# Patient Record
Sex: Male | Born: 1965 | Race: Black or African American | Hispanic: No | Marital: Married | Smoking: Current every day smoker
Health system: Southern US, Community
[De-identification: ages and names within clinical notes are randomized; demographics above are authoritative.]

## PROBLEM LIST (undated history)

## (undated) DIAGNOSIS — J189 Pneumonia, unspecified organism: Secondary | ICD-10-CM

## (undated) DIAGNOSIS — F1010 Alcohol abuse, uncomplicated: Secondary | ICD-10-CM

## (undated) DIAGNOSIS — J449 Chronic obstructive pulmonary disease, unspecified: Secondary | ICD-10-CM

## (undated) DIAGNOSIS — E079 Disorder of thyroid, unspecified: Secondary | ICD-10-CM

## (undated) DIAGNOSIS — J942 Hemothorax: Secondary | ICD-10-CM

## (undated) DIAGNOSIS — I10 Essential (primary) hypertension: Secondary | ICD-10-CM

## (undated) DIAGNOSIS — I639 Cerebral infarction, unspecified: Secondary | ICD-10-CM

## (undated) DIAGNOSIS — I619 Nontraumatic intracerebral hemorrhage, unspecified: Secondary | ICD-10-CM

---

## 2001-02-03 ENCOUNTER — Inpatient Hospital Stay (HOSPITAL_COMMUNITY): Admission: EM | Admit: 2001-02-03 | Discharge: 2001-03-31 | Payer: Self-pay | Admitting: Emergency Medicine

## 2001-02-04 ENCOUNTER — Encounter: Payer: Self-pay | Admitting: General Surgery

## 2001-02-05 ENCOUNTER — Encounter: Payer: Self-pay | Admitting: Internal Medicine

## 2001-02-07 ENCOUNTER — Encounter: Payer: Self-pay | Admitting: General Surgery

## 2001-02-09 ENCOUNTER — Encounter: Payer: Self-pay | Admitting: General Surgery

## 2001-02-10 ENCOUNTER — Encounter: Payer: Self-pay | Admitting: Internal Medicine

## 2001-02-10 ENCOUNTER — Encounter: Payer: Self-pay | Admitting: Family Medicine

## 2001-02-12 ENCOUNTER — Encounter: Payer: Self-pay | Admitting: General Surgery

## 2001-02-16 ENCOUNTER — Encounter: Payer: Self-pay | Admitting: Internal Medicine

## 2001-02-17 ENCOUNTER — Encounter: Payer: Self-pay | Admitting: Internal Medicine

## 2001-02-18 ENCOUNTER — Encounter: Payer: Self-pay | Admitting: General Surgery

## 2001-02-19 ENCOUNTER — Encounter: Payer: Self-pay | Admitting: Family Medicine

## 2001-02-22 ENCOUNTER — Encounter: Payer: Self-pay | Admitting: Internal Medicine

## 2001-02-27 ENCOUNTER — Encounter: Payer: Self-pay | Admitting: Internal Medicine

## 2001-02-27 ENCOUNTER — Encounter: Payer: Self-pay | Admitting: Family Medicine

## 2001-02-28 ENCOUNTER — Encounter: Payer: Self-pay | Admitting: General Surgery

## 2001-03-01 ENCOUNTER — Encounter: Payer: Self-pay | Admitting: Nephrology

## 2001-03-02 ENCOUNTER — Encounter: Payer: Self-pay | Admitting: Internal Medicine

## 2001-03-03 ENCOUNTER — Encounter: Payer: Self-pay | Admitting: Family Medicine

## 2001-03-05 ENCOUNTER — Encounter: Payer: Self-pay | Admitting: General Surgery

## 2001-03-06 ENCOUNTER — Encounter: Payer: Self-pay | Admitting: General Surgery

## 2001-03-09 ENCOUNTER — Encounter: Payer: Self-pay | Admitting: Internal Medicine

## 2001-03-12 ENCOUNTER — Encounter: Payer: Self-pay | Admitting: Family Medicine

## 2001-03-16 ENCOUNTER — Encounter: Payer: Self-pay | Admitting: Internal Medicine

## 2001-03-19 ENCOUNTER — Encounter: Payer: Self-pay | Admitting: General Surgery

## 2001-03-21 ENCOUNTER — Encounter: Payer: Self-pay | Admitting: Family Medicine

## 2001-03-27 ENCOUNTER — Encounter: Payer: Self-pay | Admitting: Family Medicine

## 2001-03-31 ENCOUNTER — Encounter: Payer: Self-pay | Admitting: Pulmonary Disease

## 2001-03-31 ENCOUNTER — Encounter: Payer: Self-pay | Admitting: General Surgery

## 2001-03-31 ENCOUNTER — Inpatient Hospital Stay (HOSPITAL_COMMUNITY): Admission: EM | Admit: 2001-03-31 | Discharge: 2001-04-11 | Payer: Self-pay | Admitting: Pulmonary Disease

## 2001-04-01 ENCOUNTER — Encounter: Payer: Self-pay | Admitting: Pulmonary Disease

## 2001-04-02 ENCOUNTER — Encounter: Payer: Self-pay | Admitting: Pulmonary Disease

## 2001-04-03 ENCOUNTER — Encounter: Payer: Self-pay | Admitting: Pulmonary Disease

## 2001-04-11 ENCOUNTER — Inpatient Hospital Stay (HOSPITAL_COMMUNITY)
Admission: RE | Admit: 2001-04-11 | Discharge: 2001-04-15 | Payer: Self-pay | Admitting: Physical Medicine and Rehabilitation

## 2001-04-11 ENCOUNTER — Encounter: Payer: Self-pay | Admitting: Physical Medicine and Rehabilitation

## 2001-04-13 ENCOUNTER — Encounter: Payer: Self-pay | Admitting: Physical Medicine and Rehabilitation

## 2001-07-12 ENCOUNTER — Encounter: Payer: Self-pay | Admitting: General Surgery

## 2001-07-12 ENCOUNTER — Ambulatory Visit (HOSPITAL_COMMUNITY): Admission: RE | Admit: 2001-07-12 | Discharge: 2001-07-12 | Payer: Self-pay | Admitting: General Surgery

## 2003-08-22 ENCOUNTER — Emergency Department (HOSPITAL_COMMUNITY): Admission: EM | Admit: 2003-08-22 | Discharge: 2003-08-23 | Payer: Self-pay | Admitting: Emergency Medicine

## 2004-05-31 ENCOUNTER — Emergency Department (HOSPITAL_COMMUNITY): Admission: EM | Admit: 2004-05-31 | Discharge: 2004-05-31 | Payer: Self-pay | Admitting: *Deleted

## 2005-09-20 DIAGNOSIS — I639 Cerebral infarction, unspecified: Secondary | ICD-10-CM

## 2005-09-20 HISTORY — DX: Cerebral infarction, unspecified: I63.9

## 2006-03-30 ENCOUNTER — Emergency Department (HOSPITAL_COMMUNITY): Admission: EM | Admit: 2006-03-30 | Discharge: 2006-03-30 | Payer: Self-pay | Admitting: Emergency Medicine

## 2006-07-03 ENCOUNTER — Emergency Department (HOSPITAL_COMMUNITY): Admission: EM | Admit: 2006-07-03 | Discharge: 2006-07-03 | Payer: Self-pay | Admitting: *Deleted

## 2007-06-27 ENCOUNTER — Emergency Department (HOSPITAL_COMMUNITY): Admission: EM | Admit: 2007-06-27 | Discharge: 2007-06-28 | Payer: Self-pay | Admitting: Emergency Medicine

## 2007-08-12 ENCOUNTER — Emergency Department (HOSPITAL_COMMUNITY): Admission: EM | Admit: 2007-08-12 | Discharge: 2007-08-12 | Payer: Self-pay | Admitting: Emergency Medicine

## 2009-11-08 ENCOUNTER — Emergency Department (HOSPITAL_COMMUNITY): Admission: EM | Admit: 2009-11-08 | Discharge: 2009-11-08 | Payer: Self-pay | Admitting: Emergency Medicine

## 2010-02-06 ENCOUNTER — Emergency Department (HOSPITAL_COMMUNITY): Admission: EM | Admit: 2010-02-06 | Discharge: 2010-02-06 | Payer: Self-pay | Admitting: Emergency Medicine

## 2010-12-07 LAB — POCT CARDIAC MARKERS
CKMB, poc: 1 ng/mL (ref 1.0–8.0)
Myoglobin, poc: 33.9 ng/mL (ref 12–200)
Troponin i, poc: <0.05 ng/mL (ref 0.00–0.09)

## 2010-12-07 LAB — BASIC METABOLIC PANEL
BUN: 12 mg/dL (ref 6–23)
CO2: 20 mEq/L (ref 19–32)
Calcium: 8.7 mg/dL (ref 8.4–10.5)
Chloride: 103 mEq/L (ref 96–112)
Creatinine, Ser: 0.97 mg/dL (ref 0.4–1.5)
GFR calc Af Amer: >60 mL/min (ref >60)
GFR calc non Af Amer: >60 mL/min (ref >60)
Glucose, Bld: 99 mg/dL (ref 70–99)
Potassium: 3.9 mEq/L (ref 3.5–5.1)
Sodium: 133 mEq/L — ABNORMAL LOW (ref 135–145)

## 2010-12-07 LAB — ETHANOL: Alcohol, Ethyl (B): 284 mg/dL — ABNORMAL HIGH (ref 0–10)

## 2010-12-07 LAB — DIFFERENTIAL
Basophils Absolute: 0.2 10*3/uL — ABNORMAL HIGH (ref 0.0–0.1)
Basophils Relative: 2 % — ABNORMAL HIGH (ref 0–1)
Eosinophils Absolute: 0.2 10*3/uL (ref 0.0–0.7)
Eosinophils Relative: 2 % (ref 0–5)
Lymphocytes Relative: 58 % — ABNORMAL HIGH (ref 12–46)
Lymphs Abs: 5.1 10*3/uL — ABNORMAL HIGH (ref 0.7–4.0)
Monocytes Absolute: 0.6 10*3/uL (ref 0.1–1.0)
Monocytes Relative: 7 % (ref 3–12)
Neutro Abs: 2.7 10*3/uL (ref 1.7–7.7)
Neutrophils Relative %: 31 % — ABNORMAL LOW (ref 43–77)

## 2010-12-07 LAB — CBC
HCT: 44.9 % (ref 39.0–52.0)
Hemoglobin: 16.3 g/dL (ref 13.0–17.0)
MCHC: 36.4 g/dL — ABNORMAL HIGH (ref 30.0–36.0)
MCV: 101 fL — ABNORMAL HIGH (ref 78.0–100.0)
Platelets: 165 10*3/uL (ref 150–400)
RBC: 4.44 MIL/uL (ref 4.22–5.81)
RDW: 15.1 % (ref 11.5–15.5)
WBC: 8.8 10*3/uL (ref 4.0–10.5)

## 2011-01-11 ENCOUNTER — Emergency Department (HOSPITAL_COMMUNITY)
Admission: EM | Admit: 2011-01-11 | Discharge: 2011-01-11 | Disposition: A | Discharge status: Home / Self Care | Payer: Self-pay | Attending: Emergency Medicine | Admitting: Emergency Medicine

## 2011-01-11 ENCOUNTER — Emergency Department (HOSPITAL_COMMUNITY): Admission: EM | Admit: 2011-01-11 | Payer: Self-pay | Source: Home / Self Care

## 2011-01-11 DIAGNOSIS — R040 Epistaxis: Secondary | ICD-10-CM | POA: Insufficient documentation

## 2011-01-11 DIAGNOSIS — I10 Essential (primary) hypertension: Secondary | ICD-10-CM | POA: Insufficient documentation

## 2011-02-05 NOTE — Procedures (Signed)
NAME:  Rick Williams, Rick Williams                      ACCOUNT NO.:  [id number]   MEDICAL RECORD NO.:  [id number]                   PATIENT TYPE:  EMS   LOCATION:  ED                                   FACILITY:  [facility]   PHYSICIAN:  Edward L. Juanetta Gosling, M.D.             DATE OF BIRTH:  [birth year]   DATE OF PROCEDURE:  08/22/2003  DATE OF DISCHARGE:  08/23/2003                                EKG INTERPRETATION   FINDINGS:  1. The rhythm is sinus rhythm with probably a PVC versus artifact.  2. There is left atrial enlargement.  3. There is left ventricular hypertrophy.   IMPRESSION:  Abnormal electrocardiogram.      ___________________________________________                                            Oneal Deputy. Juanetta Gosling, M.D.   ELH/MEDQ  D:  08/23/2003  T:  08/23/2003  Job:  [id number]

## 2011-02-05 NOTE — Discharge Summary (Signed)
[place]  Patient:    Rick Williams, Rick Williams                   MRN: [id number] Adm. Date:  [id number] Disc. Date: [id number] Attending:  Silvio Pate Dictator:   Junie Bame, P.A. CC:         Rick Williams. Rick Williams, M.D. [facility]   Discharge Summary  DISCHARGE DIAGNOSES: 1. Adult respiratory distress syndrome/systemic inflammatory response    syndrome. 2. Hepatic alcoholic cirrhosis. 3. Hypertension. 4. Alcoholism. 5. Anemia. 6. Status post tracheotomy. 7. Status post methicillin resistant Staphylococcus aureus pneumonia. 8. Status post mild hyponatremia.  HISTORY OF PRESENT ILLNESS:  The patient is a 45 year old African-American male originally admitted at [facility] on Feb 03, 2001, for MRSA pneumonia.  Medical complications at [facility] include ARDS/SIRS and alcoholic hepatitis.  The patient was transferred to [facility] on March 31, 2001, for ventilation management. The patient was seen by Rick Williams, M.D., weaned to trach collar on April 06, 2001.  Hospital course at [facility] significant for anemia, insomnia, agitation, and malnutrition.  Presently, the trach has been removed and he is hemodynamically stable.  The PT report indicated that the patient was ambulating minimum assist approximately 50 feet x 2 or hand-held assist and transfer sit to stand for supervision.  Echocardiogram revealed the patient has a 35 to 45% ejection fraction.  The patient has completed a course of vancomycin for 28 days for MRSA pneumonia.  PAST MEDICAL HISTORY:  This is significant for: 1. Hepatic alcoholic cirrhosis. 2. Alcoholic cardiomegaly. 3. Hypertension.  PAST SURGICAL HISTORY:  None.  MEDICATIONS PRIOR TO ADMISSION:  None.  FAMILY HISTORY:  Noncontributory.  SOCIAL HISTORY:  The patient lives in [place], [place], in a [place] home with father.  He is unemployed.  There  is no steps to entry. There is a positive alcohol history and positive tobacco history.  PRIMARY CARE Rick Williams:  Dr. Elpidio Williams.  HOSPITAL COURSE:  The patient was admitted to rehab on April 11, 2001, at [facility] where he received more than three hours of PT and OT daily.  Rick Williams progressed very well during his physical therapy and occupational therapy.  His main concern during his hospital stay in rehab was his respiratory condition.  His hospital course is significant for mild hyponatremia, anemia, and desaturations on oxygen.  The patient was consulted by neuropsychologist, Rick Williams, M.D., for counseling on alcoholism and tobacco abuse.  During his entire stay in rehab, he remained on Atrovent and Ventolin and prednisone.  The patient remained on oxygen during his entire stay in rehab.  He began to desaturate on April 13, 2001, to below 90 on one to two liters on room air.  Repeat chest x-rays were performed on the patient on April 11, 2001, which demonstrated small bilateral effusions and a 3 cm masslike opacity located on the right hemidiaphragm. Therefore, a chest CT was performed to evaluate the meds.  Chest CT was negative for pulmonary carcinoma or no evidence of pulmonary mass.  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  The patient was consulted by the pulmonologist, Rick Williams, M.D., who indicated to leave the patient on two liters of oxygen and increase to four liters if necessary if sats dropped below 90.  Dr. Javier Williams the  patient to be discharged to home.  His trach site was completely healed and trach site care was performed by R.N. daily.  He remained on ambien for insomnia and Klonopin for anxiety.  His blood pressure remained fairly stable on Maxzide and clonidine during his entire stay.  His hospital course was also significant for mild  hyponatremia.  No medical interventions were necessary at that time.  Latest labs indicated that his sodium was 130, potassium 4.4, glucose 100, BUN was 27, creatinine 1.6.  Hemoglobin 10.7, hematocrit 30.9, white blood cell count 15.4, platelet count was 609.  ABGs on April 13, 2001, indicated his pO2 was 51.3, oxygen saturation was 87.4, pH 7.439.  His ALT was 88, AST 46, and alkaline phosphatase was 170.  At the time of discharge, the patients blood pressure was 140/60, temperature 99.7, pulse 98, and respiratory rate 20.  On two liters of oxygen he was at 97%.  Physical therapy report at that time indicated the patient was ambulating independently with oxygen greater than 100 feet and could perform most ADLs with modified independence.  The patient was discharged home with girlfriend.  DISCHARGE MEDICATIONS: 1. Prednisone 10 mg one tablet daily. 2. Clonidine 0.2 mg one tablet every eight hours. 3. Maxzide 25 mg one tablet daily. 4. Klonopin 1 mg one tablet at night. 5. Beconase 42 mcg one puff twice daily. 6. Loniten 10 mg one tablet twice a day.  ACTIVITY:  The patient is to use oxygen two to four liters.  No smoking.  No drinking.  WOUND CARE:  Rick Williams site will be cleaned with half hydrogen peroxide and half water. Clean site twice daily and apply Band-Aid.  FOLLOW-UP:  He will have home health nursing x 2 visits next week to check trach site and pulmonary function.  He is to follow up with Dr. Sherene Williams, the pulmonologist, in two weeks, follow up with Dr. Elpidio Williams to monitor his hyponatremia within four weeks, follow up with Rick Williams, M.D., as needed.  DD:  04/23/01 TD:  04/25/01 Job: [id number]

## 2011-02-05 NOTE — Discharge Summary (Signed)
[facility]  Patient:    Rick Williams, Rick Williams                   MRN: [id number] Adm. Date:  [id number] Disc. Date: [id number] Attending:  Evern Core Dictator:   Earley Favor, R.N., MSN, ACNP CC:         Elpidio Anis, M.D. - [facility]   Discharge Summary  DATE OF BIRTH:  1966/08/23  DISCHARGE DIAGNOSES: 1. Vent-dependent respiratory failure, secondary to acute respiratory    distress syndrome. 2. Hypertension. 3. Debilitation.  HISTORY OF PRESENT ILLNESS:  Rick Williams is a 45 year old African-American male transferred from [facility] for vent management.  The patient was admitted on Feb 03, 2001, to [facility] for pneumonia.  The course has been complicated by acute respiratory distress syndrome, systemic inflammatory response syndrome, along with multiorgan dysfunction syndrome, along with ETOH hepatitis. In addition, the patient has had delirium tremens while at [facility]. He had a vent methacillin-resistant Staphylococcus aureus, ventilation-associated pneumonia, and required 28 days on vancomycin plus five days of rifampin.  He was intubated from Feb 05, 2001, following a code blue.  He was noted to have a left subclavian central line since February 28, 2001.  He was transferred to [facility] on March 31, 2001, for attempts at ventilatory liberation. Of note, he had developed a fibroproliferative respiratory distress syndrome, and required high PEEP and flow pressures while at [facility].  LABORATORY DATA:  Chest x-ray on April 10, 2001, showed interval improvement in aeration with less air space disease than noted.  Swallowing examination on April 07, 2001, demonstrated no aspiration or penetration.  Other procedures were a central line placement on April 01, 2001, and downsizing  from a #8 to a #6 metal trach, and decanalization on April 09, 2001.  HOSPITAL  COURSE: #1 - VENT-DEPENDENT RESPIRATORY FAILURE, SECONDARY TO ACUTE RESPIRATORY DISTRESS SYNDROME:  The patient was transferred to [facility] via [facility] on March 31, 2001.  His ventilator settings were adjusted immediately on arrival.  His FIO2 was decreased.  His peak inspiratory pressures maintained in a normal level.  He no longer required high  levels of positive end-expiratory pressure, and was successfully liberated from the ventilator, and discharged to the rehab setting by April 11, 2001.  Of note, he required O2 with O2 saturations noted to be 84% on room air.  His hospital course in the progressive pulmonary unit at [facility] was uneventful, and he quickly was weaned from the mechanical ventilator support.  #2 - HYPERTENSION:  The hypertension was treated with a multitude of medications, with the last medications consisting of Maxzide 25 mg one p.o. q.d. and minoxidil 10 mg p.o. b.i.d.  With these medications his blood pressure remained in acceptable levels.  #3 - DEBILITATION, SECONDARY TO PROLONGED ILLNESS:  While in the progressive unit, physical therapy and occupational therapy were instituted, and he was transferred to the rehabilitation center at [facility] on April 11, 2001, or further strengthening.  DISCHARGE MEDICATIONS: 1. Minoxidil 10 mg p.o. b.i.d. 2. Prednisone 10 mg q.a.m. 3. Maxzide 25 mg p.o. q.d. 4. Lorazepam 1 mg p.o. q.p.m. 5. He required O2 at 2 L nasal cannula, to maintain O2 saturations at    90% or greater.  DIET:  A regular diet.  DISPOSITION/CONDITION ON DISCHARGE:  His acute respiratory distress requiring mechanical  and ventilatory support was resolved.  He remains O2-dependent.  His  hypertension has been addressed with the appropriate medications.  His debilitation is being addressed with rehabilitation services.  Of note, he has a  long history of tobacco and alcohol abuse.  Reportedly he is going to refrain from these in the  future.  He was discharged to rehabilitation in an improved condition.DD: 04/17/01 TD:  04/17/01 Job: [id number]

## 2011-06-29 LAB — DIFFERENTIAL
Basophils Absolute: 0
Basophils Relative: 0
Eosinophils Absolute: 0 — ABNORMAL LOW
Eosinophils Relative: 0
Lymphocytes Relative: 9 — ABNORMAL LOW
Lymphs Abs: 1.8
Monocytes Absolute: 0.7
Monocytes Relative: 3
Neutro Abs: 18.8 — ABNORMAL HIGH
Neutrophils Relative %: 88 — ABNORMAL HIGH

## 2011-06-29 LAB — CBC
HCT: 40.8
Hemoglobin: 13.9
MCHC: 34.1
MCV: 106.5 — ABNORMAL HIGH
Platelets: 128 — ABNORMAL LOW
RBC: 3.83 — ABNORMAL LOW
RDW: 14.4
WBC: 21.3 — ABNORMAL HIGH

## 2011-06-29 LAB — ETHANOL: Alcohol, Ethyl (B): <5

## 2011-07-01 LAB — BASIC METABOLIC PANEL
BUN: 7
CO2: 27
Calcium: 8.4
Chloride: 102
Creatinine, Ser: 1.11
GFR calc Af Amer: >60
GFR calc non Af Amer: >60
Glucose, Bld: 100 — ABNORMAL HIGH
Potassium: 3.6
Sodium: 137

## 2011-07-01 LAB — CBC
HCT: 34.7 — ABNORMAL LOW
Hemoglobin: 12.2 — ABNORMAL LOW
MCHC: 35
MCV: 101.7 — ABNORMAL HIGH
Platelets: 150
RBC: 3.41 — ABNORMAL LOW
RDW: 13.9
WBC: 7.7

## 2011-07-01 LAB — DIFFERENTIAL
Basophils Absolute: 0.1
Basophils Relative: 1
Eosinophils Absolute: 0.1
Eosinophils Relative: 2
Lymphocytes Relative: 61 — ABNORMAL HIGH
Lymphs Abs: 4.7 — ABNORMAL HIGH
Monocytes Absolute: 0.5
Monocytes Relative: 6
Neutro Abs: 2.3
Neutrophils Relative %: 30 — ABNORMAL LOW

## 2012-05-15 ENCOUNTER — Encounter (HOSPITAL_COMMUNITY): Payer: Self-pay | Admitting: *Deleted

## 2012-05-15 ENCOUNTER — Emergency Department (HOSPITAL_COMMUNITY): Payer: Self-pay

## 2012-05-15 ENCOUNTER — Emergency Department (HOSPITAL_COMMUNITY)
Admission: EM | Admit: 2012-05-15 | Discharge: 2012-05-15 | Disposition: A | Discharge status: Home / Self Care | Payer: Self-pay | Attending: Emergency Medicine | Admitting: Emergency Medicine

## 2012-05-15 DIAGNOSIS — I10 Essential (primary) hypertension: Secondary | ICD-10-CM | POA: Insufficient documentation

## 2012-05-15 DIAGNOSIS — M79609 Pain in unspecified limb: Secondary | ICD-10-CM | POA: Insufficient documentation

## 2012-05-15 DIAGNOSIS — I160 Hypertensive urgency: Secondary | ICD-10-CM

## 2012-05-15 DIAGNOSIS — F17200 Nicotine dependence, unspecified, uncomplicated: Secondary | ICD-10-CM | POA: Insufficient documentation

## 2012-05-15 HISTORY — DX: Essential (primary) hypertension: I10

## 2012-05-15 HISTORY — DX: Hemothorax: J94.2

## 2012-05-15 HISTORY — DX: Pneumonia, unspecified organism: J18.9

## 2012-05-15 LAB — COMPREHENSIVE METABOLIC PANEL
ALT: 81 U/L — ABNORMAL HIGH (ref 0–53)
AST: 133 U/L — ABNORMAL HIGH (ref 0–37)
Albumin: 3.6 g/dL (ref 3.5–5.2)
Alkaline Phosphatase: 73 U/L (ref 39–117)
BUN: 8 mg/dL (ref 6–23)
CO2: 27 mEq/L (ref 19–32)
Calcium: 9.2 mg/dL (ref 8.4–10.5)
Chloride: 99 mEq/L (ref 96–112)
Creatinine, Ser: 0.98 mg/dL (ref 0.50–1.35)
GFR calc Af Amer: >90 mL/min (ref >90)
GFR calc non Af Amer: >90 mL/min (ref >90)
Glucose, Bld: 81 mg/dL (ref 70–99)
Potassium: 4.7 mEq/L (ref 3.5–5.1)
Sodium: 137 mEq/L (ref 135–145)
Total Bilirubin: 0.6 mg/dL (ref 0.3–1.2)
Total Protein: 8.7 g/dL — ABNORMAL HIGH (ref 6.0–8.3)

## 2012-05-15 LAB — CBC WITH DIFFERENTIAL/PLATELET
Basophils Absolute: 0 10*3/uL (ref 0.0–0.1)
Basophils Relative: 0 % (ref 0–1)
Eosinophils Absolute: 0.1 10*3/uL (ref 0.0–0.7)
Eosinophils Relative: 1 % (ref 0–5)
HCT: 43.4 % (ref 39.0–52.0)
Hemoglobin: 15.5 g/dL (ref 13.0–17.0)
Lymphocytes Relative: 41 % (ref 12–46)
Lymphs Abs: 2.8 10*3/uL (ref 0.7–4.0)
MCH: 35.6 pg — ABNORMAL HIGH (ref 26.0–34.0)
MCHC: 35.7 g/dL (ref 30.0–36.0)
MCV: 99.5 fL (ref 78.0–100.0)
Monocytes Absolute: 0.5 10*3/uL (ref 0.1–1.0)
Monocytes Relative: 7 % (ref 3–12)
Neutro Abs: 3.4 10*3/uL (ref 1.7–7.7)
Neutrophils Relative %: 51 % (ref 43–77)
Platelets: 100 10*3/uL — ABNORMAL LOW (ref 150–400)
RBC: 4.36 MIL/uL (ref 4.22–5.81)
RDW: 13.1 % (ref 11.5–15.5)
WBC: 6.7 10*3/uL (ref 4.0–10.5)

## 2012-05-15 LAB — TROPONIN I: Troponin I: <0.3 ng/mL (ref <0.30)

## 2012-05-15 MED ORDER — METOPROLOL TARTRATE 100 MG PO TABS: 50.0000 mg | ORAL_TABLET | Freq: Two times a day (BID) | ORAL | Status: DC

## 2012-05-15 MED ORDER — METOPROLOL TARTRATE 50 MG PO TABS
50.0000 mg | ORAL_TABLET | Freq: Once | ORAL | Status: AC
  Administered 2012-05-15: 50 mg via ORAL
  Filled 2012-05-15: qty 1

## 2012-05-15 NOTE — ED Provider Notes (Signed)
History  This chart was scribed for Rick Lyons, MD by Shari Heritage. The patient was seen in room APA15/APA15. Patient's care was started at 1204.     CSN: [id number]  Arrival date & time 05/15/12  1204   First MD Initiated Contact with Patient 05/15/12 1314      Chief Complaint  Patient presents with  . Hypertension  . Arm Pain    The history is provided by the patient. No language interpreter was used.   Rick Williams is a 46 y.o. male who presents to the Emergency Department complaining of left arm pain and hypertension onset 3 days ago. Patient says a friend measured his BP for him yesterday and it was 193/112. He was taking Clonidine to for HTN, but he lost his job and hasn't been able to afford his medication. Patient denies any SOB, nausea or vomiting. Patient lives on [address] in [place]. Patient says that he has an 4-month hospitalization history after complications from pneumonia including pneumothorax. He is a current everyday smoker.  Past Medical History  Diagnosis Date  . Hypertension   . Pneumonia   . Hemothorax on left     History reviewed. No pertinent past surgical history.  History reviewed. No pertinent family history.  History  Substance Use Topics  . Smoking status: Current Everyday Smoker -- 1.0 packs/day    Types: Cigarettes  . Smokeless tobacco: Not on file  . Alcohol Use: Yes      Review of Systems  Respiratory: Negative for shortness of breath.   Gastrointestinal: Negative for nausea and vomiting.  Musculoskeletal: Positive for myalgias.  All other systems reviewed and are negative.    Allergies  Benadryl  Home Medications  No current outpatient prescriptions on file.  BP 164/114  Pulse 85  Temp 98.6 F (37 C) (Oral)  Resp 20  Ht 5\' 11"  (1.803 m)  Wt 172 lb (78.019 kg)  BMI 23.99 kg/m2  SpO2 100%  Physical Exam  Constitutional: He is oriented to person, place, and time. He appears well-developed and well-nourished.    HENT:  Head: Normocephalic and atraumatic.       Has a trach.  Cardiovascular: Normal rate and regular rhythm.   No murmur heard. Pulmonary/Chest: Effort normal and breath sounds normal. No respiratory distress. He has no wheezes. He has no rales. He exhibits no tenderness.  Abdominal: Soft. There is no tenderness. There is no rebound, no guarding and no CVA tenderness.  Musculoskeletal: Normal range of motion. He exhibits no edema (No lower extremity edema).       Cervical back: He exhibits no tenderness and no bony tenderness.       Thoracic back: He exhibits no tenderness and no bony tenderness.       Lumbar back: He exhibits no tenderness and no bony tenderness.  Neurological: He is alert and oriented to person, place, and time.  Psychiatric: He has a normal mood and affect. His behavior is normal.    ED Course  Procedures (including critical care time) DIAGNOSTIC STUDIES: Oxygen Saturation is 100% on room air, normal by my interpretation.    COORDINATION OF CARE: 1:19pm- Patient informed of current plan for treatment and evaluation and agrees with plan at this time.   2:43pm- Updated patient on lab results, no acute conditions suspected. Will discharge patient with prescription for beta blockers.  Results for orders placed during the hospital encounter of 05/15/12  CBC WITH DIFFERENTIAL      Component Value  Range   WBC 6.7  4.0 - 10.5 K/uL   RBC 4.36  4.22 - 5.81 MIL/uL   Hemoglobin 15.5  13.0 - 17.0 g/dL   HCT 16.1  09.6 - 04.5 %   MCV 99.5  78.0 - 100.0 fL   MCH 35.6 (*) 26.0 - 34.0 pg   MCHC 35.7  30.0 - 36.0 g/dL   RDW 40.9  81.1 - 91.4 %   Platelets 100 (*) 150 - 400 K/uL   Neutrophils Relative 51  43 - 77 %   Neutro Abs 3.4  1.7 - 7.7 K/uL   Lymphocytes Relative 41  12 - 46 %   Lymphs Abs 2.8  0.7 - 4.0 K/uL   Monocytes Relative 7  3 - 12 %   Monocytes Absolute 0.5  0.1 - 1.0 K/uL   Eosinophils Relative 1  0 - 5 %   Eosinophils Absolute 0.1  0.0 - 0.7 K/uL    Basophils Relative 0  0 - 1 %   Basophils Absolute 0.0  0.0 - 0.1 K/uL  COMPREHENSIVE METABOLIC PANEL      Component Value Range   Sodium 137  135 - 145 mEq/L   Potassium 4.7  3.5 - 5.1 mEq/L   Chloride 99  96 - 112 mEq/L   CO2 27  19 - 32 mEq/L   Glucose, Bld 81  70 - 99 mg/dL   BUN 8  6 - 23 mg/dL   Creatinine, Ser 7.82  0.50 - 1.35 mg/dL   Calcium 9.2  8.4 - 95.6 mg/dL   Total Protein 8.7 (*) 6.0 - 8.3 g/dL   Albumin 3.6  3.5 - 5.2 g/dL   AST 213 (*) 0 - 37 U/L   ALT 81 (*) 0 - 53 U/L   Alkaline Phosphatase 73  39 - 117 U/L   Total Bilirubin 0.6  0.3 - 1.2 mg/dL   GFR calc non Af Amer >90  >90 mL/min   GFR calc Af Amer >90  >90 mL/min  TROPONIN I      Component Value Range   Troponin I <0.30  <0.30 ng/mL   Dg Chest Port 1 View  05/15/2012  *RADIOLOGY REPORT*  Clinical Data: High blood pressure, left arm pain  PORTABLE CHEST - 1 VIEW  Comparison: Feb 06, 2010  Findings: There is no focal infiltrate, pulmonary edema, or pleural effusion.  The mediastinal contour and cardiac silhouette are normal.  The soft tissue osseous structures are normal.  IMPRESSION: No acute cardiopulmonary disease identified.   Original Report Authenticated By: Sherian Rein, M.D.     No diagnosis found.   Date: 05/15/2012  Rate: 74  Rhythm: normal sinus rhythm  QRS Axis: normal  Intervals: normal  ST/T Wave abnormalities: nonspecific T wave changes  Conduction Disutrbances:none  Narrative Interpretation:   Old EKG Reviewed: unchanged    MDM  The patient presents with elevated bp, left arm discomfort, but no chest pain.  The ekg shows st changes that are unchanged from prior studies and troponin is negative.  He was given a dose of metoprolol while here and his bp is much improved.  He is feeling better and there is no evidence of end organ damage.  He will be started on metoprolol as he cannot afford the clonidine that he usually takes.  He was advised to attempt to make a follow up appointment  with a pcp.        I personally performed the services described in  this documentation, which was scribed in my presence. The recorded information has been reviewed and considered.      Rick Lyons, MD 05/15/12 [phone]

## 2012-05-15 NOTE — ED Notes (Signed)
Pt states he CP off and on, denies CP at this time

## 2012-05-15 NOTE — ED Notes (Signed)
Pt with HTN and left arm pain  That started 3 days ago, denies SOB or N/V

## 2012-06-14 ENCOUNTER — Encounter (HOSPITAL_COMMUNITY): Payer: Self-pay | Admitting: Emergency Medicine

## 2012-06-14 ENCOUNTER — Emergency Department (HOSPITAL_COMMUNITY)
Admission: EM | Admit: 2012-06-14 | Discharge: 2012-06-14 | Disposition: A | Discharge status: Home / Self Care | Payer: Self-pay | Attending: Emergency Medicine | Admitting: Emergency Medicine

## 2012-06-14 DIAGNOSIS — K029 Dental caries, unspecified: Secondary | ICD-10-CM | POA: Insufficient documentation

## 2012-06-14 DIAGNOSIS — K0889 Other specified disorders of teeth and supporting structures: Secondary | ICD-10-CM

## 2012-06-14 DIAGNOSIS — F17200 Nicotine dependence, unspecified, uncomplicated: Secondary | ICD-10-CM | POA: Insufficient documentation

## 2012-06-14 DIAGNOSIS — I10 Essential (primary) hypertension: Secondary | ICD-10-CM | POA: Insufficient documentation

## 2012-06-14 MED ORDER — HYDROCODONE-ACETAMINOPHEN 5-325 MG PO TABS: 1.0000 | ORAL_TABLET | ORAL | Status: DC | PRN

## 2012-06-14 MED ORDER — PENICILLIN V POTASSIUM 500 MG PO TABS: 500.0000 mg | ORAL_TABLET | Freq: Four times a day (QID) | ORAL | Status: AC

## 2012-06-14 MED ORDER — HYDROCODONE-ACETAMINOPHEN 5-325 MG PO TABS
1.0000 | ORAL_TABLET | Freq: Once | ORAL | Status: AC
  Administered 2012-06-14: 1 via ORAL
  Filled 2012-06-14: qty 1

## 2012-06-14 MED ORDER — PENICILLIN V POTASSIUM 250 MG PO TABS
500.0000 mg | ORAL_TABLET | Freq: Once | ORAL | Status: AC
  Administered 2012-06-14: 500 mg via ORAL
  Filled 2012-06-14: qty 2

## 2012-06-14 NOTE — ED Provider Notes (Signed)
History   This chart was scribed for Carleene Cooper III, MD by Gerlean Ren. This patient was seen in room APA03/APA03 and the patient's care was started at 8:02AM.   CSN: [id number]  Arrival date & time 06/14/12  4098   None     Chief Complaint  Patient presents with  . Dental Pain    (Consider location/radiation/quality/duration/timing/severity/associated sxs/prior treatment) Patient is a 47 y.o. male presenting with tooth pain. The history is provided by the patient. No language interpreter was used.  Dental Pain  Rick Williams is a 46 y.o. male who presents to the Emergency Department complaining of constant, sharp, gradually worsening, non-radiating dental pain in lower right side with sudden onset 3 days ago.  Pt reports associated right cheek swelling.  Pt denies any radiating tingling or numbness.  Pt denies fever.  Pt has a h/o HTN.  Pt is a current everyday smoker (0.5pack/day) and reports alcohol use.    Past Medical History  Diagnosis Date  . Hypertension   . Pneumonia   . Hemothorax on left     History reviewed. No pertinent past surgical history.  No family history on file.  History  Substance Use Topics  . Smoking status: Current Every Day Smoker -- 1.0 packs/day    Types: Cigarettes  . Smokeless tobacco: Not on file  . Alcohol Use: Yes      Review of Systems  All other systems reviewed and are negative.    Allergies  Benadryl  Home Medications   Current Outpatient Rx  Name Route Sig Dispense Refill  . METOPROLOL TARTRATE 100 MG PO TABS Oral Take 0.5 tablets (50 mg total) by mouth 2 (two) times daily. 60 tablet 3    BP 184/122  Pulse 107  Temp 98.4 F (36.9 C) (Oral)  Resp 20  Ht 5' 10.5" (1.791 m)  Wt 165 lb (74.844 kg)  BMI 23.34 kg/m2  SpO2 100%  Physical Exam  Nursing note and vitals reviewed. Constitutional: He is oriented to person, place, and time. He appears well-developed.  HENT:  Head: Normocephalic and atraumatic.    Mouth/Throat: Oropharynx is clear and moist.       Right lower first molar decayed down to gumline.  Gingiva surrounding tooth swollen.  Right cheek swollen.    Eyes: Conjunctivae normal and EOM are normal. Pupils are equal, round, and reactive to light.  Neck: Normal range of motion. Neck supple. No tracheal deviation present.       Right cervical adenopathy.  Cardiovascular: Normal rate, regular rhythm and normal heart sounds.   No murmur heard. Pulmonary/Chest: Effort normal and breath sounds normal. He has no wheezes.  Abdominal: Soft. Bowel sounds are normal. There is no tenderness.  Musculoskeletal: Normal range of motion. He exhibits no edema.  Neurological: He is alert and oriented to person, place, and time.  Skin: Skin is warm. No pallor.  Psychiatric: He has a normal mood and affect.    ED Course  Procedures (including critical care time) DIAGNOSTIC STUDIES: Oxygen Saturation is 100% on room air, normal by my interpretation.    COORDINATION OF CARE: 8:05AM- Ordered penicillin (4x/day) and Vicodin (every 4 hours as needed).     DISP:  Rx [id number] mg qid x 7 days; hydrocodone-acetaminophen every 4 hours if needed for pain.    1. Toothache     I personally performed the services described in this documentation, which was scribed in my presence. The recorded information has been reviewed and considered.  Osvaldo Human, MD      Carleene Cooper III, MD 06/14/12 [phone]

## 2012-06-14 NOTE — ED Notes (Signed)
Patient c/o right lower dental pain since Sunday.  States he called his dentist, but can't be seen for another week.

## 2012-09-20 DIAGNOSIS — I619 Nontraumatic intracerebral hemorrhage, unspecified: Secondary | ICD-10-CM

## 2012-09-20 HISTORY — DX: Nontraumatic intracerebral hemorrhage, unspecified: I61.9

## 2013-01-30 ENCOUNTER — Encounter (HOSPITAL_COMMUNITY): Payer: Self-pay | Admitting: *Deleted

## 2013-01-30 ENCOUNTER — Inpatient Hospital Stay (HOSPITAL_COMMUNITY): Payer: Self-pay

## 2013-01-30 ENCOUNTER — Emergency Department (HOSPITAL_COMMUNITY): Payer: Self-pay

## 2013-01-30 ENCOUNTER — Inpatient Hospital Stay (HOSPITAL_COMMUNITY)
Admission: EM | Admit: 2013-01-30 | Discharge: 2013-02-06 | DRG: 064 | Disposition: A | Discharge status: Rehabilitation Facility | Payer: MEDICAID | Attending: Neurology | Admitting: Neurology

## 2013-01-30 DIAGNOSIS — J9600 Acute respiratory failure, unspecified whether with hypoxia or hypercapnia: Secondary | ICD-10-CM | POA: Diagnosis present

## 2013-01-30 DIAGNOSIS — G932 Benign intracranial hypertension: Secondary | ICD-10-CM | POA: Diagnosis present

## 2013-01-30 DIAGNOSIS — I619 Nontraumatic intracerebral hemorrhage, unspecified: Secondary | ICD-10-CM | POA: Diagnosis present

## 2013-01-30 DIAGNOSIS — I10 Essential (primary) hypertension: Secondary | ICD-10-CM | POA: Diagnosis present

## 2013-01-30 DIAGNOSIS — G936 Cerebral edema: Secondary | ICD-10-CM

## 2013-01-30 DIAGNOSIS — I161 Hypertensive emergency: Secondary | ICD-10-CM

## 2013-01-30 DIAGNOSIS — Z79899 Other long term (current) drug therapy: Secondary | ICD-10-CM

## 2013-01-30 DIAGNOSIS — R1310 Dysphagia, unspecified: Secondary | ICD-10-CM | POA: Diagnosis present

## 2013-01-30 DIAGNOSIS — I615 Nontraumatic intracerebral hemorrhage, intraventricular: Secondary | ICD-10-CM | POA: Diagnosis present

## 2013-01-30 DIAGNOSIS — F17200 Nicotine dependence, unspecified, uncomplicated: Secondary | ICD-10-CM | POA: Diagnosis present

## 2013-01-30 DIAGNOSIS — I629 Nontraumatic intracranial hemorrhage, unspecified: Principal | ICD-10-CM | POA: Diagnosis present

## 2013-01-30 DIAGNOSIS — F1010 Alcohol abuse, uncomplicated: Secondary | ICD-10-CM | POA: Diagnosis present

## 2013-01-30 DIAGNOSIS — Z930 Tracheostomy status: Secondary | ICD-10-CM

## 2013-01-30 DIAGNOSIS — R5381 Other malaise: Secondary | ICD-10-CM | POA: Diagnosis present

## 2013-01-30 HISTORY — DX: Cerebral infarction, unspecified: I63.9

## 2013-01-30 LAB — URINE MICROSCOPIC-ADD ON

## 2013-01-30 LAB — URINALYSIS, ROUTINE W REFLEX MICROSCOPIC
Bilirubin Urine: NEGATIVE
Glucose, UA: NEGATIVE mg/dL
Ketones, ur: NEGATIVE mg/dL
Leukocytes, UA: NEGATIVE
Nitrite: NEGATIVE
Protein, ur: >300 mg/dL — AB
Specific Gravity, Urine: 1.016 (ref 1.005–1.030)
Urobilinogen, UA: 0.2 mg/dL (ref 0.0–1.0)
pH: 7 (ref 5.0–8.0)

## 2013-01-30 LAB — COMPREHENSIVE METABOLIC PANEL
ALT: 26 U/L (ref 0–53)
AST: 53 U/L — ABNORMAL HIGH (ref 0–37)
Albumin: 3.5 g/dL (ref 3.5–5.2)
Alkaline Phosphatase: 62 U/L (ref 39–117)
BUN: 13 mg/dL (ref 6–23)
CO2: 26 mEq/L (ref 19–32)
Calcium: 9.7 mg/dL (ref 8.4–10.5)
Chloride: 100 mEq/L (ref 96–112)
Creatinine, Ser: 1.1 mg/dL (ref 0.50–1.35)
GFR calc Af Amer: >90 mL/min (ref >90)
GFR calc non Af Amer: 79 mL/min — ABNORMAL LOW (ref >90)
Glucose, Bld: 111 mg/dL — ABNORMAL HIGH (ref 70–99)
Potassium: 4.1 mEq/L (ref 3.5–5.1)
Sodium: 139 mEq/L (ref 135–145)
Total Bilirubin: 0.4 mg/dL (ref 0.3–1.2)
Total Protein: 8.7 g/dL — ABNORMAL HIGH (ref 6.0–8.3)

## 2013-01-30 LAB — DIFFERENTIAL
Basophils Absolute: 0 10*3/uL (ref 0.0–0.1)
Basophils Relative: 1 % (ref 0–1)
Eosinophils Absolute: 0.1 10*3/uL (ref 0.0–0.7)
Eosinophils Relative: 1 % (ref 0–5)
Lymphocytes Relative: 21 % (ref 12–46)
Lymphs Abs: 1.8 10*3/uL (ref 0.7–4.0)
Monocytes Absolute: 0.6 10*3/uL (ref 0.1–1.0)
Monocytes Relative: 7 % (ref 3–12)
Neutro Abs: 6.1 10*3/uL (ref 1.7–7.7)
Neutrophils Relative %: 70 % (ref 43–77)

## 2013-01-30 LAB — RAPID URINE DRUG SCREEN, HOSP PERFORMED
Amphetamines: NOT DETECTED
Barbiturates: NOT DETECTED
Benzodiazepines: NOT DETECTED
Cocaine: NOT DETECTED
Opiates: NOT DETECTED
Tetrahydrocannabinol: NOT DETECTED

## 2013-01-30 LAB — POCT I-STAT, CHEM 8
BUN: 13 mg/dL (ref 6–23)
Calcium, Ion: 1.15 mmol/L (ref 1.12–1.23)
Chloride: 103 mEq/L (ref 96–112)
Creatinine, Ser: 1.1 mg/dL (ref 0.50–1.35)
Glucose, Bld: 109 mg/dL — ABNORMAL HIGH (ref 70–99)
HCT: 45 % (ref 39.0–52.0)
Hemoglobin: 15.3 g/dL (ref 13.0–17.0)
Potassium: 4.1 mEq/L (ref 3.5–5.1)
Sodium: 140 mEq/L (ref 135–145)
TCO2: 27 mmol/L (ref 0–100)

## 2013-01-30 LAB — CBC
HCT: 40.1 % (ref 39.0–52.0)
Hemoglobin: 14.5 g/dL (ref 13.0–17.0)
MCH: 35.7 pg — ABNORMAL HIGH (ref 26.0–34.0)
MCHC: 36.2 g/dL — ABNORMAL HIGH (ref 30.0–36.0)
MCV: 98.8 fL (ref 78.0–100.0)
Platelets: 199 10*3/uL (ref 150–400)
RBC: 4.06 MIL/uL — ABNORMAL LOW (ref 4.22–5.81)
RDW: 12.8 % (ref 11.5–15.5)
WBC: 8.6 10*3/uL (ref 4.0–10.5)

## 2013-01-30 LAB — PROTIME-INR
INR: 1.04 (ref 0.00–1.49)
Prothrombin Time: 13.5 seconds (ref 11.6–15.2)

## 2013-01-30 LAB — GLUCOSE, CAPILLARY: Glucose-Capillary: 136 mg/dL — ABNORMAL HIGH (ref 70–99)

## 2013-01-30 LAB — POTASSIUM: Potassium: 4 mEq/L (ref 3.5–5.1)

## 2013-01-30 LAB — TRIGLYCERIDES: Triglycerides: 86 mg/dL (ref <150)

## 2013-01-30 LAB — APTT: aPTT: 29 seconds (ref 24–37)

## 2013-01-30 LAB — SODIUM: Sodium: 140 mEq/L (ref 135–145)

## 2013-01-30 LAB — POCT I-STAT TROPONIN I: Troponin i, poc: 0.01 ng/mL (ref 0.00–0.08)

## 2013-01-30 LAB — MRSA PCR SCREENING: MRSA by PCR: NEGATIVE

## 2013-01-30 LAB — TROPONIN I: Troponin I: <0.3 ng/mL (ref <0.30)

## 2013-01-30 MED ORDER — NICARDIPINE HCL IN NACL 20-0.86 MG/200ML-% IV SOLN
5.0000 mg/h | INTRAVENOUS | Status: DC
  Administered 2013-01-30: 3 mg/h via INTRAVENOUS
  Administered 2013-01-30 (×3): 5 mg/h via INTRAVENOUS
  Filled 2013-01-30: qty 200

## 2013-01-30 MED ORDER — MIDAZOLAM HCL 2 MG/2ML IJ SOLN
4.0000 mg | Freq: Once | INTRAMUSCULAR | Status: AC
  Administered 2013-01-30: 4 mg via INTRAVENOUS
  Filled 2013-01-30: qty 4

## 2013-01-30 MED ORDER — ROCURONIUM BROMIDE 50 MG/5ML IV SOLN
INTRAVENOUS | Status: AC
  Administered 2013-01-30: 10 mg
  Filled 2013-01-30: qty 2

## 2013-01-30 MED ORDER — ONDANSETRON HCL 4 MG/2ML IJ SOLN: 4.0000 mg | Freq: Four times a day (QID) | INTRAMUSCULAR | Status: DC | PRN

## 2013-01-30 MED ORDER — PROPOFOL 10 MG/ML IV EMUL
5.0000 ug/kg/min | Freq: Once | INTRAVENOUS | Status: DC
  Administered 2013-01-30 (×3): 10 ug/kg/min via INTRAVENOUS
  Administered 2013-01-30: 20 ug/kg/min via INTRAVENOUS

## 2013-01-30 MED ORDER — SUCCINYLCHOLINE CHLORIDE 20 MG/ML IJ SOLN
INTRAMUSCULAR | Status: AC
  Filled 2013-01-30: qty 1

## 2013-01-30 MED ORDER — CHLORHEXIDINE GLUCONATE 0.12 % MT SOLN
15.0000 mL | Freq: Two times a day (BID) | OROMUCOSAL | Status: DC
  Administered 2013-01-30 – 2013-02-05 (×11): 15 mL via OROMUCOSAL
  Filled 2013-01-30 (×16): qty 15

## 2013-01-30 MED ORDER — BIOTENE DRY MOUTH MT LIQD
15.0000 mL | Freq: Four times a day (QID) | OROMUCOSAL | Status: DC
  Administered 2013-01-30 – 2013-02-05 (×21): 15 mL via OROMUCOSAL

## 2013-01-30 MED ORDER — MIDAZOLAM HCL 2 MG/2ML IJ SOLN
2.0000 mg | Freq: Once | INTRAMUSCULAR | Status: AC
  Administered 2013-01-30: 2 mg via INTRAVENOUS

## 2013-01-30 MED ORDER — PANTOPRAZOLE SODIUM 40 MG IV SOLR
40.0000 mg | Freq: Every day | INTRAVENOUS | Status: DC
  Administered 2013-01-30 – 2013-02-02 (×4): 40 mg via INTRAVENOUS
  Filled 2013-01-30 (×6): qty 40

## 2013-01-30 MED ORDER — PROPOFOL 10 MG/ML IV EMUL
5.0000 ug/kg/min | INTRAVENOUS | Status: DC
  Administered 2013-01-30: 60 ug/kg/min via INTRAVENOUS
  Administered 2013-01-30 – 2013-02-01 (×6): 45 ug/kg/min via INTRAVENOUS
  Filled 2013-01-30 (×10): qty 100

## 2013-01-30 MED ORDER — LABETALOL HCL 5 MG/ML IV SOLN
10.0000 mg | INTRAVENOUS | Status: DC | PRN
  Administered 2013-02-01 (×6): 20 mg via INTRAVENOUS
  Administered 2013-02-01 – 2013-02-02 (×3): 40 mg via INTRAVENOUS
  Filled 2013-01-30 (×2): qty 8
  Filled 2013-01-30 (×4): qty 4
  Filled 2013-01-30 (×2): qty 8

## 2013-01-30 MED ORDER — NICARDIPINE HCL IN NACL 20-0.86 MG/200ML-% IV SOLN
5.0000 mg/h | Freq: Once | INTRAVENOUS | Status: DC
  Filled 2013-01-30 (×4): qty 200

## 2013-01-30 MED ORDER — FENTANYL CITRATE 0.05 MG/ML IJ SOLN
25.0000 ug | INTRAMUSCULAR | Status: DC | PRN
  Administered 2013-01-30 – 2013-02-01 (×10): 100 ug via INTRAVENOUS
  Administered 2013-02-01: 50 ug via INTRAVENOUS
  Filled 2013-01-30 (×11): qty 2

## 2013-01-30 MED ORDER — ETOMIDATE 2 MG/ML IV SOLN
INTRAVENOUS | Status: AC
  Administered 2013-01-30: 20 mg
  Filled 2013-01-30: qty 20

## 2013-01-30 MED ORDER — LIDOCAINE HCL (CARDIAC) 20 MG/ML IV SOLN
INTRAVENOUS | Status: AC
  Filled 2013-01-30: qty 5

## 2013-01-30 MED ORDER — ACETAMINOPHEN 325 MG PO TABS: 650.0000 mg | ORAL_TABLET | ORAL | Status: DC | PRN

## 2013-01-30 MED ORDER — ACETAMINOPHEN 650 MG RE SUPP: 650.0000 mg | RECTAL | Status: DC | PRN

## 2013-01-30 MED ORDER — MIDAZOLAM HCL 2 MG/2ML IJ SOLN
INTRAMUSCULAR | Status: AC
  Filled 2013-01-30: qty 2

## 2013-01-30 MED ORDER — SENNOSIDES-DOCUSATE SODIUM 8.6-50 MG PO TABS
1.0000 | ORAL_TABLET | Freq: Two times a day (BID) | ORAL | Status: DC
  Administered 2013-01-31 – 2013-02-06 (×7): 1 via ORAL
  Filled 2013-01-30 (×13): qty 1

## 2013-01-30 MED ORDER — SODIUM CHLORIDE 3 % IV SOLN
INTRAVENOUS | Status: DC
  Administered 2013-01-30 – 2013-01-31 (×5): via INTRAVENOUS
  Filled 2013-01-30 (×11): qty 500

## 2013-01-30 MED ORDER — PROPOFOL 10 MG/ML IV EMUL
INTRAVENOUS | Status: AC
  Administered 2013-01-30: 30 ug/kg/min via INTRAVENOUS
  Filled 2013-01-30: qty 100

## 2013-01-30 NOTE — ED Notes (Signed)
Pt transported back toroom B 18 and during transport went nonverbal, snoring respirations. EDP brought to bedside, neurology at bedside. Agreement for pt to be intubated.

## 2013-01-30 NOTE — Progress Notes (Signed)
ET tube advanced 2 cm to 24 cm at the teeth per order Hyacinth Meeker, MD.

## 2013-01-30 NOTE — Progress Notes (Signed)
SLP Cancellation Note  Patient Details Name: Rick Williams MRN: [id number] DOB: Jan 15, 1966   Cancelled treatment:       Reason Eval/Treat Not Completed: Medical issues which prohibited therapy (On vent. Will f/u 5/14. )  [name] MA, CCC-SLP [phone]  Tessa Seaberry Meryl 01/30/2013, 12:27 PM

## 2013-01-30 NOTE — Procedures (Signed)
PROCEDURE NOTE: [place] CVL PLACEMENT  INDICATION:    Monitoring of central venous pressures and/or administration of medications optimally administered in central vein  CONSENT:   Risks of procedure as well as the alternatives were explained to the patient or surrogate. Consent for procedure obtained. A time out was performed to review patient identification, procedure to be performed, correct patient position, medications/allergies/relevent history, required imaging and test results.  PROCEDURE  Maximum sterile technique was used including antiseptics, cap, gloves, gown, hand hygiene, mask and sheet.  Skin prep: Chlorhexidine; local anesthetic administered  A antimicrobial bonded/coated triple lumen catheter was placed in the [place] vein using the Seldinger technique.     EVALUATION:  Blood flow good  Complications: No apparent complications  Patient tolerated the procedure well.  Chest X-ray: well positioned without ptx   Billy Fischer, MD PCCM service Mobile [phone]

## 2013-01-30 NOTE — Consult Note (Signed)
Referring Physician: ED    Chief Complaint: code stroke: headache, left hemiplegia, and decreased responsiveness.  HPI:                                                                                                                                         Rick Williams is an 47 y.o. male with a past medical history significant for hypertension, poorly adherent to treatment, ETOH use, stroke without apparent residual deficits, brought to [facility] ED by medics as a code stroke after being found on the floor with decreased responsiveness and left hemiplegia. Rick Williams said that he hasn't been taking his BP medications for the last 3 months and at 2230 developed severe headache for which he took Benadryl to no avail. Then, his girlfriend reports finding him on the floor, less communicative, and not able to move the left side. According to EMS his BP at the scene was 190/110 and he was completely hemiplegic in the left side. On route to the hospital he was responding only to sternal rub. NIHSS 10. Emergent CT brain revealed a large right ICH centered around the BG, with intraventricular extension and 8 mm right to let midline shift. After returning from CT he started to demonstrate declining mental status and the decision was made to proceed with early intubation. Still with SBP above 190 and nicardipine drip started.    Date last known well: 01/30/13 Time last known well: 2230 tPA Given: no, ICH precludes thrombolysis.  Past Medical History  Diagnosis Date  . Hypertension   . Pneumonia   . Hemothorax on left     No past surgical history on file.  No family history on file. Social History:  reports that he has been smoking Cigarettes.  He has been smoking about 1.00 pack per day. He does not have any smokeless tobacco history on file. He reports that  drinks alcohol. He reports that he does not use illicit drugs.  Allergies:  Allergies  Allergen Reactions  . Benadryl (Diphenhydramine Hcl)  Itching    Medications:                                                                                                                           I have reviewed the patient's current medications.  ROS: unable to obtain due to declining mental status.  History obtained from the patient and chart review.    Physical exam: pleasant male in no apparent distress. BP 190/100,  P 82,  R 16,  afebrile. Head: normocephalic. Neck: supple, no bruits, no JVD. Cardiac: no murmurs. Lungs: clear. Abdomen: soft, no tender, no mass. Extremities: no edema.   Neurologic Examination:                                                                                                      Mental Status: Mildly lethargic. thought content appropriate.  Speech fluent without evidence of aphasia.  Able to follow 3 step commands without difficulty. Cranial Nerves: II: Discs flat bilaterally; Visual fields grossly normal, pupils equal, round, reactive to light and accommodation III,IV, VI: ptosis not present, extra-ocular motions intact bilaterally V: facial light touch sensation normal bilaterally VII: left facial weakness. VIII: hearing normal bilaterally IX,X: gag reflex present XI: bilateral shoulder shrug XII: midline tongue extension Motor: Left hemiplegia. Of note, the right leg also seems to be weak. Sensory: Pinprick and light touch intact throughout, bilaterally Deep Tendon Reflexes: 2+ and symmetric throughout Plantars: Right: downgoing   Left: upgoing Cerebellar: No tested. Gait:  No tested. CV: pulses palpable throughout      Results for orders placed during the hospital encounter of 01/30/13 (from the past 48 hour(s))  POCT I-STAT TROPONIN I     Status: None   Collection Time    01/30/13  5:56 AM      Result Value Range   Troponin i, poc 0.01  0.00 -  0.08 ng/mL   Comment 3            Comment: Due to the release kinetics of cTnI,     a negative result within the first hours     of the onset of symptoms does not rule out     myocardial infarction with certainty.     If myocardial infarction is still suspected,     repeat the test at appropriate intervals.  POCT I-STAT, CHEM 8     Status: Abnormal   Collection Time    01/30/13  5:58 AM      Result Value Range   Sodium 140  135 - 145 mEq/L   Potassium 4.1  3.5 - 5.1 mEq/L   Chloride 103  96 - 112 mEq/L   BUN 13  6 - 23 mg/dL   Creatinine, Ser 1.61  0.50 - 1.35 mg/dL   Glucose, Bld 096 (*) 70 - 99 mg/dL   Calcium, Ion 0.45  4.09 - 1.23 mmol/L   TCO2 27  0 - 100 mmol/L   Hemoglobin 15.3  13.0 - 17.0 g/dL   HCT 81.1  91.4 - 78.2 %   Ct Head (brain) Wo Contrast  01/30/2013  *RADIOLOGY REPORT*  Clinical Data: Code stroke, left-sided weakness.  CT HEAD WITHOUT CONTRAST  Technique:  Contiguous axial images were obtained from the base of the skull through the vertex without contrast.  Comparison: None.  Findings: Intraparenchymal hemorrhage centered within the right basal ganglia measures up to 5.3 cm  in diameter.  There is mass effect on the frontal horn of the right lateral ventricle and the third ventricle as well as intraventricular extension of blood into the right lateral ventricle and third ventricle. 8 mm of right-to- left midline shift. Right cerebral sulcal effacement.  No overt hydrocephalus at this time. The visualized paranasal sinuses and mastoid air cells are predominately clear.  IMPRESSION: Intraparenchymal hemorrhage centered within the right basal ganglia, with intraventricular extension and 8 mm right to left midline shift.  Critical Value/emergent results were called by telephone at the time of interpretation on 01/30/2013 at 06:00 a.m. to Dr. Eber Hong, who verbally acknowledged these results.   Original Report Authenticated By: Jearld Lesch, M.D.        Assessment:  47 y.o. male with HTN, poorly adherent to treatment, and large right BG hypertensive ICH with IV extension and midline shift but no frank hydrocephalus. Required early intubation due to declining mental status. Needs admission to NICU for symptomatic ICH management. Start nicardipine drip, goal SBP <160. Will start hypertonic saline. Follow up CT brain in 24 hours unless further neurological deterioration. Stroke team to resume care and make further recommendations accordingly.    Stroke Risk Factors - HTN    Wyatt Portela, MD [facility] [phone]  01/30/2013, 6:09 AM

## 2013-01-30 NOTE — Code Documentation (Signed)
Patient started to have headache on 5/12 around 2230, he took benadryl to alleviate the headache with no relief. Patient stated that he started to feeling weak on left side around 0200. Code stroke called at 0527, patient arrived via EMS 0543, LKW 0200, EDP exam at 0545, stroke team arrived at 0535, neurologist arrived at 47, patient arrived in CT at 56, phlebotomist arrived at 50, CT read by Dr. Cyril Mourning at [phone]. Initial NIH 10, upon arrival to room patient's mental status started to decline and was intubated by EDP. Code stroke cancelled based upon CT findings at [facility]

## 2013-01-30 NOTE — ED Notes (Signed)
Pt in CT, pt alert and oriented, able to tell name, date of birth, and age. Pt able to follow commands, pt able to follow with eyes. Pt unable to hold left arm in air, pt unable to lift either leg and hold in air. Pt retracting from blood draw stick with right arm.

## 2013-01-30 NOTE — Progress Notes (Signed)
Utilization review completed.  P.J. Anapaola Kinsel,RN,BSN Case Manager [phone]  

## 2013-01-30 NOTE — Progress Notes (Signed)
Sedation Note: Pt w/ RASS Goal 0, on propofol gtt at 45 mcg/kg/min, soft wrist restraints on.  Generally RASS = -2, but with any stimulation (oral care, repositioning, etc. pt is immediately agitated to RASS +2, asynchronous with vent, sitting up, reaching for ETT).  Sudden agitation relieved by propofol bolus or fentanyl IVP.

## 2013-01-30 NOTE — ED Notes (Addendum)
Pt continuing to pull at tubes including ETT and foley.  Paged Dr. Pearlean Brownie and given verbal order for wrist restraints.  Pt currently on 55 mcg Propofol drip and was given 2 mg Versed with little reduction in "fighting" vent and pulling tubes.

## 2013-01-30 NOTE — Consult Note (Signed)
PULMONARY/CCM NOTE  Requesting MD/Service: Stroke/Sethi Date of admission: 5/13 Date of consult: 5/13 Reason for consultation: Vent mgmt, CVL for hypertonic saline  Pt Profile:  71 M with PMH only of hypertension admitted via ED with hemorrhagic hypertensive CVA and progressive obtundation requiring intubation. Hypertonic saline protocol ordered by Stroke team. PCCM asked to assit with vent/CCM mgmt and place CVL for 3% saline.  Studies/Events: 5/13 CT head: Intraparenchymal hemorrhage centered within the right basal ganglia, with intraventricular extension and 8 mm right to left midline shift 5/13 CT head:   Lines, Tubes, etc: ETT 5/13 >>  R [place] CVL 5/13 >>   Microbiology:   Antibiotics:     Consults:  Stroke (primary)    Best Practice: DVT: SCDs SUP: PPI Nutrition:  Glycemic control: N/I Sedation/analgesia: Propofol    HPI:  As above. Notably, pt called EMS himself but rapidly deteriorated en route to [facility]. Wife notes that he has been noncompliant with anti-hypertensive meds. Was in [facility] day prior to admission  Past Medical History  Diagnosis Date  . Hypertension   . Pneumonia   . Hemothorax on left   . Stroke 2007    no deficits    MEDICATIONS: reviewed  History   Social History  . Marital Status: Married    Spouse Name: N/A    Number of Children: N/A  . Years of Education: N/A   Occupational History  . Not on file.   Social History Main Topics  . Smoking status: Current Every Day Smoker -- 1.00 packs/day    Types: Cigarettes  . Smokeless tobacco: Not on file  . Alcohol Use: Yes  . Drug Use: No     Comment: last use 2 months ago  . Sexually Active: Not on file   Other Topics Concern  . Not on file   Social History Narrative  . No narrative on file    History reviewed. No pertinent family history.  ROS - Per HPI, otherwise unavailable  Filed Vitals:   01/30/13 1200 01/30/13 1240 01/30/13 1300 01/30/13 1400  BP: 115/68 119/68 127/84  127/79  Pulse: 83 84 83 85  Temp: 100.4 F (38 C) 100.6 F (38.1 C) 100.6 F (38.1 C) 101.1 F (38.4 C)  Resp: 16 16 17 17   Height:      Weight:      SpO2: 100% 100% 100% 100%    EXAM:  Gen: WDWN, intermittently agitated HEENT: pupils pinpoint, EOMI, /AT Neck: No JVD, no LAN Lungs: Clear to auscultation Cardiovascular: RRR s M Abdomen: Soft, NT, NABS Rxt: warm, no edema Neuro: MAEs and withdraws from pain   DATA:   BMET    Component Value Date/Time   NA 140 01/30/2013 0558   K 4.0 01/30/2013 1353   CL 103 01/30/2013 0558   CO2 26 01/30/2013 0550   GLUCOSE 109* 01/30/2013 0558   BUN 13 01/30/2013 0558   CREATININE 1.10 01/30/2013 0558   CALCIUM 9.7 01/30/2013 0550   GFRNONAA 79* 01/30/2013 0550   GFRAA >90 01/30/2013 0550    CBC    Component Value Date/Time   WBC 8.6 01/30/2013 0550   RBC 4.06* 01/30/2013 0550   HGB 15.3 01/30/2013 0558   HCT 45.0 01/30/2013 0558   PLT 199 01/30/2013 0550   MCV 98.8 01/30/2013 0550   MCH 35.7* 01/30/2013 0550   MCHC 36.2* 01/30/2013 0550   RDW 12.8 01/30/2013 0550   LYMPHSABS 1.8 01/30/2013 0550   MONOABS 0.6 01/30/2013 0550   EOSABS  0.1 01/30/2013 0550   BASOSABS 0.0 01/30/2013 0550    CXR: NACPD   IMPRESSION:   Hemorrhagic stroke Intraventricular hemorrhage Intracranial hypertension Acute respiratory failure due to AMS Hypertension  Goal SBP 120-160 mmHg  PLAN:  Vent settings established Vent bundle ordered BP mgmt per Stroke team Hypertonic saline protocol   30 mins CCM time    Billy Fischer, MD ; [facility] service Mobile [phone].  After 5:30 PM or weekends, call [phone]

## 2013-01-30 NOTE — ED Provider Notes (Signed)
History     CSN: [id number]  Arrival date & time 01/30/13  0543   First MD Initiated Contact with Patient 01/30/13 0546      No chief complaint on file.   (Consider location/radiation/quality/duration/timing/severity/associated sxs/prior treatment) HPI Comments: Level V caveat apply secondary to altered mental status  The patient is a 50 her old male with a history of pneumonia status post tracheostomy in the past who also has a history of hypertension. He presents with a complaint of altered mental status as well as left-sided weakness. Family members found the patient, last seen normal at 2:00 AM, called 911 because he was unable to use his left arm and left facial droop. He was also extremely tired and difficult to arouse. The patient is unable to give any other information secondary to his somnolent too obtunded state  The history is provided by the patient and the EMS personnel.    Past Medical History  Diagnosis Date  . Hypertension   . Pneumonia   . Hemothorax on left     No past surgical history on file.  No family history on file.  History  Substance Use Topics  . Smoking status: Current Every Day Smoker -- 1.00 packs/day    Types: Cigarettes  . Smokeless tobacco: Not on file  . Alcohol Use: Yes      Review of Systems  Unable to perform ROS: Mental status change    Allergies  Benadryl  Home Medications   Current Outpatient Rx  Name  Route  Sig  Dispense  Refill  . HYDROcodone-acetaminophen (NORCO/VICODIN) 5-325 MG per tablet   Oral   Take 1 tablet by mouth every 4 (four) hours as needed for pain.   20 tablet   0   . metoprolol (LOPRESSOR) 100 MG tablet   Oral   Take 0.5 tablets (50 mg total) by mouth 2 (two) times daily.   60 tablet   3     BP 211/134  Pulse 96  Resp 17  SpO2 100%  Physical Exam  Nursing note and vitals reviewed. Constitutional: He appears well-developed and well-nourished. He appears distressed.  HENT:  Head:  Normocephalic and atraumatic.  Mouth/Throat: Oropharynx is clear and moist. No oropharyngeal exudate.  Eyes: Conjunctivae and EOM are normal. Pupils are equal, round, and reactive to light. Right eye exhibits no discharge. Left eye exhibits no discharge. No scleral icterus.  Neck: Normal range of motion. Neck supple. No JVD present. No thyromegaly present.  Cardiovascular: Normal rate, regular rhythm, normal heart sounds and intact distal pulses.  Exam reveals no gallop and no friction rub.   No murmur heard. Pulmonary/Chest: Effort normal and breath sounds normal. No respiratory distress. He has no wheezes. He has no rales.  Abdominal: Soft. Bowel sounds are normal. He exhibits no distension and no mass. There is no tenderness.  Musculoskeletal: Normal range of motion. He exhibits no edema and no tenderness.  Lymphadenopathy:    He has no cervical adenopathy.  Neurological:  Somnolent, difficult to arouse, pupils are equal round small. Left upper extremity unable to lift off the bed, right upper extremity normal, bilateral lower extremities able to move at the hips knees and ankles.  Skin: Skin is warm and dry. No rash noted. No erythema.  Psychiatric: He has a normal mood and affect. His behavior is normal.    ED Course  Procedures (including critical care time)  Labs Reviewed  CBC - Abnormal; Notable for the following:    RBC  4.06 (*)    MCH 35.7 (*)    MCHC 36.2 (*)    All other components within normal limits  COMPREHENSIVE METABOLIC PANEL - Abnormal; Notable for the following:    Glucose, Bld 111 (*)    Total Protein 8.7 (*)    AST 53 (*)    GFR calc non Af Amer 79 (*)    All other components within normal limits  POCT I-STAT, CHEM 8 - Abnormal; Notable for the following:    Glucose, Bld 109 (*)    All other components within normal limits  PROTIME-INR  APTT  DIFFERENTIAL  TROPONIN I  POCT I-STAT TROPONIN I   Ct Head (brain) Wo Contrast  01/30/2013  *RADIOLOGY REPORT*   Clinical Data: Code stroke, left-sided weakness.  CT HEAD WITHOUT CONTRAST  Technique:  Contiguous axial images were obtained from the base of the skull through the vertex without contrast.  Comparison: None.  Findings: Intraparenchymal hemorrhage centered within the right basal ganglia measures up to 5.3 cm in diameter.  There is mass effect on the frontal horn of the right lateral ventricle and the third ventricle as well as intraventricular extension of blood into the right lateral ventricle and third ventricle. 8 mm of right-to- left midline shift. Right cerebral sulcal effacement.  No overt hydrocephalus at this time. The visualized paranasal sinuses and mastoid air cells are predominately clear.  IMPRESSION: Intraparenchymal hemorrhage centered within the right basal ganglia, with intraventricular extension and 8 mm right to left midline shift.  Critical Value/emergent results were called by telephone at the time of interpretation on 01/30/2013 at 06:00 a.m. to Dr. Eber Hong, who verbally acknowledged these results.   Original Report Authenticated By: Jearld Lesch, M.D.      1. ICH (intracerebral hemorrhage)   2. Hypertensive emergency       MDM  The patient has presented with strokelike symptoms, he was sent immediately to the CT scan and found to have a large intracranial hemorrhage with shift and compression. Neurology at the bedside to arrange for intensive care unit placement, consultation with neurosurgery at Dr. Cheral Marker discretion after our conversation, laboratory workup.  The patient had a progressive obtunded state best to protect his airway and prevent aspiration he was intubated. His vital signs show severe hypertensive emergency given his intracranial hemorrhage, intravenous Cardene drip was started as well as propofol drip for sedation after intubation.  INTUBATION Performed by: Vida Roller  Required items: required blood products, implants, devices, and special  equipment available Patient identity confirmed: provided demographic data and hospital-assigned identification number Time out: Immediately prior to procedure a "time out" was called to verify the correct patient, procedure, equipment, support staff and site/side marked as required.  Indications: Airway protection   Intubation method: Direct Laryngoscopy   Preoxygenation: BVM  Sedatives: 20 mg Etomidate Paralytic: 100 mg Rocuronium  Tube Size: 8.0 cuffed  Post-procedure assessment: chest rise and ETCO2 monitor Breath sounds: equal and absent over the epigastrium Tube secured with: ETT holder Chest x-ray interpreted by radiologist and me.  Chest x-ray findings: endotracheal tube in appropriate position - though high - I have asked RT to advance 2 cm.  Patient tolerated the procedure well with no immediate complications.  ED ECG REPORT  I personally interpreted this EKG   Date: 01/30/2013   Rate: 55  Rhythm: sinus bradycardia  QRS Axis: normal  Intervals: normal  ST/T Wave abnormalities: normal  Conduction Disutrbances:none  Narrative Interpretation: Left ventricular hypertrophy present  Old  EKG Reviewed: Compared with 05/15/2012, rate slower    CRITICAL CARE Performed by: Eber Hong D Total critical care time: 35 Critical care time was exclusive of separately billable procedures and treating other patients. Critical care was necessary to treat or prevent imminent or life-threatening deterioration. Critical care was time spent personally by me on the following activities: development of treatment plan with patient and/or surrogate as well as nursing, discussions with consultants, evaluation of patient's response to treatment, examination of patient, obtaining history from patient or surrogate, ordering and performing treatments and interventions, ordering and review of laboratory studies, ordering and review of radiographic studies, pulse oximetry and re-evaluation of  patient's condition.         Vida Roller, MD 01/30/13 [phone]

## 2013-01-30 NOTE — Progress Notes (Addendum)
Stroke Team Progress Note  HISTORY Rick Williams is an 47 y.o. male with a past medical history significant for hypertension, poorly adherent to treatment, ETOH use, stroke without apparent residual deficits, brought to [facility] ED by medics as a code stroke after being found on the floor with decreased responsiveness and left hemiplegia.  Rick Williams said that he hasn't been taking his BP medications for the last 3 months and at 2230 developed severe headache for which he took Benadryl to no avail. Then, his girlfriend reports finding him on the floor, less communicative, and not able to move the left side.  According to EMS his BP at the scene was 190/110 and he was completely hemiplegic in the left side. On route to the hospital he was responding only to sternal rub.  NIHSS 10. Emergent CT brain revealed a large right ICH centered around the BG, with intraventricular extension and 8 mm right to let midline shift.  After returning from CT he started to demonstrate declining mental status and the decision was made to proceed with early intubation. Still with SBP above 190 and nicardipine drip started.  Date last known well: 01/30/13  Time last known well: 2230   Patient was not a TPA candidate secondary to ICH. He was admitted to the neuro ICU for further evaluation and treatment.  SUBJECTIVE No family member is at the bedside.  Overall he is in critical condition, sedated and intubated for airway protection.  OBJECTIVE Most recent Vital Signs: Filed Vitals:   01/30/13 0745 01/30/13 0800 01/30/13 0815 01/30/13 0830  BP: 141/99 143/91 120/80 125/77  Pulse: 127 111 100 96  Resp: 27 27 18 18   Height:      Weight:      SpO2: 96% 95% 100% 100%   CBG (last 3)   Recent Labs  01/30/13 0748  GLUCAP 136*    IV Fluid Intake:   . niCARDipine 5 mg/hr (01/30/13 0646)  . sodium chloride (hypertonic)      MEDICATIONS  . lidocaine (cardiac) 100 mg/83ml      . midazolam      . niCARDipine  5  mg/hr Intravenous Once  . pantoprazole (PROTONIX) IV  40 mg Intravenous QHS  . senna-docusate  1 tablet Oral BID  . succinylcholine       PRN:  acetaminophen, acetaminophen, labetalol, ondansetron (ZOFRAN) IV  Diet:  NPO  Activity:  Bedrest DVT Prophylaxis:  SCDs  CLINICALLY SIGNIFICANT STUDIES Basic Metabolic Panel:  Recent Labs Lab 01/30/13 0550 01/30/13 0558  NA 139 140  K 4.1 4.1  CL 100 103  CO2 26  --   GLUCOSE 111* 109*  BUN 13 13  CREATININE 1.10 1.10  CALCIUM 9.7  --    Liver Function Tests:  Recent Labs Lab 01/30/13 0550  AST 53*  ALT 26  ALKPHOS 62  BILITOT 0.4  PROT 8.7*  ALBUMIN 3.5   CBC:  Recent Labs Lab 01/30/13 0550 01/30/13 0558  WBC 8.6  --   NEUTROABS 6.1  --   HGB 14.5 15.3  HCT 40.1 45.0  MCV 98.8  --   PLT 199  --    Coagulation:  Recent Labs Lab 01/30/13 0550  LABPROT 13.5  INR 1.04   Cardiac Enzymes:  Recent Labs Lab 01/30/13 0550  TROPONINI <0.30   Urinalysis:  Recent Labs Lab 01/30/13 0721  COLORURINE YELLOW  LABSPEC 1.016  PHURINE 7.0  GLUCOSEU NEGATIVE  HGBUR MODERATE*  BILIRUBINUR NEGATIVE  KETONESUR NEGATIVE  PROTEINUR >300*  UROBILINOGEN 0.2  NITRITE NEGATIVE  LEUKOCYTESUR NEGATIVE    Urine Drug Screen:     Component Value Date/Time   LABOPIA NONE DETECTED 01/30/2013 0738   COCAINSCRNUR NONE DETECTED 01/30/2013 0738   LABBENZ NONE DETECTED 01/30/2013 0738   AMPHETMU NONE DETECTED 01/30/2013 0738   THCU NONE DETECTED 01/30/2013 0738   LABBARB NONE DETECTED 01/30/2013 0738     CT of the brain  01/30/13 @ 6:06AM Intraparenchymal hemorrhage centered within the right basal  ganglia, with intraventricular extension and 8 mm right to left  midline shift.   CXR - 5/13 ET tube tip is at the thoracic inlet. Recommend advancement  1.5-2.5 cm.  Lungs are clear   EKG : nonspecific T wave changes.    Physical Exam Intubated on vent.  t. Afebrile. Head is nontraumatic. Neck is supple without bruit.  .  Cardiac exam no murmur or gallop. Lungs are clear to auscultation. Distal pulses are well felt.  Neurologic Exam:   Patient is sedated and intubated. Pupils are 3 mm equal reactive. Right gaze preference but can move the eyes to midline on reflex eye movements. Fundi were not visualized. Left lower facial weakness. Tongue is midline. Patient will not open eyes or follow commands. He has spontaneous right sided movements when stimulated and purposeful antigravity movements. Minimum withdrawal in the left lower extremity to painful stimuli. No withdrawal in the left upper extremity to painful stimuli. Plantar is upgoing. Right is downgoing.   ASSESSMENT [name] is a 47 y.o. 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     Hospital day # 0  TREATMENT/PLAN  Start 3% saline at 75cc/hr for cerebral edema  Check Na q6hrs, goal Na 150-155  Consult PCCM for central line placement  Repeat CT scan this afternoon to monitor hemorrhage  Continue Cardene gtt with goal SBP of 160's x 24 hours, then 180's if hemorrhage stable  GI ppx with Protonix IV  Keep normothermia and normoglycemia.  Check HbA1c, lipid panel  Carrolyn Meiers, PGY-3 Internal Medicine [facility] 01/30/2013 9:09 AM This patient is critically ill and at significant risk of neurological worsening, death and care requires constant monitoring of vital signs, hemodynamics,respiratory and cardiac monitoring,review of multiple databases, neurological assessment, discussion with family, other specialists and medical decision making of high complexity. I spent 50 minutes of neurocritical care time  in the care of  this patient. I have personally obtained a history, examined the patient, evaluated imaging results, and formulated the assessment and plan of care. I agree with the  above.  Discussed with patient's wife, daughter and Dr. Janeann Forehand, MD

## 2013-01-30 NOTE — ED Notes (Signed)
I stat chem 8 and I stat troponin results given to Dr. Hyacinth Meeker by B. Bing Plume, EMT

## 2013-01-30 NOTE — ED Notes (Signed)
Per EMS: pt lives at hotel. Pt had a HA at 22:30 last night and took a benadryl. Pt states that at 02:00 he noticed left sided weakness. At 05:00 pt walked to the lobby of the hotel called 911 and then walked back to his room. When EMS arrived pt was having left sided weakness, en route pt became nonverbal with snoring respirations and was strenal rubbed for responses. Pt arrive to E.D. Alert and oriented x4.

## 2013-01-31 ENCOUNTER — Inpatient Hospital Stay (HOSPITAL_COMMUNITY): Payer: Self-pay

## 2013-01-31 ENCOUNTER — Encounter (HOSPITAL_COMMUNITY): Payer: Self-pay | Admitting: Radiology

## 2013-01-31 DIAGNOSIS — J9600 Acute respiratory failure, unspecified whether with hypoxia or hypercapnia: Secondary | ICD-10-CM

## 2013-01-31 LAB — SODIUM
Sodium: 144 mEq/L (ref 135–145)
Sodium: 153 mEq/L — ABNORMAL HIGH (ref 135–145)
Sodium: 156 mEq/L — ABNORMAL HIGH (ref 135–145)
Sodium: 153 mEq/L — ABNORMAL HIGH (ref 135–145)

## 2013-01-31 LAB — CBC
HCT: 37.6 % — ABNORMAL LOW (ref 39.0–52.0)
Hemoglobin: 12.9 g/dL — ABNORMAL LOW (ref 13.0–17.0)
MCH: 34.9 pg — ABNORMAL HIGH (ref 26.0–34.0)
MCHC: 34.3 g/dL (ref 30.0–36.0)
MCV: 101.6 fL — ABNORMAL HIGH (ref 78.0–100.0)
Platelets: 168 10*3/uL (ref 150–400)
RBC: 3.7 MIL/uL — ABNORMAL LOW (ref 4.22–5.81)
RDW: 13.3 % (ref 11.5–15.5)
WBC: 11.2 10*3/uL — ABNORMAL HIGH (ref 4.0–10.5)

## 2013-01-31 LAB — BASIC METABOLIC PANEL
BUN: 12 mg/dL (ref 6–23)
CO2: 24 mEq/L (ref 19–32)
Calcium: 7.8 mg/dL — ABNORMAL LOW (ref 8.4–10.5)
Chloride: 114 mEq/L — ABNORMAL HIGH (ref 96–112)
Creatinine, Ser: 0.98 mg/dL (ref 0.50–1.35)
GFR calc Af Amer: >90 mL/min (ref >90)
GFR calc non Af Amer: >90 mL/min (ref >90)
Glucose, Bld: 94 mg/dL (ref 70–99)
Potassium: 3.5 mEq/L (ref 3.5–5.1)
Sodium: 146 mEq/L — ABNORMAL HIGH (ref 135–145)

## 2013-01-31 MED ORDER — ACETAMINOPHEN 325 MG PO TABS
650.0000 mg | ORAL_TABLET | ORAL | Status: DC | PRN
  Administered 2013-02-01 – 2013-02-05 (×6): 650 mg
  Filled 2013-01-31 (×6): qty 2

## 2013-01-31 MED ORDER — ACETAMINOPHEN 650 MG RE SUPP: 650.0000 mg | RECTAL | Status: DC | PRN

## 2013-01-31 MED ORDER — JEVITY 1.2 CAL PO LIQD
1000.0000 mL | ORAL | Status: DC
  Filled 2013-01-31 (×2): qty 1000

## 2013-01-31 MED ORDER — SODIUM CHLORIDE 3 % IV SOLN
INTRAVENOUS | Status: DC
  Filled 2013-01-31 (×2): qty 500

## 2013-01-31 MED ORDER — POTASSIUM CHLORIDE 20 MEQ/15ML (10%) PO LIQD
40.0000 meq | Freq: Once | ORAL | Status: AC
  Administered 2013-01-31: 40 meq
  Filled 2013-01-31: qty 30

## 2013-01-31 MED ORDER — POTASSIUM CHLORIDE 10 MEQ/50ML IV SOLN
10.0000 meq | INTRAVENOUS | Status: AC
  Administered 2013-01-31 (×2): 10 meq via INTRAVENOUS
  Filled 2013-01-31 (×2): qty 50

## 2013-01-31 MED ORDER — PRO-STAT SUGAR FREE PO LIQD
60.0000 mL | Freq: Three times a day (TID) | ORAL | Status: DC
  Administered 2013-01-31 – 2013-02-01 (×4): 60 mL
  Filled 2013-01-31 (×6): qty 60

## 2013-01-31 MED ORDER — ADULT MULTIVITAMIN LIQUID CH
5.0000 mL | Freq: Every day | ORAL | Status: DC
  Administered 2013-01-31 – 2013-02-01 (×2): 5 mL
  Filled 2013-01-31 (×2): qty 5

## 2013-01-31 MED ORDER — JEVITY 1.2 CAL PO LIQD
1000.0000 mL | ORAL | Status: DC
  Administered 2013-01-31: 1000 mL
  Filled 2013-01-31 (×3): qty 1000

## 2013-01-31 MED ORDER — SODIUM CHLORIDE 3 % IV SOLN
INTRAVENOUS | Status: DC
  Filled 2013-01-31 (×7): qty 500

## 2013-01-31 NOTE — Progress Notes (Signed)
Pt transported to CT and back to 3100 without any complications

## 2013-01-31 NOTE — Progress Notes (Signed)
PULMONARY/CCM NOTE  Requesting MD/Service: Stroke/Sethi Date of admission: 5/13 Date of consult: 5/13 Reason for consultation: Vent mgmt, CVL for hypertonic saline  Pt Profile:  30 M with PMH only of hypertension admitted via ED with hemorrhagic hypertensive CVA and progressive obtundation requiring intubation. Hypertonic saline protocol ordered by Stroke team. PCCM asked to assit with vent/CCM mgmt and place CVL for 3% saline.  Studies/Events: 5/13 CT head: Intraparenchymal hemorrhage centered within the right basal ganglia, with intraventricular extension and 8 mm right to left midline shift 5/13 CT head: Slight increased sized right basal ganglia intraparenchymal hemorrhage. Unchanged 7 mm right-to-left shift 5/14 Ct head: No increase in the parenchymal hemorrhage. Minimal increase in the blood in the posterior aspect of the right lateral ventricle. No change in midline shift   Lines, Tubes, etc: ETT 5/13 >>  R [place] CVL 5/13 >>   Microbiology:   Antibiotics:     Consults:  Stroke (primary)    Best Practice: DVT: SCDs SUP: PPI Nutrition:  Glycemic control: N/I Sedation/analgesia: Propofol    SUBJ:  RASS -1. + F/C on WUA. W/D all extremities  Filed Vitals:   01/31/13 1000 01/31/13 1100 01/31/13 1200 01/31/13 1259  BP: 139/84 135/83 136/81 136/81  Pulse: 60  52 54  Temp: 98.8 F (37.1 C)     TempSrc:      Resp: 16 16 16 16   Height:      Weight:      SpO2: 98%  98% 98%    EXAM:  Gen: WDWN, intermittently agitated HEENT: pupils pinpoint, EOMI, Leonardo/AT Neck: No JVD, no LAN Lungs: Clear to auscultation Cardiovascular: RRR s M Abdomen: Soft, NT, NABS Rxt: warm, no edema Neuro: MAEs and withdraws from pain   DATA:   BMET    Component Value Date/Time   NA 153* 01/31/2013 0941   K 3.5 01/31/2013 0245   CL 114* 01/31/2013 0245   CO2 24 01/31/2013 0245   GLUCOSE 94 01/31/2013 0245   BUN 12 01/31/2013 0245   CREATININE 0.98 01/31/2013 0245   CALCIUM 7.8*  01/31/2013 0245   GFRNONAA >90 01/31/2013 0245   GFRAA >90 01/31/2013 0245    CBC    Component Value Date/Time   WBC 11.2* 01/31/2013 0245   RBC 3.70* 01/31/2013 0245   HGB 12.9* 01/31/2013 0245   HCT 37.6* 01/31/2013 0245   PLT 168 01/31/2013 0245   MCV 101.6* 01/31/2013 0245   MCH 34.9* 01/31/2013 0245   MCHC 34.3 01/31/2013 0245   RDW 13.3 01/31/2013 0245   LYMPHSABS 1.8 01/30/2013 0550   MONOABS 0.6 01/30/2013 0550   EOSABS 0.1 01/30/2013 0550   BASOSABS 0.0 01/30/2013 0550    CXR: NACPD   IMPRESSION:   Hemorrhagic stroke Intraventricular hemorrhage Intracranial hypertension Acute respiratory failure due to AMS Hypertension  Goal SBP 120-160 mmHg  PLAN:  Cont full vent support.  Daily WUA SBT as indicated Cont DVTp, SUP as ordered Begin TFs Neuro mgmt per Stroke team  Family updated @ bedside   30 mins CCM time    Billy Fischer, MD ; [facility] service Mobile [phone].  After 5:30 PM or weekends, call [phone]

## 2013-01-31 NOTE — Progress Notes (Signed)
Stroke Team Progress Note  HISTORY Rick Williams is an 47 y.o. male with a past medical history significant for hypertension, poorly adherent to treatment, ETOH use, stroke without apparent residual deficits, brought to [facility] ED by medics as a code stroke after being found on the floor with decreased responsiveness and left hemiplegia.  Rick Williams said that he hasn't been taking his BP medications for the last 3 months and at 2230 developed severe headache for which he took Benadryl to no avail. Then, his girlfriend reports finding him on the floor, less communicative, and not able to move the left side.  According to EMS his BP at the scene was 190/110 and he was completely hemiplegic in the left side. On route to the hospital he was responding only to sternal rub.  NIHSS 10. Emergent CT brain revealed a large right ICH centered around the BG, with intraventricular extension and 8 mm right to let midline shift.  After returning from CT he started to demonstrate declining mental status and the decision was made to proceed with early intubation. Still with SBP above 190 and nicardipine drip started.  Date last known well: 01/30/13  Time last known well: 2230   Patient was not a TPA candidate secondary to ICH. He was admitted to the neuro ICU for further evaluation and treatment.  SUBJECTIVE  Easily awakened. Can follow simple commands. Appears not to be in pain. Family at bedside. Cardene off. Follow up CT scan from yesterday evening as well as this morning both showed stable appearance of the basal ganglia hemorrhage without increasing midline shift or hydrocephalus.   OBJECTIVE Most recent Vital Signs: Filed Vitals:   01/31/13 0900 01/31/13 1000 01/31/13 1100 01/31/13 1259  BP: 145/84 139/84 135/83 136/81  Pulse: 69 60  54  Temp: 98.6 F (37 C) 98.8 F (37.1 C)    TempSrc: Core (Comment)     Resp: 19 16 16 16   Height: 5\' 10"  (1.778 m)     Weight: 69.4 kg (153 lb)     SpO2: 97% 98%   98%   CBG (last 3)   Recent Labs  01/30/13 0748  GLUCAP 136*    IV Fluid Intake:   . niCARDipine Stopped (01/30/13 2300)  . propofol 45 mcg/kg/min (01/31/13 1230)  . sodium chloride (hypertonic) 75 mL/hr at 01/31/13 1200    MEDICATIONS  . antiseptic oral rinse  15 mL Mouth Rinse QID  . chlorhexidine  15 mL Mouth Rinse BID  . feeding supplement (JEVITY 1.2 CAL)  1,000 mL Per Tube Q24H  . feeding supplement  60 mL Per Tube TID  . multivitamin  5 mL Per Tube Daily  . pantoprazole (PROTONIX) IV  40 mg Intravenous QHS  . senna-docusate  1 tablet Oral BID   PRN:  acetaminophen, acetaminophen, fentaNYL, labetalol, ondansetron (ZOFRAN) IV  Diet:    NPO- will add Jevity Activity:  Bedrest DVT Prophylaxis:  SCDs  CLINICALLY SIGNIFICANT STUDIES Basic Metabolic Panel:   Recent Labs Lab 01/30/13 0550 01/30/13 0558 01/30/13 1353  01/31/13 0245 01/31/13 0941  NA 139 140 140  < > 146* 153*  K 4.1 4.1 4.0  --  3.5  --   CL 100 103  --   --  114*  --   CO2 26  --   --   --  24  --   GLUCOSE 111* 109*  --   --  94  --   BUN 13 13  --   --  12  --   CREATININE 1.10 1.10  --   --  0.98  --   CALCIUM 9.7  --   --   --  7.8*  --   < > = values in this interval not displayed. Liver Function Tests:   Recent Labs Lab 01/30/13 0550  AST 53*  ALT 26  ALKPHOS 62  BILITOT 0.4  PROT 8.7*  ALBUMIN 3.5   CBC:   Recent Labs Lab 01/30/13 0550 01/30/13 0558 01/31/13 0245  WBC 8.6  --  11.2*  NEUTROABS 6.1  --   --   HGB 14.5 15.3 12.9*  HCT 40.1 45.0 37.6*  MCV 98.8  --  101.6*  PLT 199  --  168   Coagulation:   Recent Labs Lab 01/30/13 0550  LABPROT 13.5  INR 1.04   Cardiac Enzymes:   Recent Labs Lab 01/30/13 0550  TROPONINI <0.30   Urinalysis:   Recent Labs Lab 01/30/13 0721  COLORURINE YELLOW  LABSPEC 1.016  PHURINE 7.0  GLUCOSEU NEGATIVE  HGBUR MODERATE*  BILIRUBINUR NEGATIVE  KETONESUR NEGATIVE  PROTEINUR >300*  UROBILINOGEN 0.2  NITRITE  NEGATIVE  LEUKOCYTESUR NEGATIVE    Urine Drug Screen:     Component Value Date/Time   LABOPIA NONE DETECTED 01/30/2013 0738   COCAINSCRNUR NONE DETECTED 01/30/2013 0738   LABBENZ NONE DETECTED 01/30/2013 0738   AMPHETMU NONE DETECTED 01/30/2013 0738   THCU NONE DETECTED 01/30/2013 0738   LABBARB NONE DETECTED 01/30/2013 0738      CT of the brain   01/30/13 Intraparenchymal hemorrhage centered within the right basal ganglia, with intraventricular extension and 8 mm right to left  midline shift. 05/14/2014No increase in the parenchymal hemorrhage. Minimal increase in the blood in the posterior aspect of the  right lateral ventricle. No change in midline shift.  CXR - 5/13 ET tube tip is at the thoracic inlet. Recommend advancement  1.5-2.5 cm.  Lungs are clear  EKG : nonspecific T wave changes.    Physical Exam Intubated on vent.  Afebrile. Head is nontraumatic. Neck is supple without bruit.  . Cardiac exam no murmur or gallop. Lungs are clear to auscultation. Distal pulses are well felt.  Neurologic Exam:  Patient is sedated and intubated. Pupils are 3 mm equal reactive. Right gaze preference but can move the eyes to midline on reflex eye movements. Fundi were not visualized. Left lower facial weakness. Tongue is midline. Patient will  open eyes and follow commands. He has spontaneous right sided movements when stimulated and purposeful antigravity movements . Minimum withdrawal in the left lower extremity to painful stimuli. No withdrawal in the left upper extremity to painful stimuli. Plantar is upgoing. Right is downgoing.   ASSESSMENT Rick Williams is a 47 y.o. 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  Now has central line.   Hospital day # 1  TREATMENT/PLAN  Continue 3% saline at 75cc/hr for cerebral edema  Check Na q6hrs,  goal Na 150-155  Wean cardene off  GI ppx with Protonix IV  Keep normothermia and normoglycemia.  Check HbA1c, lipid panel in am.   Gwendolyn Lima. Manson Passey, [facility], MBA, MHA [facility] Pager: [phone] 01/31/2013 1:35 PM  This patient is critically ill and at significant risk of neurological worsening, death and care requires constant monitoring of vital signs, hemodynamics,respiratory and cardiac monitoring,review  of multiple databases, neurological assessment, discussion with family, other specialists and medical decision making of high complexity. I spent 30 minutes of neurocritical care time  in the care of  this patient.  I have personally obtained a history, examined the patient, evaluated imaging results, and formulated the assessment and plan of care. I agree with the above.  Delia Heady, MD

## 2013-01-31 NOTE — Progress Notes (Signed)
[facility] ADULT ICU REPLACEMENT PROTOCOL FOR AM LAB REPLACEMENT ONLY  The patient does apply for the [facility] Adult ICU Electrolyte Replacment Protocol based on the criteria listed below:   1. Is GFR >/= 40 ml/min? yes  Patient's GFR today is >90 2. Is urine output >/= 0.5 ml/kg/hr for the last 6 hours? yes Patient's UOP is .8 ml/kg/hr 3. Is BUN < 60 mg/dL? yes  Patient's BUN today is 12 4. Abnormal electrolyte(s):Potassium 5. Ordered repletion with:Potassium per Protocol 6. If a panic level lab has been reported, has the CCM MD in charge been notified? yes.   Physician:  Albon Patient is NPO Potassium ordered to be given VIA CVC  Kire Ferg P 01/31/2013 4:20 AM

## 2013-01-31 NOTE — Progress Notes (Signed)
INITIAL NUTRITION ASSESSMENT  DOCUMENTATION CODES Per approved criteria  -Not Applicable   INTERVENTION: Initiate Jevity 1.2 @ 15 ml/hr via OG tube and increase by 10 ml every 12 hours to goal rate of 25 ml/hr. 30 ml Prostat TID.  At goal rate, tube feeding regimen will provide 1320 kcal, 123 grams of protein, and 486 ml of H2O and 101 grams CHO.   Monitor magnesium, potassium, and phosphorus daily for at least 3 days, MD to replete as needed, as pt is at risk for refeeding syndrome given hx of weight loss and ETOH abuse.  MVI daily   NUTRITION DIAGNOSIS: Inadequate oral intake related to inability to eat as evidenced by NPO status.  Goal: Pt to meet >/= 90% of their estimated nutrition needs.   Monitor:  TF tolerance, weight trend, labs  Reason for Assessment: Consult received to initiate and manage enteral nutrition support.  47 y.o. 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 Per H&P pt has been off of his BP meds for 3 months. Pt also with hx of ETOH. Per wife pt has lost weight in part due to "marital trouble" that they had had a few months ago, during that time wife reports pt was not eating and could tell that he had lost weight. Pt also drinks ETOH daily. Per wife pt drinks a 40 oz beer daily. Per wife pt usually eats really well but does admit that he sometimes skips dinner after drinking. On Sunday pt drinks much more with his brother. 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 Pt with 11% weight loss x 9 months which does not quite meet level for malnutrition criteria.  Patient is currently intubated on ventilator support.  MV: 9.5 Temp:Temp (24hrs), Avg:99.4 F (37.4 C), Min:97.7 F (36.5 C), Max:101.1 F (38.4 C)  Propofol: 16.3 ml/hr providing 430 kcal from lipid per day  Nutrition Focused Physical  Exam:  Subcutaneous Fat:  Orbital Region: WNL Upper Arm Region: WNL Thoracic and Lumbar Region: WNL  Muscle:  Temple Region: mild-moderate wasting Clavicle Bone Region: mild-moderate wasting Clavicle and Acromion Bone Region: WNL Scapular Bone Region: WNL Dorsal Hand: WNL Patellar Region: WNL Anterior Thigh Region: WNL Posterior Calf Region: WNL  Edema: not present   Height: Ht Readings from Last 1 Encounters:  01/31/13 5\' 10"  (1.778 m)    Weight: Wt Readings from Last 1 Encounters:  01/31/13 153 lb (69.4 kg)    Ideal Body Weight: 75.4 kg  % Ideal Body Weight: 92%  Wt Readings from Last 10 Encounters:  01/31/13 153 lb (69.4 kg)  06/14/12 165 lb (74.844 kg)  05/15/12 172 lb (78.019 kg)    Usual Body Weight: 172 lb 8/13  % Usual Body Weight: 89%  BMI:  Body mass index is 21.95 kg/(m^2).  Estimated Nutritional Needs: Kcal: 1848 Protein: 95-110 grams Fluid: > 2L/day  Skin: no issues noted  Diet Order:   NPO   EDUCATION NEEDS: -No education needs identified at this time   Intake/Output Summary (Last 24 hours) at 01/31/13 1025 Last data filed at 01/31/13 0900  Gross per 24 hour  Intake 2856.32 ml  Output   1380 ml  Net 1476.32 ml    Last BM: PTA   Labs:   Recent Labs Lab 01/30/13 0550 01/30/13 0558 01/30/13 1353 01/30/13 2103 01/31/13 0245 01/31/13 0941  NA 139  140 140 144 146* 153*  K 4.1 4.1 4.0  --  3.5  --   CL 100 103  --   --  114*  --   CO2 26  --   --   --  24  --   BUN 13 13  --   --  12  --   CREATININE 1.10 1.10  --   --  0.98  --   CALCIUM 9.7  --   --   --  7.8*  --   GLUCOSE 111* 109*  --   --  94  --     CBG (last 3)   Recent Labs  01/30/13 0748  GLUCAP 136*   No results found for this basename: HGBA1C   Scheduled Meds: . antiseptic oral rinse  15 mL Mouth Rinse QID  . chlorhexidine  15 mL Mouth Rinse BID  . niCARDipine  5 mg/hr Intravenous Once  . pantoprazole (PROTONIX) IV  40 mg Intravenous QHS  .  senna-docusate  1 tablet Oral BID    Continuous Infusions: . feeding supplement (JEVITY 1.2 CAL)    . niCARDipine Stopped (01/30/13 2300)  . propofol 45 mcg/kg/min (01/31/13 0920)  . sodium chloride (hypertonic) 75 mL/hr at 01/31/13 0900    Past Medical History  Diagnosis Date  . Hypertension   . Pneumonia   . Hemothorax on left   . Stroke 2007    no deficits    History reviewed. No pertinent past surgical history.  Kendell Bane RD, LDN, CNSC [phone] Pager [phone] After Hours Pager

## 2013-01-31 NOTE — Progress Notes (Signed)
PT Cancellation Note  Patient Details Name: Rick Williams MRN: [id number] DOB: [birth year]   Cancelled Treatment:    Reason Eval/Treat Not Completed: Medical issues which prohibited therapy.  Still on vent.  Will check on 5/15. 01/31/2013  Edmond Bing, PT [phone]  (pager)   Himmat Enberg, Eliseo Gum 01/31/2013, 3:05 PM

## 2013-01-31 NOTE — Progress Notes (Signed)
Pt's serum Na level is 156. Stopped 3% NaCl and notified Dr. Pearlean Brownie. Dr. Pearlean Brownie ordered to restart 3% NaCl at 60 ml /hr if Serum Na is less than 150. Will continue to monitor.

## 2013-01-31 NOTE — Progress Notes (Signed)
SLP Cancellation Note  Patient Details Name: Rick Williams MRN: [id number] DOB: 07/14/1966   Cancelled treatment:       Reason Eval/Treat Not Completed: Medical issues which prohibited therapy (Remain on vent. Will f/u 5/15. )  [name] MA, CCC-SLP [phone]  Kelee Cunningham Meryl 01/31/2013, 7:37 AM

## 2013-02-01 LAB — BASIC METABOLIC PANEL
BUN: 19 mg/dL (ref 6–23)
CO2: 24 mEq/L (ref 19–32)
Calcium: 8.2 mg/dL — ABNORMAL LOW (ref 8.4–10.5)
Chloride: 123 mEq/L — ABNORMAL HIGH (ref 96–112)
Creatinine, Ser: 1.05 mg/dL (ref 0.50–1.35)
GFR calc Af Amer: >90 mL/min (ref >90)
GFR calc non Af Amer: 83 mL/min — ABNORMAL LOW (ref >90)
Glucose, Bld: 111 mg/dL — ABNORMAL HIGH (ref 70–99)
Potassium: 3.5 mEq/L (ref 3.5–5.1)
Sodium: 154 mEq/L — ABNORMAL HIGH (ref 135–145)

## 2013-02-01 LAB — HEMOGLOBIN A1C
Hgb A1c MFr Bld: 5.4 % (ref <5.7)
Mean Plasma Glucose: 108 mg/dL (ref <117)

## 2013-02-01 LAB — LIPID PANEL
Cholesterol: 169 mg/dL (ref 0–200)
HDL: 69 mg/dL (ref >39)
LDL Cholesterol: 73 mg/dL (ref 0–99)
Total CHOL/HDL Ratio: 2.4 RATIO
Triglycerides: 137 mg/dL (ref <150)
VLDL: 27 mg/dL (ref 0–40)

## 2013-02-01 LAB — CBC
HCT: 38 % — ABNORMAL LOW (ref 39.0–52.0)
Hemoglobin: 12.4 g/dL — ABNORMAL LOW (ref 13.0–17.0)
MCH: 34.4 pg — ABNORMAL HIGH (ref 26.0–34.0)
MCHC: 32.6 g/dL (ref 30.0–36.0)
MCV: 105.6 fL — ABNORMAL HIGH (ref 78.0–100.0)
Platelets: 170 10*3/uL (ref 150–400)
RBC: 3.6 MIL/uL — ABNORMAL LOW (ref 4.22–5.81)
RDW: 13.9 % (ref 11.5–15.5)
WBC: 10 10*3/uL (ref 4.0–10.5)

## 2013-02-01 LAB — PHOSPHORUS: Phosphorus: 2.4 mg/dL (ref 2.3–4.6)

## 2013-02-01 LAB — MAGNESIUM: Magnesium: 2.1 mg/dL (ref 1.5–2.5)

## 2013-02-01 LAB — SODIUM
Sodium: 153 mEq/L — ABNORMAL HIGH (ref 135–145)
Sodium: 153 mEq/L — ABNORMAL HIGH (ref 135–145)
Sodium: 150 mEq/L — ABNORMAL HIGH (ref 135–145)

## 2013-02-01 MED ORDER — FENTANYL CITRATE 0.05 MG/ML IJ SOLN
12.5000 ug | INTRAMUSCULAR | Status: DC | PRN
  Administered 2013-02-01 – 2013-02-02 (×5): 25 ug via INTRAVENOUS
  Filled 2013-02-01 (×5): qty 2

## 2013-02-01 MED ORDER — NICOTINE 14 MG/24HR TD PT24
14.0000 mg | MEDICATED_PATCH | Freq: Every day | TRANSDERMAL | Status: DC
  Administered 2013-02-01 – 2013-02-06 (×6): 14 mg via TRANSDERMAL
  Filled 2013-02-01 (×6): qty 1

## 2013-02-01 MED ORDER — POTASSIUM CHLORIDE 20 MEQ/15ML (10%) PO LIQD
20.0000 meq | ORAL | Status: AC
  Administered 2013-02-01 (×2): 20 meq
  Filled 2013-02-01 (×2): qty 15

## 2013-02-01 MED ORDER — HYDRALAZINE HCL 20 MG/ML IJ SOLN
10.0000 mg | INTRAMUSCULAR | Status: DC | PRN
  Administered 2013-02-01 – 2013-02-02 (×2): 20 mg via INTRAVENOUS
  Administered 2013-02-03 (×2): 10 mg via INTRAVENOUS
  Administered 2013-02-04 – 2013-02-05 (×3): 20 mg via INTRAVENOUS
  Filled 2013-02-01 (×6): qty 1

## 2013-02-01 MED ORDER — NICARDIPINE HCL IN NACL 20-0.86 MG/200ML-% IV SOLN
5.0000 mg/h | INTRAVENOUS | Status: DC
  Administered 2013-02-02: 5 mg/h via INTRAVENOUS
  Filled 2013-02-01 (×2): qty 200

## 2013-02-01 MED ORDER — ENOXAPARIN SODIUM 40 MG/0.4ML ~~LOC~~ SOLN
40.0000 mg | SUBCUTANEOUS | Status: DC
  Administered 2013-02-01 – 2013-02-06 (×6): 40 mg via SUBCUTANEOUS
  Filled 2013-02-01 (×6): qty 0.4

## 2013-02-01 NOTE — Procedures (Signed)
Extubation Procedure Note  Patient Details:   Name: Rick Williams DOB: 1966/02/04 MRN: [id number]   Airway Documentation:  Airway 8 mm (Active)  Secured at (cm) 24 cm 02/01/2013  8:16 AM  Measured From Lips 02/01/2013  8:16 AM  Secured Location Right 02/01/2013  8:16 AM  Secured By [employer] 02/01/2013  8:16 AM  Tube Holder Repositioned Yes 02/01/2013  8:16 AM  Cuff Pressure (cm H2O) 24 cm H2O 01/31/2013 11:25 PM  Site Condition Dry 02/01/2013  8:16 AM    Evaluation  O2 sats: stable throughout Complications: No apparent complications Patient did tolerate procedure well. Bilateral Breath Sounds: Rhonchi Suctioning: Oral;Airway Yes  Order to extubate per MD. Pt positive for cuff leak, and extubated to 3lpm [place]. No stridor or dyspnea noted after extubation. Pt has strong, productive cough. Vitals are currently within normal limits. RT will continue to monitor.   Beatris Si 02/01/2013, 9:56 AM

## 2013-02-01 NOTE — Progress Notes (Signed)
Hypertension; Received multiple doses of Labetalol during the day;   Nicardipine to be reinitiated for BP control.

## 2013-02-01 NOTE — Progress Notes (Signed)
[facility] ADULT ICU REPLACEMENT PROTOCOL FOR AM LAB REPLACEMENT ONLY  The patient does apply for the [facility] Adult ICU Electrolyte Replacment Protocol based on the criteria listed below:   1. Is GFR >/= 40 ml/min? yes  Patient's GFR today is 83 2. Is urine output >/= 0.5 ml/kg/hr for the last 6 hours? yes Patient's UOP is 0.8 ml/kg/hr 3. Is BUN < 60 mg/dL? yes  Patient's BUN today is 19 4. Abnormal electrolyte(s):Potassium 5. Ordered repletion with:Potassium per Protocol 6. If a panic level lab has been reported, has the CCM MD in charge been notified? yes.   Physician:  Truddie Coco P 02/01/2013 4:58 AM

## 2013-02-01 NOTE — Progress Notes (Signed)
Stroke Team Progress Note  HISTORY Rick Williams is an 47 y.o. male with a past medical history significant for hypertension, poorly adherent to treatment, ETOH use, stroke without apparent residual deficits, brought to [facility] ED by medics as a code stroke after being found on the floor with decreased responsiveness and left hemiplegia.  Rick Williams said that he hasn't been taking his BP medications for the last 3 months and at 2230 developed severe headache for which he took Benadryl to no avail. Then, his girlfriend reports finding him on the floor, less communicative, and not able to move the left side.  According to EMS his BP at the scene was 190/110 and he was completely hemiplegic in the left side. On route to the hospital he was responding only to sternal rub.  NIHSS 10. Emergent CT brain revealed a large right ICH centered around the BG, with intraventricular extension and 8 mm right to let midline shift.  After returning from CT he started to demonstrate declining mental status and the decision was made to proceed with early intubation. Still with SBP above 190 and nicardipine drip started.  Date last known well: 01/30/13  Time last known well: 2230   Patient was not a TPA candidate secondary to ICH. He was admitted to the neuro ICU for further evaluation and treatment.  SUBJECTIVE  Continue to improve with strength of the left side and ready for tube to come out.    OBJECTIVE Most recent Vital Signs: Filed Vitals:   02/01/13 0500 02/01/13 0600 02/01/13 0700 02/01/13 0800  BP: 131/78 136/86 145/91   Pulse: 58 53 56   Temp: 99 F (37.2 C)   98.3 F (36.8 C)  TempSrc:    Axillary  Resp: 16 16 16    Height:      Weight: 154 lb 1.6 oz (69.9 kg)     SpO2: 100% 100% 100%    CBG (last 3)   Recent Labs  01/30/13 0748  GLUCAP 136*    IV Fluid Intake:   . niCARDipine Stopped (01/30/13 2300)  . propofol 45 mcg/kg/min (02/01/13 0800)  . sodium chloride (hypertonic)       MEDICATIONS  . antiseptic oral rinse  15 mL Mouth Rinse QID  . chlorhexidine  15 mL Mouth Rinse BID  . feeding supplement (JEVITY 1.2 CAL)  1,000 mL Per Tube Q24H  . feeding supplement  60 mL Per Tube TID  . multivitamin  5 mL Per Tube Daily  . pantoprazole (PROTONIX) IV  40 mg Intravenous QHS  . potassium chloride  20 mEq Per Tube Q4H  . senna-docusate  1 tablet Oral BID   PRN:  acetaminophen, acetaminophen, fentaNYL, labetalol, ondansetron (ZOFRAN) IV  Diet:    NPO- on Jevity Activity:  Bedrest DVT Prophylaxis:  SCDs, still start Lovenox   CLINICALLY SIGNIFICANT STUDIES Basic Metabolic Panel:   Recent Labs Lab 01/31/13 0245  01/31/13 2145 02/01/13 0303  NA 146*  < > 153* 154*  K 3.5  --   --  3.5  CL 114*  --   --  123*  CO2 24  --   --  24  GLUCOSE 94  --   --  111*  BUN 12  --   --  19  CREATININE 0.98  --   --  1.05  CALCIUM 7.8*  --   --  8.2*  MG  --   --   --  2.1  PHOS  --   --   --  2.4  < > = values in this interval not displayed. Liver Function Tests:   Recent Labs Lab 01/30/13 0550  AST 53*  ALT 26  ALKPHOS 62  BILITOT 0.4  PROT 8.7*  ALBUMIN 3.5   CBC:   Recent Labs Lab 01/30/13 0550  01/31/13 0245 02/01/13 0500  WBC 8.6  --  11.2* 10.0  NEUTROABS 6.1  --   --   --   HGB 14.5  < > 12.9* 12.4*  HCT 40.1  < > 37.6* 38.0*  MCV 98.8  --  101.6* 105.6*  PLT 199  --  168 170  < > = values in this interval not displayed. Coagulation:   Recent Labs Lab 01/30/13 0550  LABPROT 13.5  INR 1.04   Cardiac Enzymes:   Recent Labs Lab 01/30/13 0550  TROPONINI <0.30   Urinalysis:   Recent Labs Lab 01/30/13 0721  COLORURINE YELLOW  LABSPEC 1.016  PHURINE 7.0  GLUCOSEU NEGATIVE  HGBUR MODERATE*  BILIRUBINUR NEGATIVE  KETONESUR NEGATIVE  PROTEINUR >300*  UROBILINOGEN 0.2  NITRITE NEGATIVE  LEUKOCYTESUR NEGATIVE    Urine Drug Screen:     Component Value Date/Time   LABOPIA NONE DETECTED 01/30/2013 0738   COCAINSCRNUR NONE  DETECTED 01/30/2013 0738   LABBENZ NONE DETECTED 01/30/2013 0738   AMPHETMU NONE DETECTED 01/30/2013 0738   THCU NONE DETECTED 01/30/2013 0738   LABBARB NONE DETECTED 01/30/2013 0738      CT of the brain   01/30/13 Intraparenchymal hemorrhage centered within the right basal ganglia, with intraventricular extension and 8 mm right to left  midline shift. 05/14/2014No increase in the parenchymal hemorrhage. Minimal increase in the blood in the posterior aspect of the  right lateral ventricle. No change in midline shift.  CXR - 5/13 ET tube tip is at the thoracic inlet. Recommend advancement  1.5-2.5 cm.  Lungs are clear  EKG : nonspecific T wave changes.    Physical Exam Intubated on vent.  Afebrile. Head is nontraumatic. Neck is supple without bruit.  . Cardiac exam no murmur or gallop. Lungs are clear to auscultation. Distal pulses are well felt.  Neurologic Exam:  Patient is alert and intubated. Pupils are 3 mm equal reactive. Right gaze preference but can move the eyes to midline on reflex eye movements. Fundi were not visualized. Left lower facial weakness. Tongue is midline. Patient will  open eyes and follow commands. He has spontaneous right sided movements when stimulated and purposeful antigravity movements . Improving left sided motor strength. Plantar is upgoing. Right is downgoing.   ASSESSMENT Rick Williams is a 47 y.o. 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  Now has central line. Clinically improving. LDL 73 HbA1c pending  Hospital day # 2  TREATMENT/PLAN  Off 3% saline . Na 154  Check Na q6hrs, goal Na 150-155  GI ppx with Protonix IV  Keep normothermia and normoglycemia.  Plan for extubation today. D/w Dr Bard Herbert  Start Lovenox for VTE ppx since it has been more than 48 hrs   Carrolyn Meiers, PGY-3 02/01/2013  8:08 AM  I have personally examined this patient, reviewed data, developed plan of care and agree with above. This patient is critically ill and at significant risk of neurological worsening, death and care requires constant monitoring of vital signs, hemodynamics,respiratory and cardiac monitoring,review of multiple databases, neurological assessment, discussion  with family, other specialists and medical decision making of high complexity. I spent 30 minutes of neurocritical care time  in the care of  this patient. Delia Heady, MD

## 2013-02-01 NOTE — Evaluation (Signed)
Clinical/Bedside Swallow Evaluation Patient Details  Name: Rick Williams MRN: [id number] Date of Birth: [birth year]  Today's Date: 02/01/2013 Time:  -     Past Medical History:  Past Medical History  Diagnosis Date  . Hypertension   . Pneumonia   . Hemothorax on left   . Stroke 2007    no deficits   Past Surgical History: History reviewed. No pertinent past surgical history. HPI:  61 M with PMH only of hypertension admitted via ED with hemorrhagic hypertensive CVA and progressive obtundation requiring intubation. Extubated 5/15.  5/13 CT head: Intraparenchymal hemorrhage centered within the right basal ganglia, with intraventricular extension and 8 mm right to left midline shift.  Pt asking repetitively for [facility].     Assessment / Plan / Recommendation Clinical Impression  Pt s/p right hemorrhagic CVA and intubation, presents today with dysphagia with delayed swallow response, signs of pharyngeal/laryngeal deficits leading to overt signs of aspiration.  Pt with inconsistent recall of results/recs, impaired insight, high impulsivity.  Rec continuing NPO status for now; allowing meds crushed with puree and limited amounts applesauce/pudding.  Will follow-up next date for improvements.      Aspiration Risk  Moderate    Diet Recommendation NPO except meds (may have limited amounts applesauce, pudding)   Medication Administration: Crushed with puree Supervision: Full supervision/cueing for compensatory strategies (highly impulsive) Postural Changes and/or Swallow Maneuvers: Seated upright 90 degrees    Other  Recommendations     Follow Up Recommendations  Inpatient Rehab    Frequency and Duration min 2x/week  2 weeks   Pertinent Vitals/Pain No pain   SLP Swallow Goals Patient will utilize recommended strategies during swallow to increase swallowing safety with: Modified independent assistance   Swallow Study Prior Functional Status  Type of Home: Apartment Lives  With: Spouse Available Help at Discharge: Family;Available 24 hours/day Vocation: Full time employment    General HPI: 54 M with PMH only of hypertension admitted via ED with hemorrhagic hypertensive CVA and progressive obtundation requiring intubation. Extubated 5/15.  5/13 CT head: Intraparenchymal hemorrhage centered within the right basal ganglia, with intraventricular extension and 8 mm right to left midline shift.  Pt asking repetitively for [facility].   Type of Study: Bedside swallow evaluation Previous Swallow Assessment: none Diet Prior to this Study: NPO Temperature Spikes Noted: No Respiratory Status: Room air History of Recent Intubation: Yes Length of Intubations (days): 2 days Date extubated: 02/01/13 Behavior/Cognition: Alert;Cooperative;Impulsive Oral Cavity - Dentition: Adequate natural dentition Self-Feeding Abilities: Needs assist Patient Positioning: Upright in chair Baseline Vocal Quality: Breathy Volitional Cough: Strong Volitional Swallow: Able to elicit    Oral/Motor/Sensory Function Overall Oral Motor/Sensory Function: Other (comment) (mild asymmetry left CN VII at rest)   Ice Chips Ice chips: Impaired Presentation: Spoon Pharyngeal Phase Impairments: Suspected delayed Swallow;Throat Clearing - Immediate   Thin Liquid Thin Liquid: Impaired Presentation: Cup;Spoon Pharyngeal  Phase Impairments: Suspected delayed Swallow;Multiple swallows;Wet Vocal Quality;Cough - Immediate Other Comments: explosive cough    Nectar Thick Nectar Thick Liquid: Not tested   Honey Thick Honey Thick Liquid: Not tested   Puree Puree: Impaired Presentation: Spoon Pharyngeal Phase Impairments: Suspected delayed Swallow;Multiple swallows   Solid   GO    Solid: Not tested      Rick Williams L. Rick Williams, [place] CCC/SLP Pager [phone]  Rick Williams 02/01/2013,3:32 PM

## 2013-02-01 NOTE — Progress Notes (Signed)
PULMONARY/CCM NOTE  Requesting MD/Service: Stroke/Sethi Date of admission: 5/13 Date of consult: 5/13 Reason for consultation: Vent mgmt, CVL for hypertonic saline  Pt Profile:  25 M with PMH only of hypertension admitted via ED with hemorrhagic hypertensive CVA and progressive obtundation requiring intubation. Hypertonic saline protocol ordered by Stroke team. PCCM asked to assit with vent/CCM mgmt and place CVL for 3% saline.  Studies/Events: 5/13 CT head: Intraparenchymal hemorrhage centered within the right basal ganglia, with intraventricular extension and 8 mm right to left midline shift 5/13 CT head: Slight increased sized right basal ganglia intraparenchymal hemorrhage. Unchanged 7 mm right-to-left shift 5/14 Ct head: No increase in the parenchymal hemorrhage. Minimal increase in the blood in the posterior aspect of the right lateral ventricle. No change in midline shift   Lines, Tubes, etc: ETT 5/13 >> 5/15 R Wisner CVL 5/13 >>   Microbiology:   Antibiotics:     Consults:  Stroke (primary)    Best Practice: DVT: LMWH + SCDs SUP: PPI Nutrition:  Glycemic control: N/I Sedation/analgesia: PRN fentanyl    SUBJ:  RASS 0. + F/C on WUA. W/D all extremities  Filed Vitals:   02/01/13 1100 02/01/13 1200 02/01/13 1300 02/01/13 1330  BP: 174/120 173/102 149/99 152/95  Pulse: 62 71 70 59  Temp: 99.1 F (37.3 C) 97.4 F (36.3 C)    TempSrc:  Oral    Resp: 15 19 20 20   Height:      Weight:      SpO2: 99% 100% 95% 96%    EXAM:  Gen: WDWN, intermittently agitated HEENT: pupils pinpoint, EOMI, [place]/AT Neck: No JVD, no LAN Lungs: Clear to auscultation Cardiovascular: RRR s M Abdomen: Soft, NT, NABS Rxt: warm, no edema Neuro: MAEs and withdraws from pain   DATA:   BMET    Component Value Date/Time   NA 153* 02/01/2013 0900   K 3.5 02/01/2013 0303   CL 123* 02/01/2013 0303   CO2 24 02/01/2013 0303   GLUCOSE 111* 02/01/2013 0303   BUN 19 02/01/2013 0303   CREATININE 1.05 02/01/2013 0303   CALCIUM 8.2* 02/01/2013 0303   GFRNONAA 83* 02/01/2013 0303   GFRAA >90 02/01/2013 0303    CBC    Component Value Date/Time   WBC 10.0 02/01/2013 0500   RBC 3.60* 02/01/2013 0500   HGB 12.4* 02/01/2013 0500   HCT 38.0* 02/01/2013 0500   PLT 170 02/01/2013 0500   MCV 105.6* 02/01/2013 0500   MCH 34.4* 02/01/2013 0500   MCHC 32.6 02/01/2013 0500   RDW 13.9 02/01/2013 0500   LYMPHSABS 1.8 01/30/2013 0550   MONOABS 0.6 01/30/2013 0550   EOSABS 0.1 01/30/2013 0550   BASOSABS 0.0 01/30/2013 0550    CXR: No new film   IMPRESSION:   Hemorrhagic stroke Intraventricular hemorrhage Intracranial hypertension Acute respiratory failure due to AMS Hypertension  Goal SBP 120-160 mmHg  PLAN:  Extubate 5/15 SLP, PT, OT ordered Cont DVTp, SUP as ordered Neuro mgmt per Stroke team  Family updated @ bedside   30 mins CCM time    Billy Fischer, MD ; [facility] service Mobile [phone].  After 5:30 PM or weekends, call [phone]

## 2013-02-01 NOTE — Progress Notes (Signed)
OT Cancellation Note  Treatment cancelled today due to medical issues with patient which prohibited therapy:  Pt remains on vent and with bedrest orders.  Plans to extubate later today. Will re-attempt later today as time allows.  02/01/2013 Cipriano Mile OTR/L Pager [phone] Office [phone]

## 2013-02-01 NOTE — Progress Notes (Signed)
SLP Cancellation Note  Patient Details Name: Rick Williams MRN: [id number] DOB: 06-25-1966   Cancelled treatment:       Reason Eval/Treat Not Completed: Medical issues which prohibited therapy (Remains on vent. Signing off. Re-consult when ready. )  [name] MA, CCC-SLP [phone]  [name] 02/01/2013, 9:15 AM

## 2013-02-01 NOTE — Evaluation (Signed)
Physical Therapy Evaluation Patient Details Name: Rick Williams MRN: [id number] DOB: December 30, 1965 Today's Date: 02/01/2013 Time: 1430-1456 PT Time Calculation (min): 26 min  PT Assessment / Plan / Recommendation Clinical Impression  Patient is a 47 y/o male admitted with hemorrhagic BG CVA with midline shift.  He presents with decreased awareness and strength left side, decreased static and dynamic balance, decreased safety awareness with impulsivity, decreased attention and  will benefit from skilled PT in the acute setting to maxmize independence and allow return home after CIR stay.    PT Assessment  Patient needs continued PT services    Follow Up Recommendations  CIR    Does the patient have the potential to tolerate intense rehabilitation    yes  Barriers to Discharge  none      Equipment Recommendations  Other (comment) (TBA)    Recommendations for Other Services Rehab consult   Frequency Min 4X/week    Precautions / Restrictions Precautions Precautions: Fall Precaution Comments: decreased left side awareness Restrictions Weight Bearing Restrictions: No   Pertinent Vitals/Pain C/o headache 10/10 RN aware      Mobility  Bed Mobility Bed Mobility: Supine to Sit;Sitting - Scoot to Edge of Bed Supine to Sit: 4: Min guard Sitting - Scoot to [place] of Bed: 4: Min guard Details for Bed Mobility Assistance: leans to left Transfers Transfers: Sit to Stand;Stand to Sit Sit to Stand: 4: Min assist;From bed Stand to Sit: 4: Min assist;To chair/3-in-1 Details for Transfer Assistance: for safety, lifting help up from bed; mod cues for safety; pt impulsive Ambulation/Gait Ambulation/Gait Assistance: 3: Mod assist Ambulation Distance (Feet): 60 Feet Assistive device: 1 person hand held assist Ambulation/Gait Assistance Details: facilitation for increased left clearance and cues due to dragging left foot.  Increased awareness and improved clearance with cues and  facilitation for right weight shift Gait Pattern: Step-through pattern;Decreased hip/knee flexion - left;Decreased step length - left;Decreased dorsiflexion - left;Trunk rotated posteriorly on left Modified Rankin (Stroke Patients Only) Pre-Morbid Rankin Score: No symptoms Modified Rankin: Moderately severe disability        PT Diagnosis: Abnormality of gait;Hemiplegia non-dominant side;Altered mental status  PT Problem List: Decreased strength;Decreased mobility;Decreased balance;Impaired sensation;Decreased cognition;Decreased safety awareness;Decreased knowledge of precautions PT Treatment Interventions: DME instruction;Gait training;Cognitive remediation;Neuromuscular re-education;Balance training;Functional mobility training;Patient/family education;Therapeutic activities;Therapeutic exercise   PT Goals Acute Rehab PT Goals PT Goal Formulation: With patient Time For Goal Achievement: 02/15/13 Potential to Achieve Goals: Good Pt will go Supine/Side to Sit: with supervision PT Goal: Supine/Side to Sit - Progress: Goal set today Pt will go Sit to Stand: with supervision PT Goal: Sit to Stand - Progress: Goal set today Pt will Transfer Bed to Chair/Chair to Bed: with supervision PT Transfer Goal: Bed to Chair/Chair to Bed - Progress: Goal set today Pt will Stand: with supervision;with unilateral upper extremity support;3 - 5 min PT Goal: Stand - Progress: Goal set today Pt will Ambulate: >150 feet;with least restrictive assistive device;with supervision PT Goal: Ambulate - Progress: Goal set today Additional Goals Additional Goal #1: Patient to demonstrate increased left side awareness by ability to complete simple ADL task with supervision and min questioning cues for left UE management. PT Goal: Additional Goal #1 - Progress: Goal set today  Visit Information  Last PT Received On: 02/01/13 Assistance Needed: +2    Subjective Data  Subjective: I just need a [facility] Patient  Stated Goal: To go home   Prior Functioning  Home Living Lives With: Spouse Available Help at  Discharge: Family;Available 24 hours/day Type of Home: Apartment Home Access: Stairs to enter Entrance Stairs-Number of Steps: 2 flilghts Entrance Stairs-Rails: Can reach both;Left;Right Home Layout: One level Bathroom Shower/Tub: Forensic scientist: Standard Home Adaptive Equipment: None Prior Function Level of Independence: Independent Able to Take Stairs?: Yes Driving: No Vocation: Full time employment Communication Communication: No difficulties Dominant Hand: Right    Cognition  Cognition Arousal/Alertness: Awake/alert Behavior During Therapy: Impulsive Overall Cognitive Status: Impaired/Different from baseline Area of Impairment: Orientation;Attention;Safety/judgement;Problem solving Orientation Level: Disoriented to;Situation;Time Current Attention Level: Focused Safety/Judgement: Decreased awareness of safety;Decreased awareness of deficits Problem Solving: Difficulty sequencing;Requires verbal cues;Requires tactile cues General Comments: Focused on getting [facility]; difficulty managing left UE and when putting on sock needed assist to get left UE in proper position due to lack of awareness    Extremity/Trunk Assessment Right Upper Extremity Assessment RUE ROM/Strength/Tone: Within functional levels Left Upper Extremity Assessment LUE ROM/Strength/Tone: Deficits LUE ROM/Strength/Tone Deficits: decreased awareness of left side; strength shoulder flexion 3+/5, elbow flexion 4-/5 Right Lower Extremity Assessment RLE ROM/Strength/Tone: Within functional levels Left Lower Extremity Assessment LLE ROM/Strength/Tone: Deficits LLE ROM/Strength/Tone Deficits: hip flexion 3+/5, knee extension 4/5, ankle dorsiflexion 4/5 LLE Sensation: Deficits LLE Sensation Deficits: accurate with 2/3 areas touching with light touch (likely due more to awareness than  sensation)   Balance Balance Balance Assessed: Yes Dynamic Sitting Balance Dynamic Sitting - Balance Support: During functional activity;Feet supported;No upper extremity supported Dynamic Sitting - Level of Assistance: 4: Min assist Dynamic Sitting - Balance Activities: Forward lean/weight shifting;Reaching across midline Dynamic Sitting - Comments: leaning to left in chair trying to put on socks  End of Session PT - End of Session Equipment Utilized During Treatment: Gait belt Activity Tolerance: Patient tolerated treatment well Patient left: in chair;with call bell/phone within reach;Other (comment) (SLP in room) Nurse Communication: Mobility status  GP     [facility] 02/01/2013, 5:20 PM  [place],  [phone] 02/01/2013

## 2013-02-01 NOTE — Progress Notes (Signed)
Notified by RN that pt fell. He was getting up out of the chair to have a smoke. He hit is shoulder against the wall. He head was laying up against the wall. Immediate assessment by her shows no change in neuro status from pre-fall.  Will increase neuro checks and vital signs post fall.  Annie Main, MSN, RN, ANVP-BC, ANP-BC, GNP-BC [facility] Pager: [phone] 02/01/2013 3:50 PM

## 2013-02-01 NOTE — Progress Notes (Signed)
At approximately 1525 the patient was sitting in the chair and asking if he could go outside and smoke a cigarette. I informed the patient that he was in the hospital and was not able to go outside to smoke. Then I asked how much he smoke and if he wanted a nicotine patch. Stepped out of the room to notify Annie Main, NP to request an order for a nicotine patch. At 1538 there was a loud noise and I looked up and the patient was lying on the floor with his head against the wall. While assisting the patient back to bed I asked what he was trying to do and he stated " to go outside and smoke a cigarette". An immediate neuro assessment was completed; no neuro status changes. Pt is A&OX4, purposeful movement, and follows commands. Notified Annie Main, NP who was on the unit at that time. Order to increase neuro checks and vital signs; nursing care order placed. Will continue to monitor.

## 2013-02-02 ENCOUNTER — Inpatient Hospital Stay (HOSPITAL_COMMUNITY): Payer: MEDICAID

## 2013-02-02 LAB — BASIC METABOLIC PANEL
BUN: 9 mg/dL (ref 6–23)
CO2: 24 mEq/L (ref 19–32)
Calcium: 9 mg/dL (ref 8.4–10.5)
Chloride: 111 mEq/L (ref 96–112)
Creatinine, Ser: 0.89 mg/dL (ref 0.50–1.35)
GFR calc Af Amer: >90 mL/min (ref >90)
GFR calc non Af Amer: >90 mL/min (ref >90)
Glucose, Bld: 112 mg/dL — ABNORMAL HIGH (ref 70–99)
Potassium: 3 mEq/L — ABNORMAL LOW (ref 3.5–5.1)
Sodium: 146 mEq/L — ABNORMAL HIGH (ref 135–145)

## 2013-02-02 LAB — MAGNESIUM: Magnesium: 1.5 mg/dL (ref 1.5–2.5)

## 2013-02-02 LAB — PHOSPHORUS: Phosphorus: 2.9 mg/dL (ref 2.3–4.6)

## 2013-02-02 MED ORDER — RESOURCE THICKENUP CLEAR PO POWD
ORAL | Status: DC | PRN
  Filled 2013-02-02: qty 125

## 2013-02-02 MED ORDER — TRAMADOL HCL 50 MG PO TABS
100.0000 mg | ORAL_TABLET | Freq: Four times a day (QID) | ORAL | Status: DC | PRN
  Administered 2013-02-02 – 2013-02-05 (×6): 100 mg via ORAL
  Filled 2013-02-02 (×9): qty 2

## 2013-02-02 MED ORDER — CLONIDINE HCL 0.2 MG PO TABS
0.2000 mg | ORAL_TABLET | Freq: Two times a day (BID) | ORAL | Status: DC
  Administered 2013-02-02 – 2013-02-06 (×7): 0.2 mg via ORAL
  Filled 2013-02-02 (×11): qty 1

## 2013-02-02 MED ORDER — SODIUM CHLORIDE 0.9 % IV SOLN
INTRAVENOUS | Status: DC
  Administered 2013-02-01: 20 mL/h via INTRAVENOUS
  Administered 2013-02-02 – 2013-02-04 (×2): via INTRAVENOUS
  Administered 2013-02-04: 20 mL/h via INTRAVENOUS
  Administered 2013-02-05: 14:00:00 via INTRAVENOUS

## 2013-02-02 MED ORDER — POTASSIUM CHLORIDE 10 MEQ/50ML IV SOLN
10.0000 meq | INTRAVENOUS | Status: AC
  Administered 2013-02-02 (×4): 10 meq via INTRAVENOUS
  Filled 2013-02-02: qty 200
  Filled 2013-02-02: qty 50

## 2013-02-02 MED ORDER — GUAIFENESIN 100 MG/5ML PO SOLN
5.0000 mL | ORAL | Status: DC | PRN
  Administered 2013-02-03 – 2013-02-05 (×5): 100 mg via ORAL
  Filled 2013-02-02 (×6): qty 5

## 2013-02-02 MED ORDER — STARCH (THICKENING) PO POWD: ORAL | Status: DC | PRN

## 2013-02-02 NOTE — Progress Notes (Signed)
Physical Therapy Treatment Patient Details Name: Rick Williams MRN: [id number] DOB: January 16, 1966 Today's Date: 02/02/2013 Time: 4098-1191 PT Time Calculation (min): 30 min  PT Assessment / Plan / Recommendation Comments on Treatment Session  Patient with ICH with Lt weakness/decreased awareness.  Patient remains impulsive with decreased safety awareness.  Balance remains poor in standing, impacting gait.  Agree with need for Inpatient Rehab stay prior to discharge.    Follow Up Recommendations  CIR     Does the patient have the potential to tolerate intense rehabilitation     Barriers to Discharge        Equipment Recommendations  Other (comment) (TBD)    Recommendations for Other Services Rehab consult  Frequency Min 4X/week   Plan Discharge plan remains appropriate;Frequency remains appropriate    Precautions / Restrictions Precautions Precautions: Fall Precaution Comments: decreased left side awareness Restrictions Weight Bearing Restrictions: No   Pertinent Vitals/Pain     Mobility  Bed Mobility Bed Mobility: Supine to Sit;Sitting - Scoot to [place] of Bed;Sit to Supine Supine to Sit: 4: Min guard Sitting - Scoot to [place] of Bed: 4: Min assist Sit to Supine: 4: Min assist Details for Bed Mobility Assistance: Verbal cues for sequencing Transfers Transfers: Sit to Stand;Stand to Sit Sit to Stand: 3: Mod assist;From bed Stand to Sit: 3: Mod assist;To bed Details for Transfer Assistance: Assist for steadying due to left side weakness. Ambulation/Gait Ambulation/Gait Assistance: 3: Mod assist (+1 for lines) Ambulation Distance (Feet): 64 Feet Assistive device: Rolling walker Ambulation/Gait Assistance Details: Physical assist and verbal cues to shift weight at hips to right.  Verbal cues to take longer step with LLE.  Assist to keep Lt hand on RW.  Cues to hold head up and stand upright during gait - patient with flexed posture.  Requires 4 standing rest breaks to  "regroup" and reposition to continue gait.   Gait Pattern: Step-through pattern;Decreased stance time - left;Decreased step length - left;Decreased step length - right;Decreased weight shift to right;Ataxic;Trunk flexed;Narrow base of support Gait velocity: Slow gait speed General Gait Details: Ataxic movement with decreased awareness of Lt side during gait. Modified Rankin (Stroke Patients Only) Pre-Morbid Rankin Score: No symptoms Modified Rankin: Moderately severe disability      PT Goals Acute Rehab PT Goals Pt will go Supine/Side to Sit: with supervision PT Goal: Supine/Side to Sit - Progress: Progressing toward goal Pt will go Sit to Stand: with supervision PT Goal: Sit to Stand - Progress: Progressing toward goal Pt will Transfer Bed to Chair/Chair to Bed: with supervision PT Transfer Goal: Bed to Chair/Chair to Bed - Progress: Progressing toward goal Pt will Stand: with supervision;with unilateral upper extremity support;3 - 5 min PT Goal: Stand - Progress: Progressing toward goal Pt will Ambulate: >150 feet;with least restrictive assistive device;with supervision PT Goal: Ambulate - Progress: Progressing toward goal  Visit Information  Last PT Received On: 02/02/13 Assistance Needed: +2 PT/OT Co-Evaluation/Treatment: Yes    Subjective Data  Subjective: "I'm ready to get up"   Cognition  Cognition Arousal/Alertness: Awake/alert Behavior During Therapy: Impulsive Overall Cognitive Status: Impaired/Different from baseline Area of Impairment: Orientation;Attention;Safety/judgement;Problem solving Orientation Level: Disoriented to;Situation;Time Current Attention Level: Focused Safety/Judgement: Decreased awareness of safety;Decreased awareness of deficits Problem Solving: Difficulty sequencing;Requires verbal cues;Requires tactile cues    Balance  Balance Balance Assessed: Yes Static Sitting Balance Static Sitting - Balance Support: No upper extremity supported;Feet  supported Static Sitting - Level of Assistance: 5: Stand by assistance Static Sitting - Comment/#  of Minutes: close supervision for safety due to pt very impulsive Static Standing Balance Static Standing - Balance Support: Bilateral upper extremity supported Static Standing - Level of Assistance: 3: Mod assist Static Standing - Comment/# of Minutes: Manual and verbal cueing to obtain midline orientation while UEs supported on RW. Cues through left hip and bil shoulders in order to minimize left lateral lean.  End of Session PT - End of Session Equipment Utilized During Treatment: Gait belt Activity Tolerance: Patient tolerated treatment well;Patient limited by fatigue Patient left: in bed;with call bell/phone within reach;with bed alarm set;with restraints reapplied;with family/visitor present Nurse Communication: Mobility status   GP     Vena Austria 02/02/2013, 11:24 AM Durenda Hurt. Renaldo Fiddler, [facility] Acute Rehab Services Pager [phone]

## 2013-02-02 NOTE — Consult Note (Signed)
Physical Medicine and Rehabilitation Consult Reason for Consult: Right basal ganglia ICH Referring Physician: Dr. Pearlean Brownie   HPI: Rick Williams is a 47 y.o. 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 Patient independent prior to admission working full-time. Admitted 01/30/2013 of left-sided weakness and altered mental status. Reports the patient had not been taking his blood pressure medications for 3 months. He was found on the floor by his wife. Blood pressure 190/110. 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 Patient was placed on the nicardipine drip for blood pressure control. Neurology services followup with conservative care. Followup cranial CT scan 01/31/2013 without change. Subcutaneous Lovenox initiated 02/01/2013 for DVT prophylaxis. NicoDerm patch added for history of tobacco abuse. Patient was maintained on ventilator support for a short time followed by critical care medicine. Physical therapy evaluation completed 02/01/2013 as well as occupational therapy with noted decrease in balance and being impulsive. Recommendations made for physical medicine rehabilitation consult to consider inpatient rehabilitation services.   Review of Systems  Gastrointestinal: Positive for nausea.  Neurological: Positive for weakness and headaches.  All other systems reviewed and are negative.   Past Medical History  Diagnosis Date  . Hypertension   . Pneumonia   . Hemothorax on left   . Stroke 2007    no deficits   History reviewed. No pertinent past surgical history. History reviewed. No pertinent family history. Social History:  reports that he has been smoking Cigarettes.  He has been smoking about 1.00 pack per day. He does not have any smokeless tobacco history on file. He reports that  drinks alcohol. He  reports that he does not use illicit drugs. Allergies:  Allergies  Allergen Reactions  . Benadryl (Diphenhydramine Hcl) Itching   No prescriptions prior to admission    Home: Home Living Lives With: Spouse Available Help at Discharge: Family;Available 24 hours/day Type of Home: Apartment Home Access: Stairs to enter Entrance Stairs-Number of Steps: 2 flilghts Entrance Stairs-Rails: Can reach both;Left;Right Home Layout: One level Bathroom Shower/Tub: Forensic scientist: Standard Home Adaptive Equipment: None  Functional History: Prior Function Able to Take Stairs?: Yes Driving: No Vocation: Full time employment Functional Status:  Mobility: Bed Mobility Bed Mobility: Supine to Sit;Sitting - Scoot to Edge of Bed Supine to Sit: 4: Min guard Sitting - Scoot to [place] of Bed: 4: Min guard Transfers Transfers: Sit to Stand;Stand to Sit Sit to Stand: 4: Min assist;From bed Stand to Sit: 4: Min assist;To chair/3-in-1 Ambulation/Gait Ambulation/Gait Assistance: 3: Mod assist Ambulation Distance (Feet): 60 Feet Assistive device: 1 person hand held assist Ambulation/Gait Assistance Details: facilitation for increased left clearance and cues due to dragging left foot.  Increased awareness and improved clearance with cues and facilitation for right weight shift Gait Pattern: Step-through pattern;Decreased hip/knee flexion - left;Decreased step length - left;Decreased dorsiflexion - left;Trunk rotated posteriorly on left    ADL:    Cognition: Cognition Overall Cognitive Status: Impaired/Different from baseline Arousal/Alertness: Awake/alert Orientation Level: Oriented to person;Oriented to place;Oriented to time;Oriented to situation Cognition Arousal/Alertness: Awake/alert Behavior During Therapy: Impulsive Overall Cognitive Status: Impaired/Different from baseline Area of Impairment: Orientation;Attention;Safety/judgement;Problem solving Orientation  Level: Disoriented to;Situation;Time Current Attention Level: Focused Safety/Judgement: Decreased awareness of safety;Decreased awareness of deficits Problem Solving: Difficulty sequencing;Requires verbal cues;Requires tactile cues General Comments: Focused on getting [facility]; difficulty managing left UE and when  putting on sock needed assist to get left UE in proper position due to lack of awareness  Blood pressure 127/97, pulse 75, temperature 98 F (36.7 C), temperature source Axillary, resp. rate 24, height 5\' 10"  (1.778 m), weight 64.9 kg (143 lb 1.3 oz), SpO2 100.00%. Physical Exam  Vitals reviewed. Constitutional: He is oriented to person, place, and time.  HENT:  Head: Normocephalic.  Eyes:  Pupils round and reactive to light  Neck: Neck supple. No thyromegaly present.  Cardiovascular: Normal rate and regular rhythm.   Pulmonary/Chest: Effort normal and breath sounds normal. No respiratory distress.  Abdominal: Soft. Bowel sounds are normal. He exhibits no distension.  Musculoskeletal: He exhibits no edema.  Neurological: He is alert and oriented to person, place, and time.  Flat affect. Decreased awareness of left-sided weakness. Follow simple commands. Left central 7 and tongue deviation. Dysarthric speech. LUE is 3/5 prox to distal. LLE is 3 to 3+. Sensation is 1+ on left. Cognitively with fair insight and awareness.   Skin: Skin is warm and dry.  Psychiatric: He has a normal mood and affect. His behavior is normal.    Results for orders placed during the hospital encounter of 01/30/13 (from the past 24 hour(s))  SODIUM     Status: Abnormal   Collection Time    02/01/13  3:03 PM      Result Value Range   Sodium 153 (*) 135 - 145 mEq/L  SODIUM     Status: Abnormal   Collection Time    02/01/13 10:15 PM      Result Value Range   Sodium 150 (*) 135 - 145 mEq/L  PHOSPHORUS     Status: None   Collection Time    02/02/13  5:15 AM      Result Value Range   Phosphorus  2.9  2.3 - 4.6 mg/dL  MAGNESIUM     Status: None   Collection Time    02/02/13  5:15 AM      Result Value Range   Magnesium 1.5  1.5 - 2.5 mg/dL  BASIC METABOLIC PANEL     Status: Abnormal   Collection Time    02/02/13  5:15 AM      Result Value Range   Sodium 146 (*) 135 - 145 mEq/L   Potassium 3.0 (*) 3.5 - 5.1 mEq/L   Chloride 111  96 - 112 mEq/L   CO2 24  19 - 32 mEq/L   Glucose, Bld 112 (*) 70 - 99 mg/dL   BUN 9  6 - 23 mg/dL   Creatinine, Ser 1.61  0.50 - 1.35 mg/dL   Calcium 9.0  8.4 - 09.6 mg/dL   GFR calc non Af Amer >90  >90 mL/min   GFR calc Af Amer >90  >90 mL/min   No results found.  Assessment/Plan: Diagnosis: right BG hemorrhage 1. Does the need for close, 24 hr/day medical supervision in concert with the patient's rehab needs make it unreasonable for this patient to be served in a less intensive setting? Yes 2. Co-Morbidities requiring supervision/potential complications: htn 3. Due to bladder management, bowel management, safety, skin/wound care, disease management, medication administration and patient education, does the patient require 24 hr/day rehab nursing? Yes 4. Does the patient require coordinated care of a physician, rehab nurse, PT (1-2 hrs/day, 5 days/week), OT (1-2 hrs/day, 5 days/week) and SLP (1-2 hrs/day, 5 days/week) to address physical and functional deficits in the context of the above medical diagnosis(es)? Yes Addressing deficits in  the following areas: balance, endurance, locomotion, strength, transferring, bowel/bladder control, bathing, dressing, feeding, grooming, toileting, cognition, speech, swallowing and psychosocial support 5. Can the patient actively participate in an intensive therapy program of at least 3 hrs of therapy per day at least 5 days per week? Yes 6. The potential for patient to make measurable gains while on inpatient rehab is excellent 7. Anticipated functional outcomes upon discharge from inpatient rehab are mod I to  supervision with PT, mod I to supervision with OT, mod I to supervision with SLP. 8. Estimated rehab length of stay to reach the above functional goals is: 9-12 days 9. Does the patient have adequate social supports to accommodate these discharge functional goals? Yes 10. Anticipated D/C setting: Home 11. Anticipated post D/C treatments: HH therapy 12. Overall Rehab/Functional Prognosis: excellent  RECOMMENDATIONS: This patient's condition is appropriate for continued rehabilitative care in the following setting: CIR Patient has agreed to participate in recommended program. Yes Note that insurance prior authorization may be required for reimbursement for recommended care.  Comment:Rehab RN to follow up.   Ranelle Oyster, MD, [place] [facility]     02/02/2013

## 2013-02-02 NOTE — Progress Notes (Signed)
Has tolerated extubation well Ambulating No resp distress Will D/C CVL as no further hypertonic saline planned and has excellent PIV targets Plan for transfer to med-surg noted PCCM will sign off. Please call if we can be of further assistance   Billy Fischer, MD ; [facility] [phone].  After 5:30 PM or weekends, call [phone]

## 2013-02-02 NOTE — Progress Notes (Signed)
[facility] Nursing ICU Electrolyte Replacement Protocol  Patient Name: Rick Williams DOB: [birth year] MRN: [id number]  Date of Service  02/02/2013 K+ 3.0  Replaced per protocol.  MD notified  HPI/Events of Note    Recent Labs Lab 01/30/13 0550 01/30/13 0558  01/31/13 0245  01/31/13 2145 02/01/13 0303 02/01/13 0900 02/01/13 1503 02/01/13 2215 02/02/13 0515  NA 139 140  < > 146*  < > 153* 154* 153* 153* 150* 146*  K 4.1 4.1  < > 3.5  --   --  3.5  --   --   --  3.0*  CL 100 103  --  114*  --   --  123*  --   --   --  111  CO2 26  --   --  24  --   --  24  --   --   --  24  GLUCOSE 111* 109*  --  94  --   --  111*  --   --   --  112*  BUN 13 13  --  12  --   --  19  --   --   --  9  CREATININE 1.10 1.10  --  0.98  --   --  1.05  --   --   --  0.89  CALCIUM 9.7  --   --  7.8*  --   --  8.2*  --   --   --  9.0  MG  --   --   --   --   --   --  2.1  --   --   --  1.5  PHOS  --   --   --   --   --   --  2.4  --   --   --  2.9  < > = values in this interval not displayed.  Estimated Creatinine Clearance: 95.2 ml/min (by C-G formula based on Cr of 0.89).  Intake/Output     05/15 0701 - 05/16 0700   I.V. (mL/kg) 292.9 (4.5)   NG/GT 25   Total Intake(mL/kg) 317.9 (4.9)   Urine (mL/kg/hr) 875 (0.6)   Total Output 875   Net -557.2       Urine Occurrence 5 x    - I/O DETAILED x24h    Total I/O In: 256.7 [I.V.:256.7] Out: 200 [Urine:200] - I/O THIS SHIFT    ASSESSMENT   eICURN Interventions     ASSESSMENT: MAJOR ELECTROLYTE    Merita Norton 02/02/2013, 6:30 AM

## 2013-02-02 NOTE — Procedures (Signed)
Objective Swallowing Evaluation: Modified Barium Swallowing Study  Patient Details  Name: Rick Williams MRN: [id number] Date of Birth: 11-01-1965  Today's Date: 02/02/2013 Time: 1340-1410 SLP Time Calculation (min): 30 min  Past Medical History:  Past Medical History  Diagnosis Date  . Hypertension   . Pneumonia   . Hemothorax on left   . Stroke 2007    no deficits   Past Surgical History: History reviewed. No pertinent past surgical history. HPI:  86 M with PMH only of hypertension admitted via ED with CT brain revealed a large right ICH centered around the BG, with intraventricular extension and 8 mm right to left midline shift.  Required intubation. Extubated 5/15.  Bedside swallow eval completed 5/15 with recs for NPO except limited amounts puree.  Assessment of PO readiness this am revealed persisting signs of dysphagia with recs for MBS.       Assessment / Plan / Recommendation Clinical Impression  Dysphagia Diagnosis: Moderate pharyngeal phase dysphagia  Clinical impression: Pt presents with moderate pharyngeal dysphagia marked by sensorimotor deficits that impact timing of swallow sequence and lead to immediate aspiration of nectar-thick liquids (aspiration is not consistently accompanied by a cough response.)  Postural adjustments exacerbated aspiration.  Pt remains a moderate aspiration risk, heightened by his impulsivity and impaired insight.    Recommend initiating a dysphagia 3 diet with HONEY thick liquids only; meds crushed in puree.  SLP will follow for dysphagia tx.  Please consider ordering cogn-linguistic eval as well.    Treatment Recommendation  Therapy as outlined in treatment plan below    Diet Recommendation Dysphagia 3 (Mechanical Soft);Honey-thick liquid   Liquid Administration via: Cup Medication Administration: Crushed with puree Supervision: Full supervision/cueing for compensatory strategies Compensations: Slow rate;Small sips/bites;Clear throat  intermittently Postural Changes and/or Swallow Maneuvers: Seated upright 90 degrees    Other  Recommendations Oral Care Recommendations: Oral care BID Other Recommendations: Order thickener from pharmacy   Follow Up Recommendations  Inpatient Rehab    Frequency and Duration min 2x/week  2 weeks       SLP Swallow Goals Patient will utilize recommended strategies during swallow to increase swallowing safety with: Modified independent assistance Swallow Study Goal #2 - Progress: Progressing toward goal   General  Diet Prior to this Study: NPO Temperature Spikes Noted: No Respiratory Status: Room air History of Recent Intubation: Yes Length of Intubations (days): 2 days Date extubated: 02/01/13 Behavior/Cognition: Alert;Cooperative;Impulsive Oral Cavity - Dentition: Adequate natural dentition Oral Motor / Sensory Function:  (asymmetry CN VII on left) Self-Feeding Abilities: Able to feed self Patient Positioning: Upright in chair Baseline Vocal Quality: Hoarse;Low vocal intensity Volitional Cough: Strong Volitional Swallow: Able to elicit Anatomy: Within functional limits Pharyngeal Secretions: Not observed secondary MBS    Reason for Referral Objectively evaluate swallowing function   Oral Phase Oral Preparation/Oral Phase Oral Phase: WFL   Pharyngeal Phase Pharyngeal Phase Pharyngeal Phase: Impaired Pharyngeal - Honey Pharyngeal - Honey Cup: Delayed swallow initiation Pharyngeal - Nectar Pharyngeal - Nectar Cup: Delayed swallow initiation;Reduced airway/laryngeal closure;Penetration/Aspiration before swallow;Penetration/Aspiration during swallow;Compensatory strategies attempted (Comment) (head turn and chin tuck ineffective) Penetration/Aspiration details (nectar cup): Material enters airway, passes BELOW cords and not ejected out despite cough attempt by patient;Material enters airway, passes BELOW cords without attempt by patient to eject out (silent  aspiration) Pharyngeal - Solids Pharyngeal - Puree: Premature spillage to valleculae;Pharyngeal residue - valleculae Pharyngeal - Mechanical Soft: Delayed swallow initiation;Pharyngeal residue - valleculae  Cervical Esophageal Phase    GO  Rick Williams, [place] CCC/SLP Pager [phone]  Cervical Esophageal Phase Cervical Esophageal Phase: Rick Williams         Rick Williams Rick Williams 02/02/2013, 4:04 PM

## 2013-02-02 NOTE — Plan of Care (Signed)
Pt is transferred from 3100 via wheelchair. Pt is alert and oriented x 4. Pain is 5/10. Will give pain meds. Instructed pt to remain npo except ice chips until further orders, v/u. Pt is stable. Spouse at bedside. Bed alarm in place.

## 2013-02-02 NOTE — Evaluation (Signed)
Occupational Therapy Evaluation Patient Details Name: Rick Williams MRN: [id number] DOB: 1966-02-19 Today's Date: 02/02/2013 Time: 5621-3086 OT Time Calculation (min): 30 min  OT Assessment / Plan / Recommendation Clinical Impression  Pt  admitted with hemorrhagic BG CVA with midline shift. Will benefit from acute OT services to address below problem list. Recommending CIR to further progress rehab before eventual return home.    OT Assessment  Patient needs continued OT Services    Follow Up Recommendations  CIR;Supervision/Assistance - 24 hour    Barriers to Discharge      Equipment Recommendations  Tub/shower bench    Recommendations for Other Services Rehab consult  Frequency  Min 3X/week    Precautions / Restrictions Precautions Precautions: Fall Precaution Comments: decreased left side awareness Restrictions Weight Bearing Restrictions: No   Pertinent Vitals/Pain See vitals    ADL  Eating/Feeding: NPO Upper Body Bathing: Simulated;Moderate assistance Where Assessed - Upper Body Bathing: Unsupported sitting Lower Body Bathing: Simulated;Moderate assistance Where Assessed - Lower Body Bathing: Supported sit to stand Upper Body Dressing: Performed;Moderate assistance Where Assessed - Upper Body Dressing: Unsupported sitting Lower Body Dressing: Simulated;Moderate assistance Where Assessed - Lower Body Dressing: Supported sit to Pharmacist, hospital: Simulated;Moderate assistance Toilet Transfer Method:  (ambulating) Toilet Transfer Equipment:  (bed to ambulate in hall then return to bed) Equipment Used: Gait belt;Rolling walker Transfers/Ambulation Related to ADLs: Mod assist with RW. Manual cues through left hip and bil shoulders for midline positioning and for advancing LLE.  Pt with heavy left lateral lean. Also assist to attend to LUE on RW ( L UE occasionally falling to side while trying to push RW). ADL Comments: Pt requiring assist to attend to left  side, especially L UE positioning.     OT Diagnosis: Generalized weakness;Cognitive deficits;Paresis;Ataxia  OT Problem List: Decreased strength;Decreased activity tolerance;Impaired balance (sitting and/or standing);Decreased coordination;Decreased cognition;Decreased safety awareness;Decreased knowledge of use of DME or AE;Impaired UE functional use;Impaired sensation OT Treatment Interventions: Self-care/ADL training;Therapeutic exercise;Neuromuscular education;DME and/or AE instruction;Therapeutic activities;Patient/family education;Balance training;Cognitive remediation/compensation   OT Goals Acute Rehab OT Goals OT Goal Formulation: With patient Time For Goal Achievement: 02/16/13 Potential to Achieve Goals: Good ADL Goals Pt Will Perform Grooming: with min assist;Standing at sink;Other (comment) (incorporating use of LUE for 2 tasks) ADL Goal: Grooming - Progress: Goal set today Pt Will Perform Upper Body Bathing: with supervision;Sitting, chair;Sitting, edge of bed ADL Goal: Upper Body Bathing - Progress: Goal set today Pt Will Perform Upper Body Dressing: with supervision;Sitting, chair;Sitting, bed ADL Goal: Upper Body Dressing - Progress: Goal set today Pt Will Transfer to Toilet: with min assist;Ambulation;Comfort height toilet ADL Goal: Toilet Transfer - Progress: Goal set today Pt Will Perform Toileting - Clothing Manipulation: with min assist;Standing ADL Goal: Toileting - Clothing Manipulation - Progress: Goal set today Miscellaneous OT Goals Miscellaneous OT Goal #1: Pt will perform static standing balance task >5 min with supervision as precursor for ADLs. OT Goal: Miscellaneous Goal #1 - Progress: Goal set today Miscellaneous OT Goal #2: Pt will reach for and retrieve 3 ADL items using LUE with supervision. OT Goal: Miscellaneous Goal #2 - Progress: Goal set today Miscellaneous OT Goal #3: Pt will consistentnly attend to LUE postioning during rest and functional  tasks. OT Goal: Miscellaneous Goal #3 - Progress: Goal set today  Visit Information  Last OT Received On: 02/02/13 Assistance Needed: +2 PT/OT Co-Evaluation/Treatment: Yes    Subjective Data  Subjective: "I want a Mt Dew"   Prior Functioning  Home Living Lives With: Spouse Available Help at Discharge: Family;Available 24 hours/day Type of Home: Apartment Home Access: Stairs to enter Entrance Stairs-Number of Steps: 2 flilghts Entrance Stairs-Rails: Can reach both;Left;Right Home Layout: One level Bathroom Shower/Tub: Forensic scientist: Standard Home Adaptive Equipment: None Prior Function Level of Independence: Independent Able to Take Stairs?: Yes Driving: No Vocation: Full time employment Communication Communication: No difficulties Dominant Hand: Right         Vision/Perception Vision - History Baseline Vision: No visual deficits Patient Visual Report: No change from baseline Vision - Assessment Vision Assessment: Vision not tested Additional Comments: Vision not formally tested. Pt able to located objects in all fields (clocks, calendars, signs, etc) without verbal cueing.  Will continue to assess for any changes. Perception Perception: Impaired Inattention/Neglect: Does not attend to left side of body Spatial Orientation: Pt requires verbal and visual cueing to attend to LUE positioning.  Pt often unaware of LUE under his body in bed or falling off RW.   Cognition  Cognition Arousal/Alertness: Awake/alert Behavior During Therapy: Impulsive Overall Cognitive Status: Impaired/Different from baseline Area of Impairment: Orientation;Attention;Safety/judgement;Problem solving Orientation Level: Disoriented to;Situation;Time Current Attention Level: Focused Safety/Judgement: Decreased awareness of safety;Decreased awareness of deficits Problem Solving: Difficulty sequencing;Requires verbal cues;Requires tactile cues     Extremity/Trunk Assessment Right Upper Extremity Assessment RUE ROM/Strength/Tone: Within functional levels Left Upper Extremity Assessment LUE ROM/Strength/Tone: Deficits LUE ROM/Strength/Tone Deficits: Hand, wrist and elbow 4-/5.  Shoulder 3+/5.  LUE Sensation: Deficits LUE Sensation Deficits: decreased sensation to light touch LUE Coordination: Deficits LUE Coordination Deficits: ataxic     Mobility Bed Mobility Bed Mobility: Supine to Sit;Sitting - Scoot to [place] of Bed;Sit to Supine Supine to Sit: 4: Min guard Sitting - Scoot to [place] of Bed: 4: Min assist Sit to Supine: 4: Min assist Details for Bed Mobility Assistance: Verbal cues for sequencing Transfers Transfers: Sit to Stand;Stand to Sit Sit to Stand: 3: Mod assist;From bed Stand to Sit: 3: Mod assist;To bed Details for Transfer Assistance: Assist for steadying due to left side weakness.     Exercise     Balance Balance Balance Assessed: Yes Static Sitting Balance Static Sitting - Balance Support: No upper extremity supported;Feet supported Static Sitting - Level of Assistance: 5: Stand by assistance Static Sitting - Comment/# of Minutes: close supervision for safety due to pt very impulsive Static Standing Balance Static Standing - Balance Support: Bilateral upper extremity supported Static Standing - Level of Assistance: 3: Mod assist Static Standing - Comment/# of Minutes: Manual and verbal cueing to obtain midline orientation while UEs supported on RW. Cues through left hip and bil shoulders in order to minimize left lateral lean.   End of Session OT - End of Session Equipment Utilized During Treatment: Gait belt Activity Tolerance: Patient tolerated treatment well Patient left: in bed;with call bell/phone within reach;with restraints reapplied;with family/visitor present Nurse Communication: Mobility status  GO   02/02/2013 Cipriano Mile OTR/L Pager [phone] Office [phone]   Cipriano Mile 02/02/2013, 11:29 AM

## 2013-02-02 NOTE — Progress Notes (Signed)
Rehab Admissions Coordinator Note:  Patient was screened by Clois Dupes for appropriateness for an Inpatient Acute Rehab Consult.  At this time, we are recommending Inpatient Rehab consult.  Clois Dupes 02/02/2013, 10:08 AM  I can be reached at [phone].

## 2013-02-02 NOTE — Progress Notes (Signed)
Speech Language Pathology Dysphagia Treatment Patient Details Name: Rick Williams MRN: [id number] DOB: [birth year] Today's Date: 02/02/2013 Time: 1600-1630 SLP Time Calculation (min): 30 min  Assessment / Plan / Recommendation Clinical Impression  F/u with family/pt to educate re: dysphagia dx after MBS.  Pt's wife and family with many questions re: swallowing precautions.  Reviewed/demonstrated precautions, necessity of honey-thick liquids and supervision given pt's cognitive deficits, poor insight, impulsivity.  Pt tolerated honey-thick liquids, but with difficulty understanding rationale, poorer attention, and more cueing required this afternoon.  Pt's step-mother stepped out of room to advise this clinician and RN of pt's daily alcohol use and her concerns that he may be going through withdrawal.   Explained that I would convey concerns to medical staff.       Diet Recommendation  Initiate / Change Diet: Dysphagia 3 (mechanical soft);Honey-thick liquid    SLP Plan Continue with current plan of care   Pertinent Vitals/Pain No pain   Swallowing Goals  SLP Swallowing Goals Patient will utilize recommended strategies during swallow to increase swallowing safety with: Modified independent assistance Swallow Study Goal #2 - Progress: Progressing toward goal  General Temperature Spikes Noted: No Respiratory Status: Room air Behavior/Cognition: Confused;Agitated;Impulsive Oral Cavity - Dentition: Adequate natural dentition Patient Positioning: Upright in bed  Oral Cavity - Oral Hygiene Does patient have any of the following "at risk" factors?: Other - dysphagia   Dysphagia Treatment Treatment focused on: Skilled observation of diet tolerance;Patient/family/caregiver Dealer Educated: wife and extended family Treatment Methods/Modalities: Skilled observation Patient observed directly with PO's: Yes Type of PO's observed: Honey-thick liquids Feeding: Needs  assist Liquids provided via: Cup;Teaspoon Pharyngeal Phase Signs & Symptoms: Multiple swallows Type of cueing: Verbal Amount of cueing: Moderate   GO    Myeshia Fojtik L. Samson Frederic, [place] CCC/SLP Pager [phone]  [name] [name] 02/02/2013, 4:40 PM

## 2013-02-02 NOTE — Plan of Care (Signed)
Spoke with sharon biby, NP and orders received.

## 2013-02-02 NOTE — Progress Notes (Signed)
Stroke Team Progress Note  HISTORY Rick Williams is an 47 y.o. male with a past medical history significant for hypertension, poorly adherent to treatment, ETOH use, stroke without apparent residual deficits, brought to [facility] ED by medics as a code stroke after being found on the floor with decreased responsiveness and left hemiplegia.  Rick Williams said that he hasn't been taking his BP medications for the last 3 months and at 2230 developed severe headache for which he took Benadryl to no avail. Then, his girlfriend reports finding him on the floor, less communicative, and not able to move the left side.  According to EMS his BP at the scene was 190/110 and he was completely hemiplegic in the left side. On route to the hospital he was responding only to sternal rub.  NIHSS 10. Emergent CT brain revealed a large right ICH centered around the BG, with intraventricular extension and 8 mm right to let midline shift.  After returning from CT he started to demonstrate declining mental status and the decision was made to proceed with early intubation. Still with SBP above 190 and nicardipine drip started.  Date last known well: 01/30/13  Time last known well: 2230   Patient was not a TPA candidate secondary to ICH. He was admitted to the neuro ICU for further evaluation and treatment.  SUBJECTIVE  He continues to feel better and is able to move his left side a little more.  He is ready for swallow eval.extubated y`day and doing well.     OBJECTIVE Most recent Vital Signs: Filed Vitals:   02/02/13 0530 02/02/13 0600 02/02/13 0630 02/02/13 0700  BP: 141/87 134/69 143/71 135/91  Pulse: 73 62 60 69  Temp:      TempSrc:      Resp: 20 22 23 15   Height:      Weight:      SpO2: 93% 93% 95% 97%   CBG (last 3)  No results found for this basename: GLUCAP,  in the last 72 hours  IV Fluid Intake:   . sodium chloride 20 mL/hr at 02/02/13 0700  . niCARDipine 3 mg/hr (02/02/13 0500)  . sodium  chloride (hypertonic)      MEDICATIONS  . antiseptic oral rinse  15 mL Mouth Rinse QID  . chlorhexidine  15 mL Mouth Rinse BID  . enoxaparin (LOVENOX) injection  40 mg Subcutaneous Q24H  . nicotine  14 mg Transdermal Daily  . pantoprazole (PROTONIX) IV  40 mg Intravenous QHS  . potassium chloride  10 mEq Intravenous Q1 Hr x 4  . senna-docusate  1 tablet Oral BID   PRN:  acetaminophen, acetaminophen, fentaNYL, hydrALAZINE, labetalol, ondansetron (ZOFRAN) IV  Diet:  NPO NPO, swallow eval today Activity:  OOB, Pt/OT DVT Prophylaxis:  Lovenox   CLINICALLY SIGNIFICANT STUDIES Basic Metabolic Panel:   Recent Labs Lab 02/01/13 0303  02/01/13 2215 02/02/13 0515  NA 154*  < > 150* 146*  K 3.5  --   --  3.0*  CL 123*  --   --  111  CO2 24  --   --  24  GLUCOSE 111*  --   --  112*  BUN 19  --   --  9  CREATININE 1.05  --   --  0.89  CALCIUM 8.2*  --   --  9.0  MG 2.1  --   --  1.5  PHOS 2.4  --   --  2.9  < > = values in  this interval not displayed. Liver Function Tests:   Recent Labs Lab 01/30/13 0550  AST 53*  ALT 26  ALKPHOS 62  BILITOT 0.4  PROT 8.7*  ALBUMIN 3.5   CBC:   Recent Labs Lab 01/30/13 0550  01/31/13 0245 02/01/13 0500  WBC 8.6  --  11.2* 10.0  NEUTROABS 6.1  --   --   --   HGB 14.5  < > 12.9* 12.4*  HCT 40.1  < > 37.6* 38.0*  MCV 98.8  --  101.6* 105.6*  PLT 199  --  168 170  < > = values in this interval not displayed. Coagulation:   Recent Labs Lab 01/30/13 0550  LABPROT 13.5  INR 1.04   Cardiac Enzymes:   Recent Labs Lab 01/30/13 0550  TROPONINI <0.30   Urinalysis:   Recent Labs Lab 01/30/13 0721  COLORURINE YELLOW  LABSPEC 1.016  PHURINE 7.0  GLUCOSEU NEGATIVE  HGBUR MODERATE*  BILIRUBINUR NEGATIVE  KETONESUR NEGATIVE  PROTEINUR >300*  UROBILINOGEN 0.2  NITRITE NEGATIVE  LEUKOCYTESUR NEGATIVE    Urine Drug Screen:     Component Value Date/Time   LABOPIA NONE DETECTED 01/30/2013 0738   COCAINSCRNUR NONE DETECTED  01/30/2013 0738   LABBENZ NONE DETECTED 01/30/2013 0738   AMPHETMU NONE DETECTED 01/30/2013 0738   THCU NONE DETECTED 01/30/2013 0738   LABBARB NONE DETECTED 01/30/2013 0738      CT of the brain   01/30/13 Intraparenchymal hemorrhage centered within the right basal ganglia, with intraventricular extension and 8 mm right to left  midline shift. 05/14/2014No increase in the parenchymal hemorrhage. Minimal increase in the blood in the posterior aspect of the  right lateral ventricle. No change in midline shift.  CXR - 5/13 ET tube tip is at the thoracic inlet. Recommend advancement  1.5-2.5 cm.  Lungs are clear  EKG : nonspecific T wave changes.    Physical Exam  .  Afebrile. Head is nontraumatic. Neck is supple without bruit.  . Cardiac exam no murmur or gallop. Lungs are clear to auscultation. Distal pulses are well felt.  Neurologic Exam:  Patient is alert ,awake and oriented. . Pupils are 3 mm equal reactive. Right gaze preference but can move the eyes to midline on reflex eye movements. Fundi were not visualized. Left lower facial weakness. Tongue is midline. Patient will  open eyes and follow commands. He has spontaneous right sided movements when stimulated and purposeful antigravity movements . Improving left sided motor strength now 3/5. Plantar is upgoing. Right is downgoing.   ASSESSMENT Rick Williams is a 47 y.o. 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  Now has central line. Clinically improving. LDL 73 HbA1c 5.4  Hospital day # 3  TREATMENT/PLAN  D/C 3% saline. Na 146  D/C Check Na q6hrs  Restart home BP med: Clonidine 0.2mg  bid  PRN hydralazine 10-40mg  for SBP >180  GI ppx with Protonix IV. If pass swallow eval, can switch to PO  Keep normothermia and normoglycemia.       Continue Lovenox       Consult to  inpatient rehab       PT/OT       Transfer to floor   Carrolyn Meiers, PGY-3 02/02/2013 8:11 AM  I have personally examined this patient, reviewed data, developed plan of care and agree with above. Delia Heady, MD

## 2013-02-02 NOTE — Plan of Care (Signed)
Pt would like to go outside to get some fresh air via wheelchair. Instructed pt that [name] not get off the floor without MD order. Safety purposes. Dr Pearlean Brownie paged and waiting for md to call back.

## 2013-02-02 NOTE — Progress Notes (Signed)
Speech Language Pathology Dysphagia Treatment Patient Details Name: Rick Williams MRN: [id number] DOB: [birth year] Today's Date: 02/02/2013 Time: 1037-1100 SLP Time Calculation (min): 23 min  Assessment / Plan / Recommendation Clinical Impression  F/u to determine readiness for POs: Pt continues to present with overt signs of aspiration with thin and nectar-thick liquids.  Consumption followed by explosive coughing.  Purees are tolerated well with indication of likely pharyngeal residue but functional swallow.  Recommend proceeding with MBS for definitive assessment and recs.  Continue PRN purees, no liquids.  Pt with good recall of recs but minimal insight into rationale.  Wife verbalizes understanding.     Diet Recommendation  Continue with Current Diet: NPO    SLP Plan Continue with current plan of care;MBS   Pertinent Vitals/Pain No pain   Swallowing Goals  SLP Swallowing Goals Patient will utilize recommended strategies during swallow to increase swallowing safety with: Modified independent assistance Swallow Study Goal #2 - Progress: Progressing toward goal  General Temperature Spikes Noted: No Respiratory Status: Room air Behavior/Cognition: Alert;Cooperative;Impulsive Oral Cavity - Dentition: Adequate natural dentition Patient Positioning: Upright in bed  Oral Cavity - Oral Hygiene Does patient have any of the following "at risk" factors?: Other - dysphagia Patient is HIGH RISK - Oral Care Protocol followed (see row info): Yes Patient is AT RISK - Oral Care Protocol followed (see row info): Yes Patient is mechanically ventilated, follow VAP prevention protocol for oral care: Oral care provided every 4 hours   Dysphagia Treatment Treatment focused on: Upgraded PO texture trials;Patient/family/caregiver education;Skilled observation of diet tolerance Family/Caregiver Educated: wife Treatment Methods/Modalities: Skilled observation;Differential diagnosis Patient  observed directly with PO's: Yes Type of PO's observed: Dysphagia 1 (puree);Thin liquids;Nectar-thick liquids Feeding: Needs assist Liquids provided via: Cup;Teaspoon Pharyngeal Phase Signs & Symptoms: Multiple swallows;Wet vocal quality;Immediate throat clear;Immediate cough Type of cueing: Verbal Amount of cueing: Minimal   GO   Siniyah Evangelist L. Samson Frederic, [place] CCC/SLP Pager [phone]   [name] [name] 02/02/2013, 11:09 AM

## 2013-02-02 NOTE — Progress Notes (Signed)
UR completed 

## 2013-02-02 NOTE — Progress Notes (Signed)
NUTRITION FOLLOW UP  Intervention:   1. If pt must remain NPO recommend:  Initiate Jevity 1.2 @ 20 ml/hr via NG tube and increase by 10 ml every 12 hours to goal rate of 65 ml/hr. 30 ml Prostat BID.  At goal rate, tube feeding regimen will provide 2028 kcal, 116 grams of protein, and 1263 ml of H2O.   2. Monitor magnesium, potassium, and phosphorus daily for at least 3 days, MD to replete as needed, as pt is at risk for refeeding syndrome given hx of weight loss and ETOH.   Nutrition Dx:   Inadequate oral intake related to inability to eat as evidenced by NPO status; ongoing.   Goal:   Pt to meet >/= 90% of their estimated nutrition needs; not met.   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 Pt also with ETOH hx.  Pt extubated 5/15. Pt sleepy after working with therapy. Wife at bedside. Pt confused and in Posy vest.  Admission and current weight 10 lb difference, will monitor.   Height: Ht Readings from Last 1 Encounters:  02/02/13 5\' 10"  (1.778 m)    Weight Status:   Wt Readings from Last 1 Encounters:  02/02/13 143 lb 1.3 oz (64.9 kg)  Admission weight 153 lb  Re-estimated needs:  Kcal: 2000-2200 Protein: 100-120 grams  Fluid: > 2 L/day  Skin: no issues noted  Diet Order: NPO   Intake/Output Summary (Last 24 hours) at 02/02/13 1509 Last data filed at 02/02/13 1200  Gross per 24 hour  Intake  624.5 ml  Output    600 ml  Net   24.5 ml    Last BM: PTA   Labs:   Recent Labs Lab 01/31/13 0245  01/31/13 2145 02/01/13 0303  02/01/13 1503 02/01/13 2215 02/02/13 0515  NA 146*  < > 153* 154*  < > 153* 150* 146*  K 3.5  --   --  3.5  --   --   --  3.0*  CL 114*  --   --  123*  --   --   --  111  CO2 24  --   --  24  --   --   --  24  BUN 12  --   --  19  --   --   --  9  CREATININE 0.98  --   --  1.05  --   --   --  0.89   CALCIUM 7.8*  --   --  8.2*  --   --   --  9.0  MG  --   --   --  2.1  --   --   --  1.5  PHOS  --   --   --  2.4  --   --   --  2.9  GLUCOSE 94  --   --  111*  --   --   --  112*  < > = values in this interval not displayed.  CBG (last 3)  No results found for this basename: GLUCAP,  in the last 72 hours  Scheduled Meds: . antiseptic oral rinse  15 mL Mouth Rinse QID  . chlorhexidine  15 mL Mouth Rinse BID  . cloNIDine  0.2 mg Oral BID  . enoxaparin (LOVENOX) injection  40 mg Subcutaneous Q24H  . nicotine  14 mg Transdermal Daily  . pantoprazole (PROTONIX) IV  40 mg Intravenous QHS  . senna-docusate  1 tablet Oral BID    Continuous Infusions: . sodium chloride 20 mL/hr at 02/02/13 [facility]    Kendell Bane RD, LDN, CNSC [phone] Pager [phone] After Hours Pager

## 2013-02-03 DIAGNOSIS — I619 Nontraumatic intracerebral hemorrhage, unspecified: Secondary | ICD-10-CM

## 2013-02-03 MED ORDER — POTASSIUM CHLORIDE 20 MEQ PO PACK: 20.0000 meq | PACK | Freq: Two times a day (BID) | ORAL | Status: DC

## 2013-02-03 MED ORDER — VITAMIN B-1 100 MG PO TABS
100.0000 mg | ORAL_TABLET | Freq: Every day | ORAL | Status: DC
  Administered 2013-02-03 – 2013-02-06 (×3): 100 mg via ORAL
  Filled 2013-02-03 (×4): qty 1

## 2013-02-03 MED ORDER — HYDROMORPHONE HCL PF 1 MG/ML IJ SOLN
2.0000 mg | Freq: Four times a day (QID) | INTRAMUSCULAR | Status: DC | PRN
  Administered 2013-02-03 – 2013-02-05 (×5): 2 mg via INTRAVENOUS
  Filled 2013-02-03 (×5): qty 2

## 2013-02-03 MED ORDER — PANTOPRAZOLE SODIUM 40 MG PO TBEC
40.0000 mg | DELAYED_RELEASE_TABLET | Freq: Every day | ORAL | Status: DC
  Administered 2013-02-03 – 2013-02-06 (×3): 40 mg via ORAL
  Filled 2013-02-03 (×3): qty 1

## 2013-02-03 MED ORDER — POTASSIUM CHLORIDE CRYS ER 20 MEQ PO TBCR
20.0000 meq | EXTENDED_RELEASE_TABLET | Freq: Two times a day (BID) | ORAL | Status: DC
  Administered 2013-02-03 – 2013-02-06 (×5): 20 meq via ORAL
  Filled 2013-02-03 (×8): qty 1

## 2013-02-03 MED ORDER — PROSIGHT PO TABS
1.0000 | ORAL_TABLET | Freq: Every day | ORAL | Status: DC
  Administered 2013-02-03 – 2013-02-06 (×3): 1 via ORAL
  Filled 2013-02-03 (×4): qty 1

## 2013-02-03 NOTE — Progress Notes (Signed)
Stroke Team Progress Note  HISTORY Rick Williams is an 47 y.o. male with a past medical history significant for hypertension, poorly adherent to treatment, ETOH use, stroke without apparent residual deficits, brought to [facility] ED by medics as a code stroke after being found on the floor with decreased responsiveness and left hemiplegia.  Rick Williams said that he hasn't been taking his BP medications for the last 3 months and at 2230 developed severe headache for which he took Benadryl to no avail. Then, his girlfriend reports finding him on the floor, less communicative, and not able to move the left side.  According to EMS his BP at the scene was 190/110 and he was completely hemiplegic in the left side. On route to the hospital he was responding only to sternal rub.  NIHSS 10. Emergent CT brain revealed a large right ICH centered around the BG, with intraventricular extension and 8 mm right to let midline shift.  After returning from CT he started to demonstrate declining mental status and the decision was made to proceed with early intubation. Still with SBP above 190 and nicardipine drip started.  Date last known well: 01/30/13  Time last known well: 2230   Patient was not a TPA candidate secondary to ICH. He was admitted to the neuro ICU for further evaluation and treatment.  SUBJECTIVE  He is now eating, but he had some nausea and vomiting this morning.   OBJECTIVE Most recent Vital Signs: Filed Vitals:   02/02/13 2055 02/03/13 0210 02/03/13 0528 02/03/13 0726  BP: 145/91 150/92 163/97 150/80  Pulse: 68 53 64   Temp: 98.3 F (36.8 C) 98.6 F (37 C) 98.2 F (36.8 C)   TempSrc: Oral Oral Oral   Resp: 20 20 20    Height:      Weight:      SpO2: 98% 99% 100%    CBG (last 3)  No results found for this basename: GLUCAP,  in the last 72 hours  IV Fluid Intake:   . sodium chloride 20 mL/hr at 02/02/13 0825    MEDICATIONS  . antiseptic oral rinse  15 mL Mouth Rinse QID  .  chlorhexidine  15 mL Mouth Rinse BID  . cloNIDine  0.2 mg Oral BID  . enoxaparin (LOVENOX) injection  40 mg Subcutaneous Q24H  . nicotine  14 mg Transdermal Daily  . pantoprazole (PROTONIX) IV  40 mg Intravenous QHS  . senna-docusate  1 tablet Oral BID   PRN:  acetaminophen, acetaminophen, guaiFENesin, hydrALAZINE, ondansetron (ZOFRAN) IV, RESOURCE THICKENUP CLEAR, traMADol  Diet:  Dysphagia NPO, swallow eval today Activity:  OOB, Pt/OT DVT Prophylaxis:  Lovenox   CLINICALLY SIGNIFICANT STUDIES Basic Metabolic Panel:   Recent Labs Lab 02/01/13 0303  02/01/13 2215 02/02/13 0515  NA 154*  < > 150* 146*  K 3.5  --   --  3.0*  CL 123*  --   --  111  CO2 24  --   --  24  GLUCOSE 111*  --   --  112*  BUN 19  --   --  9  CREATININE 1.05  --   --  0.89  CALCIUM 8.2*  --   --  9.0  MG 2.1  --   --  1.5  PHOS 2.4  --   --  2.9  < > = values in this interval not displayed. Liver Function Tests:   Recent Labs Lab 01/30/13 0550  AST 53*  ALT 26  ALKPHOS  62  BILITOT 0.4  PROT 8.7*  ALBUMIN 3.5   CBC:   Recent Labs Lab 01/30/13 0550  01/31/13 0245 02/01/13 0500  WBC 8.6  --  11.2* 10.0  NEUTROABS 6.1  --   --   --   HGB 14.5  < > 12.9* 12.4*  HCT 40.1  < > 37.6* 38.0*  MCV 98.8  --  101.6* 105.6*  PLT 199  --  168 170  < > = values in this interval not displayed. Coagulation:   Recent Labs Lab 01/30/13 0550  LABPROT 13.5  INR 1.04   Cardiac Enzymes:   Recent Labs Lab 01/30/13 0550  TROPONINI <0.30   Urinalysis:   Recent Labs Lab 01/30/13 0721  COLORURINE YELLOW  LABSPEC 1.016  PHURINE 7.0  GLUCOSEU NEGATIVE  HGBUR MODERATE*  BILIRUBINUR NEGATIVE  KETONESUR NEGATIVE  PROTEINUR >300*  UROBILINOGEN 0.2  NITRITE NEGATIVE  LEUKOCYTESUR NEGATIVE    Urine Drug Screen:     Component Value Date/Time   LABOPIA NONE DETECTED 01/30/2013 0738   COCAINSCRNUR NONE DETECTED 01/30/2013 0738   LABBENZ NONE DETECTED 01/30/2013 0738   AMPHETMU NONE DETECTED  01/30/2013 0738   THCU NONE DETECTED 01/30/2013 0738   LABBARB NONE DETECTED 01/30/2013 0738      CT of the brain   01/30/13 Intraparenchymal hemorrhage centered within the right basal ganglia, with intraventricular extension and 8 mm right to left  midline shift. 05/14/2014No increase in the parenchymal hemorrhage. Minimal increase in the blood in the posterior aspect of the  right lateral ventricle. No change in midline shift.  CXR - 5/13 ET tube tip is at the thoracic inlet. Recommend advancement  1.5-2.5 cm.  Lungs are clear  EKG : nonspecific T wave changes.    Physical Exam  General: The patient is alert and cooperative at the time of the examination.  Respiratory: Lung fields are clear.  Cardiovascular: There is a regular rate and rhythm, no murmurs are noted  Abdomen: Abdomen is soft, nontender, positive bowel sounds.  Skin: No significant peripheral edema is noted.   Neurologic Exam  Cranial nerves: Facial symmetry is present. Speech is normal, no aphasia or dysarthria is noted. Extraocular movements are full. Visual fields are full. Some ptosis is noted on the right.  Motor: The patient has good strength in all 4 extremities, with exception that there is slight weakness of the left arm, some drift on the left..  Coordination: The patient has good finger-nose-finger and heel-to-shin bilaterally, with the exception that he has some difficulty performing finger-nose-finger with the left arm.  Gait and station: The gait was not tested.  Reflexes: Deep tendon reflexes are symmetric.    ASSESSMENT Rick Williams is a 47 y.o. 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   LDL 73 HbA1c 5.4  Hospital day # 4  TREATMENT/PLAN  Restart home BP med: Clonidine 0.2mg  bid  PRN hydralazine 10-40mg  for SBP >180  Keep  normothermia and normoglycemia.       Continue Lovenox       Consult to inpatient rehab      Physical and occupational therapy and speech therapy are following. The patient is now taking oral food and fluids. Potassium is low, we will give supplementation. Patient should be an excellent rehabilitation candidate.    02/03/2013   Lesly Dukes

## 2013-02-04 ENCOUNTER — Inpatient Hospital Stay (HOSPITAL_COMMUNITY): Payer: MEDICAID

## 2013-02-04 LAB — BASIC METABOLIC PANEL
BUN: 13 mg/dL (ref 6–23)
CO2: 23 mEq/L (ref 19–32)
Calcium: 9 mg/dL (ref 8.4–10.5)
Chloride: 107 mEq/L (ref 96–112)
Creatinine, Ser: 0.89 mg/dL (ref 0.50–1.35)
GFR calc Af Amer: >90 mL/min (ref >90)
GFR calc non Af Amer: >90 mL/min (ref >90)
Glucose, Bld: 109 mg/dL — ABNORMAL HIGH (ref 70–99)
Potassium: 4 mEq/L (ref 3.5–5.1)
Sodium: 141 mEq/L (ref 135–145)

## 2013-02-04 LAB — VITAMIN B12: Vitamin B-12: 1286 pg/mL — ABNORMAL HIGH (ref 211–911)

## 2013-02-04 MED ORDER — LORAZEPAM 2 MG/ML IJ SOLN
0.5000 mg | Freq: Four times a day (QID) | INTRAMUSCULAR | Status: DC | PRN
  Administered 2013-02-04 – 2013-02-05 (×2): 0.5 mg via INTRAVENOUS
  Filled 2013-02-04 (×2): qty 1

## 2013-02-04 NOTE — Progress Notes (Signed)
Stroke Team Progress Note  HISTORY Rick Williams is an 47 y.o. male with a past medical history significant for hypertension, poorly adherent to treatment, ETOH use, stroke without apparent residual deficits, brought to [facility] ED by medics as a code stroke after being found on the floor with decreased responsiveness and left hemiplegia.  Rick Williams said that he hasn't been taking his BP medications for the last 3 months and at 2230 developed severe headache for which he took Benadryl to no avail. Then, his girlfriend reports finding him on the floor, less communicative, and not able to move the left side.  According to EMS his BP at the scene was 190/110 and he was completely hemiplegic in the left side. On route to the hospital he was responding only to sternal rub.  NIHSS 10. Emergent CT brain revealed a large right ICH centered around the BG, with intraventricular extension and 8 mm right to let midline shift.  After returning from CT he started to demonstrate declining mental status and the decision was made to proceed with early intubation. Still with SBP above 190 and nicardipine drip started.  Date last known well: 01/30/13  Time last known well: 2230   Patient was not a TPA candidate secondary to ICH. He was admitted to the neuro ICU for further evaluation and treatment.  SUBJECTIVE  He is now eating, but he is complaining of choking with swallowing liquids. The patient now reports that he is bringing up yellow phlegm.   OBJECTIVE Most recent Vital Signs: Filed Vitals:   02/03/13 2103 02/03/13 2144 02/04/13 0207 02/04/13 0601  BP: 164/70 138/82 134/87 142/83  Pulse:  65 61 53  Temp:  98.7 F (37.1 C) 97.5 F (36.4 C) 98.4 F (36.9 C)  TempSrc:  Oral Oral Oral  Resp:  20 20 20   Height:      Weight:      SpO2:  94% 94% 94%   CBG (last 3)  No results found for this basename: GLUCAP,  in the last 72 hours  IV Fluid Intake:   . sodium chloride 20 mL/hr at 02/02/13 0825     MEDICATIONS  . antiseptic oral rinse  15 mL Mouth Rinse QID  . chlorhexidine  15 mL Mouth Rinse BID  . cloNIDine  0.2 mg Oral BID  . enoxaparin (LOVENOX) injection  40 mg Subcutaneous Q24H  . multivitamin  1 tablet Oral Daily  . nicotine  14 mg Transdermal Daily  . pantoprazole  40 mg Oral Q1200  . potassium chloride  20 mEq Oral BID  . senna-docusate  1 tablet Oral BID  . thiamine  100 mg Oral Daily   PRN:  acetaminophen, acetaminophen, guaiFENesin, hydrALAZINE, HYDROmorphone (DILAUDID) injection, ondansetron (ZOFRAN) IV, RESOURCE THICKENUP CLEAR, traMADol  Diet:  Dysphagia NPO, swallow eval today Activity:  OOB, Pt/OT DVT Prophylaxis:  Lovenox   CLINICALLY SIGNIFICANT STUDIES Basic Metabolic Panel:   Recent Labs Lab 02/01/13 0303  02/02/13 0515 02/04/13 0500  NA 154*  < > 146* 141  K 3.5  --  3.0* 4.0  CL 123*  --  111 107  CO2 24  --  24 23  GLUCOSE 111*  --  112* 109*  BUN 19  --  9 13  CREATININE 1.05  --  0.89 0.89  CALCIUM 8.2*  --  9.0 9.0  MG 2.1  --  1.5  --   PHOS 2.4  --  2.9  --   < > = values  in this interval not displayed. Liver Function Tests:   Recent Labs Lab 01/30/13 0550  AST 53*  ALT 26  ALKPHOS 62  BILITOT 0.4  PROT 8.7*  ALBUMIN 3.5   CBC:   Recent Labs Lab 01/30/13 0550  01/31/13 0245 02/01/13 0500  WBC 8.6  --  11.2* 10.0  NEUTROABS 6.1  --   --   --   HGB 14.5  < > 12.9* 12.4*  HCT 40.1  < > 37.6* 38.0*  MCV 98.8  --  101.6* 105.6*  PLT 199  --  168 170  < > = values in this interval not displayed. Coagulation:   Recent Labs Lab 01/30/13 0550  LABPROT 13.5  INR 1.04   Cardiac Enzymes:   Recent Labs Lab 01/30/13 0550  TROPONINI <0.30   Urinalysis:   Recent Labs Lab 01/30/13 0721  COLORURINE YELLOW  LABSPEC 1.016  PHURINE 7.0  GLUCOSEU NEGATIVE  HGBUR MODERATE*  BILIRUBINUR NEGATIVE  KETONESUR NEGATIVE  PROTEINUR >300*  UROBILINOGEN 0.2  NITRITE NEGATIVE  LEUKOCYTESUR NEGATIVE    Urine Drug  Screen:     Component Value Date/Time   LABOPIA NONE DETECTED 01/30/2013 0738   COCAINSCRNUR NONE DETECTED 01/30/2013 0738   LABBENZ NONE DETECTED 01/30/2013 0738   AMPHETMU NONE DETECTED 01/30/2013 0738   THCU NONE DETECTED 01/30/2013 0738   LABBARB NONE DETECTED 01/30/2013 0738      CT of the brain   01/30/13 Intraparenchymal hemorrhage centered within the right basal ganglia, with intraventricular extension and 8 mm right to left  midline shift. 05/14/2014No increase in the parenchymal hemorrhage. Minimal increase in the blood in the posterior aspect of the  right lateral ventricle. No change in midline shift.  CXR - 5/13 ET tube tip is at the thoracic inlet. Recommend advancement  1.5-2.5 cm.  Lungs are clear  EKG : nonspecific T wave changes.    Physical Exam  General: The patient is alert and cooperative at the time of the examination.  Respiratory: Lung fields are notable for bilateral rhonchi.  Cardiovascular: There is a regular rate and rhythm, no murmurs are noted  Abdomen: Abdomen is soft, nontender, positive bowel sounds.  Skin: No significant peripheral edema is noted.   Neurologic Exam  Cranial nerves: Facial symmetry is present. Speech is normal, no aphasia or dysarthria is noted. Extraocular movements are full. Visual fields are full. Some ptosis is noted on the right.  Motor: The patient has good strength in all 4 extremities, with exception that there is slight weakness of the left arm, some drift on the left..  Coordination: The patient has good finger-nose-finger and heel-to-shin bilaterally, with the exception that he has some difficulty performing finger-nose-finger with the left arm.  Gait and station: The gait was not tested.  Reflexes: Deep tendon reflexes are symmetric.    ASSESSMENT [name] is a 47 y.o. 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   LDL 73 HbA1c 5.4  Hospital day # 5  The patient reports that he is having problems with coughing after drinking liquids. The patient is on honey thick liquids. Lung fields today now have bilateral rhonchi. This is new from yesterday.  TREATMENT/PLAN  Restart home BP med: Clonidine 0.2mg  bid  PRN hydralazine 10-40mg  for SBP >180  Keep normothermia and normoglycemia.  CXR today the patient may be aspirating  fluids       Continue Lovenox       Consult to inpatient rehab      Physical and occupational therapy and speech therapy are following. The patient is now taking oral food and fluids. Potassium is low, we will give supplementation. Patient should be an excellent rehabilitation candidate.    02/04/2013   Lesly Dukes

## 2013-02-04 NOTE — Progress Notes (Signed)
Patient NPO due to possible aspiration.  Speech evaluation tomorrow.  Dr. Amada Jupiter called who increased fluids to 50ml per hour.  He instructed to hold all po meds and use IV hydralazine if bp is too high.  Patient and family understand NPO status and progression of care.  Lance Bosch, RN

## 2013-02-05 LAB — GLUCOSE, CAPILLARY: Glucose-Capillary: 113 mg/dL — ABNORMAL HIGH (ref 70–99)

## 2013-02-05 MED ORDER — HYDROMORPHONE HCL PF 1 MG/ML IJ SOLN
2.0000 mg | Freq: Once | INTRAMUSCULAR | Status: AC
  Administered 2013-02-05: 2 mg via INTRAVENOUS
  Filled 2013-02-05: qty 2

## 2013-02-05 NOTE — Progress Notes (Signed)
Stroke Team Progress Note  HISTORY Rick Williams is an 47 y.o. male with a past medical history significant for hypertension, poorly adherent to treatment, ETOH use, stroke without apparent residual deficits, brought to [facility] ED by medics as a code stroke after being found on the floor with decreased responsiveness and left hemiplegia.  Rick Williams said that he hasn't been taking his BP medications for the last 3 months and at 2230 developed severe headache for which he took Benadryl to no avail. Then, his girlfriend reports finding him on the floor, less communicative, and not able to move the left side.  According to EMS his BP at the scene was 190/110 and he was completely hemiplegic in the left side. On route to the hospital he was responding only to sternal rub.  NIHSS 10. Emergent CT brain revealed a large right ICH centered around the BG, with intraventricular extension and 8 mm right to let midline shift.  After returning from CT he started to demonstrate declining mental status and the decision was made to proceed with early intubation. Still with SBP above 190 and nicardipine drip started.  Date last known well: 01/30/13  Time last known well: 2230   Patient was not a TPA candidate secondary to ICH. He was admitted to the neuro ICU for further evaluation and treatment.  SUBJECTIVE  Rick Williams was seen and examined at bedside.  He was noted to have more confusion, agitation, and hallucinations overnight.  This morning he was very sleepy and difficult to arouse. He did wake up to answer some questions.  He remains NPO due to lethargy.    OBJECTIVE Most recent Vital Signs: Filed Vitals:   02/04/13 2352 02/05/13 0210 02/05/13 0616 02/05/13 0940  BP: 144/89 132/103 156/84 174/107  Pulse: 61 61 52 61  Temp:  98.1 F (36.7 C) 97.5 F (36.4 C) 97.6 F (36.4 C)  TempSrc:  Oral Axillary Oral  Resp:  20 20 18   Height:      Weight:      SpO2:  100% 98% 100%   CBG (last 3)   Recent  Labs  02/05/13 1110  GLUCAP 113*    IV Fluid Intake:   . sodium chloride 50 mL/hr at 02/04/13 2120    MEDICATIONS  . antiseptic oral rinse  15 mL Mouth Rinse QID  . chlorhexidine  15 mL Mouth Rinse BID  . cloNIDine  0.2 mg Oral BID  . enoxaparin (LOVENOX) injection  40 mg Subcutaneous Q24H  . multivitamin  1 tablet Oral Daily  . nicotine  14 mg Transdermal Daily  . pantoprazole  40 mg Oral Q1200  . potassium chloride  20 mEq Oral BID  . senna-docusate  1 tablet Oral BID  . thiamine  100 mg Oral Daily   PRN:  acetaminophen, acetaminophen, guaiFENesin, hydrALAZINE, HYDROmorphone (DILAUDID) injection, LORazepam, ondansetron (ZOFRAN) IV, RESOURCE THICKENUP CLEAR, traMADol  Diet:  NPO NPO, swallow eval pending Activity:  OOB, Pt/OT DVT Prophylaxis:  Lovenox   CLINICALLY SIGNIFICANT STUDIES Basic Metabolic Panel:   Recent Labs Lab 02/01/13 0303  02/02/13 0515 02/04/13 0500  NA 154*  < > 146* 141  K 3.5  --  3.0* 4.0  CL 123*  --  111 107  CO2 24  --  24 23  GLUCOSE 111*  --  112* 109*  BUN 19  --  9 13  CREATININE 1.05  --  0.89 0.89  CALCIUM 8.2*  --  9.0 9.0  MG 2.1  --  1.5  --   PHOS 2.4  --  2.9  --   < > = values in this interval not displayed. Liver Function Tests:   Recent Labs Lab 01/30/13 0550  AST 53*  ALT 26  ALKPHOS 62  BILITOT 0.4  PROT 8.7*  ALBUMIN 3.5   CBC:   Recent Labs Lab 01/30/13 0550  01/31/13 0245 02/01/13 0500  WBC 8.6  --  11.2* 10.0  NEUTROABS 6.1  --   --   --   HGB 14.5  < > 12.9* 12.4*  HCT 40.1  < > 37.6* 38.0*  MCV 98.8  --  101.6* 105.6*  PLT 199  --  168 170  < > = values in this interval not displayed. Coagulation:   Recent Labs Lab 01/30/13 0550  LABPROT 13.5  INR 1.04   Cardiac Enzymes:   Recent Labs Lab 01/30/13 0550  TROPONINI <0.30   Urinalysis:   Recent Labs Lab 01/30/13 0721  COLORURINE YELLOW  LABSPEC 1.016  PHURINE 7.0  GLUCOSEU NEGATIVE  HGBUR MODERATE*  BILIRUBINUR NEGATIVE   KETONESUR NEGATIVE  PROTEINUR >300*  UROBILINOGEN 0.2  NITRITE NEGATIVE  LEUKOCYTESUR NEGATIVE    Urine Drug Screen:     Component Value Date/Time   LABOPIA NONE DETECTED 01/30/2013 0738   COCAINSCRNUR NONE DETECTED 01/30/2013 0738   LABBENZ NONE DETECTED 01/30/2013 0738   AMPHETMU NONE DETECTED 01/30/2013 0738   THCU NONE DETECTED 01/30/2013 0738   LABBARB NONE DETECTED 01/30/2013 0738    CT of the brain   01/30/13 Intraparenchymal hemorrhage centered within the right basal ganglia, with intraventricular extension and 8 mm right to left  midline shift. 05/14/2014No increase in the parenchymal hemorrhage. Minimal increase in the blood in the posterior aspect of the  right lateral ventricle. No change in midline shift.  CXR - 5/13 ET tube tip is at the thoracic inlet. Recommend advancement  1.5-2.5 cm.  Lungs are clear  CXR 5/18: bronchitic changes--prominent peribronchial thickening on right, clearing of lung bases.   EKG : nonspecific T wave changes.   Physical Exam  General: The patient is lethargic and sleepy during exam  Respiratory: Lung fields are notable for bilateral rhonchi.  Cardiovascular: There is a regular rate and rhythm, no murmurs are noted  Abdomen: Abdomen is soft, nontender, positive bowel sounds.  Skin: No significant peripheral edema is noted.  Neurologic Exam--difficult to assess due to lethargy Arouses and briefly follows commands.dysarthric. Cranial nerves: Facial symmetry is present. Mumbling speech.  Some ptosis is noted on the right.  Motor: decreased strength strength in all 4 extremities due to decreased patient effort, drift on the left arm.  Coordination: did not cooperate with coordination testing due to lethargy  Gait and station: The gait was not tested.  ASSESSMENT Rick Williams is a 47 y.o. 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   LDL 73 HbA1c 5.4  Hospital day # 6  TREATMENT/PLAN  Continue Clonidine 0.2mg  bid  PRN hydralazine 10-40mg  for SBP >180  Keep normothermia and normoglycemia.  Continue Lovenox  Consult to inpatient rehab--awaiting repeat swallow eval, planning for IP rehab admit tomorrow  Physical and occupational therapy and speech therapy are following.  NPO due to lethargy, SLP will continue to follow  Patient should be an excellent rehabilitation candidate.  Discussed with Dr. Pearlean Brownie   Signed: Lindalou Hose  Virgina Organ, MD PGY-I, Internal Medicine Resident Pager: [phone]  02/05/2013,3:27 PM  I have personally obtained a history, examined the patient, evaluated imaging results, and formulated the assessment and plan of care. I agree with the above. Delia Heady, MD

## 2013-02-05 NOTE — Progress Notes (Addendum)
I met with pt, spouse and daughter, Alcario Drought, at bedside. Plans to [facility] to [facility] where children and wife can provide 24/7 care. One level home with 2 step entry. Patient awake at present. Await repeat swallow eval and likely plan ip rehab admit tomorrow. Wife applied for disability today [place]. Plans for Obamacare to begin 02/18/13 after pt pays premium.[phone]

## 2013-02-05 NOTE — Plan of Care (Signed)
Speech therapist Tacey Ruiz paged to come and repeat the swallow test.

## 2013-02-05 NOTE — Progress Notes (Signed)
On assessment patient was found not in restraints sitting calmly in the bed with his wife present. Both pt and wife expressed concerns that reportedly the previous tech had took the pt vitals around 6pm and they were elevated. A nurse was to follow up but no one did. No 6 pm vitals documented. Pt vitals was taken and found to be high. Refer to documentation for values. Pt also complained of severe headache 10/10. I preceded to give scheduled clonidine and prn hydralazine, tylenol and tramadol. I later noticed that I gave the tramadol sooner than I should have. Never the less upon recheck, the Pts BP was still elevated and patient had no relief of his severe headache and was found diaphoretic. On-call neuro paged. Dr. Roseanne Reno gave orders for one-time dose of dilaudid. No orders to address BP at this time. Environmental changes was made. Pt was sleeping on recheck. Will continue to monitor per shift.

## 2013-02-05 NOTE — Progress Notes (Signed)
OT Cancellation Note  Patient Details Name: MAKALE PINDELL MRN: [id number] DOB: 04-08-1966   Cancelled Treatment:    Reason Eval/Treat Not Completed: Fatigue/lethargy limiting ability to participate.  RN reports pt sleeping soundly.  Will re-attempt  Toni Hoffmeister, Ursula Alert M 02/05/2013, 1:42 PM

## 2013-02-05 NOTE — Progress Notes (Signed)
Physical Therapy Treatment Patient Details Name: Rick Williams MRN: [id number] DOB: 01-21-1966 Today's Date: 02/05/2013 Time: 8295-6213 PT Time Calculation (min): 23 min  PT Assessment / Plan / Recommendation Comments on Treatment Session  Making slow progress with mobility.  Continue to note ataxic movement of LUE/LLE, with decreased coordination/balance with gait.  Continues to be excellent CIR candidate.    Follow Up Recommendations  CIR     Does the patient have the potential to tolerate intense rehabilitation     Barriers to Discharge        Equipment Recommendations  Other (comment) (TBD)    Recommendations for Other Services Rehab consult  Frequency Min 4X/week   Plan Discharge plan remains appropriate;Frequency remains appropriate    Precautions / Restrictions Precautions Precautions: Fall Precaution Comments: decreased left side awareness Restrictions Weight Bearing Restrictions: No   Pertinent Vitals/Pain     Mobility  Bed Mobility Bed Mobility: Supine to Sit;Sitting - Scoot to Edge of Bed Supine to Sit: 4: Min guard;HOB flat Sitting - Scoot to [place] of Bed: 4: Min guard Details for Bed Mobility Assistance: Verbal and tactile cues for technique.  Tactile cues to bring LUE forward when moving to sitting. Transfers Transfers: Sit to Stand;Stand to Sit Sit to Stand: 3: Mod assist;With upper extremity assist;From bed Stand to Sit: 3: Mod assist;With upper extremity assist;With armrests;To chair/3-in-1 Details for Transfer Assistance: Verbal cues for hand placement.  Assist for LUE.  Assist for balance with transitions. Ambulation/Gait Ambulation/Gait Assistance: 3: Mod assist (+1 for lines) Ambulation Distance (Feet): 78 Feet Assistive device: Rolling walker Ambulation/Gait Assistance Details: Assist for Lt hand on RW.  Verbal cues for sequencing, and to take longer step with LLE.  Continues to have decreased lateral weight shift and decreased control of LLE  in stance and swing. Gait Pattern: Step-through pattern;Decreased stance time - left;Decreased step length - left;Decreased step length - right;Decreased weight shift to right;Ataxic;Trunk flexed;Narrow base of support Gait velocity: Slow gait speed General Gait Details: Ataxic movement with decreased awareness of Lt side during gait. Modified Rankin (Stroke Patients Only) Pre-Morbid Rankin Score: No symptoms Modified Rankin: Moderately severe disability      PT Goals Acute Rehab PT Goals Pt will go Supine/Side to Sit: with supervision PT Goal: Supine/Side to Sit - Progress: Progressing toward goal Pt will go Sit to Stand: with supervision PT Goal: Sit to Stand - Progress: Progressing toward goal Pt will Transfer Bed to Chair/Chair to Bed: with supervision PT Transfer Goal: Bed to Chair/Chair to Bed - Progress: Progressing toward goal Pt will Stand: with supervision;with unilateral upper extremity support;3 - 5 min PT Goal: Stand - Progress: Progressing toward goal Pt will Ambulate: >150 feet;with least restrictive assistive device;with supervision PT Goal: Ambulate - Progress: Progressing toward goal  Visit Information  Last PT Received On: 02/05/13 Assistance Needed: +2    Subjective Data  Subjective: "OK"  Minimal speech.  Groggy   Cognition  Cognition Arousal/Alertness: Lethargic Behavior During Therapy: Impulsive Overall Cognitive Status: Impaired/Different from baseline Area of Impairment: Orientation;Attention;Memory;Safety/judgement;Problem solving Orientation Level: Disoriented to;Time;Situation Current Attention Level: Sustained Memory: Decreased short-term memory Safety/Judgement: Decreased awareness of safety;Decreased awareness of deficits Problem Solving: Difficulty sequencing;Requires verbal cues;Requires tactile cues    Balance  Static Standing Balance Static Standing - Balance Support: Bilateral upper extremity supported Static Standing - Level of  Assistance: 3: Mod assist Static Standing - Comment/# of Minutes: Manual and verbal cueing to obtain midline orientation while UEs supported on RW. Cues through left  hip and bil shoulders in order to minimize left lateral lean.  End of Session PT - End of Session Equipment Utilized During Treatment: Gait belt Activity Tolerance: Patient limited by fatigue Patient left: in chair;with call bell/phone within reach;with family/visitor present;with restraints reapplied Nurse Communication: Mobility status ( In chair with restraints in place)   GP     Vena Austria 02/05/2013, 3:29 PM Durenda Hurt. Renaldo Fiddler, [facility] Acute Rehab Services Pager [phone]

## 2013-02-05 NOTE — Plan of Care (Signed)
Dr Lance Muss is aware that pt refused his am labs. No further orders received.

## 2013-02-05 NOTE — PMR Pre-admission (Signed)
PMR Admission Coordinator Pre-Admission Assessment  Patient: Rick Williams is an 47 y.o., male MRN: [id number] DOB: 1966-07-31 Height: 5\' 10"  (177.8 cm) Weight: 64.9 kg (143 lb 1.3 oz)              Insurance Information HMO:     PPO:      PCP:      IPA:      80/20:      OTHER:  PRIMARY:self Wife states he signed up for [facility] but did not pay the $8 premium. She has spoken with someone since pt admitted and once she pays the $8 on 5/20, his insurance to begin 02/18/2013  Medicaid Application Date:       Case Manager:  Disability Application Date: wife reports disability began 5/19 through office in [place]     Case Worker:   Emergency Contact Information Contact Information   Name Relation Home Work Mobile   [place] Spouse [phone]     Rick Williams, Rick Williams Father [phone]     Rick Williams, Rick Williams Daughter   [phone]     Current Medical History  Patient Admitting Diagnosis: right BG Hemorrhage  History of Present Illness: Rick Williams is a 47 y.o. 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 Patient independent prior to admission working full-time. Admitted 01/30/2013 of left-sided weakness and altered mental status. Reports the patient had not been taking his blood pressure medications for 3 months. He was found on the floor by his wife. Blood pressure 190/110. 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 Patient was placed on the nicardipine drip for blood pressure control. Neurology services followup with conservative care. Followup cranial CT scan 01/31/2013 without change. Subcutaneous Lovenox initiated 02/01/2013 for DVT prophylaxis. NicoDerm patch added for history of tobacco abuse. Patient was maintained on ventilator support for a short time followed by critical care medicine.  Physical therapy evaluation completed 02/01/2013 as well as occupational therapy with noted decrease in balance and being impulsive.  Received Ativan on 5/18 pm which made pt restless, agitated and hallucinating. Complains of headache and given Dilaudid IV on 5/19 which made him lethargic during the day with difficulty with SLP swallow evaluation follow up. SLP did see pt late 5/20 and pt now on Dysphagia 3 diet with honey thick liquids.Awake and alert today 02/06/2013.  . Total: 4 NIH  Past Medical History  Past Medical History  Diagnosis Date  . Hypertension   . Pneumonia   . Hemothorax on left   . Stroke 2007    no deficits    Family History  family history is not on file.  Prior Rehab/Hospitalizations: none   Current Medications  Current facility-administered medications:0.9 %  sodium chloride infusion, , Intravenous, Continuous, Ritta Slot, MD, Last Rate: 50 mL/hr at 02/05/13 1402;  acetaminophen (TYLENOL) suppository 650 mg, 650 mg, Rectal, Q4H PRN, Merwyn Katos, MD;  acetaminophen (TYLENOL) tablet 650 mg, 650 mg, Per Tube, Q4H PRN, Merwyn Katos, MD, 650 mg at 02/05/13 1958 cloNIDine (CATAPRES) tablet 0.2 mg, 0.2 mg, Oral, BID, Layne Benton, NP, 0.2 mg at 02/06/13 0937;  enoxaparin (LOVENOX) injection 40 mg, 40 mg, Subcutaneous, Q24H, Layne Benton, NP, 40 mg at 02/05/13 1211;  guaiFENesin (ROBITUSSIN) 100 MG/5ML solution 100 mg, 5 mL, Oral, Q4H PRN, Noel Christmas, 100 mg at 02/05/13 2241;  hydrALAZINE (APRESOLINE) injection 10-40  mg, 10-40 mg, Intravenous, Q4H PRN, Merwyn Katos, MD, 20 mg at 02/05/13 2007 multivitamin (PROSIGHT) tablet 1 tablet, 1 tablet, Oral, Daily, York Spaniel, MD, 1 tablet at 02/06/13 [phone];  nicotine (NICODERM CQ - dosed in mg/24 hours) patch 14 mg, 14 mg, Transdermal, Daily, Layne Benton, NP, 14 mg at 02/06/13 0940;  ondansetron (ZOFRAN) injection 4 mg, 4 mg, Intravenous, Q6H PRN, Wyatt Portela, MD;  pantoprazole (PROTONIX) EC tablet 40  mg, 40 mg, Oral, Q1200, York Spaniel, MD, 40 mg at 02/04/13 1215 potassium chloride SA (K-DUR,KLOR-CON) CR tablet 20 mEq, 20 mEq, Oral, BID, York Spaniel, MD, 20 mEq at 02/06/13 1191;  RESOURCE THICKENUP CLEAR, , Oral, PRN, Micki Riley, MD;  senna-docusate (Senokot-S) tablet 1 tablet, 1 tablet, Oral, BID, Wyatt Portela, MD, 1 tablet at 02/06/13 [phone];  thiamine (VITAMIN B-1) tablet 100 mg, 100 mg, Oral, Daily, York Spaniel, MD, 100 mg at 02/06/13 9562 traMADol (ULTRAM) tablet 100 mg, 100 mg, Oral, Q6H PRN, Layne Benton, NP, 100 mg at 02/05/13 1957  Patients Current Diet: Dysphagia 3 diet with honey thick liquids  Precautions / Restrictions Precautions Precautions: Fall Precaution Comments: decreased left side awareness Restrictions Weight Bearing Restrictions: No   Prior Activity Level Community (5-7x/wk): employed   Journalist, 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 Assistive Devices/Equipment: None Home Adaptive Equipment: None  Prior Functional Level Prior Function Level of Independence: Independent Able to Take Stairs?: Yes Driving: No Vocation: Full time employment  Current Functional Level Cognition  Arousal/Alertness: Lethargic Overall Cognitive Status: Impaired/Different from baseline Current Attention Level: Sustained Memory: Decreased short-term memory Orientation Level: Oriented X4 Safety/Judgement: Decreased awareness of safety;Decreased awareness of deficits General Comments: Focused on getting [facility]; difficulty managing left UE and when putting on sock needed assist to get left UE in proper position due to lack of awareness    Extremity Assessment (includes Sensation/Coordination)  RUE ROM/Strength/Tone: Within functional levels  RLE ROM/Strength/Tone: Within functional levels    ADLs  Eating/Feeding: NPO Upper Body Bathing: Simulated;Moderate assistance Where Assessed - Upper Body Bathing: Unsupported sitting Lower Body Bathing:  Simulated;Moderate assistance Where Assessed - Lower Body Bathing: Supported sit to stand Upper Body Dressing: Performed;Moderate assistance Where Assessed - Upper Body Dressing: Unsupported sitting Lower Body Dressing: Simulated;Moderate assistance Where Assessed - Lower Body Dressing: Supported sit to Pharmacist, hospital: Simulated;Moderate assistance Toilet Transfer Method:  (ambulating) Toilet Transfer Equipment:  (bed to ambulate in hall then return to bed) Equipment Used: Gait belt;Rolling walker Transfers/Ambulation Related to ADLs: Mod assist with RW. Manual cues through left hip and bil shoulders for midline positioning and for advancing LLE.  Pt with heavy left lateral lean. Also assist to attend to LUE on RW ( L UE occasionally falling to side while trying to push RW). ADL Comments: Pt requiring assist to attend to left side, especially L UE positioning.     Mobility  Bed Mobility: Supine to Sit;Sitting - Scoot to Edge of Bed Supine to Sit: 4: Min guard;HOB flat Sitting - Scoot to [place] of Bed: 4: Min guard Sit to Supine: 4: Min assist    Transfers  Transfers: Sit to Stand;Stand to Sit Sit to Stand: 3: Mod assist;With upper extremity assist;From bed Stand to Sit: 3: Mod assist;With upper extremity assist;With armrests;To chair/3-in-1    Ambulation / Gait / Stairs / Wheelchair Mobility  Ambulation/Gait Ambulation/Gait Assistance: 3: Mod assist (+1 for lines) Ambulation Distance (Feet): 78 Feet Assistive device: Rolling walker Ambulation/Gait Assistance Details: Assist for  Lt hand on RW.  Verbal cues for sequencing, and to take longer step with LLE.  Continues to have decreased lateral weight shift and decreased control of LLE in stance and swing. Gait Pattern: Step-through pattern;Decreased stance time - left;Decreased step length - left;Decreased step length - right;Decreased weight shift to right;Ataxic;Trunk flexed;Narrow base of support Gait velocity: Slow gait  speed General Gait Details: Ataxic movement with decreased awareness of Lt side during gait.    Posture / Balance Static Sitting Balance Static Sitting - Balance Support: No upper extremity supported;Feet supported Static Sitting - Level of Assistance: 5: Stand by assistance Static Sitting - Comment/# of Minutes: close supervision for safety due to pt very impulsive Dynamic Sitting Balance Dynamic Sitting - Balance Support: During functional activity;Feet supported;No upper extremity supported Dynamic Sitting - Level of Assistance: 4: Min assist Dynamic Sitting - Balance Activities: Forward lean/weight shifting;Reaching across midline Dynamic Sitting - Comments: leaning to left in chair trying to put on socks Static Standing Balance Static Standing - Balance Support: Bilateral upper extremity supported Static Standing - Level of Assistance: 3: Mod assist Static Standing - Comment/# of Minutes: Manual and verbal cueing to obtain midline orientation while UEs supported on RW. Cues through left hip and bil shoulders in order to minimize left lateral lean.    Special needs/care consideration Bowel mgmt:continent Bladder mgmt: continent Very impulsive. Fell in unit 3100.   Previous Home Environment Living Arrangements: Spouse/significant other Lives With: Spouse Available Help at Discharge: Family;Available 24 hours/day Type of Home: House Home Layout: One level Home Access: Stairs to enter Entrance Stairs-Rails: None Entrance Stairs-Number of Steps: 2 to 3 steps Bathroom Shower/Tub: Counselling psychologist: Yes How Accessible: Accessible via walker Home Care Services: No Additional Comments: wife requesting to be his paid caregiver once he gets approval for medicaid for she is currently a sitter  Discharge Living Setting Plans for Discharge Living Setting: Lives with (comment) (plans for wife and pt to move in with his daughter,  Rick Williams) Type of Home at Discharge: House Discharge Home Layout: One level Discharge Home Access: Stairs to enter Entrance Stairs-Number of Steps: 2 to 3 steps Discharge Bathroom Shower/Tub: Tub/shower unit Discharge Bathroom Toilet: Standard Discharge Bathroom Accessibility: Yes How Accessible: Accessible via walker Do you have any problems obtaining your medications?: Yes (Describe) (no insurance and has not taken meds in 3 months due to how t)  Social/Family/Support Systems Patient Roles: Spouse;Parent;Other (Comment) (employee) Contact Information: wife and daughter, Rick Williams Anticipated Caregiver: wife and his and her children Anticipated Caregiver's Contact Information: see above. wife lost her cell 02/05/13 Ability/Limitations of Caregiver: wife works as a Comptroller, but between all of family they feel they can provide 24/7 supervision. I spoke with wife and his daughter, Rick Williams Caregiver Availability: 24/7 Discharge Plan Discussed with Primary Caregiver: Yes Is Caregiver In Agreement with Plan?: Yes Does Caregiver/Family have Issues with Lodging/Transportation while Pt is in Rehab?: No (wife requesting that she stay with pt in his room )  Patient to d/c to daughter, Rick Williams's home. [address], [place], [place]. Her number is [phone].  Patient drinks a couple 40 oz beers per day and smokes  Goals/Additional Needs Patient/Family Goal for Rehab: Mod I to supervision with PT, OT, and SLP Expected length of stay: ELOS 9 to 12 days Dietary Needs: SLP to reeval on 5/20. Current Dysphagia 3 diet with honey thick liquids Pt/Family Agrees to Admission and willing to participate: Yes Program Orientation Provided & Reviewed with Pt/Caregiver  Including Roles  & Responsibilities: Yes   Decrease burden of Care through IP rehab admission: n/a  Possible need for SNF placement upon discharge:not anticipated  Patient Condition: This patient's medical and functional status has changed since  the consult dated: 02/03/2013 in which the Rehabilitation Physician determined and documented that the patient's condition is appropriate for intensive rehabilitative care in an inpatient rehabilitation facility. See "History of Present Illness" (above) for medical update. Functional changes are: patient is overall mod assist with left inattention and decreased safety awareness. Patient's medical and functional status update has been discussed with the Rehabilitation physician and patient remains appropriate for inpatient rehabilitation. Will admit to inpatient rehab today.  Preadmission Screen Completed By:  Clois Dupes, 02/06/2013 12:05 PM ______________________________________________________________________   Discussed status with Dr. Riley Kill  On 02/06/2013 at  1203 and received telephone approval for admission today.  Admission Coordinator:  Clois Dupes, time 1610 Date 02/06/2013.

## 2013-02-05 NOTE — Plan of Care (Signed)
md on call called in regards to pt wanting cough meds. Waiting for md to call back.

## 2013-02-05 NOTE — Progress Notes (Signed)
Patient sleeping, just received Dilaudid for pain per RN. Caregivers, family not at bedside. Wife and daughter have just left . I have left a message for wife to contact me to discuss rehab venue options. [phone]

## 2013-02-05 NOTE — Progress Notes (Signed)
Speech Language Pathology Dysphagia Treatment Patient Details Name: Rick Williams MRN: [id number] DOB: [birth year] Today's Date: 02/05/2013 Time: 4098-1191 SLP Time Calculation (min): 20 min  Assessment / Plan / Recommendation Clinical Impression  Treatment focused on diagnostic po trials. Noted events of this w/e with question of aspiration episode. Per patient report, he was provided with thin liquids with noted coughing post swallow. Wife reports that during consumption of mechanical soft diet, honey thick liquids, no overt difficulty has been noted. CXR with right sided Bronchitic changes. Clearing of the lung bases. No definite aspiration documented. Patient initially alert upon entrance to room, agreeable to po trials however quickly became very lethargic, declining further pos. Subtle wet vocal quality noted with honey thick liquids, self fed with min verbal cueing for small controlled sip size. No further pos provided given known significant dysphagia combined with lethargy which will significantly increase aspiration risk. SLP will f/u in am 5/20 for continued diagnostic treatment.     Diet Recommendation  Continue with Current Diet: NPO    SLP Plan Goals updated   Pertinent Vitals/Pain None reported   Swallowing Goals  SLP Swallowing Goals Patient will utilize recommended strategies during swallow to increase swallowing safety with: Modified independent assistance Swallow Study Goal #2 - Progress: Discontinued (comment) (made NPO due to decline. ) Goal #3: Patient will demonstrate readiness for pos as evidenced by no overt s/s of aspiration with clinician provided po trials with moderate cueing.  Swallow Study Goal #3 - Progress:  (new goal)  General Temperature Spikes Noted: No Respiratory Status: Room air Behavior/Cognition: Lethargic Oral Cavity - Dentition: Adequate natural dentition Patient Positioning: Upright in chair      Dysphagia Treatment Treatment focused  on: Upgraded PO texture trials;Utilization of compensatory strategies Family/Caregiver Educated: wife and daughter Treatment Methods/Modalities: Skilled observation;Differential diagnosis Patient observed directly with PO's: Yes Type of PO's observed: Honey-thick liquids Feeding: Able to feed self Liquids provided via: Cup Oral Phase Signs & Symptoms: Prolonged oral phase Pharyngeal Phase Signs & Symptoms: Wet vocal quality (subtle) Type of cueing: Verbal Amount of cueing: Minimal   GO   Rick Lango MA, CCC-SLP [phone]   Rick Williams 02/05/2013, 2:16 PM

## 2013-02-05 NOTE — Progress Notes (Signed)
Speech Language Pathology Dysphagia Treatment Patient Details Name: KASEM MOZER MRN: [id number] DOB: 1966-07-18 Today's Date: 02/05/2013 Time: 4098-1191 SLP Time Calculation (min): 20 min  Assessment / Plan / Recommendation Clinical Impression  Second treatment provided today as RN paged this SLP stating patient more alert. Able to self feed diagnostic po trials wtihout overt indication of aspiration with SLP providing min verbal cueing for small, controlled bite/sip sizes, and full clearance of oral cavity prior to initiation of next bite/sip. Overall, vocal quality remained clear. No overt indication of aspiration observed with overall function appearing consistent with results of MBS. Recommend resuming previously recommended diet of dysphagia 3 with honey thick liquids with strict aspiration precautions which patient, family, and RN were educated on again today. SLP will f/u at bedside 5/20. Continue to recommend cognitive evaluation as well.     Diet Recommendation  Continue with Current Diet: NPO Initiate / Change Diet: Dysphagia 3 (mechanical soft);Honey-thick liquid    SLP Plan Continue with current plan of care   Pertinent Vitals/Pain None reported   Swallowing Goals  SLP Swallowing Goals Patient will utilize recommended strategies during swallow to increase swallowing safety with: Modified independent assistance Swallow Study Goal #2 - Progress: Progressing toward goal Goal #3: Patient will demonstrate readiness for pos as evidenced by no overt s/s of aspiration with clinician provided po trials with moderate cueing.  Swallow Study Goal #3 - Progress: Met  General Temperature Spikes Noted: No Respiratory Status: Room air Behavior/Cognition: Lethargic Oral Cavity - Dentition: Adequate natural dentition Patient Positioning: Upright in chair     Dysphagia Treatment Treatment focused on: Upgraded PO texture trials;Utilization of compensatory strategies Family/Caregiver  Educated: wife and daughter Treatment Methods/Modalities: Skilled observation;Differential diagnosis Patient observed directly with PO's: Yes Type of PO's observed: Dysphagia 3 (soft);Honey-thick liquids Feeding: Able to feed self Liquids provided via: Cup Oral Phase Signs & Symptoms: Prolonged oral phase Pharyngeal Phase Signs & Symptoms: Wet vocal quality (subtle) Type of cueing: Verbal Amount of cueing: Minimal   GO    [name] MA, CCC-SLP [phone]  Brookelynn Hamor Meryl 02/05/2013, 3:56 PM

## 2013-02-05 NOTE — Progress Notes (Signed)
NUTRITION FOLLOW UP  Intervention:   1. Supplement diet as appropriate.  Nutrition Dx:   Inadequate oral intake related to inability to eat as evidenced by NPO status; ongoing.   Goal:   Pt to meet >/= 90% of their estimated nutrition needs; not met.   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 Pt with hx of daily ETOH use. Pt on MVI.  Pt extubated 5/15. Spoke with wife who reports that pt had tolerated Dysphagia 3 diet with Honey thick liquids but over weekend was given thin liquids and did not tolerate. Pt made NPO, await SLP eval. Some level of malnutrition suspected on admission due to weight loss hx and poor diet hx, therefore pt would be at risk for refeeding syndrome with initiation of enteral nutrition support.  Plan is for rehab, possibly tomorrow.   Height: Ht Readings from Last 1 Encounters:  02/02/13 5\' 10"  (1.778 m)    Weight Status:   Wt Readings from Last 1 Encounters:  02/02/13 143 lb 1.3 oz (64.9 kg)  Admission weight 153 lb  Re-estimated needs:  Kcal: 2000-2200 Protein: 100-120 grams  Fluid: > 2 L/day  Skin: no issues noted  Diet Order: NPO   Intake/Output Summary (Last 24 hours) at 02/05/13 0917 Last data filed at 02/05/13 0831  Gross per 24 hour  Intake    240 ml  Output    825 ml  Net   -585 ml    Last BM: 5/18   Labs:   Recent Labs Lab 01/31/13 2145 02/01/13 0303  02/01/13 2215 02/02/13 0515 02/04/13 0500  NA 153* 154*  < > 150* 146* 141  K  --  3.5  --   --  3.0* 4.0  CL  --  123*  --   --  111 107  CO2  --  24  --   --  24 23  BUN  --  19  --   --  9 13  CREATININE  --  1.05  --   --  0.89 0.89  CALCIUM  --  8.2*  --   --  9.0 9.0  MG  --  2.1  --   --  1.5  --   PHOS  --  2.4  --   --  2.9  --   GLUCOSE  --  111*  --   --  112* 109*  < > = values in this interval not displayed.  CBG  (last 3)  No results found for this basename: GLUCAP,  in the last 72 hours  Scheduled Meds: . antiseptic oral rinse  15 mL Mouth Rinse QID  . chlorhexidine  15 mL Mouth Rinse BID  . cloNIDine  0.2 mg Oral BID  . enoxaparin (LOVENOX) injection  40 mg Subcutaneous Q24H  . multivitamin  1 tablet Oral Daily  . nicotine  14 mg Transdermal Daily  . pantoprazole  40 mg Oral Q1200  . potassium chloride  20 mEq Oral BID  . senna-docusate  1 tablet Oral BID  . thiamine  100 mg Oral Daily    Continuous Infusions: . sodium chloride 50 mL/hr at 02/04/13 2120    [facility] RD, LDN, CNSC [phone] Pager [phone] After Hours Pager

## 2013-02-05 NOTE — Progress Notes (Signed)
Patient more confused, agitated and hallucinating. Pt said that he's in [place], he's trying to talk to someone who is not present. Dr. Roseanne Reno aware with no new orders. Will continue to monitor patient.

## 2013-02-06 ENCOUNTER — Inpatient Hospital Stay (HOSPITAL_COMMUNITY)
Admission: RE | Admit: 2013-02-06 | Discharge: 2013-02-17 | DRG: 945 | Disposition: A | Discharge status: Home / Self Care | Payer: Self-pay | Source: Intra-hospital | Attending: Physical Medicine & Rehabilitation | Admitting: Physical Medicine & Rehabilitation

## 2013-02-06 DIAGNOSIS — I619 Nontraumatic intracerebral hemorrhage, unspecified: Secondary | ICD-10-CM

## 2013-02-06 DIAGNOSIS — R51 Headache: Secondary | ICD-10-CM | POA: Diagnosis present

## 2013-02-06 DIAGNOSIS — F17200 Nicotine dependence, unspecified, uncomplicated: Secondary | ICD-10-CM | POA: Diagnosis present

## 2013-02-06 DIAGNOSIS — Z79899 Other long term (current) drug therapy: Secondary | ICD-10-CM

## 2013-02-06 DIAGNOSIS — I10 Essential (primary) hypertension: Secondary | ICD-10-CM | POA: Diagnosis present

## 2013-02-06 DIAGNOSIS — R1310 Dysphagia, unspecified: Secondary | ICD-10-CM | POA: Diagnosis present

## 2013-02-06 DIAGNOSIS — Z5189 Encounter for other specified aftercare: Principal | ICD-10-CM

## 2013-02-06 DIAGNOSIS — Z8673 Personal history of transient ischemic attack (TIA), and cerebral infarction without residual deficits: Secondary | ICD-10-CM

## 2013-02-06 LAB — CBC
HCT: 42.8 % (ref 39.0–52.0)
Hemoglobin: 15.4 g/dL (ref 13.0–17.0)
MCH: 35.9 pg — ABNORMAL HIGH (ref 26.0–34.0)
MCHC: 36 g/dL (ref 30.0–36.0)
MCV: 99.8 fL (ref 78.0–100.0)
Platelets: 202 10*3/uL (ref 150–400)
RBC: 4.29 MIL/uL (ref 4.22–5.81)
RDW: 12.7 % (ref 11.5–15.5)
WBC: 8.3 10*3/uL (ref 4.0–10.5)

## 2013-02-06 LAB — CREATININE, SERUM
Creatinine, Ser: 0.85 mg/dL (ref 0.50–1.35)
GFR calc Af Amer: >90 mL/min (ref >90)
GFR calc non Af Amer: >90 mL/min (ref >90)

## 2013-02-06 MED ORDER — ENOXAPARIN SODIUM 40 MG/0.4ML ~~LOC~~ SOLN
40.0000 mg | SUBCUTANEOUS | Status: DC
  Administered 2013-02-07 – 2013-02-16 (×10): 40 mg via SUBCUTANEOUS
  Filled 2013-02-06 (×11): qty 0.4

## 2013-02-06 MED ORDER — ONDANSETRON HCL 4 MG/2ML IJ SOLN: 4.0000 mg | Freq: Four times a day (QID) | INTRAMUSCULAR | Status: DC | PRN

## 2013-02-06 MED ORDER — CLONIDINE HCL 0.2 MG PO TABS
0.2000 mg | ORAL_TABLET | Freq: Two times a day (BID) | ORAL | Status: DC
  Administered 2013-02-06 – 2013-02-12 (×12): 0.2 mg via ORAL
  Filled 2013-02-06 (×14): qty 1

## 2013-02-06 MED ORDER — SODIUM CHLORIDE 0.9 % IV SOLN
INTRAVENOUS | Status: DC
  Administered 2013-02-06 – 2013-02-07 (×2): via INTRAVENOUS

## 2013-02-06 MED ORDER — GUAIFENESIN 100 MG/5ML PO SOLN
5.0000 mL | ORAL | Status: DC | PRN
  Administered 2013-02-07 – 2013-02-11 (×3): 100 mg via ORAL
  Filled 2013-02-06 (×2): qty 5

## 2013-02-06 MED ORDER — ENOXAPARIN SODIUM 40 MG/0.4ML ~~LOC~~ SOLN: 40.0000 mg | SUBCUTANEOUS | Status: DC

## 2013-02-06 MED ORDER — TRAMADOL HCL 50 MG PO TABS
100.0000 mg | ORAL_TABLET | Freq: Four times a day (QID) | ORAL | Status: DC | PRN
  Administered 2013-02-06 – 2013-02-07 (×4): 100 mg via ORAL
  Filled 2013-02-06 (×4): qty 2

## 2013-02-06 MED ORDER — ONDANSETRON HCL 4 MG PO TABS: 4.0000 mg | ORAL_TABLET | Freq: Four times a day (QID) | ORAL | Status: DC | PRN

## 2013-02-06 MED ORDER — PANTOPRAZOLE SODIUM 40 MG PO TBEC
40.0000 mg | DELAYED_RELEASE_TABLET | Freq: Every day | ORAL | Status: DC
  Administered 2013-02-07 – 2013-02-17 (×11): 40 mg via ORAL
  Filled 2013-02-06 (×13): qty 1

## 2013-02-06 MED ORDER — PNEUMOCOCCAL VAC POLYVALENT 25 MCG/0.5ML IJ INJ
0.5000 mL | INJECTION | INTRAMUSCULAR | Status: AC
  Administered 2013-02-07: 0.5 mL via INTRAMUSCULAR
  Filled 2013-02-06: qty 0.5

## 2013-02-06 MED ORDER — PROSIGHT PO TABS
1.0000 | ORAL_TABLET | Freq: Every day | ORAL | Status: DC
  Administered 2013-02-07 – 2013-02-17 (×11): 1 via ORAL
  Filled 2013-02-06 (×12): qty 1

## 2013-02-06 MED ORDER — NICOTINE 14 MG/24HR TD PT24
14.0000 mg | MEDICATED_PATCH | Freq: Every day | TRANSDERMAL | Status: DC
  Administered 2013-02-07 – 2013-02-17 (×11): 14 mg via TRANSDERMAL
  Filled 2013-02-06 (×12): qty 1

## 2013-02-06 MED ORDER — SORBITOL 70 % SOLN
30.0000 mL | Freq: Every day | Status: DC | PRN
  Administered 2013-02-16: 30 mL via ORAL
  Filled 2013-02-06 (×2): qty 30

## 2013-02-06 MED ORDER — SENNOSIDES-DOCUSATE SODIUM 8.6-50 MG PO TABS
1.0000 | ORAL_TABLET | Freq: Two times a day (BID) | ORAL | Status: DC
  Administered 2013-02-06 – 2013-02-17 (×14): 1 via ORAL
  Filled 2013-02-06 (×27): qty 1

## 2013-02-06 MED ORDER — VITAMIN B-1 100 MG PO TABS
100.0000 mg | ORAL_TABLET | Freq: Every day | ORAL | Status: DC
  Administered 2013-02-07 – 2013-02-17 (×11): 100 mg via ORAL
  Filled 2013-02-06 (×14): qty 1

## 2013-02-06 MED ORDER — ACETAMINOPHEN 325 MG PO TABS
325.0000 mg | ORAL_TABLET | ORAL | Status: DC | PRN
  Administered 2013-02-07 – 2013-02-15 (×7): 650 mg via ORAL
  Filled 2013-02-06 (×8): qty 2

## 2013-02-06 NOTE — Progress Notes (Signed)
Speech Language Pathology Dysphagia Treatment Patient Details Name: Rick Williams MRN: [id number] DOB: [birth year] Today's Date: 02/06/2013 Time: 0910-0930 SLP Time Calculation (min): 20 min  Assessment / Plan / Recommendation Clinical Impression  F/u at bedside focused on diet tolerance and use of compensatory strategies. Overall, patient able to self feed with moderate verbal and tactile cueing for small bites/sips due to impulsivity. No overt indication of aspiration. Vocal quality remained clear throughout am meal. Noted thin liquids on meal tray. Patient able to verbalize understanding of need for thickened liquids. Reinforced with RN and confirmed order in computer. Overall, patient appears to be tolerating diet. SLP will continue to f/u. Continue to recommend cognitive evaluation. May need to defer to CIR if d/cing today.     Diet Recommendation  Continue with Current Diet: Dysphagia 3 (mechanical soft);Honey-thick liquid    SLP Plan Continue with current plan of care   Pertinent Vitals/Pain None reported   Swallowing Goals  SLP Swallowing Goals Patient will utilize recommended strategies during swallow to increase swallowing safety with: Modified independent assistance Swallow Study Goal #2 - Progress: Progressing toward goal  General Temperature Spikes Noted: No Respiratory Status: Room air Behavior/Cognition: Alert;Cooperative;Pleasant mood Oral Cavity - Dentition: Adequate natural dentition Patient Positioning: Upright in bed      Dysphagia Treatment Treatment focused on: Skilled observation of diet tolerance;Patient/family/caregiver education;Utilization of compensatory strategies Family/Caregiver Educated: spouse Treatment Methods/Modalities: Skilled observation Patient observed directly with PO's: Yes Type of PO's observed: Dysphagia 3 (soft);Honey-thick liquids Feeding: Able to feed self Liquids provided via: Cup Type of cueing: Verbal;Tactile Amount of  cueing: Moderate   GO   Rick Lango MA, CCC-SLP [phone]   Rick Williams 02/06/2013, 10:03 AM

## 2013-02-06 NOTE — Progress Notes (Signed)
Patient admitted to [id number] at 1550, wife at bedside. Patient alert to self, denies pain. Incontinent of bladder on arrival. Discussed safety plan, call bell system, and rehab schedule with wife. Rehab CVA education packet given, patient's wife denies questions, verbalized understanding. Rick Williams

## 2013-02-06 NOTE — Care Management Note (Signed)
    Page 1 of 1   02/06/2013     4:28:41 PM   CARE MANAGEMENT NOTE 02/06/2013  Patient:  Rick Williams, Rick Williams   Account Number:  [id number]  Date Initiated:  02/06/2013  Documentation initiated by:  Jacquelynn Cree  Subjective/Objective Assessment:   admitted with CVA     Action/Plan:   PT/OT evals-recommended inpatent rehab   Anticipated DC Date:  02/06/2013   Anticipated DC Plan:  IP REHAB FACILITY      DC Planning Services  CM consult      Choice offered to / List presented to:             Status of service:  Completed, signed off Medicare Important Message given?   (If response is "NO", the following Medicare IM given date fields will be blank) Date Medicare IM given:   Date Additional Medicare IM given:    Discharge Disposition:  IP REHAB FACILITY  Per UR Regulation:  Reviewed for med. necessity/level of care/duration of stay  If discussed at Long Length of Stay Meetings, dates discussed:    Comments:

## 2013-02-06 NOTE — Progress Notes (Signed)
Patient to be admitted to inpt rehab today. Patient and wife are in agreement. [phone]

## 2013-02-06 NOTE — Discharge Summary (Signed)
Stroke Discharge Summary  Patient ID: Rick Williams   MRN: [id number]      DOB: April 26, 1966  Date of Admission: 01/30/2013 Date of Discharge: 02/06/2013  Attending Physician:  Darcella Cheshire, MD, Stroke MD  Consulting Physician(s):  Treatment Team:  Md Stroke, MD rehabilitation medicine critical care medicine  Patient's PCP:  No PCP Per Patient  Discharge Diagnoses:  Principal Problem:   Hemorrhagic stroke Active Problems:   Acute respiratory failure due to AMS   Hypertension   IVH (intraventricular hemorrhage)   Intracranial hypertension   Essential hypertension, malignant   Encounter for long-term (current) use of high-risk medication BMI  Body mass index is 20.53 kg/(m^2).  Past Medical History  Diagnosis Date  . Hypertension   . Pneumonia   . Hemothorax on left   . Stroke 2007    no deficits   History reviewed. No pertinent past surgical history.  Medications to be continued on Rehab . cloNIDine  0.2 mg Oral BID  . enoxaparin (LOVENOX) injection  40 mg Subcutaneous Q24H  . multivitamin  1 tablet Oral Daily  . nicotine  14 mg Transdermal Daily  . pantoprazole  40 mg Oral Q1200  . potassium chloride  20 mEq Oral BID  . senna-docusate  1 tablet Oral BID  . thiamine  100 mg Oral Daily    LABORATORY STUDIES CBC    Component Value Date/Time   WBC 10.0 02/01/2013 0500   RBC 3.60* 02/01/2013 0500   HGB 12.4* 02/01/2013 0500   HCT 38.0* 02/01/2013 0500   PLT 170 02/01/2013 0500   MCV 105.6* 02/01/2013 0500   MCH 34.4* 02/01/2013 0500   MCHC 32.6 02/01/2013 0500   RDW 13.9 02/01/2013 0500   LYMPHSABS 1.8 01/30/2013 0550   MONOABS 0.6 01/30/2013 0550   EOSABS 0.1 01/30/2013 0550   BASOSABS 0.0 01/30/2013 0550   CMP    Component Value Date/Time   NA 141 02/04/2013 0500   K 4.0 02/04/2013 0500   CL 107 02/04/2013 0500   CO2 23 02/04/2013 0500   GLUCOSE 109* 02/04/2013 0500   BUN 13 02/04/2013 0500   CREATININE 0.89 02/04/2013 0500   CALCIUM 9.0 02/04/2013 0500    PROT 8.7* 01/30/2013 0550   ALBUMIN 3.5 01/30/2013 0550   AST 53* 01/30/2013 0550   ALT 26 01/30/2013 0550   ALKPHOS 62 01/30/2013 0550   BILITOT 0.4 01/30/2013 0550   GFRNONAA >90 02/04/2013 0500   GFRAA >90 02/04/2013 0500   COAGS Lab Results  Component Value Date   INR 1.04 01/30/2013   Lipid Panel    Component Value Date/Time   CHOL 169 02/01/2013 0303   TRIG 137 02/01/2013 0303   HDL 69 02/01/2013 0303   CHOLHDL 2.4 02/01/2013 0303   VLDL 27 02/01/2013 0303   LDLCALC 73 02/01/2013 0303   HgbA1C  Lab Results  Component Value Date   HGBA1C 5.4 02/01/2013   Cardiac Panel (last 3 results) No results found for this basename: CKTOTAL, CKMB, TROPONINI, RELINDX,  in the last 72 hours Urinalysis    Component Value Date/Time   COLORURINE YELLOW 01/30/2013 0721   APPEARANCEUR CLEAR 01/30/2013 0721   LABSPEC 1.016 01/30/2013 0721   PHURINE 7.0 01/30/2013 0721   GLUCOSEU NEGATIVE 01/30/2013 0721   HGBUR MODERATE* 01/30/2013 0721   BILIRUBINUR NEGATIVE 01/30/2013 0721   KETONESUR NEGATIVE 01/30/2013 0721   PROTEINUR >300* 01/30/2013 0721   UROBILINOGEN 0.2 01/30/2013 0721   NITRITE NEGATIVE 01/30/2013 1191  LEUKOCYTESUR NEGATIVE 01/30/2013 0721   Urine Drug Screen     Component Value Date/Time   LABOPIA NONE DETECTED 01/30/2013 0738   COCAINSCRNUR NONE DETECTED 01/30/2013 0738   LABBENZ NONE DETECTED 01/30/2013 0738   AMPHETMU NONE DETECTED 01/30/2013 0738   THCU NONE DETECTED 01/30/2013 0738   LABBARB NONE DETECTED 01/30/2013 0738    Alcohol Level    Component Value Date/Time   ETH  Value: 284        LOWEST DETECTABLE LIMIT FOR SERUM ALCOHOL IS 5 mg/dL FOR MEDICAL PURPOSES ONLY* 02/06/2010 0312    SIGNIFICANT DIAGNOSTIC STUDIES   CT of the brain  01/30/13  Intraparenchymal hemorrhage centered within the right basal ganglia, with intraventricular extension and 8 mm right to left  midline shift.  05/14/2014No increase in the parenchymal hemorrhage. Minimal increase in the blood in the  posterior aspect of the  right lateral ventricle. No change in midline shift.   CXR - 5/13  ET tube tip is at the thoracic inlet. Recommend advancement  1.5-2.5 cm. Lungs are clear   EKG : nonspecific T wave changes   History of Present Illness    Rick Williams is an 47 y.o. male with a past medical history significant for hypertension, poorly adherent to treatment, ETOH use, stroke without apparent residual deficits, brought to [facility] ED by medics as a code stroke after being found on the floor with decreased responsiveness and left hemiplegia.   Rick Williams said that he hasn't been taking his BP medications for the last 3 months and at 2230 developed severe headache for which he took Benadryl to no avail. Then, his girlfriend reports finding him on the floor, less communicative, and not able to move the left side.   According to EMS his BP at the scene was 190/110 and he was completely hemiplegic in the left side. On route to the hospital he was responding only to sternal rub.   NIHSS 10. Emergent CT brain revealed a large right ICH centered around the BG, with intraventricular extension and 8 mm right to let midline shift.   After returning from CT he started to demonstrate declining mental status and the decision was made to proceed with early intubation. Still with SBP above 190 and nicardipine drip started.   Date last known well: 01/30/13  Time last known well: 2230   Patient was not a TPA candidate secondary to ICH. He was admitted to the neuro ICU for further evaluation and treatment   Hospital Course    Rick Williams is a 47 y.o. male presenting with malignant HTN and large right basal ganglia ICH with cytotoxic edema and Intraventricular extension with midline shift likely secondary to malignant HTN, BP on arrival was 190/110. During the initial stay, the patient was placed on 3% saline for cytotoxic edema. He also required a brief vent support to protect airway  while in the ICU.  Patient's BP has been treated while as inpatient with restarting of clonidine which he was reported taking at home. He does continue to have problems with oral intake and is on a dysphagia 3 diet.He will be transferred to rehab today to continue progressive physical, occupational and speech therapy. Continue to work on risk factor modification. LDL 73, hgb a1c was 5.4. Recommend smoking cessation. Patient's caregivers is/are supportive and can provide care at discharge. CIR bed is available today and patient will be transferred there.  Discharge Exam  Blood pressure 143/86, pulse 68, temperature 98.6 F (  37 C), temperature source Oral, resp. rate 18, height 5\' 10"  (1.778 m), weight 64.9 kg (143 lb 1.3 oz), SpO2 98.00%.   Physical Exam  Afebrile. Head is nontraumatic. Neck is supple without bruit. Cardiac exam no murmur or gallop. Lungs are clear to auscultation. Distal pulses are well felt.  Neurologic Exam:  Patient is alert ,awake and oriented. . Pupils are 3 mm equal reactive. Right gaze preference but can move the eyes to midline on reflex eye movements. Fundi were not visualized. Left lower facial weakness. Tongue is midline. Patient will open eyes and follow commands. He has spontaneous right sided movements when stimulated and purposeful antigravity movements . Improving left sided motor strength now 3/5. Plantar is upgoing. Right is downgoing.  Discharge Diet  Dysphagia 3   Discharge Plan  Disposition:  Transfer to [facility] for ongoing PT, OT and ST  no anticoagulants for secondary stroke prevention due to bleed.  Ongoing risk factor control by Primary Care Physician. Risk factor recommendations:  Hypertension target range 130-140/70-80 Lipid range - LDL < 100 and checked every 6 months, fasting Diabetes - HgB A1C <7 Smoking cessation counseling   Follow-up with primary MD in 1 month. If patient does not have one, this will need to be  established.  Follow-up with Dr. Delia Heady in 2 months.  45 minutes were spent preparing discharge.  Signed Gwendolyn Lima. Manson Passey, [facility], MBA, MHA [facility] Pager: [phone] 02/06/2013 1:22 PM  I have personally examined this patient, reviewed pertinent data and developed the plan of care. I agree with above.   Suanne Marker, MD 02/06/2013, 5:31 PM Certified in Neurology, Neurophysiology and Neuroimaging [facility] - Stroke Team  Please refer to [email or web address] for on-call Stroke MD

## 2013-02-06 NOTE — H&P (Signed)
Physical Medicine and Rehabilitation Admission H&P    Chief Complaint  Patient presents with  . Code Stroke  : HPI: Rick Williams is a 46 y.o. 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 Patient independent prior to admission working full-time. Admitted 01/30/2013 of left-sided weakness and altered mental status. Reports the patient had not been taking his blood pressure medications for 3 months. He was found on the floor by his wife. Blood pressure 190/110. 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 Patient was not a TPA candidate secondary to ICH. Patient was placed on the nicardipine drip for blood pressure control. Neurology services followup with conservative care. Followup cranial CT scan 01/31/2013 without change. Subcutaneous Lovenox initiated 02/01/2013 for DVT prophylaxis. NicoDerm patch added for history of tobacco abuse. Patient was maintained on ventilator support for a short time followed by critical care medicine. Blood pressure still with variables and remains on antihypertensive medication. Followup speech therapy for dysphagia and currently maintained on dysphagia 3 honey thick liquids.Physical therapy evaluation completed 02/01/2013 as well as occupational therapy with noted decrease in balance and being impulsive. Recommendations made for physical medicine rehabilitation consult to consider inpatient rehabilitation services. Patient was felt to be a candidate for inpatient rehabilitation services and was admitted for a comprehensive rehabilitation program  Review of Systems  Gastrointestinal: Positive for nausea.  Neurological: Positive for weakness and headaches.  All other systems reviewed and are negative   Past Medical History  Diagnosis Date  . Hypertension   . Pneumonia   .  Hemothorax on left   . Stroke 2007    no deficits   History reviewed. No pertinent past surgical history. History reviewed. No pertinent family history. Social History:  reports that he has been smoking Cigarettes.  He has been smoking about 1.00 pack per day. He does not have any smokeless tobacco history on file. He reports that  drinks alcohol. He reports that he does not use illicit drugs. Allergies:  Allergies  Allergen Reactions  . Benadryl (Diphenhydramine Hcl) Itching   No prescriptions prior to admission    Home: Home Living Lives With: Spouse Available Help at Discharge: Family;Available 24 hours/day Type of Home: House Home Access: Stairs to enter Entrance Stairs-Number of Steps: 2 to 3 steps Entrance Stairs-Rails: None Home Layout: One level Bathroom Shower/Tub: Tub/shower unit;Curtain Bathroom Toilet: Standard Bathroom Accessibility: Yes How Accessible: Accessible via walker Home Adaptive Equipment: None Additional Comments: wife requesting to be his paid caregiver once he gets approval for medicaid for she is currently a sitter   Functional History: Prior Function Able to Take Stairs?: Yes Driving: No Vocation: Full time employment  Functional Status:  Mobility: Bed Mobility Bed Mobility: Supine to Sit;Sitting - Scoot to Edge of Bed Supine to Sit: 4: Min guard;HOB flat Sitting - Scoot to Edge of Bed: 4: Min guard Sit to Supine: 4: Min assist Transfers Transfers: Sit to Stand;Stand to Sit Sit to Stand: 3: Mod assist;With upper extremity assist;From bed Stand to Sit: 3: Mod assist;With upper extremity assist;With armrests;To chair/3-in-1 Ambulation/Gait Ambulation/Gait Assistance: 3: Mod assist (+1 for lines) Ambulation Distance (Feet): 78 Feet Assistive device: Rolling walker Ambulation/Gait Assistance Details: Assist for Lt hand on RW.  Verbal cues for sequencing, and to take longer step with LLE.  Continues to have decreased lateral weight shift and  decreased control of   LLE in stance and swing. Gait Pattern: Step-through pattern;Decreased stance time - left;Decreased step length - left;Decreased step length - right;Decreased weight shift to right;Ataxic;Trunk flexed;Narrow base of support Gait velocity: Slow gait speed General Gait Details: Ataxic movement with decreased awareness of Lt side during gait.    ADL: ADL Eating/Feeding: NPO Upper Body Bathing: Simulated;Moderate assistance Where Assessed - Upper Body Bathing: Unsupported sitting Lower Body Bathing: Simulated;Moderate assistance Where Assessed - Lower Body Bathing: Supported sit to stand Upper Body Dressing: Performed;Moderate assistance Where Assessed - Upper Body Dressing: Unsupported sitting Lower Body Dressing: Simulated;Moderate assistance Where Assessed - Lower Body Dressing: Supported sit to stand Toilet Transfer: Simulated;Moderate assistance Toilet Transfer Method:  (ambulating) Toilet Transfer Equipment:  (bed to ambulate in hall then return to bed) Equipment Used: Gait belt;Rolling walker Transfers/Ambulation Related to ADLs: Mod assist with RW. Manual cues through left hip and bil shoulders for midline positioning and for advancing LLE.  Pt with heavy left lateral lean. Also assist to attend to LUE on RW ( L UE occasionally falling to side while trying to push RW). ADL Comments: Pt requiring assist to attend to left side, especially L UE positioning.   Cognition: Cognition Overall Cognitive Status: Impaired/Different from baseline Arousal/Alertness: Lethargic Orientation Level: Oriented X4 Cognition Arousal/Alertness: Lethargic Behavior During Therapy: Impulsive Overall Cognitive Status: Impaired/Different from baseline Area of Impairment: Orientation;Attention;Memory;Safety/judgement;Problem solving Orientation Level: Disoriented to;Time;Situation Current Attention Level: Sustained Memory: Decreased short-term memory Safety/Judgement: Decreased  awareness of safety;Decreased awareness of deficits Problem Solving: Difficulty sequencing;Requires verbal cues;Requires tactile cues General Comments: Focused on getting Mountain Dew; difficulty managing left UE and when putting on sock needed assist to get left UE in proper position due to lack of awareness  Physical Exam: Blood pressure 147/87, pulse 59, temperature 97.7 F (36.5 C), temperature source Axillary, resp. rate 18, height 5' 10" (1.778 m), weight 64.9 kg (143 lb 1.3 oz), SpO2 98.00%. Physical Exam  Vitals reviewed.  Constitutional: He is oriented to person, place, and time. No distress HENT:  Head: Normocephalic. Anicteric, atraumatic Eyes:  Pupils round and reactive to light  Neck: Neck supple. No thyromegaly present.  Cardiovascular: Normal rate and regular rhythm. No MRG Pulmonary/Chest: Effort normal and breath sounds normal. No respiratory distress. No wheezes,rales, rhonchi Abdominal: Soft. Bowel sounds are normal. He exhibits no distension.  Musculoskeletal: He exhibits no edema.  Neurological: He is alert and oriented to person, place, and time.  . Decreased awareness of left-sided weakness and was impulsive. Follow simple commands. Left central 7 and tongue deviation. Dysarthric speech. LUE is 3 to 3+/5 prox to distal. LLE is 3 to 4/5 prox to distal. Decreased FMC. Sensation is 1+ on left. Cognitively with fair insight and awareness.  Skin: Skin is warm and dry Psych: anxious, irritable. Follows simple commands   Results for orders placed during the hospital encounter of 01/30/13 (from the past 48 hour(s))  GLUCOSE, CAPILLARY     Status: Abnormal   Collection Time    02/05/13 11:10 AM      Result Value Range   Glucose-Capillary 113 (*) 70 - 99 mg/dL   Dg Chest 2 View  02/04/2013   *RADIOLOGY REPORT*  Clinical Data: Cough.  CHEST - 2 VIEW  Comparison: 01/31/2013  Findings: There is prominent peribronchial thickening on the right consistent with bronchitis.  The  lung bases have cleared. Increased density over the thoracic spine on the lateral view is felt to be due to overlying soft tissues and the scapulae.  No effusions.    Heart size and vascularity are normal.  IMPRESSION: Bronchitic changes.  Clearing of the lung bases.   Original Report Authenticated By: James Maxwell, M.D.    Post Admission Physician Evaluation: 1. Functional deficits secondary  to large right basal ganglia hemorrhage. . 2. Patient is admitted to receive collaborative, interdisciplinary care between the physiatrist, rehab nursing staff, and therapy team. 3. Patient's level of medical complexity and substantial therapy needs in context of that medical necessity cannot be provided at a lesser intensity of care such as a SNF. 4. Patient has experienced substantial functional loss from his/her baseline which was documented above under the "Functional History" and "Functional Status" headings.  Judging by the patient's diagnosis, physical exam, and functional history, the patient has potential for functional progress which will result in measurable gains while on inpatient rehab.  These gains will be of substantial and practical use upon discharge  in facilitating mobility and self-care at the household level. 5. Physiatrist will provide 24 hour management of medical needs as well as oversight of the therapy plan/treatment and provide guidance as appropriate regarding the interaction of the two. 6. 24 hour rehab nursing will assist with bladder management, bowel management, safety, skin/wound care, disease management, medication administration, pain management and patient education  and help integrate therapy concepts, techniques,education, etc. 7. PT will assess and treat for/with: Lower extremity strength, range of motion, stamina, balance, functional mobility, safety, adaptive techniques and equipment, NMR, cognitive behavioral therapy, education .   Goals are: supervision to minimal  assist. 8. OT will assess and treat for/with: ADL's, functional mobility, safety, upper extremity strength, adaptive techniques and equipment, NMR, education, cognitive behavioral therapy.   Goals are: supervision to minimal assist. 9. SLP will assess and treat for/with: cognition.  Goals are: supervision to ?mod I. 10. Case Management and Social Worker will assess and treat for psychological issues and discharge planning. 11. Team conference will be held weekly to assess progress toward goals and to determine barriers to discharge. 12. Patient will receive at least 3 hours of therapy per day at least 5 days per week. 13. ELOS: 2-3 weeks      Prognosis:  excellent   Medical Problem List and Plan: 1. 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 DVT Prophylaxis/Anticoagulation: Subcutaneous Lovenox initiated 02/01/2013. Monitor platelet counts any signs of bleeding 3. Pain Management/headaches. Tramadol 100 mg every 6 hours as needed. Monitor with increased activity 4. Neuropsych: This patient is not capable of making decisions on his/her own behalf. 5. Dysphagia. Dysphagia 3 honey thick liquids. Monitor for any signs of aspiration. Continue IV fluids for hydration 6. Hypertension with poor medical compliance. Clonidine 0.2 mg twice a day. Monitor with increased activity. Provide counseling in regards to hypertension. 7. Tobacco/alcohol. NicoDerm patch. Provide counseling  Acquanetta Cabanilla T. Anjela Cassara, MD, FAAPMR  02/06/2013 

## 2013-02-06 NOTE — Plan of Care (Signed)
Overall Plan of Care [facility]) Patient Details Name: Rick Williams MRN: [id number] DOB: [birth year]  Diagnosis:  Large right basal ganglia ICH  Co-morbidities: htn, dysphagia, tobacco and etoh abuse  Functional Problem List  Patient demonstrates impairments in the following areas: Balance, Bladder, Bowel, Cognition, Medication Management, Motor, Perception and Safety  Basic ADL's: eating, grooming, bathing, dressing and toileting Advanced ADL's: simple meal preparation and light housekeeping  Transfers:  bed mobility, bed to chair, toilet, tub/shower, car, furniture and floor Locomotion:  ambulation and stairs  Additional Impairments:  Functional use of upper extremity, Swallowing and Social Cognition   problem solving, memory, attention and awareness  Anticipated Outcomes Item Anticipated Outcome  Eating/Swallowing  Supervision with least restrictive diet  Basic self-care  supervision  Tolieting  supervision  Bowel/Bladder  Continent bowel/bladder with mod independence   Transfers  supervision  Locomotion  Supervision ambulation and stairs  Communication    Cognition  Min A  Pain  Pain 3 or less managed with prn medications   Safety/Judgment  Min A  Other  Skin: no new breakdown while on CIR   Therapy Plan: PT Intensity: Minimum of 1-2 x/day ,45 to 90 minutes PT Frequency: 5 out of 7 days PT Duration Estimated Length of Stay: 10 - 12 days OT Intensity: Minimum of 1-2 x/day, 45 to 90 minutes OT Frequency: 5 out of 7 days OT Duration/Estimated Length of Stay: 7-10 days SLP Intensity: Minumum of 1-2 x/day, 30 to 90 minutes SLP Frequency: 5 out of 7 days SLP Duration/Estimated Length of Stay: 10-12 days    Team Interventions: Item RN PT OT SLP SW TR Other  Self Care/Advanced ADL Retraining   x      Neuromuscular Re-Education   x      Therapeutic Activities  x x x     UE/LE Strength Training/ROM  x x      UE/LE Coordination Activities  x x       Visual/Perceptual Remediation/Compensation   x      DME/Adaptive Equipment Instruction   x      Therapeutic Exercise  x x      Balance/Vestibular Training  x x      Patient/Family Education x x x x     Cognitive Remediation/Compensation   x x     Functional Mobility Training  x x      Ambulation/Gait Training  x       Furniture conservator/restorer Reintegration  x       Dysphagia/Aspiration Printmaker    x     Speech/Language Facilitation         Bladder Management x        Bowel Management x        Disease Management/Prevention x        Pain Management x        Medication Management x        Skin Care/Wound Management x        Splinting/Orthotics         Discharge Planning  x x x     Psychosocial Support    x                        Team Discharge Planning: Destination: PT-Home ,OT- Home , SLP-Home Projected  Follow-up: PT-24 hour supervision/assistance;Outpatient PT, OT-  Outpatient OT, SLP-Outpatient SLP;24 hour supervision/assistance Projected Equipment Needs: PT-Other (comment) (To be determined), OT- Tub/shower seat, SLP-None recommended by SLP Patient/family involved in discharge planning: PT- Patient;Family member/caregiver,  OT-Patient;Family member/caregiver, SLP-Patient;Family member/caregiver  MD ELOS: 10-12 days Medical Rehab Prognosis:  Excellent Assessment: The patient has been admitted for CIR therapies. The team will be addressing, functional mobility, strength, stamina, balance, safety, adaptive techniques/equipment, self-care, bowel and bladder mgt, patient and caregiver education, cognition, communication. Goals have been set at supervision to minimal assist.    Ranelle Oyster, MD, [facility]     See Team Conference Notes for weekly updates to the plan of care

## 2013-02-06 NOTE — Progress Notes (Signed)
NUTRITION FOLLOW UP  Intervention:   1. Magic cup TID between meals, each supplement provides 290 kcal and 9 grams of protein.  Nutrition Dx:   Inadequate oral intake now related to increased nutrient needs as evidenced by estimated needs; ongoing.   Goal:   Pt to meet >/= 90% of their estimated nutrition needs; not met.   Monitor:   Swallow ability, weight trend, labs, PO intake, supplement acceptance  Assessment:   Pt with large right basal ganglia ICH with cytotoxic edema and intraventricular extension likely due to malignant HTN. Pt with hx of daily ETOH use. Pt on MVI.  Pt extubated 5/15.  Pt diet advanced 5/16 made NPO on 5/17, diet readvanced 9/19.  Some level of malnutrition suspected on admission due to weight loss hx and poor diet hx. Plan is for rehab.   Height: Ht Readings from Last 1 Encounters:  02/02/13 5\' 10"  (1.778 m)    Weight Status:   Wt Readings from Last 1 Encounters:  02/02/13 143 lb 1.3 oz (64.9 kg)  Admission weight 153 lb  Re-estimated needs:  Kcal: 2000-2200 Protein: 100-120 grams  Fluid: > 2 L/day  Skin: no issues noted  Diet Order: Dysphagia 3 with Honey Thick Liquids   Intake/Output Summary (Last 24 hours) at 02/06/13 0814 Last data filed at 02/05/13 2155  Gross per 24 hour  Intake 558.33 ml  Output    600 ml  Net -41.67 ml    Last BM: 5/18   Labs:   Recent Labs Lab 01/31/13 2145 02/01/13 0303  02/01/13 2215 02/02/13 0515 02/04/13 0500  NA 153* 154*  < > 150* 146* 141  K  --  3.5  --   --  3.0* 4.0  CL  --  123*  --   --  111 107  CO2  --  24  --   --  24 23  BUN  --  19  --   --  9 13  CREATININE  --  1.05  --   --  0.89 0.89  CALCIUM  --  8.2*  --   --  9.0 9.0  MG  --  2.1  --   --  1.5  --   PHOS  --  2.4  --   --  2.9  --   GLUCOSE  --  111*  --   --  112* 109*  < > = values in this interval not displayed.  CBG (last 3)   Recent Labs  02/05/13 1110  GLUCAP 113*    Scheduled Meds: . antiseptic oral  rinse  15 mL Mouth Rinse QID  . chlorhexidine  15 mL Mouth Rinse BID  . cloNIDine  0.2 mg Oral BID  . enoxaparin (LOVENOX) injection  40 mg Subcutaneous Q24H  . multivitamin  1 tablet Oral Daily  . nicotine  14 mg Transdermal Daily  . pantoprazole  40 mg Oral Q1200  . potassium chloride  20 mEq Oral BID  . senna-docusate  1 tablet Oral BID  . thiamine  100 mg Oral Daily    Continuous Infusions: . sodium chloride 50 mL/hr at 02/05/13 [address] RD, LDN, CNSC [phone] Pager [phone] After Hours Pager

## 2013-02-06 NOTE — Interval H&P Note (Signed)
Rick Williams was admitted today to Inpatient Rehabilitation with the diagnosis of right basal ganglia hemorrhage.  The patient's history has been reviewed, patient examined, and there is no change in status.  Patient continues to be appropriate for intensive inpatient rehabilitation.  I have reviewed the patient's chart and labs.  Questions were answered to the patient's satisfaction.  Sherlonda Flater T 02/06/2013, 10:54 PM

## 2013-02-06 NOTE — Progress Notes (Signed)
Physical Therapy Treatment Patient Details Name: Rick Williams MRN: [id number] DOB: 1966/06/07 Today's Date: 02/06/2013 Time: 4098-1191 PT Time Calculation (min): 14 min  PT Assessment / Plan / Recommendation Comments on Treatment Session  47 y.o. male admitted to [facility] with left sided weakness.  His scans revealed a large right ICH centered around the BG, with intraventricular extension and 8 mm right to let midline shift.  He is progressing quickly now with his mobility requiring only min assist for gait with and without RW.  He has very poor awarenss of his deficits and safety.  He is quick to move without being balanced, runs into things on his left side and still says he feels like there is nothing wrong with how he is walking.  CIR continues to be his best discharge destination.      Follow Up Recommendations  CIR     Does the patient have the potential to tolerate intense rehabilitation    Yes  Barriers to Discharge   none      Equipment Recommendations  None recommended by PT    Recommendations for Other Services   none  Frequency Min 4X/week   Plan Discharge plan remains appropriate;Frequency remains appropriate    Precautions / Restrictions Precautions Precautions: Fall Precaution Comments: decreased left side awareness   Pertinent Vitals/Pain See vitals flow sheet.     Mobility  Bed Mobility Supine to Sit: 5: Supervision;HOB flat Sitting - Scoot to Edge of Bed: 5: Supervision Transfers Sit to Stand: 4: Min guard;With upper extremity assist;From bed Stand to Sit: 4: Min guard;With upper extremity assist;To bed Details for Transfer Assistance: min guard assist for balance, verbal cues for safe hand placement.   Ambulation/Gait Ambulation/Gait Assistance: 4: Min assist Ambulation Distance (Feet): 150 Feet Assistive device: Rolling walker Ambulation/Gait Assistance Details: Assist at first to keep left hand maintained on RW.  Pt's gaze is to the right and pt  running into things on his left side.  Did take RW away for last 73' of gait.  Still min assist with staggering gait pattern.  LOB while turning and when navigating obstacles on his left.  Pt reporting he feels like he is walking well, normally.   Gait Pattern: Step-through pattern;Lateral trunk lean to left General Gait Details: decreased left leg coordination with foot placement during gait.  Verbal cues to correct.        PT Goals Acute Rehab PT Goals Pt will go Supine/Side to Sit: with modified independence;with HOB 0 degrees PT Goal: Supine/Side to Sit - Progress: Updated due to goal met Pt will go Sit to Stand: with modified independence;with upper extremity assist PT Goal: Sit to Stand - Progress: Updated due to goal met PT Goal: Stand - Progress: Progressing toward goal PT Goal: Ambulate - Progress: Progressing toward goal Additional Goals Additional Goal #1: Patient to demonstrate increased left side awareness by ability to complete simple ADL task with supervision and min questioning cues for left UE management. PT Goal: Additional Goal #1 - Progress: Progressing toward goal  Visit Information  Last PT Received On: 02/06/13 Assistance Needed: +1    Subjective Data  Subjective: Pt reports no HA today   Cognition  Cognition Arousal/Alertness: Awake/alert Behavior During Therapy: Impulsive Overall Cognitive Status: Impaired/Different from baseline Area of Impairment: Safety/judgement Safety/Judgement: Decreased awareness of safety;Decreased awareness of deficits Problem Solving: Difficulty sequencing;Requires verbal cues;Requires tactile cues General Comments: Pt reports he feels normal, balance is normal despite staggering gait, needed external assist and running  into things on his left side.      Balance  Static Standing Balance Static Standing - Balance Support: No upper extremity supported Static Standing - Level of Assistance: 4: Min assist  End of Session PT - End  of Session Activity Tolerance: Patient tolerated treatment well Patient left: in chair;with family/visitor present    Lurena Joiner B. Iley Breeden, PT, DPT [phone]   02/06/2013, 1:49 PM

## 2013-02-06 NOTE — Progress Notes (Signed)
Arrived to room with wife assisting pt from wheelchair into bathroom unassisted by staff. Patient unsteady standing to use restroom. I  Provided wheelchair and requested pt to let wife and myself to assist him back into wheelchair. Discussed the need for pt and wife not to mobilize in room unassisted as I had discussed with them yesterday.. Patient returned to wheelchair but would not let his wife hold onto his arm to assist him to wheelchair. I made RN aware of situation. I also changed diet order to Dysphagia 3 diet with honey thick liquids as recommended by SLP. I contacted Guy Franco , PA to request clarification that I can admit pt to inpt rehab today if felt medically ready. I await their clarification. I will follow up with pt and wife to clarify if they would like to admit to inpt rehab today if pt would like more assistance in improving his functional level before discharge home as he is felt currently not functionally ready to d/c home.[phone]

## 2013-02-06 NOTE — H&P (View-Only) (Signed)
Physical Medicine and Rehabilitation Admission H&P    Chief Complaint  Patient presents with  . Code Stroke  : HPI: Rick Williams is a 47 y.o. 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 Patient independent prior to admission working full-time. Admitted 01/30/2013 of left-sided weakness and altered mental status. Reports the patient had not been taking his blood pressure medications for 3 months. He was found on the floor by his wife. Blood pressure 190/110. 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 Patient was not a TPA candidate secondary to ICH. Patient was placed on the nicardipine drip for blood pressure control. Neurology services followup with conservative care. Followup cranial CT scan 01/31/2013 without change. Subcutaneous Lovenox initiated 02/01/2013 for DVT prophylaxis. NicoDerm patch added for history of tobacco abuse. Patient was maintained on ventilator support for a short time followed by critical care medicine. Blood pressure still with variables and remains on antihypertensive medication. Followup speech therapy for dysphagia and currently maintained on dysphagia 3 honey thick liquids.Physical therapy evaluation completed 02/01/2013 as well as occupational therapy with noted decrease in balance and being impulsive. Recommendations made for physical medicine rehabilitation consult to consider inpatient rehabilitation services. Patient was felt to be a candidate for inpatient rehabilitation services and was admitted for a comprehensive rehabilitation program  Review of Systems  Gastrointestinal: Positive for nausea.  Neurological: Positive for weakness and headaches.  All other systems reviewed and are negative   Past Medical History  Diagnosis Date  . Hypertension   . Pneumonia   .  Hemothorax on left   . Stroke 2007    no deficits   History reviewed. No pertinent past surgical history. History reviewed. No pertinent family history. Social History:  reports that he has been smoking Cigarettes.  He has been smoking about 1.00 pack per day. He does not have any smokeless tobacco history on file. He reports that  drinks alcohol. He reports that he does not use illicit drugs. Allergies:  Allergies  Allergen Reactions  . Benadryl (Diphenhydramine Hcl) Itching   No prescriptions prior to admission    Home: Home Living Lives With: Spouse Available Help at Discharge: Family;Available 24 hours/day Type of Home: House Home Access: Stairs to enter [employer] of Steps: 2 to 3 steps Entrance Stairs-Rails: None Home Layout: One level Bathroom Shower/Tub: Forensic scientist: Standard Bathroom Accessibility: Yes How Accessible: Accessible via walker Home Adaptive Equipment: None Additional Comments: wife requesting to be his paid caregiver once he gets approval for medicaid for she is currently a Manufacturing systems engineer History: Prior Function Able to Take Stairs?: Yes Driving: No Vocation: Full time employment  Functional Status:  Mobility: Bed Mobility Bed Mobility: Supine to Sit;Sitting - Scoot to Edge of Bed Supine to Sit: 4: Min guard;HOB flat Sitting - Scoot to [place] of Bed: 4: Min guard Sit to Supine: 4: Min assist Transfers Transfers: Sit to Stand;Stand to Sit Sit to Stand: 3: Mod assist;With upper extremity assist;From bed Stand to Sit: 3: Mod assist;With upper extremity assist;With armrests;To chair/3-in-1 Ambulation/Gait Ambulation/Gait Assistance: 3: Mod assist (+1 for lines) Ambulation Distance (Feet): 78 Feet Assistive device: Rolling walker Ambulation/Gait Assistance Details: Assist for Lt hand on RW.  Verbal cues for sequencing, and to take longer step with LLE.  Continues to have decreased lateral weight shift and  decreased control of  LLE in stance and swing. Gait Pattern: Step-through pattern;Decreased stance time - left;Decreased step length - left;Decreased step length - right;Decreased weight shift to right;Ataxic;Trunk flexed;Narrow base of support Gait velocity: Slow gait speed General Gait Details: Ataxic movement with decreased awareness of Lt side during gait.    ADL: ADL Eating/Feeding: NPO Upper Body Bathing: Simulated;Moderate assistance Where Assessed - Upper Body Bathing: Unsupported sitting Lower Body Bathing: Simulated;Moderate assistance Where Assessed - Lower Body Bathing: Supported sit to stand Upper Body Dressing: Performed;Moderate assistance Where Assessed - Upper Body Dressing: Unsupported sitting Lower Body Dressing: Simulated;Moderate assistance Where Assessed - Lower Body Dressing: Supported sit to Pharmacist, hospital: Simulated;Moderate assistance Toilet Transfer Method:  (ambulating) Toilet Transfer Equipment:  (bed to ambulate in hall then return to bed) Equipment Used: Gait belt;Rolling walker Transfers/Ambulation Related to ADLs: Mod assist with RW. Manual cues through left hip and bil shoulders for midline positioning and for advancing LLE.  Pt with heavy left lateral lean. Also assist to attend to LUE on RW ( L UE occasionally falling to side while trying to push RW). ADL Comments: Pt requiring assist to attend to left side, especially L UE positioning.   Cognition: Cognition Overall Cognitive Status: Impaired/Different from baseline Arousal/Alertness: Lethargic Orientation Level: Oriented X4 Cognition Arousal/Alertness: Lethargic Behavior During Therapy: Impulsive Overall Cognitive Status: Impaired/Different from baseline Area of Impairment: Orientation;Attention;Memory;Safety/judgement;Problem solving Orientation Level: Disoriented to;Time;Situation Current Attention Level: Sustained Memory: Decreased short-term memory Safety/Judgement: Decreased  awareness of safety;Decreased awareness of deficits Problem Solving: Difficulty sequencing;Requires verbal cues;Requires tactile cues General Comments: Focused on getting [facility]; difficulty managing left UE and when putting on sock needed assist to get left UE in proper position due to lack of awareness  Physical Exam: Blood pressure 147/87, pulse 59, temperature 97.7 F (36.5 C), temperature source Axillary, resp. rate 18, height 5\' 10"  (1.778 m), weight 64.9 kg (143 lb 1.3 oz), SpO2 98.00%. Physical Exam  Vitals reviewed.  Constitutional: He is oriented to person, place, and time. No distress HENT:  Head: Normocephalic. Anicteric, atraumatic Eyes:  Pupils round and reactive to light  Neck: Neck supple. No thyromegaly present.  Cardiovascular: Normal rate and regular rhythm. No MRG Pulmonary/Chest: Effort normal and breath sounds normal. No respiratory distress. No wheezes,rales, rhonchi Abdominal: Soft. Bowel sounds are normal. He exhibits no distension.  Musculoskeletal: He exhibits no edema.  Neurological: He is alert and oriented to person, place, and time.  . Decreased awareness of left-sided weakness and was impulsive. Follow simple commands. Left central 7 and tongue deviation. Dysarthric speech. LUE is 3 to 3+/5 prox to distal. LLE is 3 to 4/5 prox to distal. Decreased FMC. Sensation is 1+ on left. Cognitively with fair insight and awareness.  Skin: Skin is warm and dry Psych: anxious, irritable. Follows simple commands   Results for orders placed during the hospital encounter of 01/30/13 (from the past 48 hour(s))  GLUCOSE, CAPILLARY     Status: Abnormal   Collection Time    02/05/13 11:10 AM      Result Value Range   Glucose-Capillary 113 (*) 70 - 99 mg/dL   Dg Chest 2 View  0/45/4098   *RADIOLOGY REPORT*  Clinical Data: Cough.  CHEST - 2 VIEW  Comparison: 01/31/2013  Findings: There is prominent peribronchial thickening on the right consistent with bronchitis.  The  lung bases have cleared. Increased density over the thoracic spine on the lateral view is felt to be due to overlying soft tissues and the scapulae.  No effusions.  Heart size and vascularity are normal.  IMPRESSION: Bronchitic changes.  Clearing of the lung bases.   Original Report Authenticated By: Francene Boyers, M.D.    Post Admission Physician Evaluation: 1. Functional deficits secondary  to large right basal ganglia hemorrhage. . 2. Patient is admitted to receive collaborative, interdisciplinary care between the physiatrist, rehab nursing staff, and therapy team. 3. Patient's level of medical complexity and substantial therapy needs in context of that medical necessity cannot be provided at a lesser intensity of care such as a SNF. 4. Patient has experienced substantial functional loss from his/her baseline which was documented above under the "Functional History" and "Functional Status" headings.  Judging by the patient's diagnosis, physical exam, and functional history, the patient has potential for functional progress which will result in measurable gains while on inpatient rehab.  These gains will be of substantial and practical use upon discharge  in facilitating mobility and self-care at the household level. 5. Physiatrist will provide 24 hour management of medical needs as well as oversight of the therapy plan/treatment and provide guidance as appropriate regarding the interaction of the two. 6. 24 hour rehab nursing will assist with bladder management, bowel management, safety, skin/wound care, disease management, medication administration, pain management and patient education  and help integrate therapy concepts, techniques,education, etc. 7. PT will assess and treat for/with: Lower extremity strength, range of motion, stamina, balance, functional mobility, safety, adaptive techniques and equipment, NMR, cognitive behavioral therapy, education .   Goals are: supervision to minimal  assist. 8. OT will assess and treat for/with: ADL's, functional mobility, safety, upper extremity strength, adaptive techniques and equipment, NMR, education, cognitive behavioral therapy.   Goals are: supervision to minimal assist. 9. SLP will assess and treat for/with: cognition.  Goals are: supervision to ?mod I. 10. Case Management and Social Worker will assess and treat for psychological issues and discharge planning. 11. Team conference will be held weekly to assess progress toward goals and to determine barriers to discharge. 12. Patient will receive at least 3 hours of therapy per day at least 5 days per week. 13. ELOS: 2-3 weeks      Prognosis:  excellent   Medical Problem List and Plan: 1. 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 DVT Prophylaxis/Anticoagulation: Subcutaneous Lovenox initiated 02/01/2013. Monitor platelet counts any signs of bleeding 3. Pain Management/headaches. Tramadol 100 mg every 6 hours as needed. Monitor with increased activity 4. Neuropsych: This patient is not capable of making decisions on his/her own behalf. 5. Dysphagia. Dysphagia 3 honey thick liquids. Monitor for any signs of aspiration. Continue IV fluids for hydration 6. Hypertension with poor medical compliance. Clonidine 0.2 mg twice a day. Monitor with increased activity. Provide counseling in regards to hypertension. 7. Tobacco/alcohol. NicoDerm patch. Provide counseling  Ranelle Oyster, MD, [place] [facility]  02/06/2013

## 2013-02-07 ENCOUNTER — Inpatient Hospital Stay (HOSPITAL_COMMUNITY): Payer: Self-pay | Admitting: Occupational Therapy

## 2013-02-07 ENCOUNTER — Inpatient Hospital Stay (HOSPITAL_COMMUNITY): Payer: MEDICAID | Admitting: Speech Pathology

## 2013-02-07 ENCOUNTER — Inpatient Hospital Stay (HOSPITAL_COMMUNITY): Payer: MEDICAID | Admitting: Occupational Therapy

## 2013-02-07 ENCOUNTER — Inpatient Hospital Stay (HOSPITAL_COMMUNITY): Payer: MEDICAID

## 2013-02-07 DIAGNOSIS — I619 Nontraumatic intracerebral hemorrhage, unspecified: Secondary | ICD-10-CM

## 2013-02-07 LAB — COMPREHENSIVE METABOLIC PANEL
ALT: 30 U/L (ref 0–53)
AST: 42 U/L — ABNORMAL HIGH (ref 0–37)
Albumin: 3.2 g/dL — ABNORMAL LOW (ref 3.5–5.2)
Alkaline Phosphatase: 55 U/L (ref 39–117)
BUN: 18 mg/dL (ref 6–23)
CO2: 21 mEq/L (ref 19–32)
Calcium: 9 mg/dL (ref 8.4–10.5)
Chloride: 101 mEq/L (ref 96–112)
Creatinine, Ser: 0.82 mg/dL (ref 0.50–1.35)
GFR calc Af Amer: >90 mL/min (ref >90)
GFR calc non Af Amer: >90 mL/min (ref >90)
Glucose, Bld: 96 mg/dL (ref 70–99)
Potassium: 3.8 mEq/L (ref 3.5–5.1)
Sodium: 133 mEq/L — ABNORMAL LOW (ref 135–145)
Total Bilirubin: 0.4 mg/dL (ref 0.3–1.2)
Total Protein: 8.2 g/dL (ref 6.0–8.3)

## 2013-02-07 LAB — CBC WITH DIFFERENTIAL/PLATELET
Basophils Absolute: 0 10*3/uL (ref 0.0–0.1)
Basophils Relative: 0 % (ref 0–1)
Eosinophils Absolute: 0.2 10*3/uL (ref 0.0–0.7)
Eosinophils Relative: 2 % (ref 0–5)
HCT: 40.6 % (ref 39.0–52.0)
Hemoglobin: 14.6 g/dL (ref 13.0–17.0)
Lymphocytes Relative: 33 % (ref 12–46)
Lymphs Abs: 2.8 10*3/uL (ref 0.7–4.0)
MCH: 35.5 pg — ABNORMAL HIGH (ref 26.0–34.0)
MCHC: 36 g/dL (ref 30.0–36.0)
MCV: 98.8 fL (ref 78.0–100.0)
Monocytes Absolute: 0.9 10*3/uL (ref 0.1–1.0)
Monocytes Relative: 10 % (ref 3–12)
Neutro Abs: 4.7 10*3/uL (ref 1.7–7.7)
Neutrophils Relative %: 55 % (ref 43–77)
Platelets: 225 10*3/uL (ref 150–400)
RBC: 4.11 MIL/uL — ABNORMAL LOW (ref 4.22–5.81)
RDW: 12.6 % (ref 11.5–15.5)
WBC: 8.6 10*3/uL (ref 4.0–10.5)

## 2013-02-07 MED ORDER — ENSURE PUDDING PO PUDG
1.0000 | Freq: Three times a day (TID) | ORAL | Status: DC
  Administered 2013-02-07 – 2013-02-17 (×30): 1 via ORAL

## 2013-02-07 NOTE — Progress Notes (Signed)
Patient ID: Rick Williams, male   DOB: 12-18-1965, 47 y.o.   MRN: [id number] Subjective/Complaints: 47 y.o. 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 Patient independent prior to admission working full-time. Admitted 01/30/2013 of left-sided weakness and altered mental status. Reports the patient had not been taking his blood pressure medications for 3 months. He was found on the floor by his wife. Blood pressure 190/110. 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 Patient was not a TPA candidate secondary to ICH. Patient was placed on the nicardipine drip for blood pressure control. Neurology services followup with conservative care. Followup cranial CT scan 01/31/2013 without change. Subcutaneous Lovenox initiated 02/01/2013 for DVT prophylaxis. NicoDerm patch added for history of tobacco abuse. Patient was maintained on ventilator support for a short time followed by critical care medicine. Blood pressure still with variables and remains on antihypertensive medication. Followup speech therapy for dysphagia and currently maintained on dysphagia 3 honey thick liquids  Slept ok  Review of Systems  HENT: Positive for neck pain.   Musculoskeletal: Positive for joint pain.       Left shoulder pain    Objective: Vital Signs: Blood pressure 150/99, pulse 55, temperature 98.1 F (36.7 C), temperature source Oral, resp. rate 18, height 5\' 10"  (1.778 m), weight 62.2 kg (137 lb 2 oz), SpO2 97.00%. No results found. Results for orders placed during the hospital encounter of 02/06/13 (from the past 72 hour(s))  CBC     Status: Abnormal   Collection Time    02/06/13  4:28 PM      Result Value Range   WBC 8.3  4.0 - 10.5 K/uL   RBC 4.29  4.22 - 5.81 MIL/uL   Hemoglobin 15.4  13.0 - 17.0 g/dL   HCT 40.9  81.1 - 91.4 %    MCV 99.8  78.0 - 100.0 fL   MCH 35.9 (*) 26.0 - 34.0 pg   MCHC 36.0  30.0 - 36.0 g/dL   RDW 78.2  95.6 - 21.3 %   Platelets 202  150 - 400 K/uL  CREATININE, SERUM     Status: None   Collection Time    02/06/13  4:28 PM      Result Value Range   Creatinine, Ser 0.85  0.50 - 1.35 mg/dL   GFR calc non Af Amer >90  >90 mL/min   GFR calc Af Amer >90  >90 mL/min   Comment:            The eGFR has been calculated     using the CKD EPI equation.     This calculation has not been     validated in all clinical     situations.     eGFR's persistently     <90 mL/min signify     possible Chronic Kidney Disease.  CBC WITH DIFFERENTIAL     Status: Abnormal   Collection Time    02/07/13  5:35 AM      Result Value Range   WBC 8.6  4.0 - 10.5 K/uL   RBC 4.11 (*) 4.22 - 5.81 MIL/uL   Hemoglobin 14.6  13.0 - 17.0 g/dL   HCT 08.6  57.8 - 46.9 %   MCV 98.8  78.0 - 100.0 fL   MCH 35.5 (*) 26.0 - 34.0 pg   MCHC 36.0  30.0 -  36.0 g/dL   RDW 16.1  09.6 - 04.5 %   Platelets 225  150 - 400 K/uL   Neutrophils Relative % 55  43 - 77 %   Lymphocytes Relative 33  12 - 46 %   Monocytes Relative 10  3 - 12 %   Eosinophils Relative 2  0 - 5 %   Basophils Relative 0  0 - 1 %   Neutro Abs 4.7  1.7 - 7.7 K/uL   Lymphs Abs 2.8  0.7 - 4.0 K/uL   Monocytes Absolute 0.9  0.1 - 1.0 K/uL   Eosinophils Absolute 0.2  0.0 - 0.7 K/uL   Basophils Absolute 0.0  0.0 - 0.1 K/uL   Smear Review MORPHOLOGY UNREMARKABLE    COMPREHENSIVE METABOLIC PANEL     Status: Abnormal   Collection Time    02/07/13  5:35 AM      Result Value Range   Sodium 133 (*) 135 - 145 mEq/L   Potassium 3.8  3.5 - 5.1 mEq/L   Chloride 101  96 - 112 mEq/L   CO2 21  19 - 32 mEq/L   Glucose, Bld 96  70 - 99 mg/dL   BUN 18  6 - 23 mg/dL   Creatinine, Ser 4.09  0.50 - 1.35 mg/dL   Calcium 9.0  8.4 - 81.1 mg/dL   Total Protein 8.2  6.0 - 8.3 g/dL   Albumin 3.2 (*) 3.5 - 5.2 g/dL   AST 42 (*) 0 - 37 U/L   ALT 30  0 - 53 U/L   Alkaline  Phosphatase 55  39 - 117 U/L   Total Bilirubin 0.4  0.3 - 1.2 mg/dL   GFR calc non Af Amer >90  >90 mL/min   GFR calc Af Amer >90  >90 mL/min   Comment:            The eGFR has been calculated     using the CKD EPI equation.     This calculation has not been     validated in all clinical     situations.     eGFR's persistently     <90 mL/min signify     possible Chronic Kidney Disease.     HEENT: normal and no facial droop Cardio: RRR Resp: CTA B/L and unlabored GI: BS positive and non tender Extremity:  Pulses positive and No Edema Skin:   Intact Neuro: Alert/Oriented, Confused, Cranial Nerve II-XII normal, Normal Sensory, Abnormal Motor 4/5 on Left side and Abnormal FMC Ataxic/ dec FMC Musc/Skel:  Normal Gen NAD   Assessment/Plan: 1. Functional deficits secondary to R ICH which require 3+ hours per day of interdisciplinary therapy in a comprehensive inpatient rehab setting. Physiatrist is providing close team supervision and 24 hour management of active medical problems listed below. Physiatrist and rehab team continue to assess barriers to discharge/monitor patient progress toward functional and medical goals. FIM:                   Comprehension Comprehension Mode: Auditory Comprehension: 3-Understands basic 50 - 74% of the time/requires cueing 25 - 50%  of the time  Expression Expression Mode: Verbal Expression: 3-Expresses basic 50 - 74% of the time/requires cueing 25 - 50% of the time. Needs to repeat parts of sentences.  Social Interaction Social Interaction: 3-Interacts appropriately 50 - 74% of the time - May be physically or verbally inappropriate.  Problem Solving Problem Solving: 3-Solves basic 50 - 74% of the time/requires  cueing 25 - 49% of the time  Memory Memory: 3-Recognizes or recalls 50 - 74% of the time/requires cueing 25 - 49% of the time   Medical Problem List and Plan:  1. 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 DVT Prophylaxis/Anticoagulation: Subcutaneous Lovenox initiated 02/01/2013. Monitor platelet counts any signs of bleeding  3. Pain Management/headaches. Tramadol 100 mg every 6 hours as needed. Monitor with increased activity  4. Neuropsych: This patient is not capable of making decisions on his/her own behalf.  5. Dysphagia. Dysphagia 3 honey thick liquids. Monitor for any signs of aspiration.  Continue IV fluids for hydration  6. Hypertension with poor medical compliance. Clonidine 0.2 mg twice a day. Monitor with increased activity. Provide counseling in regards to hypertension.  7. Tobacco/alcohol. NicoDerm patch. Provide counseling LOS (Days) 1 A FACE TO FACE EVALUATION WAS PERFORMED  KIRSTEINS,ANDREW E 02/07/2013, 7:11 AM

## 2013-02-07 NOTE — Evaluation (Signed)
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 ICH (intracerebral hemorrhage) 02/07/2013  . Essential hypertension, malignant 02/06/2013  . Encounter for long-term (current) use of high-risk medication 02/06/2013  . Hemorrhagic stroke 01/30/2013  . Acute respiratory failure due to AMS 01/30/2013  . Hypertension 01/30/2013  . IVH (intraventricular hemorrhage) 01/30/2013  . Intracranial hypertension 01/30/2013    Past Medical History:  Past Medical History  Diagnosis Date  . Hypertension   . Pneumonia   . Hemothorax on left   . Stroke 2007    no deficits   Past Surgical History: No past surgical history on file.  Assessment & Plan Clinical Impression:  Rick Williams is a 47 y.o. 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 Patient independent prior to admission working full-time. Admitted 01/30/2013 of left-sided weakness and altered mental status. Reports the patient had not been taking his blood pressure medications for 3 months. He was found on the floor by his wife. Blood pressure 190/110. 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 Patient was not a TPA candidate secondary to ICH. Patient was placed on the nicardipine drip for blood pressure control. Neurology services followup with conservative care. Followup cranial CT scan 01/31/2013 without change. Subcutaneous Lovenox initiated 02/01/2013 for DVT prophylaxis. NicoDerm patch  added for history of tobacco abuse. Patient was maintained on ventilator support for a short time followed by critical care medicine. Blood pressure still with variables and remains on antihypertensive medication. Followup speech therapy for dysphagia and currently maintained on dysphagia 3 honey thick liquids.Physical therapy evaluation completed 02/01/2013 as well as occupational therapy with noted decrease in balance and being impulsive. Recommendations made for physical medicine rehabilitation consult to consider inpatient rehabilitation services. Patient was felt to be a candidate for inpatient rehabilitation services and was admitted for a comprehensive rehabilitation program Patient transferred to CIR on 02/06/2013 .    Patient currently requires min with basic self-care skills secondary to muscle weakness, motor apraxia and decreased coordination, decreased attention to left, decreased safety awareness and decreased standing balance and hemiplegia.  Prior to hospitalization, patient was fully independent, working full time.   Patient will benefit from skilled intervention to increase independence with basic self-care skills and increase level of independence with iADL prior to discharge home with care partner.  Anticipate patient will require intermittent supervision and follow up outpatient.  OT - End of Session Activity Tolerance: Endurance does not limit participation in activity OT Assessment Rehab Potential: Excellent Barriers to Discharge: None OT Plan OT Intensity: Minimum of 1-2 x/day, 45 to 90 minutes OT Frequency: 5 out of 7 days OT Duration/Estimated Length of Stay: 10-12 days OT Treatment/Interventions: Balance/vestibular training;Cognitive remediation/compensation;Discharge planning;Neuromuscular re-education;Functional mobility training;DME/adaptive equipment instruction;Patient/family education;Self Care/advanced ADL retraining;Visual/perceptual remediation/compensation;UE/LE  Coordination activities;UE/LE Strength taining/ROM;Therapeutic Exercise;Therapeutic Activities OT Recommendation Patient destination: Home Follow Up Recommendations: Outpatient OT Equipment Recommended: Tub/shower seat   Skilled Therapeutic Intervention Pt seen for initial evaluation and ADL retraining to include shower and dressing with a focus on safety awareness, left side awareness,  forced use of left side.  Pt's wife present during the session for family education. During breakfast, pt needed assist with opening containers and mod verbal cues as he was taking large bites of food.   Pt was able to sit up to EOB independently and  ambulate into bathroom with steady assist at his hips without AD.  Pt needed steady assist in the shower as he would stand from shower bench impulsively without safe BOS.  He was able to bathe himself. He also needed steady assist with LB dressing for the same reason of standing impulsively.  In standing he is able to reach BUE over head and rotate his trunk without LOB but needs mod assist reaching to floor level as he places his feet close together and needs cues to stand with a wide base of support. Overall, pt only needed min physical assist but he was very impulsive during session demonstrating poor safety awareness. Reviewed fall precautions with pt and his wife, such as not to transfer without staff assist.  Pt left in chair at end of session with his wife in the room.   OT Evaluation Precautions/Restrictions  Precautions Precautions: Fall;Other (comment) Precaution Comments: left side inattention, impulsive Restrictions Weight Bearing Restrictions: No Pain Pain Assessment Pain Assessment: No/denies pain Home Living/Prior Functioning Home Living Lives With: Spouse Available Help at Discharge: Family;Available 24 hours/day Type of Home: House Home Access: Stairs to enter [employer] of Steps: 2-3 Entrance Stairs-Rails: None Home Layout: One  level Bathroom Shower/Tub: Forensic scientist: Standard Bathroom Accessibility: Yes How Accessible: Accessible via walker Home Adaptive Equipment: None Prior Function Level of Independence: Independent with basic ADLs;Independent with homemaking with ambulation;Independent with gait;Independent with transfers Able to Take Stairs?: Yes Vocation: Full time employment Vocation Requirements: Production designer, theatre/television/film of car detailing business Leisure: Hobbies-yes (Comment) Comments: enjoys long walks ADL  overall min to steady assist with basic self care Vision/Perception  Vision - History Baseline Vision: No visual deficits Patient Visual Report: No change from baseline Vision - Assessment Vision Assessment: Vision tested Ocular Range of Motion: Within Functional Limits Tracking/Visual Pursuits: Able to track stimulus in all quads without difficulty Saccades: Within functional limits Visual Fields: No apparent deficits Perception Perception: Impaired Inattention/Neglect: Does not attend to left side of body Praxis Praxis: Impaired Praxis Impairment Details: Motor planning Praxis-Other Comments: min impaired praxis of LUE  Cognition Behaviors: Impulsive Safety/Judgment: Impaired Comments: pt moves very quickly and is not fully aware of his left side positioning in standing and dynamic reaching tasks Sensation Sensation Light Touch: Appears Intact Stereognosis: Appears Intact Hot/Cold: Appears Intact Proprioception: Impaired by gross assessment Additional Comments: minimally impaired in LUE/LLE Coordination Gross Motor Movements are Fluid and Coordinated: No Fine Motor Movements are Fluid and Coordinated: No Coordination and Movement Description: impaired in LUE/LLE Finger Nose Finger Test: impaired in LUE, dysmetria, very slow to reach finger to nose Motor  Motor Motor: Hemiplegia;Motor apraxia Mobility    min assist with ambulation and transfers Trunk/Postural  Assessment  Cervical Assessment Cervical Assessment: Within Functional Limits Thoracic Assessment Thoracic Assessment: Within Functional Limits Lumbar Assessment Lumbar Assessment: Within Functional Limits Postural Control Postural Control: Within Functional Limits  Balance Dynamic Sitting Balance Dynamic Sitting - Level of Assistance: 7: Independent Dynamic Sitting - Balance Activities: Reaching across midline;Reaching for objects;Forward lean/weight shifting;Lateral lean/weight shifting Static Standing Balance Static Standing - Level of Assistance: 5: Stand by assistance Dynamic Standing Balance Dynamic Standing - Level of Assistance: 3: Mod assist Extremity/Trunk Assessment RUE Assessment RUE Assessment:  Within Functional Limits LUE AROM (degrees) Overall AROM Left Upper Extremity: Within functional limits for tasks assessed LUE Strength Left Shoulder Flexion: 4/5 Left Elbow Flexion: 4/5 Gross Grasp: Functional (functional strength, impaired coordination)  FIM:  FIM - Grooming Grooming Steps: Wash, rinse, dry face;Wash, rinse, dry hands;Oral care, brush teeth, clean dentures Grooming: 5: Set-up assist to apply toothpaste FIM - Bathing Bathing Steps Patient Completed: Chest;Right Arm;Left Arm;Abdomen;Front perineal area;Buttocks;Right upper leg;Left upper leg;Left lower leg (including foot);Right lower leg (including foot) Bathing: 4: Steadying assist FIM - Upper Body Dressing/Undressing Upper body dressing/undressing steps patient completed: Thread/unthread left sleeve of pullover shirt/dress;Put head through opening of pull over shirt/dress;Pull shirt over trunk;Thread/unthread right sleeve of pullover shirt/dresss Upper body dressing/undressing: 5: Supervision: Safety issues/verbal cues FIM - Lower Body Dressing/Undressing Lower body dressing/undressing steps patient completed: Thread/unthread right underwear leg;Thread/unthread left underwear leg;Pull underwear  up/down;Thread/unthread right pants leg;Thread/unthread left pants leg;Pull pants up/down;Don/Doff right sock;Don/Doff left sock;Don/Doff right shoe;Don/Doff left shoe Lower body dressing/undressing: 4: Steadying Assist FIM - Toileting Toileting: 0: Activity did not occur FIM - Bed/Chair Transfer Bed/Chair Transfer: 6: Supine > Sit: No assist;4: Bed > Chair or W/C: Min A (steadying Pt. > 75%);4: Chair or W/C > Bed: Min A (steadying Pt. > 75%) FIM - Archivist Transfers: 0-Activity did not occur FIM - Secretary/administrator Devices: Tub transfer bench;Walk in shower;Grab bars Tub/shower Transfers: 4-Into Tub/Shower: Min A (steadying Pt. > 75%/lift 1 leg);4-Out of Tub/Shower: Min A (steadying Pt. > 75%/lift 1 leg)   Refer to Care Plan for Long Term Goals  Recommendations for other services: None  Discharge Criteria: Patient will be discharged from OT if patient refuses treatment 3 consecutive times without medical reason, if treatment goals not met, if there is a change in medical status, if patient makes no progress towards goals or if patient is discharged from hospital.  The above assessment, treatment plan, treatment alternatives and goals were discussed and mutually agreed upon: by patient and by family  Lania Zawistowski 02/07/2013, 9:43 AM

## 2013-02-07 NOTE — Progress Notes (Signed)
INITIAL NUTRITION ASSESSMENT  DOCUMENTATION CODES Per approved criteria  -Not Applicable   INTERVENTION: 1. MVI daily 2. Ensure Pudding po QID, each supplement provides 170 kcal and 4 grams of protein - with meds. 3. RD to continue to follow nutrition care plan  NUTRITION DIAGNOSIS: Unintentional wt loss related to physical activity (per wife) as evidenced by 20% wt loss x 9 months.  Goal: Intake to meet >90% of estimated nutrition needs.  Monitor:  weight trends, lab trends, I/O's, PO intake, supplement tolerance  Reason for Assessment: Malnutrition Screening Tool  47 y.o. male  Admitting Dx: ICH (intracerebral hemorrhage)  ASSESSMENT: Pt with large right basal ganglia ICH with cytotoxic edema and intraventricular extension likely due to malignant HTN.  Pt was followed by RD staff during acute hospital. Pt received enteral nutrition during intubation. Pt advanced to diet on 5/19 - receiving Dysphagia 3 diet with honey thickened liquids.  Discussed oral intake with wife and SLP. Pt is currently eating very well, consuming 75 - 100% of meals. Enjoys Biochemist, clinical.   Wife notes that pt used to weight 170 - 175 lb a few months ago. She notes that pt is very active and walks every where he goes. Noted hx of ETOH abuse.  Pt with significant wt loss of 20% x 9 months. Wife states that pt eats well at baseline. Pt is at nutrition risk given hx of ETOH abuse.  Height: Ht Readings from Last 1 Encounters:  02/06/13 5\' 10"  (1.778 m)    Weight: Wt Readings from Last 1 Encounters:  02/06/13 137 lb 2 oz (62.2 kg)    Ideal Body Weight: 75.5 kg  % Ideal Body Weight: 82%  Wt Readings from Last 10 Encounters:  02/06/13 137 lb 2 oz (62.2 kg)  02/02/13 143 lb 1.3 oz (64.9 kg)  06/14/12 165 lb (74.844 kg)  05/15/12 172 lb (78.019 kg)    Usual Body Weight: 170 - 175 lb  % Usual Body Weight: 80%  BMI:  Body mass index is 19.68 kg/(m^2). WNL  Estimated Nutritional Needs: Kcal:  1800 - 2000 Protein: 80 - 95 grams Fluid: 1.8 - 2 liters  Skin: intact  Diet Order: Dysphagia 3; Honey Thickened Liquids  EDUCATION NEEDS: -No education needs identified at this time   Intake/Output Summary (Last 24 hours) at 02/07/13 1148 Last data filed at 02/07/13 0800  Gross per 24 hour  Intake    480 ml  Output    350 ml  Net    130 ml    Last BM: 5/19  Labs:   Recent Labs Lab 01/31/13 2145  02/01/13 0303  02/02/13 0515 02/04/13 0500 02/06/13 1628 02/07/13 0535  NA 153*  --  154*  < > 146* 141  --  133*  K  --   < > 3.5  --  3.0* 4.0  --  3.8  CL  --   < > 123*  --  111 107  --  101  CO2  --   < > 24  --  24 23  --  21  BUN  --   < > 19  --  9 13  --  18  CREATININE  --   < > 1.05  --  0.89 0.89 0.85 0.82  CALCIUM  --   < > 8.2*  --  9.0 9.0  --  9.0  MG  --   --  2.1  --  1.5  --   --   --  PHOS  --   --  2.4  --  2.9  --   --   --   GLUCOSE  --   < > 111*  --  112* 109*  --  96  < > = values in this interval not displayed.  CBG (last 3)   Recent Labs  02/05/13 1110  GLUCAP 113*    Scheduled Meds: . cloNIDine  0.2 mg Oral BID  . enoxaparin (LOVENOX) injection  40 mg Subcutaneous Q24H  . multivitamin  1 tablet Oral Daily  . nicotine  14 mg Transdermal Daily  . pantoprazole  40 mg Oral Q1200  . pneumococcal 23 valent vaccine  0.5 mL Intramuscular Tomorrow-1000  . senna-docusate  1 tablet Oral BID  . thiamine  100 mg Oral Daily    Continuous Infusions: . sodium chloride 50 mL/hr at 02/07/13 1610    Past Medical History  Diagnosis Date  . Hypertension   . Pneumonia   . Hemothorax on left   . Stroke 2007    no deficits    No past surgical history on file.  Jarold Motto MS, RD, LDN Pager: [phone] After-hours pager: [phone]

## 2013-02-07 NOTE — Evaluation (Signed)
Speech Language Pathology Assessment and Plan  Patient Details  Name: Rick Williams MRN: [id number] Date of Birth: Jun 20, 1966  SLP Diagnosis: Dysphagia;Cognitive Impairments  Rehab Potential: Excellent ELOS: 10-12 days   Today's Date: 02/07/2013 Time: 4098-1191 Time Calculation (min): 60 min  Skilled Therapeutic Intervention: Administered Cognitive-linguistic evaluation, please see below for details. Also administered a limited BSE due to pt consuming his breakfast meal of Dys. 3 textures with honey-thick liquids without overt s/s of aspiration. Pt's wife present and provided appropriate cueing throughout the meal and is safe to provide full supervision with meals. Both pt and his wife educated on current cognitive and swallowing impairments and goals of skilled SLP intervention. Both verbalized understanding but pt will need reinforcement.   Problem List:  Patient Active Problem List   Diagnosis Date Noted  . ICH (intracerebral hemorrhage) 02/07/2013  . Essential hypertension, malignant 02/06/2013  . Encounter for long-term (current) use of high-risk medication 02/06/2013  . Hemorrhagic stroke 01/30/2013  . Acute respiratory failure due to AMS 01/30/2013  . Hypertension 01/30/2013  . IVH (intraventricular hemorrhage) 01/30/2013  . Intracranial hypertension 01/30/2013   Past Medical History:  Past Medical History  Diagnosis Date  . Hypertension   . Pneumonia   . Hemothorax on left   . Stroke 2007    no deficits   Past Surgical History: No past surgical history on file.  Assessment / Plan / Recommendation Clinical Impression  Pt is a 47 y.o. 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 Patient independent prior to admission working full-time. Admitted 01/30/2013 of left-sided weakness and altered mental status. Reports the patient had not been taking his blood pressure medications for 3 months. He  was found on the floor by his wife. Blood pressure 190/110. 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 Patient was not a TPA candidate secondary to ICH. Patient was placed on the nicardipine drip for blood pressure control. Neurology services follow-up with conservative care. Follow-up cranial CT scan 01/31/2013 without change. Subcutaneous Lovenox initiated 02/01/2013 for DVT prophylaxis. NicoDerm patch added for history of tobacco abuse. Patient was maintained on ventilator support for a short time followed by critical care medicine. Blood pressure still with variables and remains on antihypertensive medication. Follow-up speech therapy for dysphagia and currently maintained on dysphagia 3 textures with honey thick liquids. Physical therapy evaluation completed 02/01/2013 as well as occupational therapy with noted decrease in balance and being impulsive. Recommendations made for physical medicine rehabilitation consult to consider inpatient rehabilitation services. Patient was felt to be a candidate for inpatient rehabilitation services and was admitted for a comprehensive rehabilitation program Patient transferred to CIR on 02/06/2013 and presents with a moderate pharyngeal dysphagia per MBS completed 02/02/13 and is currently consuming Dys. 3 textures with honey-thick liquids without overt s/s of aspiration and requires moderate verbal cues for utilization of swallowing compensatory strategies. Pt also demonstrates moderate cognitive impairments characterized by left inattention, decreased problem solving, decreased safety awareness, decreased insight and judgment, decreased intellectual awareness of physical and cognitive deficits and impulsivity impacting pt's overall safety and independence with functional tasks. Patient will benefit from skilled SLP intervention to maximize cognitive recovery and  swallowing function with least restrictive diet and to increase his overall functional independence. Anticipate pt will need 24 hour supervision and follow-up outpatient services.     SLP Assessment  Patient  will need skilled Speech Lanaguage Pathology Services during CIR admission    Recommendations  Recommended Consults: MBS Diet Recommendations: Dysphagia 3 (Mechanical Soft);Honey-thick liquid Liquid Administration via: Cup Medication Administration: Crushed with puree Supervision: Patient able to self feed;Full supervision/cueing for compensatory strategies Compensations: Slow rate;Small sips/bites;Clear throat intermittently Postural Changes and/or Swallow Maneuvers: Seated upright 90 degrees Oral Care Recommendations: Oral care BID Patient destination: Home Follow up Recommendations: Outpatient SLP;24 hour supervision/assistance Equipment Recommended: None recommended by SLP    SLP Frequency 5 out of 7 days   SLP Treatment/Interventions Cognitive remediation/compensation;Cueing hierarchy;Dysphagia/aspiration precaution training;Functional tasks;Environmental controls;Internal/external aids;Therapeutic Activities;Patient/family education    Pain Pain Assessment Pain Assessment: No/denies pain Prior Functioning Type of Home: House Lives With: Spouse Available Help at Discharge: Family;Available 24 hours/day Vocation: Full time employment  Short Term Goals: Week 1: SLP Short Term Goal 1 (Week 1): Pt will utilize swallowing compensatory strategies with Min verbal cues to minimize overt s/s of aspiration.  SLP Short Term Goal 2 (Week 1): Pt will consume trials of thin liquids without overt s/s of aspiration with 50% of trials with Min verbal cues.  SLP Short Term Goal 3 (Week 1): Pt will utilize call bell to express wants/needs with Mod verbal and question cues.  SLP Short Term Goal 4 (Week 1): Pt will identify 2 physical and 2 cognitive deficits with Mod contextual, visual and  question cues.  SLP Short Term Goal 5 (Week 1): Pt will demonstrate functional problem solving for basic and familair taks with Mod A verbal and question cues.  SLP Short Term Goal 6 (Week 1): Pt will attend to LUE during functional tasks with Mod A verbal and question cues.   See FIM for current functional status Refer to Care Plan for Long Term Goals  Recommendations for other services: Neuropsych  Discharge Criteria: Patient will be discharged from SLP if patient refuses treatment 3 consecutive times without medical reason, if treatment goals not met, if there is a change in medical status, if patient makes no progress towards goals or if patient is discharged from hospital.  The above assessment, treatment plan, treatment alternatives and goals were discussed and mutually agreed upon: by patient  Larrissa Stivers 02/07/2013, 10:51 AM

## 2013-02-07 NOTE — Progress Notes (Signed)
Occupational Therapy Session Note  Patient Details  Name: Rick Williams MRN: [id number] Date of Birth: 1966-08-09  Today's Date: 02/07/2013 Time: 1300-1330 Time Calculation (min): 30 min  Skilled Therapeutic Interventions/Progress Updates:    In session worked on toileting, grooming in standing and functional mobility.  Pt initially stood at the toilet to urinate with close supervision for static standing.  He needed min facilitation to tie his pants secondary to not being able to tie them.  When asked if he was having difficulty throughout session using the LUE he stated "no".  While standing to work on shaving pt multiple times needed assistance with supporting the LUE when reaching for shaving cream or attempting to replace top but still demonstrated poor awareness of LUE weakness.  Maintained overall right gaze preference as well.  As he lifted his head to shave he demonstrated multiple losses of balance to the left and backwards requiring mod assist to selfcorrect.  Pt did state that he noticed that he needs assistance for balance.  During mobility in the hall pt frequently running into doorways and objects on the left side.  Needs max instructional cueing to scan to the left to avoid these obstacles.  Pt able to demonstrate emergent awareness pertaining to his vision and loss of balance to the left but none related to his LUE function.  Therapy Documentation Precautions:  Precautions Precautions: Fall;Other (comment) Precaution Comments: left side inattention; impulsive Restrictions Weight Bearing Restrictions: No  Pain: Pain Assessment Pain Assessment: No/denies pain Pain Score: 0-No pain Pain Type: Acute pain Pain Location: Head Pain Orientation: Posterior Pain Descriptors: Headache Pain Frequency: Intermittent Pain Onset: Gradual Patients Stated Pain Goal: 2 Pain Intervention(s): Medication (See eMAR) Multiple Pain Sites: No ADL: See FIM for current functional  status  Therapy/Group: Individual Therapy  Yoshio Seliga OTR/L 02/07/2013, 3:32 PM

## 2013-02-07 NOTE — Evaluation (Signed)
Physical Therapy Assessment and Plan  Patient Details  Name: Rick Williams MRN: [id number] Date of Birth: August 10, 1966  PT Diagnosis: Abnormality of gait and Hemiparesis non-dominant Rehab Potential: Excellent ELOS: 10 - 12 days   Today's Date: 02/07/2013 Time: 1330-1430 Time Calculation (min): 60 min  Problem List:  Patient Active Problem List   Diagnosis Date Noted  . ICH (intracerebral hemorrhage) 02/07/2013  . Essential hypertension, malignant 02/06/2013  . Encounter for long-term (current) use of high-risk medication 02/06/2013  . Hemorrhagic stroke 01/30/2013  . Acute respiratory failure due to AMS 01/30/2013  . Hypertension 01/30/2013  . IVH (intraventricular hemorrhage) 01/30/2013  . Intracranial hypertension 01/30/2013    Past Medical History:  Past Medical History  Diagnosis Date  . Hypertension   . Pneumonia   . Hemothorax on left   . Stroke 2007    no deficits   Past Surgical History: No past surgical history on file.  Assessment & Plan Clinical Impression: Rick Williams is a 47 y.o. 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 Patient independent prior to admission working full-time. Admitted 01/30/2013 of left-sided weakness and altered mental status. Reports the patient had not been taking his blood pressure medications for 3 months. He was found on the floor by his wife. Blood pressure 190/110. 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 Patient was not a TPA candidate secondary to ICH. Patient was placed on the nicardipine drip for blood pressure control. Neurology services followup with conservative care. Followup cranial CT scan 01/31/2013 without change. Subcutaneous Lovenox initiated 02/01/2013 for DVT prophylaxis. NicoDerm patch added for history of tobacco abuse.  Patient was maintained on ventilator support for a short time followed by critical care medicine. Blood pressure still with variables and remains on antihypertensive medication. Followup speech therapy for dysphagia and currently maintained on dysphagia 3 honey thick liquids.Physical therapy evaluation completed 02/01/2013 as well as occupational therapy with noted decrease in balance and being impulsive. Recommendations made for physical medicine rehabilitation consult to consider inpatient rehabilitation services. Patient was felt to be a candidate for inpatient rehabilitation services and was admitted on 02/06/2013 for a comprehensive rehabilitation program.   Patient currently requires min with mobility secondary to decreased coordination and left inattention.  Prior to hospitalization, patient was independent  with mobility and lived with Spouse in a House home.  Home access is 2Stairs to enter.  Patient will benefit from skilled PT intervention to maximize safe functional mobility, minimize fall risk and decrease caregiver burden for planned discharge home with 24 hour supervision.  Anticipate patient will benefit from follow up OP at discharge.  PT - End of Session Activity Tolerance: Tolerates 30+ min activity without fatigue PT Assessment Rehab Potential: Excellent Barriers to Discharge: None PT Plan PT Intensity: Minimum of 1-2 x/day ,45 to 90 minutes PT Frequency: 5 out of 7 days PT Duration Estimated Length of Stay: 10 - 12 days PT Treatment/Interventions: Ambulation/gait training;Balance/vestibular training;Community reintegration;Discharge planning;Functional mobility training;Patient/family education;Therapeutic Exercise;Therapeutic Activities;Stair training;UE/LE Strength taining/ROM;UE/LE Coordination activities PT Recommendation Follow Up Recommendations: 24 hour supervision/assistance;Outpatient PT Patient destination: Home Equipment Recommended: Other (comment) (To be  determined)  Skilled Therapeutic Intervention  Patient resting in bed upon entering room. Patient participated in initial evaluation. Patient ambulated without assistive device 150+ feet with min steady assist except for mod assist for 1 LOB to left. Patient  participated in BERG and DGI balance assessments - see results below. Patient exercised on Nustep x 7 minutes on work load 5 with BORG exertion at 11. Patient presents with left inattention and decreased awareness of deficits. Patient returned to room and left in bed with items in reach; wife and visitors in room.  PT Evaluation Precautions/Restrictions Precautions Precautions: Fall;Other (comment) Precaution Comments: left side inattention; impulsive Restrictions Weight Bearing Restrictions: No General Chart Reviewed: Yes Family/Caregiver Present: Yes Vital SignsTherapy Vitals Temp: 98.4 F (36.9 C) Temp src: Oral Pulse Rate: 55 Resp: 18 BP: 143/80 mmHg Patient Position, if appropriate: Lying Oxygen Therapy SpO2: 96 % O2 Device: None (Room air) Pain Pain Assessment Pain Assessment: No/denies pain Pain Score: 0-No pain Pain Type: Acute pain Pain Location: Head Pain Orientation: Posterior Pain Descriptors: Headache Pain Frequency: Intermittent Pain Onset: Gradual Patients Stated Pain Goal: 2 Pain Intervention(s): Medication (See eMAR) Multiple Pain Sites: No Home Living/Prior Functioning Home Living Lives With: Spouse Available Help at Discharge: Family;Available 24 hours/day Type of Home: House Home Access: Stairs to enter [employer] of Steps: 2 Entrance Stairs-Rails: Right Home Layout: One level Prior Function Level of Independence: Independent with gait Able to Take Stairs?: Yes Driving: No Vocation: Full time employment Vision/Perception     Cognition Overall Cognitive Status: Impaired/Different from baseline Arousal/Alertness: Awake/alert Orientation Level: Oriented X4;Oriented to  person;Oriented to place Sensation Sensation Light Touch: Appears Intact Stereognosis: Appears Intact Hot/Cold: Not tested Proprioception: Appears Intact Coordination Gross Motor Movements are Fluid and Coordinated: No Fine Motor Movements are Fluid and Coordinated: No Finger Nose Finger Test: impaired in LUE, dysmetria, very slow to reach finger to nose Heel Shin Test: patient able to perform bilaterally; slightly slower on left Motor  Motor Motor: Hemiplegia;Motor apraxia  Mobility Bed Mobility Supine to Sit: 5: Supervision;HOB elevated;With rails Sitting - Scoot to Edge of Bed: 5: Supervision Sit to Supine: 5: Supervision Transfers Sit to Stand: 4: Min guard;With upper extremity assist;From bed Stand to Sit: 4: Min guard;With upper extremity assist;To bed Locomotion  Ambulation Ambulation/Gait Assistance: 4: Min assist Ambulation Distance (Feet): 150 Feet Assistive device: None Gait Gait: Yes Gait Pattern: Step-through pattern;Lateral trunk lean to right;Narrow base of support;Decreased weight shift to right High Level Ambulation High Level Ambulation: Side stepping;Backwards walking;Direction changes;Sudden stops;Head turns Side Stepping: min assist to side step; tends to fall to left Backwards Walking: decreased speed; loses balance to left Direction Changes: supervision with direction changes Sudden Stops: supervision with sudden stops Head Turns: tends to list to left with head turns; LOB to left Stairs / Additional Locomotion Stairs: Yes Stairs Assistance: 3: Mod assist Stairs Assistance Details: Verbal cues for sequencing;Manual facilitation for placement Stair Management Technique: One rail Right Number of Stairs: 10 Height of Stairs: 4    Balance Balance Balance Assessed: Yes Standardized Balance Assessment Standardized Balance Assessment: Berg Balance Test;Dynamic Gait Index Berg Balance Test Sit to Stand: Able to stand without using hands and stabilize  independently Standing Unsupported: Able to stand safely 2 minutes Sitting with Back Unsupported but Feet Supported on Floor or Stool: Able to sit safely and securely 2 minutes Stand to Sit: Sits safely with minimal use of hands Transfers: Able to transfer safely, minor use of hands Standing Unsupported with Eyes Closed: Able to stand 10 seconds safely Standing Ubsupported with Feet Together: Able to place feet together independently but unable to hold for 30 seconds From Standing, Reach Forward with Outstretched Arm: Can reach confidently >25 cm (10") From Standing Position, Pick  up Object from Floor: Able to pick up shoe safely and easily From Standing Position, Turn to Look Behind Over each Shoulder: Looks behind from both sides and weight shifts well Turn 360 Degrees: Able to turn 360 degrees safely one side only in 4 seconds or less Standing Unsupported, Alternately Place Feet on Step/Stool: Able to stand independently and safely and complete 8 steps in 20 seconds Standing Unsupported, One Foot in Front: Needs help to step but can hold 15 seconds Standing on One Leg: Able to lift leg independently and hold 5-10 seconds Total Score: 49 Dynamic Gait Index Level Surface: Moderate Impairment Change in Gait Speed: Mild Impairment Gait with Horizontal Head Turns: Moderate Impairment Gait with Vertical Head Turns: Moderate Impairment Gait and Pivot Turn: Normal Step Over Obstacle: Mild Impairment Step Around Obstacles: Severe Impairment Steps: Mild Impairment Total Score: 12 Extremity Assessment  RUE Assessment RUE Assessment: Within Functional Limits LUE AROM (degrees) Overall AROM Left Upper Extremity: Within functional limits for tasks assessed RLE Assessment RLE Assessment: Within Functional Limits LLE Assessment LLE Assessment: Within Functional Limits  FIM:  FIM - Bed/Chair Transfer Bed/Chair Transfer Assistive Devices: Bed rails;HOB elevated Bed/Chair Transfer: 5: Supine >  Sit: Supervision (verbal cues/safety issues);5: Sit > Supine: Supervision (verbal cues/safety issues);4: Bed > Chair or W/C: Min A (steadying Pt. > 75%);4: Chair or W/C > Bed: Min A (steadying Pt. > 75%) FIM - Locomotion: Wheelchair Locomotion: Wheelchair: 0: Activity did not occur FIM - Locomotion: Ambulation Ambulation/Gait Assistance: 4: Min assist Locomotion: Ambulation: 4: Travels 150 ft or more with minimal assistance (Pt.>75%) FIM - Locomotion: Stairs Locomotion: Building control surveyor: Hand rail - 1 Locomotion: Stairs: 2: Up and Down 4 - 11 stairs with moderate assistance (Pt: 50 - 74%)   Refer to Care Plan for Long Term Goals  Recommendations for other services: None  Discharge Criteria: Patient will be discharged from PT if patient refuses treatment 3 consecutive times without medical reason, if treatment goals not met, if there is a change in medical status, if patient makes no progress towards goals or if patient is discharged from hospital.  The above assessment, treatment plan, treatment alternatives and goals were discussed and mutually agreed upon: by patient and by family  Alma Friendly 02/07/2013, 3:04 PM

## 2013-02-07 NOTE — Care Management Note (Signed)
[facility] Individual Statement of Services  Patient Name:  Rick Williams  Date:  02/07/2013  Welcome to the [facility].  Our goal is to provide you with an individualized program based on your diagnosis and situation, designed to meet your specific needs.  With this comprehensive rehabilitation program, you will be expected to participate in at least 3 hours of rehabilitation therapies Monday-Friday, with modified therapy programming on the weekends.  Your rehabilitation program will include the following services:  Physical Therapy (PT), Occupational Therapy (OT), Speech Therapy (ST), 24 hour per day rehabilitation nursing, Neuropsychology, Case Management ( Social Worker), Rehabilitation Medicine, Nutrition Services and Pharmacy Services  Weekly team conferences will be held on Wednesday to discuss your progress.  Your Social Worker will talk with you frequently to get your input and to update you on team discussions.  Team conferences with you and your family in attendance may also be held.  Expected length of stay: 10-12 days Overall anticipated outcome: supervision with cues  Depending on your progress and recovery, your program may change. Your Social Worker will coordinate services and will keep you informed of any changes. Your Child psychotherapist names and contact numbers are listed  below.  The following services may also be recommended but are not provided by the [facility]:   Driving Evaluations  Home Health Rehabiltiation Services  Outpatient Rehabilitatation [facility]  Vocational Rehabilitation   Arrangements will be made to provide these services after discharge if needed.  Arrangements include referral to agencies that provide these services.  Your insurance has been verified to be:  Obamacare-to pay premium begin 6/1? Your primary doctor is:  None  Pertinent information will be shared with your doctor and  your insurance company.  Social Worker:  Dossie Der, [place] [phone]  Information discussed with and copy given to patient by: Lucy Chris, 02/07/2013, 10:00 AM

## 2013-02-08 ENCOUNTER — Inpatient Hospital Stay (HOSPITAL_COMMUNITY): Payer: MEDICAID | Admitting: Occupational Therapy

## 2013-02-08 ENCOUNTER — Inpatient Hospital Stay (HOSPITAL_COMMUNITY): Payer: Self-pay | Admitting: *Deleted

## 2013-02-08 ENCOUNTER — Inpatient Hospital Stay (HOSPITAL_COMMUNITY): Payer: MEDICAID

## 2013-02-08 ENCOUNTER — Inpatient Hospital Stay (HOSPITAL_COMMUNITY): Payer: MEDICAID | Admitting: *Deleted

## 2013-02-08 ENCOUNTER — Inpatient Hospital Stay (HOSPITAL_COMMUNITY): Payer: MEDICAID | Admitting: Speech Pathology

## 2013-02-08 DIAGNOSIS — I619 Nontraumatic intracerebral hemorrhage, unspecified: Secondary | ICD-10-CM

## 2013-02-08 DIAGNOSIS — I10 Essential (primary) hypertension: Secondary | ICD-10-CM

## 2013-02-08 LAB — URINALYSIS, ROUTINE W REFLEX MICROSCOPIC
Bilirubin Urine: NEGATIVE
Glucose, UA: NEGATIVE mg/dL
Hgb urine dipstick: NEGATIVE
Ketones, ur: NEGATIVE mg/dL
Leukocytes, UA: NEGATIVE
Nitrite: NEGATIVE
Protein, ur: 30 mg/dL — AB
Specific Gravity, Urine: 1.022 (ref 1.005–1.030)
Urobilinogen, UA: 0.2 mg/dL (ref 0.0–1.0)
pH: 5.5 (ref 5.0–8.0)

## 2013-02-08 LAB — URINE MICROSCOPIC-ADD ON

## 2013-02-08 MED ORDER — TRAMADOL HCL 50 MG PO TABS
50.0000 mg | ORAL_TABLET | Freq: Four times a day (QID) | ORAL | Status: DC | PRN
  Administered 2013-02-08 – 2013-02-15 (×15): 50 mg via ORAL
  Filled 2013-02-08 (×17): qty 1

## 2013-02-08 MED ORDER — SODIUM CHLORIDE 0.9 % IV SOLN
INTRAVENOUS | Status: DC
  Administered 2013-02-08 – 2013-02-13 (×6): via INTRAVENOUS

## 2013-02-08 MED ORDER — LIDOCAINE HCL 2 % EX GEL
CUTANEOUS | Status: DC | PRN
  Filled 2013-02-08: qty 5

## 2013-02-08 NOTE — Plan of Care (Signed)
Problem: RH BOWEL ELIMINATION Goal: RH STG MANAGE BOWEL WITH ASSISTANCE STG Manage Bowel with mod independence  Outcome: Progressing No incontinent episode reported.

## 2013-02-08 NOTE — Plan of Care (Signed)
Problem: RH COGNITION-NURSING Goal: RH STG USES MEMORY AIDS/STRATEGIES W/ASSIST TO PROBLEM SOLVE STG Uses Memory Aids/Strategies With min Assistance to Problem Solve.  Outcome: Not Progressing Increased confusion noted compared to base line, per report

## 2013-02-08 NOTE — Plan of Care (Signed)
Problem: RH SAFETY Goal: RH STG ADHERE TO SAFETY PRECAUTIONS W/ASSISTANCE/DEVICE STG Adhere to Safety Precautions With supervision  Outcome: Not Progressing Can be combative and forgetful at times.

## 2013-02-08 NOTE — Progress Notes (Signed)
Patient blood pressure checked and found to be 149/102, patient was lying in bed with no signs of distress. Scheduled blood pressure med given and blood pressure recheck and found to be 145/100. Pam Love PA notified orders given to recheck blood pressure every hour until blood pressure come below 100. Patient complained of pain and prn pain med was given. Blood pressure rechecked and found to be 186/106 patient has just gotten up to sink and taken a bath recheck blood pressure manually  Found to be 182/102. Blood pressure recheck after the patient was allowed to settle down and found to be 136/65.

## 2013-02-08 NOTE — Progress Notes (Addendum)
Physical Therapy Session Note  Patient Details  Name: Rick Williams MRN: [id number] Date of Birth: 1965/12/30  Today's Date: 02/08/2013 Time: 1000-1100 Time Calculation (min): 60 min  Short Term Goals: Week 1:  PT Short Term Goal 1 (Week 1): STG's = LTG's due to LOS  Skilled Therapeutic Interventions/Progress Updates:   Pt was asleep and difficult to arouse.  Therapist consulted with RN who stated that pt had been alert and requesting cranberry juice a few minutes ago.  Therapist approached pt again, suggesting he have cranberry juice.  Pt sat up with supervision.  Refused need to use toilet.  After pt sat up for a couple of minutes, therapist supervised pt as he drank small sips of juice.  As he finished the juice, pt coughed.  Therapist noted that pt is supposed to have honey-thick liquids.  Therapist informed SLP, who stated that she was intending to test pt with thin liquids today.  Treatment focused on neuromuscular re-education via manual cues, VCs, demo for LLE swing phase and stance phase components, L awareness, trunk activation.   Gait x 200' without AD with close supervision/min guard, mod cues to attend to L.  Pt tends to look to the R at all times unless cued.  Velocity is fast, with L foot scuffing floor > 50% of time.  Pt does not slow down with VCS.  Up/down 5 steps with 2 rails, min guard assist, step through when ascending, step to when descending, cues to place L foot fully on step. Sidestepping, retrieving items from floor, soccer drills in standing, targeted foot taps, heel slides, retrieving items with R hand in squatting position.  Pt has good strength RUE, but poor muscular endurance and awareness; performance improves with visual feedback.    Therapy Documentation Precautions:  Precautions Precautions: Fall;Other (comment) Precaution Comments: left side inattention; impulsive Restrictions Weight Bearing Restrictions: No   Vital Signs: Therapy Vitals Pulse  Rate: 57 BP: 157/97 mmHg Patient Position, if appropriate: Sitting (after gait x 150') Pain: Pain Assessment Pain Assessment: No/denies pain     Other Treatments: Treatments Neuromuscular Facilitation: Left;upper and Lower Extremity;Upper Extremity;Forced use;Activity to increase coordination;Activity to increase timing and sequencing;Activity to increase motor control;Activity to increase grading;Activity to increase sustained activation;Activity to increase anterior-posterior weight shifting  See FIM for current functional status  Therapy/Group: Individual Therapy  Mckynlee Luse 02/08/2013, 12:32 PM

## 2013-02-08 NOTE — Plan of Care (Signed)
Problem: RH SKIN INTEGRITY Goal: RH STG SKIN FREE OF INFECTION/BREAKDOWN Outcome: Progressing Skin, clean dry intact

## 2013-02-08 NOTE — Progress Notes (Signed)
Social Work Assessment and Plan Social Work Assessment and Plan  Patient Details  Name: Rick Williams MRN: [id number] Date of Birth: 13-Oct-1965  Today's Date: 02/08/2013  Problem List:  Patient Active Problem List   Diagnosis Date Noted  . ICH (intracerebral hemorrhage) 02/07/2013  . Essential hypertension, malignant 02/06/2013  . Encounter for long-term (current) use of high-risk medication 02/06/2013  . Hemorrhagic stroke 01/30/2013  . Acute respiratory failure due to AMS 01/30/2013  . Hypertension 01/30/2013  . IVH (intraventricular hemorrhage) 01/30/2013  . Intracranial hypertension 01/30/2013   Past Medical History:  Past Medical History  Diagnosis Date  . Hypertension   . Pneumonia   . Hemothorax on left   . Stroke 2007    no deficits   Past Surgical History: No past surgical history on file. Social History:  reports that he has been smoking Cigarettes.  He has been smoking about 1.00 pack per day. He does not have any smokeless tobacco history on file. He reports that  drinks alcohol. He reports that he does not use illicit drugs.  Family / Support Systems Marital Status: Married Patient Roles: Spouse;Parent;Other (Comment) (Employee) Spouse/Significant Other: Clydie Braun  [phone] currently has lost Children: Erica-dauaghter  [phone] Other Supports: Pt's other children and wife's children Anticipated Caregiver: Wife and daughter's Ability/Limitations of Caregiver: Wife currently is not working and is avaialable to provide care to pt Caregiver Availability: 24/7 Family Dynamics: Close knit blended family between them they have six children.  Wife reports: " We all get along and will do what Alinda Money needs."  Social History Preferred language: English Religion: None Cultural Background: No issues Education: High School Read: Yes Write: Yes Employment Status: Employed Name of Employer: Forensic scientist Return to Work Plans: Unsure at this time Insurance account manager Issues: No issues Guardian/Conservator: None-according to MD pt is capable of making his own decisions, wife would like to be included   Abuse/Neglect Physical Abuse: Denies Verbal Abuse: Denies Sexual Abuse: Denies Exploitation of patient/patient's resources: Denies Self-Neglect: Denies  Emotional Status Pt's affect, behavior adn adjustment status: Pt is exhausted from therapies and sleeping off and on.  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 Recent Psychosocial Issues: Other medical issues-not taking HTN meds due to made feel bad hasn't for 9 months Pyschiatric History: No history-deferred depression screen due to fatigue and exhaustion.  May benefit from Neuro-psych to assist with coping, since young and so independent PTA Substance Abuse History: ETOH-tow 40 oz beers a night and also smoked cigarettes.  Wife feels not a big issue but aware he needs to quit.  Patient / Family Perceptions, Expectations & Goals Pt/Family understanding of illness & functional limitations: Wife can explain his stroek and deficits, pt also aware of his deficits.  He really wants to improve and do well here.  Both aware of the importance of taking meds to control blood pressure.  Wife is a Comptroller for people and has helped other stroke pt's. Premorbid pt/family roles/activities: Husband, Father, Employee, Son, etc Anticipated changes in roles/activities/participation: resume Pt/family expectations/goals: Pt states: " I go to get better and go back to work."  Wife states; ' I hope he will do well and regain his independence."  [employer]: None Premorbid Home Care/DME Agencies: None Transportation available at discharge: [employer] referrals recommended: Support group (specify) ([facility] Support group)  Discharge  Planning Living Arrangements:  Spouse/significant other Support Systems: Spouse/significant other;Children;Parent;Other relatives;Friends/neighbors;Church/faith community Type of Residence: Private residence Insurance Resources: Self-pay;Medicaid (specify county) (Will apply for [employer]) Financial Resources: Employment Financial Screen Referred: Yes Living Expenses: Rent Money Management: Patient;Spouse Do you have any problems obtaining your medications?: Yes (Describe) (No insurance-didn't like the side effects of HTN meds) Home Management: Wife Patient/Family Preliminary Plans: Plan to go to [employer] -daughter's home upon discharge with wife being the caregiver and other children also.  May be giving up apt so one less expense.  Wife aware to apply for SSD and Medicaid.  She wants to be pt's paided caregiver once Medicaid approved.  Aware this will take several months. Social Work Anticipated Follow Up Needs: HH/OP;Support Group  Clinical Impression Pleasant gentleman who is sleeping off and on-wife present is supportive and will do what she needs to to assist and apply for various community resources. Wife aware of the time it takes to approve SSD and Medicaid contingent upon disability approval.  Informed pt would require 24 hr supervision upon discharge from here.  Lucy Chris 02/08/2013, 9:31 AM

## 2013-02-08 NOTE — Progress Notes (Addendum)
Physical Therapy Session Note  Patient Details  Name: Rick Williams MRN: [id number] Date of Birth: [birth year]  Today's Date: 02/08/2013 Time: 1410-1450 Time Calculation (min): 40 min  Short Term Goals: Week 1:  PT Short Term Goal 1 (Week 1): STG's = LTG's due to LOS  Skilled Therapeutic Interventions/Progress Updates:    Patient received from RN standing at toilet. Patient had gotten up out of bed, without calling for assistance, bed alarm sounding. While ambulating to the bathroom, RN present in room to assist patient. Patient with incontinence of bladder. Vitals taken secondary to patient profusely sweating and noted confusion (patient reports he "didn't get his breakfast tray"), see below. Re-oriented patient to time, situation. Room temp set at 85, turned down to 74. Session focused on sit<>stand transfers, doffing of shorts and socks, donning of clean pants and socks. Patient demonstrating inattention to L side, especially L arm. L arm hitting objects in room and patient unaware. Patient left seated at nurse's station with seatbelt donned.  Therapy Documentation Precautions:  Precautions Precautions: Fall;Other (comment) Precaution Comments: left side inattention; impulsive Restrictions Weight Bearing Restrictions: No Vital Signs: Therapy Vitals Temp: 98 F (36.7 C) Temp src: Oral Pulse Rate: 69 Resp: 18 BP: 133/88 mmHg Patient Position, if appropriate: Sitting Oxygen Therapy SpO2: 96 % O2 Device: None (Room air) Pain: Pain Assessment Pain Assessment: No/denies pain  See FIM for current functional status  Therapy/Group: Individual Therapy  Chipper Herb. Andromeda Poppen, PT, DPT  02/08/2013, 4:27 PM

## 2013-02-08 NOTE — Plan of Care (Signed)
Problem: RH KNOWLEDGE DEFICIT Goal: RH STG INCREASE KNOWLEDGE OF DYSPHAGIA/FLUID INTAKE Educate pt and family on importance of fluid intake, s/s dehydration, especially while on thickened liquids  Outcome: Not Progressing Pt not following dysphagia protocol without reminders

## 2013-02-08 NOTE — Plan of Care (Signed)
Problem: RH PAIN MANAGEMENT Goal: RH STG PAIN MANAGED AT OR BELOW PT'S PAIN GOAL 3 or less 1-10 scale, managed with prn medications  Outcome: Not Progressing C/o of headache

## 2013-02-08 NOTE — Progress Notes (Signed)
Patient ID: Rick Williams, male   DOB: 1966-04-19, 47 y.o.   MRN: [id number] Subjective/Complaints: 47 y.o. 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 Patient independent prior to admission working full-time. Admitted 01/30/2013 of left-sided weakness and altered mental status. Reports the patient had not been taking his blood pressure medications for 3 months. He was found on the floor by his wife. Blood pressure 190/110. 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 Patient was not a TPA candidate secondary to ICH. Patient was placed on the nicardipine drip for blood pressure control. Neurology services followup with conservative care. Followup cranial CT scan 01/31/2013 without change. Subcutaneous Lovenox initiated 02/01/2013 for DVT prophylaxis. NicoDerm patch added for history of tobacco abuse. Patient was maintained on ventilator support for a short time followed by critical care medicine. Blood pressure still with variables and remains on antihypertensive medication. Followup speech therapy for dysphagia and currently maintained on dysphagia 3 honey thick liquids  No C/O s BPs are up and down  Review of Systems  HENT: Positive for neck pain.   Musculoskeletal: Positive for joint pain.       Left shoulder pain    Objective: Vital Signs: Blood pressure 123/76, pulse 57, temperature 98 F (36.7 C), temperature source Oral, resp. rate 17, height 5\' 10"  (1.778 m), weight 62.2 kg (137 lb 2 oz), SpO2 97.00%. No results found. Results for orders placed during the hospital encounter of 02/06/13 (from the past 72 hour(s))  CBC     Status: Abnormal   Collection Time    02/06/13  4:28 PM      Result Value Range   WBC 8.3  4.0 - 10.5 K/uL   RBC 4.29  4.22 - 5.81 MIL/uL   Hemoglobin 15.4  13.0 - 17.0 g/dL   HCT  59.5  63.8 - 75.6 %   MCV 99.8  78.0 - 100.0 fL   MCH 35.9 (*) 26.0 - 34.0 pg   MCHC 36.0  30.0 - 36.0 g/dL   RDW 43.3  29.5 - 18.8 %   Platelets 202  150 - 400 K/uL  CREATININE, SERUM     Status: None   Collection Time    02/06/13  4:28 PM      Result Value Range   Creatinine, Ser 0.85  0.50 - 1.35 mg/dL   GFR calc non Af Amer >90  >90 mL/min   GFR calc Af Amer >90  >90 mL/min   Comment:            The eGFR has been calculated     using the CKD EPI equation.     This calculation has not been     validated in all clinical     situations.     eGFR's persistently     <90 mL/min signify     possible Chronic Kidney Disease.  CBC WITH DIFFERENTIAL     Status: Abnormal   Collection Time    02/07/13  5:35 AM      Result Value Range   WBC 8.6  4.0 - 10.5 K/uL   RBC 4.11 (*) 4.22 - 5.81 MIL/uL   Hemoglobin 14.6  13.0 - 17.0 g/dL   HCT 41.6  60.6 - 30.1 %   MCV 98.8  78.0 - 100.0 fL   MCH 35.5 (*) 26.0 - 34.0 pg  MCHC 36.0  30.0 - 36.0 g/dL   RDW 65.7  84.6 - 96.2 %   Platelets 225  150 - 400 K/uL   Neutrophils Relative % 55  43 - 77 %   Lymphocytes Relative 33  12 - 46 %   Monocytes Relative 10  3 - 12 %   Eosinophils Relative 2  0 - 5 %   Basophils Relative 0  0 - 1 %   Neutro Abs 4.7  1.7 - 7.7 K/uL   Lymphs Abs 2.8  0.7 - 4.0 K/uL   Monocytes Absolute 0.9  0.1 - 1.0 K/uL   Eosinophils Absolute 0.2  0.0 - 0.7 K/uL   Basophils Absolute 0.0  0.0 - 0.1 K/uL   Smear Review MORPHOLOGY UNREMARKABLE    COMPREHENSIVE METABOLIC PANEL     Status: Abnormal   Collection Time    02/07/13  5:35 AM      Result Value Range   Sodium 133 (*) 135 - 145 mEq/L   Potassium 3.8  3.5 - 5.1 mEq/L   Chloride 101  96 - 112 mEq/L   CO2 21  19 - 32 mEq/L   Glucose, Bld 96  70 - 99 mg/dL   BUN 18  6 - 23 mg/dL   Creatinine, Ser 9.52  0.50 - 1.35 mg/dL   Calcium 9.0  8.4 - 84.1 mg/dL   Total Protein 8.2  6.0 - 8.3 g/dL   Albumin 3.2 (*) 3.5 - 5.2 g/dL   AST 42 (*) 0 - 37 U/L   ALT 30  0 - 53  U/L   Alkaline Phosphatase 55  39 - 117 U/L   Total Bilirubin 0.4  0.3 - 1.2 mg/dL   GFR calc non Af Amer >90  >90 mL/min   GFR calc Af Amer >90  >90 mL/min   Comment:            The eGFR has been calculated     using the CKD EPI equation.     This calculation has not been     validated in all clinical     situations.     eGFR's persistently     <90 mL/min signify     possible Chronic Kidney Disease.     HEENT: normal and no facial droop Cardio: RRR Resp: CTA B/L and unlabored GI: BS positive and non tender Extremity:  Pulses positive and No Edema Skin:   Intact Neuro: Alert/Oriented, Confused, Cranial Nerve II-XII normal, Normal Sensory, Abnormal Motor 4/5 on Left side and Abnormal FMC Ataxic/ dec FMC Musc/Skel:  Normal Gen NAD   Assessment/Plan: 1. Functional deficits secondary to R ICH which require 3+ hours per day of interdisciplinary therapy in a comprehensive inpatient rehab setting. Physiatrist is providing close team supervision and 24 hour management of active medical problems listed below. Physiatrist and rehab team continue to assess barriers to discharge/monitor patient progress toward functional and medical goals. FIM: FIM - Bathing Bathing Steps Patient Completed: Chest;Right Arm;Left Arm;Abdomen;Front perineal area;Buttocks;Right upper leg;Left upper leg;Left lower leg (including foot);Right lower leg (including foot) Bathing: 4: Steadying assist  FIM - Upper Body Dressing/Undressing Upper body dressing/undressing steps patient completed: Thread/unthread left sleeve of pullover shirt/dress;Put head through opening of pull over shirt/dress;Pull shirt over trunk;Thread/unthread right sleeve of pullover shirt/dresss Upper body dressing/undressing: 5: Supervision: Safety issues/verbal cues FIM - Lower Body Dressing/Undressing Lower body dressing/undressing steps patient completed: Thread/unthread right underwear leg;Thread/unthread left underwear leg;Pull underwear  up/down;Thread/unthread right pants leg;Thread/unthread left  pants leg;Pull pants up/down;Don/Doff right sock;Don/Doff left sock;Don/Doff right shoe;Don/Doff left shoe Lower body dressing/undressing: 4: Steadying Assist  FIM - Toileting Toileting: 0: Activity did not occur  FIM - Archivist Transfers: 0-Activity did not occur  FIM - Banker Devices: Bed rails;HOB elevated Bed/Chair Transfer: 5: Supine > Sit: Supervision (verbal cues/safety issues);5: Sit > Supine: Supervision (verbal cues/safety issues);4: Bed > Chair or W/C: Min A (steadying Pt. > 75%);4: Chair or W/C > Bed: Min A (steadying Pt. > 75%)  FIM - Locomotion: Wheelchair Locomotion: Wheelchair: 0: Activity did not occur FIM - Locomotion: Ambulation Ambulation/Gait Assistance: 4: Min assist Locomotion: Ambulation: 4: Travels 150 ft or more with minimal assistance (Pt.>75%)  Comprehension Comprehension Mode: Auditory Comprehension: 4-Understands basic 75 - 89% of the time/requires cueing 10 - 24% of the time  Expression Expression Mode: Verbal Expression: 4-Expresses basic 75 - 89% of the time/requires cueing 10 - 24% of the time. Needs helper to occlude trach/needs to repeat words.  Social Interaction Social Interaction: 4-Interacts appropriately 75 - 89% of the time - Needs redirection for appropriate language or to initiate interaction.  Problem Solving Problem Solving: 3-Solves basic 50 - 74% of the time/requires cueing 25 - 49% of the time  Memory Memory: 3-Recognizes or recalls 50 - 74% of the time/requires cueing 25 - 49% of the time   Medical Problem List and Plan:  1. 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 DVT Prophylaxis/Anticoagulation: Subcutaneous Lovenox initiated 02/01/2013. Monitor platelet counts any signs of bleeding  3. Pain Management/headaches.  Tramadol 100 mg every 6 hours as needed. Monitor with increased activity  4. Neuropsych: This patient is not capable of making decisions on his/her own behalf.  5. Dysphagia. Dysphagia 3 honey thick liquids. Monitor for any signs of aspiration.  Continue IV fluids for hydration  6. Hypertension with poor medical compliance. Clonidine 0.2 mg twice a day. Monitor with increased activity. Provide counseling in regards to hypertension. Last 2 BPs ok 7. Tobacco/alcohol. NicoDerm patch. Provide counseling LOS (Days) 2 A FACE TO FACE EVALUATION WAS PERFORMED  KIRSTEINS,ANDREW E 02/08/2013, 8:08 AM

## 2013-02-08 NOTE — Progress Notes (Signed)
Speech Language Pathology Daily Session Note  Patient Details  Name: Rick Williams MRN: [id number] Date of Birth: [birth year]  Today's Date: 02/08/2013 Time: 1500-1530 Time Calculation (min): 30 min  Short Term Goals: Week 1: SLP Short Term Goal 1 (Week 1): Pt will utilize swallowing compensatory strategies with Min verbal cues to minimize overt s/s of aspiration.  SLP Short Term Goal 2 (Week 1): Pt will consume trials of thin liquids without overt s/s of aspiration with 50% of trials with Min verbal cues.  SLP Short Term Goal 3 (Week 1): Pt will utilize call bell to express wants/needs with Mod verbal and question cues.  SLP Short Term Goal 4 (Week 1): Pt will identify 2 physical and 2 cognitive deficits with Mod contextual, visual and question cues.  SLP Short Term Goal 5 (Week 1): Pt will demonstrate functional problem solving for basic and familair taks with Mod A verbal and question cues.  SLP Short Term Goal 6 (Week 1): Pt will attend to LUE during functional tasks with Mod A verbal and question cues.   Skilled Therapeutic Interventions: Treatment focus on cognitive goals. SLP facilitated session by providing Mod verbal, visual and question cues for functional problem solving with basic money and medication management.  Pt appeared less engaged with decreased social interaction throughout the session. The pt also appeared to demonstrate an increase in his left inattention to the environment during functional tasks. RN and PA notified.    FIM:  Comprehension Comprehension Mode: Auditory Comprehension: 4-Understands basic 75 - 89% of the time/requires cueing 10 - 24% of the time Expression Expression Mode: Verbal Expression: 3-Expresses basic 50 - 74% of the time/requires cueing 25 - 50% of the time. Needs to repeat parts of sentences. Social Interaction Social Interaction: 3-Interacts appropriately 50 - 74% of the time - May be physically or verbally inappropriate. Problem  Solving Problem Solving: 3-Solves basic 50 - 74% of the time/requires cueing 25 - 49% of the time Memory Memory: 3-Recognizes or recalls 50 - 74% of the time/requires cueing 25 - 49% of the time FIM - Eating Eating Activity: 5: Set-up assist for open containers  Pain Pain Assessment Pain Assessment: No/denies pain  Therapy/Group: Individual Therapy  Angeni Chaudhuri 02/08/2013, 3:49 PM

## 2013-02-08 NOTE — Progress Notes (Signed)
Occupational Therapy Session Note  Patient Details  Name: Rick Williams MRN: [id number] Date of Birth: January 03, 1966  Today's Date: 02/08/2013 Time: 0805-0905 and  1100-1130 Time Calculation (min): 60 min and 30 min  Short Term Goals: Week 1:  OT Short Term Goal 1 (Week 1): Pt will don LB clothing with supervision.   OT Short Term Goal 2 (Week 1): Pt will bathe with supervision. OT Short Term Goal 3 (Week 1): Pt will ambulate to toilet with supervision. OT Short Term Goal 4 (Week 1): Pt will demonstrate improved LUE coordination by opening toothpaste and other small containers with minimal difficulty. OT Short Term Goal 5 (Week 1): Pt will demonstrate improved left side awareness by placing his legs in wide BOS when reaching to floor level.  Skilled Therapeutic Interventions/Progress Updates:    Visit 1: No c/o pain.  Sitting BP 142/99.  Wife present for entire session. Scheduled for B/D this am. Pt and wife reported that he had a sponge bath the previous evening and he did not want to bathe again.  Pt was already dressed. Therapy focused on standing balance with overhead reaching and LUE motor control.  Pt worked on various unilateral arom and bimanual arom exercises to increase shoulder strength. Pt has 4/5 strength with MMT, but demonstrates poor muscular endurance and motor impersistence. LUE coordination with controlled circular movement patterns on table to simulate drying and waxing a car. He needed mod cues to fully form the circles to his left with cues to visually attend to the left. Reaching for and released small box with both hands with cues to fully attend to left side and fully place left hand on box.  Pt brushed teeth and donned socks and shoes with a focus on use of left hand in tasks. At end of session pt impulsively stood up to get into bed.  Discussed safety with patient again and explained reasons why he can not get up by himself. At end of session, pt resting in bed with alarm  on and call light in reach.  Visit 2: No c/o pain. Pt was in gym as he had just completed PT session. Pt seen this visit to focus on LUE motor control and left side awareness.  He worked on Energy manager. Pt was able to maintain left hand on ball with mod cues.  He reached for cones on his left side slightly behind his back with cues to fully grasp cone and reaching across midline with 60% accuracy.   Standing activities of stepping left foot in and out, standing at mirror with left hand on mirror for shoulder flexion, sit to stands.  Pt's wife present for therapy session.  Pt returned to his room in w/c with quick release belt.  Therapy Documentation Precautions:  Precautions Precautions: Fall;Other (comment) Precaution Comments: left side inattention; impulsive Restrictions Weight Bearing Restrictions: No    Vital Signs: Therapy Vitals BP: 142/99 mmHg Patient Position, if appropriate: Sitting Pain: Pain Assessment Pain Assessment: No/denies pain ADL:  See FIM for current functional status  Therapy/Group: Individual Therapy  SAGUIER,JULIA 02/08/2013, 9:25 AM

## 2013-02-09 ENCOUNTER — Inpatient Hospital Stay (HOSPITAL_COMMUNITY): Payer: MEDICAID | Admitting: Speech Pathology

## 2013-02-09 ENCOUNTER — Inpatient Hospital Stay (HOSPITAL_COMMUNITY): Payer: MEDICAID

## 2013-02-09 ENCOUNTER — Inpatient Hospital Stay (HOSPITAL_COMMUNITY): Payer: MEDICAID | Admitting: Occupational Therapy

## 2013-02-09 DIAGNOSIS — I619 Nontraumatic intracerebral hemorrhage, unspecified: Secondary | ICD-10-CM

## 2013-02-09 DIAGNOSIS — I10 Essential (primary) hypertension: Secondary | ICD-10-CM

## 2013-02-09 LAB — URINE CULTURE
Colony Count: NO GROWTH
Culture: NO GROWTH

## 2013-02-09 MED ORDER — LABETALOL HCL 100 MG PO TABS
100.0000 mg | ORAL_TABLET | Freq: Two times a day (BID) | ORAL | Status: DC
  Administered 2013-02-09 – 2013-02-11 (×5): 100 mg via ORAL
  Filled 2013-02-09 (×7): qty 1

## 2013-02-09 NOTE — Progress Notes (Signed)
Occupational Therapy Session Note  Patient Details  Name: Rick Williams MRN: [id number] Date of Birth: Mar 20, 1966  Today's Date: 02/09/2013 Time: 1000-1100 Time Calculation (min): 60 min  Short Term Goals: Week 1:  OT Short Term Goal 1 (Week 1): Pt will don LB clothing with supervision.   OT Short Term Goal 2 (Week 1): Pt will bathe with supervision. OT Short Term Goal 3 (Week 1): Pt will ambulate to toilet with supervision. OT Short Term Goal 4 (Week 1): Pt will demonstrate improved LUE coordination by opening toothpaste and other small containers with minimal difficulty. OT Short Term Goal 5 (Week 1): Pt will demonstrate improved left side awareness by placing his legs in wide BOS when reaching to floor level.  Skilled Therapeutic Interventions/Progress Updates:      Pt seen for BADL retraining of bathing at shower level and dressing with a focus on safety awareness, slowing down, left side awareness and functional use, and standing balance. Pt moved impulsively a few times, but his behavior was much better and less impulsive than in previous sessions.  He managed his LUE with more control during session and he was able to use both hands to open toothpaste and apply it. Initially pt needed several rest breaks as he had a severe headache and his blood pressure was running high. By end of session his blood pressure was 132/97.    Therapy Documentation Precautions:  Precautions Precautions: Fall;Other (comment) Precaution Comments: left side inattention; impulsive Restrictions Weight Bearing Restrictions: No  Pain: Pain Assessment Pain Assessment: 0-10 Pain Score:   9 Pain Type: Acute pain Pain Location: Head Pain Orientation: Posterior Pain Descriptors: Lambert Mody;Throbbing Pain Frequency: Intermittent Pain Onset: Gradual Patients Stated Pain Goal: 2 Pain Intervention(s): Medication (See eMAR) Multiple Pain Sites: No ADL:  See FIM for current functional  status  Therapy/Group: Individual Therapy  [name] 02/09/2013, 11:45 AM

## 2013-02-09 NOTE — Progress Notes (Signed)
Speech Language Pathology Daily Session Note  Patient Details  Name: Rick Williams MRN: [id number] Date of Birth: [birth year]  Today's Date: 02/09/2013 Time: 4098-1191 Time Calculation (min): 45 min  Short Term Goals: Week 1: SLP Short Term Goal 1 (Week 1): Pt will utilize swallowing compensatory strategies with Min verbal cues to minimize overt s/s of aspiration.  SLP Short Term Goal 2 (Week 1): Pt will consume trials of thin liquids without overt s/s of aspiration with 50% of trials with Min verbal cues.  SLP Short Term Goal 3 (Week 1): Pt will utilize call bell to express wants/needs with Mod verbal and question cues.  SLP Short Term Goal 4 (Week 1): Pt will identify 2 physical and 2 cognitive deficits with Mod contextual, visual and question cues.  SLP Short Term Goal 5 (Week 1): Pt will demonstrate functional problem solving for basic and familair taks with Mod A verbal and question cues.  SLP Short Term Goal 6 (Week 1): Pt will attend to LUE during functional tasks with Mod A verbal and question cues.   Skilled Therapeutic Interventions: Skilled treatment session focused on addressing dysphagia and cognitive goals. SLP facilitated session with trials of water via cup and Mod assist verbal cues to consume small single sips; patient demonstrated good hyo-laryngeal elevation with a suspected delayed swallow which resulted in an intermittent delayed, strong, reflexive cough.  SLP also facilitated session with Max faded to Mod verbal, visual and question cues for left attention to body and environment during a functional writing and card tasks.  Continue with current plan of care.   FIM:  Comprehension Comprehension Mode: Auditory Comprehension: 4-Understands basic 75 - 89% of the time/requires cueing 10 - 24% of the time Expression Expression Mode: Verbal Expression: 4-Expresses basic 75 - 89% of the time/requires cueing 10 - 24% of the time. Needs helper to occlude trach/needs to  repeat words. Social Interaction Social Interaction: 5-Interacts appropriately 90% of the time - Needs monitoring or encouragement for participation or interaction. Problem Solving Problem Solving: 3-Solves basic 50 - 74% of the time/requires cueing 25 - 49% of the time Memory Memory: 4-Recognizes or recalls 75 - 89% of the time/requires cueing 10 - 24% of the time FIM - Eating Eating Activity: 5: Set-up assist for open containers;5: Supervision/cues  Pain Pain Assessment Pain Assessment: No/denies pain  Therapy/Group: Individual Therapy  Charlane Ferretti., CCC-SLP [phone]  Rick Williams 02/09/2013, 4:18 PM

## 2013-02-09 NOTE — Progress Notes (Signed)
Physical Therapy Note  Patient Details  Name: Rick Williams MRN: [id number] Date of Birth: October 21, 1965 Today's Date: 02/09/2013  8:00 - 9:00 60 minutes  Individual session Patient denies pain.  Patient incontinent in lying in bed upon entering room. Patient removed briefs and t-shirt sitting edge of bed with supervision. Patient donned shirt with supervision/cueing. Patient requires min steady assist with standing to don pants. Patient ambulated to laundry room to put dirty clothes in washer with close supervision. Patient worked on Production designer, theatre/television/film system for weight shift, limits of stability, maze and random control. Patient also played "catch" on balance system with moving platform. Patient used rebounder with physioball to work on left inattention and use of left UE during dynamic balance activity. Patient with 1 LOB to left requiring min assist to regain. Patient performed high level ambulation including marching, side stepping, backwards walking, and tandem walking with close supervision to occasional min assist (with tandem walking). Patient returned to room and was left in wheelchair with quick release belt in place and items within reach.  11:00 - 11:30 30 minutes  Individual session Patient denies pain.   Nursing asked therapy to monitor Bp due to change in meds. Initial Bp 139/99. Patient wheeled outside in wheelchair. Patient ambulated outside on uneven surfaces (grass, mulch) and inclines/steps. Patient required close supervision for balance and moderate cueing to attend to left side. Patient maintains right gaze preference and tends to drag left LE during swing phase. Patient ambulated up 4 steps reciprocally with railing on right and min assist due to not getting left foot fully on step x 1. Bp upon return 155/97. Patient's wife instructed in assisting patient with ambulation using hand held assistance on left. Wife demonstrated understanding and ambulated patient 120  feet back to room. Discussed safety with wife and patient's impulsivity. Wife cleared to ambulate with patient in room.   Arelia Longest M 02/09/2013, 11:59 AM

## 2013-02-09 NOTE — Progress Notes (Addendum)
Patient ID: Rick Williams, male   DOB: 1965-11-27, 47 y.o.   MRN: [id number] Subjective/Complaints: 47 y.o. 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 Patient independent prior to admission working full-time. Admitted 01/30/2013 of left-sided weakness and altered mental status. Reports the patient had not been taking his blood pressure medications for 3 months. He was found on the floor by his wife. Blood pressure 190/110. 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 Patient was not a TPA candidate secondary to ICH. Patient was placed on the nicardipine drip for blood pressure control. Neurology services followup with conservative care. Followup cranial CT scan 01/31/2013 without change. Subcutaneous Lovenox initiated 02/01/2013 for DVT prophylaxis. NicoDerm patch added for history of tobacco abuse. Patient was maintained on ventilator support for a short time followed by critical care medicine. Blood pressure still with variables and remains on antihypertensive medication. Followup speech therapy for dysphagia and currently maintained on dysphagia 3 honey thick liquids  No C/O s. Slow to arouse this am.  Review of Systems  HENT: Positive for neck pain.   Musculoskeletal: Positive for joint pain.       Left shoulder pain    Objective: Vital Signs: Blood pressure 150/112, pulse 82, temperature 98 F (36.7 C), temperature source Oral, resp. rate 20, height 5\' 10"  (1.778 m), weight 62.2 kg (137 lb 2 oz), SpO2 99.00%. No results found. Results for orders placed during the hospital encounter of 02/06/13 (from the past 72 hour(s))  CBC     Status: Abnormal   Collection Time    02/06/13  4:28 PM      Result Value Range   WBC 8.3  4.0 - 10.5 K/uL   RBC 4.29  4.22 - 5.81 MIL/uL   Hemoglobin 15.4  13.0 - 17.0 g/dL   HCT 40.9  81.1 - 91.4 %   MCV 99.8  78.0 - 100.0 fL   MCH 35.9 (*) 26.0 - 34.0 pg   MCHC 36.0  30.0 - 36.0 g/dL   RDW 78.2  95.6 - 21.3 %   Platelets 202  150 - 400 K/uL  CREATININE, SERUM     Status: None   Collection Time    02/06/13  4:28 PM      Result Value Range   Creatinine, Ser 0.85  0.50 - 1.35 mg/dL   GFR calc non Af Amer >90  >90 mL/min   GFR calc Af Amer >90  >90 mL/min   Comment:            The eGFR has been calculated     using the CKD EPI equation.     This calculation has not been     validated in all clinical     situations.     eGFR's persistently     <90 mL/min signify     possible Chronic Kidney Disease.  CBC WITH DIFFERENTIAL     Status: Abnormal   Collection Time    02/07/13  5:35 AM      Result Value Range   WBC 8.6  4.0 - 10.5 K/uL   RBC 4.11 (*) 4.22 - 5.81 MIL/uL   Hemoglobin 14.6  13.0 - 17.0 g/dL   HCT 08.6  57.8 - 46.9 %   MCV 98.8  78.0 - 100.0 fL   MCH 35.5 (*) 26.0 - 34.0 pg  MCHC 36.0  30.0 - 36.0 g/dL   RDW 91.4  78.2 - 95.6 %   Platelets 225  150 - 400 K/uL   Neutrophils Relative % 55  43 - 77 %   Lymphocytes Relative 33  12 - 46 %   Monocytes Relative 10  3 - 12 %   Eosinophils Relative 2  0 - 5 %   Basophils Relative 0  0 - 1 %   Neutro Abs 4.7  1.7 - 7.7 K/uL   Lymphs Abs 2.8  0.7 - 4.0 K/uL   Monocytes Absolute 0.9  0.1 - 1.0 K/uL   Eosinophils Absolute 0.2  0.0 - 0.7 K/uL   Basophils Absolute 0.0  0.0 - 0.1 K/uL   Smear Review MORPHOLOGY UNREMARKABLE    COMPREHENSIVE METABOLIC PANEL     Status: Abnormal   Collection Time    02/07/13  5:35 AM      Result Value Range   Sodium 133 (*) 135 - 145 mEq/L   Potassium 3.8  3.5 - 5.1 mEq/L   Chloride 101  96 - 112 mEq/L   CO2 21  19 - 32 mEq/L   Glucose, Bld 96  70 - 99 mg/dL   BUN 18  6 - 23 mg/dL   Creatinine, Ser 2.13  0.50 - 1.35 mg/dL   Calcium 9.0  8.4 - 08.6 mg/dL   Total Protein 8.2  6.0 - 8.3 g/dL   Albumin 3.2 (*) 3.5 - 5.2 g/dL   AST 42 (*) 0 - 37 U/L   ALT 30  0 -  53 U/L   Alkaline Phosphatase 55  39 - 117 U/L   Total Bilirubin 0.4  0.3 - 1.2 mg/dL   GFR calc non Af Amer >90  >90 mL/min   GFR calc Af Amer >90  >90 mL/min   Comment:            The eGFR has been calculated     using the CKD EPI equation.     This calculation has not been     validated in all clinical     situations.     eGFR's persistently     <90 mL/min signify     possible Chronic Kidney Disease.  URINALYSIS, ROUTINE W REFLEX MICROSCOPIC     Status: Abnormal   Collection Time    02/08/13  4:05 PM      Result Value Range   Color, Urine YELLOW  YELLOW   APPearance CLEAR  CLEAR   Specific Gravity, Urine 1.022  1.005 - 1.030   pH 5.5  5.0 - 8.0   Glucose, UA NEGATIVE  NEGATIVE mg/dL   Hgb urine dipstick NEGATIVE  NEGATIVE   Bilirubin Urine NEGATIVE  NEGATIVE   Ketones, ur NEGATIVE  NEGATIVE mg/dL   Protein, ur 30 (*) NEGATIVE mg/dL   Urobilinogen, UA 0.2  0.0 - 1.0 mg/dL   Nitrite NEGATIVE  NEGATIVE   Leukocytes, UA NEGATIVE  NEGATIVE  URINE MICROSCOPIC-ADD ON     Status: None   Collection Time    02/08/13  4:05 PM      Result Value Range   Squamous Epithelial / LPF RARE  RARE   WBC, UA 0-2  <3 WBC/hpf   RBC / HPF 0-2  <3 RBC/hpf   Bacteria, UA RARE  RARE     HEENT: normal and no facial droop Cardio: RRR Resp: CTA B/L and unlabored GI: BS positive and non tender Extremity:  Pulses positive  and No Edema Skin:   Intact Neuro: Alert/Oriented, Confused, Cranial Nerve II-XII normal, Normal Sensory, Abnormal Motor 4/5 on Left side and Abnormal FMC Ataxic/ dec FMC Musc/Skel:  Normal Gen NAD   Assessment/Plan: 1. Functional deficits secondary to R ICH which require 3+ hours per day of interdisciplinary therapy in a comprehensive inpatient rehab setting. Physiatrist is providing close team supervision and 24 hour management of active medical problems listed below. Physiatrist and rehab team continue to assess barriers to discharge/monitor patient progress toward  functional and medical goals. FIM: FIM - Bathing Bathing Steps Patient Completed: Chest;Right Arm;Left Arm;Abdomen;Front perineal area;Buttocks;Right upper leg;Left upper leg;Left lower leg (including foot);Right lower leg (including foot) Bathing: 4: Steadying assist  FIM - Upper Body Dressing/Undressing Upper body dressing/undressing steps patient completed: Thread/unthread left sleeve of pullover shirt/dress;Put head through opening of pull over shirt/dress;Pull shirt over trunk;Thread/unthread right sleeve of pullover shirt/dresss Upper body dressing/undressing: 5: Supervision: Safety issues/verbal cues FIM - Lower Body Dressing/Undressing Lower body dressing/undressing steps patient completed: Thread/unthread right underwear leg;Thread/unthread left underwear leg;Pull underwear up/down;Thread/unthread right pants leg;Thread/unthread left pants leg;Pull pants up/down;Don/Doff right sock;Don/Doff left sock;Don/Doff right shoe;Don/Doff left shoe Lower body dressing/undressing: 4: Steadying Assist  FIM - Toileting Toileting: 0: Activity did not occur  FIM - Archivist Transfers: 0-Activity did not occur  FIM - Banker Devices: Arm rests Bed/Chair Transfer: 5: Bed > Chair or W/C: Supervision (verbal cues/safety issues);5: Chair or W/C > Bed: Supervision (verbal cues/safety issues)  FIM - Locomotion: Wheelchair Locomotion: Wheelchair: 0: Activity did not occur FIM - Locomotion: Ambulation Locomotion: Ambulation Assistive Devices:  (no AD) Ambulation/Gait Assistance: 4: Min guard Locomotion: Ambulation: 0: Activity did not occur  Comprehension Comprehension Mode: Auditory Comprehension: 4-Understands basic 75 - 89% of the time/requires cueing 10 - 24% of the time  Expression Expression Mode: Verbal Expression: 3-Expresses basic 50 - 74% of the time/requires cueing 25 - 50% of the time. Needs to repeat parts of sentences.  Social  Interaction Social Interaction: 6-Interacts appropriately with others with medication or extra time (anti-anxiety, antidepressant).  Problem Solving Problem Solving: 3-Solves basic 50 - 74% of the time/requires cueing 25 - 49% of the time  Memory Memory: 3-Recognizes or recalls 50 - 74% of the time/requires cueing 25 - 49% of the time   Medical Problem List and Plan:  1. 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 DVT Prophylaxis/Anticoagulation: Subcutaneous Lovenox initiated 02/01/2013. Monitor platelet counts any signs of bleeding  3. Pain Management/headaches. Tramadol 100 mg every 6 hours as needed. Monitor with increased activity  4. Neuropsych: This patient is not capable of making decisions on his/her own behalf.  5. Dysphagia. Dysphagia 3 honey thick liquids. Monitor for any signs of aspiration.  Continue IV fluids for hydration  6. Hypertension with poor control:  Clonidine 0.2 mg twice a day. Monitor with increased activity. Provide counseling in regards to hypertension. Initiated labetalol 100mg  bid 7. Tobacco/alcohol. NicoDerm patch. Provide counseling LOS (Days) 3 A FACE TO FACE EVALUATION WAS PERFORMED  Genasis Zingale T 02/09/2013, 8:41 AM

## 2013-02-10 ENCOUNTER — Inpatient Hospital Stay (HOSPITAL_COMMUNITY): Payer: MEDICAID | Admitting: Speech Pathology

## 2013-02-10 ENCOUNTER — Inpatient Hospital Stay (HOSPITAL_COMMUNITY): Payer: MEDICAID | Admitting: Physical Therapy

## 2013-02-10 ENCOUNTER — Encounter (HOSPITAL_COMMUNITY): Payer: Self-pay | Admitting: Occupational Therapy

## 2013-02-10 DIAGNOSIS — I10 Essential (primary) hypertension: Secondary | ICD-10-CM

## 2013-02-10 DIAGNOSIS — I619 Nontraumatic intracerebral hemorrhage, unspecified: Secondary | ICD-10-CM

## 2013-02-10 NOTE — Progress Notes (Signed)
Rick Williams is a 47 y.o. male 16-Apr-1966 [id number]  Subjective: No new complaints, except for mild HAs. No new problems. Slept well. Feeling OK.  Objective: Vital signs in last 24 hours: Temp:  [98.1 F (36.7 C)-98.3 F (36.8 C)] 98.3 F (36.8 C) (05/24 0552) Pulse Rate:  [61-82] 64 (05/24 0552) Resp:  [18-20] 18 (05/24 0552) BP: (124-161)/(78-112) 161/97 mmHg (05/24 0552) SpO2:  [95 %-99 %] 95 % (05/24 0552) Weight change:  Last BM Date: 02/09/13  Intake/Output from previous day: 05/23 0701 - 05/24 0700 In: 1180 [P.O.:1180] Out: 300 [Urine:300] Last cbgs: CBG (last 3)  No results found for this basename: GLUCAP,  in the last 72 hours   Physical Exam General: No apparent distress    HEENT: moist mucosa Lungs: Normal effort. Lungs clear to auscultation, no crackles or wheezes. Cardiovascular: Regular rate and rhythm, no edema Musculoskeletal:  No change from before Neurological: No new neurological deficits Wounds: N/A    Skin: clear Alert, cooperative   Lab Results: BMET    Component Value Date/Time   NA 133* 02/07/2013 0535   K 3.8 02/07/2013 0535   CL 101 02/07/2013 0535   CO2 21 02/07/2013 0535   GLUCOSE 96 02/07/2013 0535   BUN 18 02/07/2013 0535   CREATININE 0.82 02/07/2013 0535   CALCIUM 9.0 02/07/2013 0535   GFRNONAA >90 02/07/2013 0535   GFRAA >90 02/07/2013 0535   CBC    Component Value Date/Time   WBC 8.6 02/07/2013 0535   RBC 4.11* 02/07/2013 0535   HGB 14.6 02/07/2013 0535   HCT 40.6 02/07/2013 0535   PLT 225 02/07/2013 0535   MCV 98.8 02/07/2013 0535   MCH 35.5* 02/07/2013 0535   MCHC 36.0 02/07/2013 0535   RDW 12.6 02/07/2013 0535   LYMPHSABS 2.8 02/07/2013 0535   MONOABS 0.9 02/07/2013 0535   EOSABS 0.2 02/07/2013 0535   BASOSABS 0.0 02/07/2013 0535    Studies/Results: No results found.  Medications: I have reviewed the patient's current medications.  Assessment/Plan:  1. 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 DVT Prophylaxis/Anticoagulation: Subcutaneous Lovenox initiated 02/01/2013. Monitor platelet counts any signs of bleeding  3. Pain Management/headaches. Tramadol 100 mg every 6 hours as needed. Monitor with increased activity  4. Neuropsych: This patient is not capable of making decisions on his/her own behalf.  5. Dysphagia. Dysphagia 3 honey thick liquids. Monitor for any signs of aspiration.  Continue IV fluids for hydration  6. Hypertension with poor control: Clonidine 0.2 mg twice a day. Monitor with increased activity. Provide counseling in regards to hypertension. Initiated labetalol 100mg  bid. Will cont to watch BP 7. Tobacco/alcohol. NicoDerm patch. Provide counseling     Length of stay, days: 4  Sonda Primes , MD 02/10/2013, 8:12 AM

## 2013-02-10 NOTE — Plan of Care (Signed)
Problem: RH SKIN INTEGRITY Goal: RH STG SKIN FREE OF INFECTION/BREAKDOWN Outcome: Progressing Min assist     

## 2013-02-10 NOTE — Progress Notes (Signed)
Physical Therapy Session Note  Patient Details  Name: Rick Williams MRN: [id number] Date of Birth: 18-Sep-1966  Today's Date: 02/10/2013 Time: 0730-0800 Time Calculation (min): 30 min  Short Term Goals: Week 1:  PT Short Term Goal 1 (Week 1): STG's = LTG's due to LOS   Therapy Documentation Precautions:  Precautions Precautions: Fall;Other (comment) Precaution Comments: left side inattention; impulsive Restrictions Weight Bearing Restrictions: No Pain: Pain Assessment  : Headache, nursing provided meds earlier Pain Assessment: 0-10 Pain Score:   4  Therapeutic Exercise: (15') Nu-Step Level 5 x 10' with one resting stop Gait training:(15') Ambulating 3 x 60' using RW S/min-A, up/down 8 steps using B handrails with S/Mod-I  Patient still tends to be impulsive and Left side inattention needing constant verbal cues for L hand re-gripping while using Nu-Step and VC's when ambulating to avoid objects on L side.     Therapy/Group: Individual Therapy  [name] J : headache5/24/2014, 7:30 AM

## 2013-02-10 NOTE — Progress Notes (Signed)
Physical Therapy Session Note  Patient Details  Name: Rick Williams MRN: [id number] Date of Birth: Jan 21, 1966  Today's Date: 02/10/2013 Time: 1400-1500 Time Calculation (min): 60 min  Short Term Goals: Week 1:  PT Short Term Goal 1 (Week 1): STG's = LTG's due to LOS  Therapy Documentation Precautions:  Precautions Precautions: Fall;Other (comment) Precaution Comments: left side inattention; impulsive Restrictions Weight Bearing Restrictions: No Pain: Pain Assessment Pain Assessment: No/denies pain  Therapeutic Activity:(15') transfer training S/Independent sit<->stand, and chair<->w/c Gait Training:(15')  Up/down 15 steps without handrails with close supervision.  Gait in hallway 2 x 150' with SBA. Side-stepping in 1/2 squat to Right and Left Therapeutic Exercise:(15') Nu-Step x 12' Level 5, with 5 sets of 10 "Left hand push/pulls" without R hand. Wheelchair management:(15') propelling w/c with hands only needs vc's and min-A frequently to correct linear path at 3 x 150'.   Therapy/Group: Individual Therapy  [name] J 02/10/2013, 2:04 PM

## 2013-02-10 NOTE — Progress Notes (Signed)
Speech Language Pathology Daily Session Note  Patient Details  Name: Rick Williams MRN: [id number] Date of Birth: 18-Mar-1966  Today's Date: 02/10/2013 Time: 1130-1200 Time Calculation (min): 30 min  Short Term Goals: Week 1: SLP Short Term Goal 1 (Week 1): Pt will utilize swallowing compensatory strategies with Min verbal cues to minimize overt s/s of aspiration.  SLP Short Term Goal 2 (Week 1): Pt will consume trials of thin liquids without overt s/s of aspiration with 50% of trials with Min verbal cues.  SLP Short Term Goal 3 (Week 1): Pt will utilize call bell to express wants/needs with Mod verbal and question cues.  SLP Short Term Goal 4 (Week 1): Pt will identify 2 physical and 2 cognitive deficits with Mod contextual, visual and question cues.  SLP Short Term Goal 5 (Week 1): Pt will demonstrate functional problem solving for basic and familair taks with Mod A verbal and question cues.  SLP Short Term Goal 6 (Week 1): Pt will attend to LUE during functional tasks with Mod A verbal and question cues.   Skilled Therapeutic Interventions: Therapeutic intervention complete, providing dysphagia therapy.  Patient consumed D3 and regular consistencies, as well as thin liquids via cup.  He had no s/s of aspiration with any consistencies offered.  He does require mod verbal cues to slow down, swallow bolus before taking next bite. He is able to verbalize compensatory strategies and states he understands importance but then has difficulty with implementing strategies.  Continue with treatment plan   FIM:  FIM - Eating Eating Activity: 5: Set-up assist for open containers;5: Needs verbal cues/supervision  Pain Pain Assessment Pain Assessment: No/denies pain  Therapy/Group: Individual Therapy  Lenny Pastel 02/10/2013, 12:49 PM

## 2013-02-10 NOTE — Progress Notes (Signed)
Occupational Therapy Session Note  Patient Details  Name: Rick Williams MRN: [id number] Date of Birth: [birth year]  Today's Date: 02/10/2013 Time: 1000-1055 Time Calculation (min): 55 min  Short Term Goals: Week 1:  OT Short Term Goal 1 (Week 1): Pt will don LB clothing with supervision.   OT Short Term Goal 2 (Week 1): Pt will bathe with supervision. OT Short Term Goal 3 (Week 1): Pt will ambulate to toilet with supervision. OT Short Term Goal 4 (Week 1): Pt will demonstrate improved LUE coordination by opening toothpaste and other small containers with minimal difficulty. OT Short Term Goal 5 (Week 1): Pt will demonstrate improved left side awareness by placing his legs in wide BOS when reaching to floor level.  Skilled Therapeutic Interventions/Progress Updates:    1:1 Performed [facility] with left hand with clothespins picking them up and clipping them to side box in a squat position with objects in left visual field addressing FMC and left visual inattention. Continued to work on left side awareness in environment with community mobility with obstacle course in gym (stepping over and onto objects, navigating around cones etc; navigated from rehab center to outside and back to rehab center with min cuing for left side awareness and mod A for navigation. Went into gift shop requiring very close supervision with occasional min tactile cues for left body awareness. Tried to buy a shirt for wife but not enough monty- required mod A for money management and A with seeing how much money was actually  in his hand. Perform 9 hold peg test- requiring mod cues to not "cheat" with right hand and required >4min  Therapy Documentation Precautions:  Precautions Precautions: Fall;Other (comment) Precaution Comments: left side inattention; impulsive Restrictions Weight Bearing Restrictions: No Pain:  at end of session pt c/o mild HA- took BP 129/89 Rn aware. Pt returned to bed after session.  See FIM  for current functional status  Therapy/Group: Individual Therapy  Roney Mans [facility] 02/10/2013, 10:59 AM

## 2013-02-11 ENCOUNTER — Inpatient Hospital Stay (HOSPITAL_COMMUNITY): Payer: MEDICAID | Admitting: *Deleted

## 2013-02-11 MED ORDER — LABETALOL HCL 100 MG PO TABS
100.0000 mg | ORAL_TABLET | Freq: Three times a day (TID) | ORAL | Status: DC
  Administered 2013-02-11 (×2): 100 mg via ORAL
  Filled 2013-02-11 (×6): qty 1

## 2013-02-11 NOTE — Progress Notes (Signed)
Occupational Therapy Note  Patient Details  Name: Rick Williams MRN: [id number] Date of Birth: 1966-06-10 Today's Date: 02/11/2013  Pain:  7/10 head  (See MAR) Time:  1300-1345  (45 min) Individual session  BP=  Supine=  146/90;  Sitting=  138/87.  Addressed bed mobility, functional mobility, LUE in therapeutic activity.  Pt went from supine to sit with supervision.  Ambulated with minimal assist to gym.  Engaged in LUE coordination and strengthening activities with connect 4, velcro checkers.  Pt. Needed maximum tactile cues to LUE and not Right.  He demonstrated tip to tip grasp, hook grasp.  Had difficulty with lifting velcro checker off the velcro.  Needed verbal cues to attend to the left during activities.  Ambulated with minimal assist by pushing wc using LUE and then with both hands.  Provided cues to stay in middle and not hit left foot on wc.  Had pt engage in finding objects on left side.     Humberto Seals 02/11/2013, 6:28 PM

## 2013-02-11 NOTE — Progress Notes (Signed)
Rick Williams is a 47 y.o. male May 28, 1966 [id number]  Subjective: No new complaints, except for mild HAs. No new problems. Slept well. Feeling OK.  Objective: Vital signs in last 24 hours: Temp:  [98.2 F (36.8 C)-98.4 F (36.9 C)] 98.4 F (36.9 C) (05/25 0553) Pulse Rate:  [61-72] 61 (05/25 0553) Resp:  [18] 18 (05/25 0553) BP: (138-180)/(88-110) 156/90 mmHg (05/25 0553) SpO2:  [97 %-99 %] 97 % (05/25 0553) Weight change:  Last BM Date: 02/09/13  Intake/Output from previous day: 05/24 0701 - 05/25 0700 In: 1620 [P.O.:720; I.V.:900] Out: -  Last cbgs: CBG (last 3)  No results found for this basename: GLUCAP,  in the last 72 hours   Physical Exam General: No apparent distress    HEENT: moist mucosa Lungs: Normal effort. Lungs clear to auscultation, no crackles or wheezes. Cardiovascular: Regular rate and rhythm, no edema Musculoskeletal:  No change from before Neurological: No new neurological deficits Wounds: N/A    Skin: clear Alert, cooperative   Lab Results: BMET    Component Value Date/Time   NA 133* 02/07/2013 0535   K 3.8 02/07/2013 0535   CL 101 02/07/2013 0535   CO2 21 02/07/2013 0535   GLUCOSE 96 02/07/2013 0535   BUN 18 02/07/2013 0535   CREATININE 0.82 02/07/2013 0535   CALCIUM 9.0 02/07/2013 0535   GFRNONAA >90 02/07/2013 0535   GFRAA >90 02/07/2013 0535   CBC    Component Value Date/Time   WBC 8.6 02/07/2013 0535   RBC 4.11* 02/07/2013 0535   HGB 14.6 02/07/2013 0535   HCT 40.6 02/07/2013 0535   PLT 225 02/07/2013 0535   MCV 98.8 02/07/2013 0535   MCH 35.5* 02/07/2013 0535   MCHC 36.0 02/07/2013 0535   RDW 12.6 02/07/2013 0535   LYMPHSABS 2.8 02/07/2013 0535   MONOABS 0.9 02/07/2013 0535   EOSABS 0.2 02/07/2013 0535   BASOSABS 0.0 02/07/2013 0535    Studies/Results: No results found.  Medications: I have reviewed the patient's current medications.  Assessment/Plan:  1. Large right basal ganglia ICH with cytotoxic edema and intraventricular  extension and midline shift likely due to malignant hypertension  2. DVT Prophylaxis/Anticoagulation: Subcutaneous Lovenox initiated 02/01/2013. Monitor platelet counts any signs of bleeding  3. Pain Management/headaches. Tramadol 100 mg every 6 hours as needed. Monitor with increased activity  4. Neuropsych: This patient is not capable of making decisions on his/her own behalf.  5. Dysphagia. Dysphagia 3 honey thick liquids. Monitor for any signs of aspiration.  Continue IV fluids for hydration  6. Hypertension with poor control: Clonidine 0.2 mg twice a day. Monitor with increased activity. Provide counseling in regards to hypertension. Changed labetalol to 100mg  tid. Will cont to watch BP - may need to add Losartan 7. Tobacco/alcohol. NicoDerm patch. Provide counseling     Length of stay, days: 5  Sonda Primes , MD 02/11/2013, 8:35 AM

## 2013-02-12 ENCOUNTER — Inpatient Hospital Stay (HOSPITAL_COMMUNITY): Payer: MEDICAID | Admitting: Occupational Therapy

## 2013-02-12 ENCOUNTER — Inpatient Hospital Stay (HOSPITAL_COMMUNITY): Payer: MEDICAID | Admitting: Physical Therapy

## 2013-02-12 ENCOUNTER — Inpatient Hospital Stay (HOSPITAL_COMMUNITY): Payer: MEDICAID | Admitting: Speech Pathology

## 2013-02-12 ENCOUNTER — Inpatient Hospital Stay (HOSPITAL_COMMUNITY): Payer: MEDICAID | Admitting: *Deleted

## 2013-02-12 MED ORDER — CLONIDINE HCL 0.2 MG PO TABS
0.2000 mg | ORAL_TABLET | Freq: Three times a day (TID) | ORAL | Status: DC
  Administered 2013-02-12 – 2013-02-17 (×15): 0.2 mg via ORAL
  Filled 2013-02-12 (×18): qty 1

## 2013-02-12 MED ORDER — LABETALOL HCL 200 MG PO TABS
200.0000 mg | ORAL_TABLET | Freq: Three times a day (TID) | ORAL | Status: DC
  Administered 2013-02-12 – 2013-02-14 (×7): 200 mg via ORAL
  Filled 2013-02-12 (×10): qty 1

## 2013-02-12 NOTE — Progress Notes (Signed)
Social Work Patient ID: Rick Williams, male   DOB: [birth year], 47 y.o.   MRN: [id number] Met with pt and wife to discuss SSD and Medicaid applications, she reports the financial counselor-Michelle helped her begin the applications.  She is aware of the Wait time for both.  She wondered if there were any services to provide an aide for pt at home while she works.  Informed her the programs are through [facility] and this must be approved first. Otherwise it would be private pay.  Wife will look into family options, but is aware of the recommendation of 24 hour supervision for safety.

## 2013-02-12 NOTE — Progress Notes (Signed)
Dr. Lesia Hausen notified of patient's elevated BP of 174/100, manual; 156/90 (0553 on 02/11/13); 168/105 (2138 on 02/11/13); receives Clonidine 0.2 mg BID and Labetalol 100 mg TID.  Orders received: increase Labetalol to 200 mg po TID.  Give Clonidine 0.2 mg po and Labetalol 200 mg po now.

## 2013-02-12 NOTE — Progress Notes (Signed)
Patient ID: Rick Williams, male   DOB: 1965-12-10, 47 y.o.   MRN: [id number] Subjective/Complaints: 47 y.o. 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 Patient independent prior to admission working full-time. Admitted 01/30/2013 of left-sided weakness and altered mental status. Reports the patient had not been taking his blood pressure medications for 3 months. He was found on the floor by his wife. Blood pressure 190/110. 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 Patient was not a TPA candidate secondary to ICH. Patient was placed on the nicardipine drip for blood pressure control. Neurology services followup with conservative care. Followup cranial CT scan 01/31/2013 without change. Subcutaneous Lovenox initiated 02/01/2013 for DVT prophylaxis. NicoDerm patch added for history of tobacco abuse. Patient was maintained on ventilator support for a short time followed by critical care medicine. Blood pressure still with variables and remains on antihypertensive medication. Followup speech therapy for dysphagia and currently maintained on dysphagia 3 honey thick liquids   Has mild headache. No other complaints today Review of Systems  HENT: Positive for neck pain.   Musculoskeletal: Positive for joint pain.       Left shoulder pain    Objective: Vital Signs: Blood pressure 174/100, pulse 64, temperature 98.5 F (36.9 C), temperature source Oral, resp. rate 19, height 5\' 10"  (1.778 m), weight 62.2 kg (137 lb 2 oz), SpO2 97.00%. No results found. No results found for this or any previous visit (from the past 72 hour(s)).   HEENT: normal and no facial droop Cardio: RRR Resp: CTA B/L and unlabored GI: BS positive and non tender Extremity:  Pulses positive and No Edema Skin:   Intact Neuro: Alert/Oriented,  Confused, Cranial Nerve II-XII normal, Normal Sensory, Abnormal Motor 4/5 on Left side and Abnormal FMC Ataxic/ dec FMC Musc/Skel:  Normal Gen NAD   Assessment/Plan: 1. Functional deficits secondary to R ICH which require 3+ hours per day of interdisciplinary therapy in a comprehensive inpatient rehab setting. Physiatrist is providing close team supervision and 24 hour management of active medical problems listed below. Physiatrist and rehab team continue to assess barriers to discharge/monitor patient progress toward functional and medical goals. FIM: FIM - Bathing Bathing Steps Patient Completed: Chest;Right Arm;Left Arm;Abdomen;Front perineal area;Buttocks;Right upper leg;Left upper leg;Left lower leg (including foot);Right lower leg (including foot) Bathing: 4: Steadying assist  FIM - Upper Body Dressing/Undressing Upper body dressing/undressing steps patient completed: Thread/unthread left sleeve of pullover shirt/dress;Put head through opening of pull over shirt/dress;Pull shirt over trunk;Thread/unthread right sleeve of pullover shirt/dresss Upper body dressing/undressing: 5: Supervision: Safety issues/verbal cues FIM - Lower Body Dressing/Undressing Lower body dressing/undressing steps patient completed: Thread/unthread right pants leg;Thread/unthread left pants leg;Pull pants up/down;Don/Doff left shoe;Don/Doff right shoe Lower body dressing/undressing: 4: Steadying Assist  FIM - Toileting Toileting: 0: Activity did not occur  FIM - Archivist Transfers: 0-Activity did not occur  FIM - Banker Devices: Arm rests Bed/Chair Transfer: 5: Bed > Chair or W/C: Supervision (verbal cues/safety issues);5: Chair or W/C > Bed: Supervision (verbal cues/safety issues)  FIM - Locomotion: Wheelchair Locomotion: Wheelchair: 0: Activity did not occur FIM - Locomotion: Ambulation Locomotion: Ambulation Assistive Devices:  (no  AD) Ambulation/Gait Assistance: 4: Min guard Locomotion: Ambulation: 4: Travels 150 ft or more with minimal assistance (Pt.>75%)  Comprehension Comprehension Mode: Auditory Comprehension: 4-Understands basic 75 - 89% of the  time/requires cueing 10 - 24% of the time  Expression Expression Mode: Verbal Expression: 4-Expresses basic 75 - 89% of the time/requires cueing 10 - 24% of the time. Needs helper to occlude trach/needs to repeat words.  Social Interaction Social Interaction: 5-Interacts appropriately 90% of the time - Needs monitoring or encouragement for participation or interaction.  Problem Solving Problem Solving: 3-Solves basic 50 - 74% of the time/requires cueing 25 - 49% of the time  Memory Memory: 4-Recognizes or recalls 75 - 89% of the time/requires cueing 10 - 24% of the time   Medical Problem List and Plan:  1. 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 DVT Prophylaxis/Anticoagulation: Subcutaneous Lovenox initiated 02/01/2013. Monitor platelet counts any signs of bleeding  3. Pain Management/headaches. Tramadol 100 mg every 6 hours as needed--is effective. Monitor with increased activity  4. Neuropsych: This patient is not capable of making decisions on his/her own behalf.  5. Dysphagia. Dysphagia 3 honey thick liquids. Monitor for any signs of aspiration.  Continue IV fluids qhs for hydration  6. Hypertension with poor control:  Clonidine 0.2 mg increased to tid. Monitor with increased activity. Provide counseling in regards to hypertension. May need further titration of labetalol 7. Tobacco/alcohol. NicoDerm patch. Provide counseling LOS (Days) 6 A FACE TO FACE EVALUATION WAS PERFORMED  Eleuterio Dollar T 02/12/2013, 6:59 AM

## 2013-02-12 NOTE — Progress Notes (Signed)
Speech Language Pathology Daily Session Note  Patient Details  Name: Rick Williams MRN: [id number] Date of Birth: Sep 22, 1965  Today's Date: 02/12/2013 Time: 1330-1400 Time Calculation (min): 30 min  Short Term Goals: Week 1: SLP Short Term Goal 1 (Week 1): Pt will utilize swallowing compensatory strategies with Min verbal cues to minimize overt s/s of aspiration.  SLP Short Term Goal 2 (Week 1): Pt will consume trials of thin liquids without overt s/s of aspiration with 50% of trials with Min verbal cues.  SLP Short Term Goal 3 (Week 1): Pt will utilize call bell to express wants/needs with Mod verbal and question cues.  SLP Short Term Goal 4 (Week 1): Pt will identify 2 physical and 2 cognitive deficits with Mod contextual, visual and question cues.  SLP Short Term Goal 5 (Week 1): Pt will demonstrate functional problem solving for basic and familair taks with Mod A verbal and question cues.  SLP Short Term Goal 6 (Week 1): Pt will attend to LUE during functional tasks with Mod A verbal and question cues.   Skilled Therapeutic Interventions: Skilled treatment session focused on addressing dysphagia and cognitive goals.  SLP facilitated session with trials of regular textures and thin liquids with Mod assist verbal cues to follow recommended safe swallow precautions during self-feeding.  Patient exhibited intermittent throat clears during mixed consistency trials.  These symptoms combined with his cognitive deficits and history of silent aspiration; result in recommendation for an objective procedure prior to upgrade.  Patient and significant other informed of recommendations and in agreement.  SLP also educated them about procedure for the water protocol.  SLP also facilitated session with a writing task and patient required Max assist verbal cues to attend to the left while writing a grocery list and to his left upper extremity.     FIM:  Comprehension Comprehension Mode:  Auditory Comprehension: 4-Understands basic 75 - 89% of the time/requires cueing 10 - 24% of the time Expression Expression Mode: Verbal Expression: 4-Expresses basic 75 - 89% of the time/requires cueing 10 - 24% of the time. Needs helper to occlude trach/needs to repeat words. Social Interaction Social Interaction: 5-Interacts appropriately 90% of the time - Needs monitoring or encouragement for participation or interaction. Problem Solving Problem Solving: 3-Solves basic 50 - 74% of the time/requires cueing 25 - 49% of the time Memory Memory: 4-Recognizes or recalls 75 - 89% of the time/requires cueing 10 - 24% of the time FIM - Eating Eating Activity: 5: Supervision/cues  Pain Pain Assessment Pain Assessment: No/denies pain  Therapy/Group: Individual Therapy  Charlane Ferretti., CCC-SLP [phone]  [name] 02/12/2013, 3:57 PM

## 2013-02-12 NOTE — Progress Notes (Signed)
Occupational Therapy Session Note  Patient Details  Name: ELLA GUILLOTTE MRN: [id number] Date of Birth: 1966/02/01  Today's Date: 02/12/2013 Time: 0815-0900 and 1000-1100 Time Calculation (min): 45 min and 60 min  Short Term Goals: Week 1:  OT Short Term Goal 1 (Week 1): Pt will don LB clothing with supervision.   OT Short Term Goal 2 (Week 1): Pt will bathe with supervision. OT Short Term Goal 3 (Week 1): Pt will ambulate to toilet with supervision. OT Short Term Goal 4 (Week 1): Pt will demonstrate improved LUE coordination by opening toothpaste and other small containers with minimal difficulty. OT Short Term Goal 5 (Week 1): Pt will demonstrate improved left side awareness by placing his legs in wide BOS when reaching to floor level.  Skilled Therapeutic Interventions/Progress Updates:    Visit 1: Pt. complained of headache due to high blood pressure. Pt premedicated. Pt was willing to work in therapy, but did not have pt work on standing for long periods or ambulation.  Pt scheduled for B/D this am but pt and pt's wife reported that he showered last night as he had been sweating in the bed. He declined a shower this am. He did don LB clothing including ted hose with supervision and completed his grooming at the sink.  Pt was then seen for LUE neuro re-ed to increase [facility].  Pt's left grip strength 35 lbs (vs 95 on right hand).  He worked on [employer] of left hand for full grasping/ extending, wrist extension, and thumb ext/abd.  Grasping and releasing small pegs and dominoes. Pt had increased difficulty manipulating small objects, will work with larger pegs next session.  Mod cues to fully attend to left hand as pt continually tried to use right hand to pick up objects versus using left hand.  Provided with medium soft theraputty.  Close supervision with bed to chair and sit to stand today. At end of session, pt transferred back to bed to rest.  Visit 2: Pt blood pressure in sitting  147/92. He reports that his headache has decreased from a 9/10 to a 6/10.  Pt ambulated in and out of bathroom to toilet with supervision and then he ambulated to gym with supervision.  Pt worked on left hand coordination with visual scanning to left by sorting and dealing out dollar bills, small objects, larger pegs.  He needs mod to max cues to not use his right hand to substitute working his left hand. Pt may benefit from a modified constraint induct therapy program.  Min to mod cues to visual scan to left and attend to left hand as he continues to demonstrate motor impersistence.  He worked on bimanual exercise with weighted dowel bar. Pt's next therapist had arrived for his PT session.  Therapy Documentation Precautions:  Precautions Precautions: Fall;Other (comment) Precaution Comments: left side inattention; impulsive Restrictions Weight Bearing Restrictions: No Vital Signs: Therapy Vitals Temp: 98.5 F (36.9 C) Temp src: Oral Pulse Rate: 64 Resp: 19 BP: 174/100 mmHg Patient Position, if appropriate: Lying Oxygen Therapy SpO2: 97 % O2 Device: None (Room air) Pain: Pain Assessment Pain Assessment: No/denies pain Pain Score:   7 Pain Type: Acute pain Pain Location: Head Pain Descriptors: Headache Pain Intervention(s): Relaxation (medication given this morning) ADL:  See FIM for current functional status  Therapy/Group: Individual Therapy  Romelle Muldoon 02/12/2013, 9:18 AM

## 2013-02-12 NOTE — Progress Notes (Signed)
Physical Therapy Note  Patient Details  Name: BAILY SERPE MRN: [id number] Date of Birth: Aug 16, 1966 Today's Date: 02/12/2013  Time: 1100-1155 55 minutes  1:1 No c/o pain.  Dynamic gait training with speed and direction changes, head turns and stop/start with close supervision, mod cuing to attend to L side.  Gait on foam with L UE fine motor activity with sustained squats with close supervision, occasional min A due to impulsivity with reaching and bending.  Gait with ball bounce fwd/bkwd/sideways with close supervision, B UE coordination improved with repetition.  Step up/down/laterally to 4'' step for L LE eccentric control with close supervision, pt requires mod A for balance with stepping up backwards.  Nu step with B LE, L UE only x 7 minutes with cuing to attend to L and control grasp on handle.  Pt requires rests due to fatigue, limited by L inattention.   Kathlean Cinco 02/12/2013, 2:20 PM

## 2013-02-13 ENCOUNTER — Inpatient Hospital Stay (HOSPITAL_COMMUNITY): Payer: MEDICAID | Admitting: *Deleted

## 2013-02-13 ENCOUNTER — Inpatient Hospital Stay (HOSPITAL_COMMUNITY): Payer: MEDICAID

## 2013-02-13 ENCOUNTER — Inpatient Hospital Stay (HOSPITAL_COMMUNITY): Payer: MEDICAID | Admitting: Speech Pathology

## 2013-02-13 LAB — CREATININE, SERUM
Creatinine, Ser: 0.76 mg/dL (ref 0.50–1.35)
GFR calc Af Amer: >90 mL/min (ref >90)
GFR calc non Af Amer: >90 mL/min (ref >90)

## 2013-02-13 NOTE — Procedures (Addendum)
Objective Swallowing Evaluation: Modified Barium Swallowing Study  Patient Details  Name: Rick Williams MRN: [id number] Date of Birth: 1966-03-25  Today's Date: 02/13/2013 Time: 0910-0940 Time Calculation (min): 30 min  Past Medical History:  Past Medical History  Diagnosis Date  . Hypertension   . Pneumonia   . Hemothorax on left   . Stroke 2007    no deficits   Past Surgical History: No past surgical history on file. HPI:  47 year old male with PMH only of hypertension admitted via ED with CT brain revealed a large right ICH centered around the BG, with intraventricular extension and 8 mm right to let midline shift.  Required intubation and was extubated 5/15.  MBS completed 5/16 and recommended Dys. 3 textures with thin liquids.  Pt transferred to CIR 02/06/13 and has been participating in dysphagia treatment and is currently on the water protocol. MBS today to assess for possible upgrade.      Recommendation/Prognosis  Clinical Impression Dysphagia Diagnosis: Mild pharyngeal phase dysphagia Clinical impression: Pt presents with a mild pharyngeal dysphagia characterized by sensorimotor deficits that impact timing of swallow sequence and lead to silent penetration of nectar-thick and thin liquids. A chin tuck was effective in eliminating penetration with all consistencies. Recommend regular textures with thin liquids with full supervision for utilization of swallowing compensatory strategies.  Swallow Evaluation Recommendations Diet Recommendations: Regular;Thin liquid Liquid Administration via: Cup;No straw Medication Administration: Whole meds with puree Supervision: Patient able to self feed;Full supervision/cueing for compensatory strategies Compensations: Slow rate;Small sips/bites;Clear throat intermittently Postural Changes and/or Swallow Maneuvers: Seated upright 90 degrees;Chin tuck Oral Care Recommendations: Oral care BID Follow up Recommendations: Outpatient  SLP Prognosis Prognosis for Safe Diet Advancement: Good Individuals Consulted Consulted and Agree with Results and Recommendations: Patient;Family member/caregiver Family Member Consulted: wife  SLP Assessment/Plan Dysphagia Diagnosis: Mild pharyngeal phase dysphagia Clinical impression: Pt presents with a mild pharyngeal dysphagia characterized by sensorimotor deficits that impact timing of swallow sequence and lead to silent penetration of nectar-thick and thin liquids. A chin tuck was effective in eliminating penetration with both nectar-thick and thin liquids. Recommend regular textures with thin liquids with full supervision for utilization of swallowing compensatory strategies.   Short Term Goals: Week 1: SLP Short Term Goal 1 (Week 1): Pt will utilize swallowing compensatory strategies with Min verbal cues to minimize overt s/s of aspiration.  SLP Short Term Goal 2 (Week 1): Pt will consume trials of thin liquids without overt s/s of aspiration with 50% of trials with Min verbal cues.  SLP Short Term Goal 3 (Week 1): Pt will utilize call bell to express wants/needs with Mod verbal and question cues.  SLP Short Term Goal 4 (Week 1): Pt will identify 2 physical and 2 cognitive deficits with Mod contextual, visual and question cues.  SLP Short Term Goal 5 (Week 1): Pt will demonstrate functional problem solving for basic and familair taks with Mod A verbal and question cues.  SLP Short Term Goal 6 (Week 1): Pt will attend to LUE during functional tasks with Mod A verbal and question cues.   General:  Date of Onset: 01/30/13 HPI: 47 year old male with PMH only of hypertension admitted via ED with CT brain revealed a large right ICH centered around the BG, with intraventricular extension and 8 mm right to let midline shift.  Required intubation and was extubated 5/15.  MBS completed 5/16 and recommended Dys. 3 textures with thin liquids.  Pt transferred to CIR 02/06/13 and has  been  participating in dysphagia treatment and is currently on the water protocol. MBS today to assess for possible upgrade.  Type of Study: Modified Barium Swallowing Study Reason for Referral: Objectively evaluate swallowing function Previous Swallow Assessment: MBS on 02/02/13 and recommended Dys. 3 textures with honey-thick liquids Diet Prior to this Study: Dysphagia 3 (soft);Honey-thick liquids Temperature Spikes Noted: No Respiratory Status: Room air Length of Intubations (days): 2 days Date extubated: 02/01/13 Behavior/Cognition: Alert;Cooperative;Pleasant mood Oral Cavity - Dentition: Adequate natural dentition Oral Motor / Sensory Function: Impaired - see Bedside swallow eval Self-Feeding Abilities: Able to feed self Patient Positioning: Upright in bed Baseline Vocal Quality: Clear Volitional Cough: Strong Volitional Swallow: Able to elicit Anatomy: Within functional limits Pharyngeal Secretions: Not observed secondary MBS  Reason for Referral:  Objectively evaluate swallowing function   Oral Phase Oral Preparation/Oral Phase Oral Phase: WFL Pharyngeal Phase  Pharyngeal Phase Pharyngeal Phase: Impaired Pharyngeal - Honey Pharyngeal - Honey Cup: Delayed swallow initiation Pharyngeal - Nectar Pharyngeal - Nectar Cup: Delayed swallow initiation;Reduced airway/laryngeal closure;Penetration/Aspiration during swallow;Compensatory strategies attempted (Comment) Penetration/Aspiration details (nectar cup): Material enters airway, remains ABOVE vocal cords and not ejected out Pharyngeal - Thin Pharyngeal - Thin Cup: Delayed swallow initiation;Penetration/Aspiration during swallow;Compensatory strategies attempted (Comment) Penetration/Aspiration details (thin cup): Material enters airway, remains ABOVE vocal cords and not ejected out Pharyngeal - Solids Pharyngeal - Puree: Delayed swallow initiation Pharyngeal - Mechanical Soft: Delayed swallow initiation Cervical Esophageal Phase   Cervical Esophageal Phase Cervical Esophageal Phase: WFL  Venkat Ankney 02/13/2013, 4:17 PM

## 2013-02-13 NOTE — Progress Notes (Signed)
Patient ID: Rick Williams, male   DOB: 1966/06/15, 47 y.o.   MRN: [id number] Subjective/Complaints: 47 y.o. 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 Patient independent prior to admission working full-time. Admitted 01/30/2013 of left-sided weakness and altered mental status. Reports the patient had not been taking his blood pressure medications for 3 months. He was found on the floor by his wife. Blood pressure 190/110. 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 Patient was not a TPA candidate secondary to ICH. Patient was placed on the nicardipine drip for blood pressure control. Neurology services followup with conservative care. Followup cranial CT scan 01/31/2013 without change. Subcutaneous Lovenox initiated 02/01/2013 for DVT prophylaxis. NicoDerm patch added for history of tobacco abuse. Patient was maintained on ventilator support for a short time followed by critical care medicine. Blood pressure still with variables and remains on antihypertensive medication. Followup speech therapy for dysphagia and currently maintained on dysphagia 3 honey thick liquids   Has mild headache-meds help. No other complaints Review of Systems  HENT: Positive for neck pain.   Musculoskeletal: Positive for joint pain.       Left shoulder pain    Objective: Vital Signs: Blood pressure 127/98, pulse 58, temperature 98.2 F (36.8 C), temperature source Oral, resp. rate 18, height 5\' 10"  (1.778 m), weight 62.2 kg (137 lb 2 oz), SpO2 98.00%. No results found. Results for orders placed during the hospital encounter of 02/06/13 (from the past 72 hour(s))  CREATININE, SERUM     Status: None   Collection Time    02/13/13  5:30 AM      Result Value Range   Creatinine, Ser 0.76  0.50 - 1.35 mg/dL   GFR calc non Af Amer >90   >90 mL/min   GFR calc Af Amer >90  >90 mL/min   Comment:            The eGFR has been calculated     using the CKD EPI equation.     This calculation has not been     validated in all clinical     situations.     eGFR's persistently     <90 mL/min signify     possible Chronic Kidney Disease.     HEENT: normal and no facial droop Cardio: RRR Resp: CTA B/L and unlabored GI: BS positive and non tender Extremity:  Pulses positive and No Edema Skin:   Intact Neuro: Alert/Oriented, Confused, Cranial Nerve II-XII normal, Normal Sensory, Abnormal Motor 4/5 on Left side and Abnormal FMC Ataxic/ dec FMC Musc/Skel:  Normal Gen NAD   Assessment/Plan: 1. Functional deficits secondary to R ICH which require 3+ hours per day of interdisciplinary therapy in a comprehensive inpatient rehab setting. Physiatrist is providing close team supervision and 24 hour management of active medical problems listed below. Physiatrist and rehab team continue to assess barriers to discharge/monitor patient progress toward functional and medical goals. FIM: FIM - Bathing Bathing Steps Patient Completed: Chest;Right Arm;Left Arm;Abdomen;Front perineal area;Buttocks;Right upper leg;Left upper leg;Left lower leg (including foot);Right lower leg (including foot) Bathing: 4: Steadying assist  FIM - Upper Body Dressing/Undressing Upper body dressing/undressing steps patient completed: Thread/unthread left sleeve of pullover shirt/dress;Put head through opening of pull over shirt/dress;Pull shirt over trunk;Thread/unthread right sleeve of pullover shirt/dresss Upper body dressing/undressing: 5: Set-up assist to: Obtain clothing/put away FIM -  Lower Body Dressing/Undressing Lower body dressing/undressing steps patient completed: Thread/unthread right underwear leg;Thread/unthread left underwear leg;Pull underwear up/down;Thread/unthread right pants leg;Thread/unthread left pants leg;Pull pants up/down;Don/Doff left  sock;Don/Doff right sock;Don/Doff right shoe;Don/Doff left shoe Lower body dressing/undressing: 5: Supervision: Safety issues/verbal cues  FIM - Toileting Toileting steps completed by patient: Adjust clothing prior to toileting;Performs perineal hygiene;Adjust clothing after toileting Toileting: 5: Supervision: Safety issues/verbal cues  FIM - Toilet Transfers Toilet Transfers: 5-To toilet/BSC: Supervision (verbal cues/safety issues);5-From toilet/BSC: Supervision (verbal cues/safety issues)  FIM - Press photographer Assistive Devices: Arm rests Bed/Chair Transfer: 5: Bed > Chair or W/C: Supervision (verbal cues/safety issues);5: Chair or W/C > Bed: Supervision (verbal cues/safety issues)  FIM - Locomotion: Wheelchair Locomotion: Wheelchair: 0: Activity did not occur FIM - Locomotion: Ambulation Locomotion: Ambulation Assistive Devices:  (no AD) Ambulation/Gait Assistance: 4: Min guard Locomotion: Ambulation: 4: Travels 150 ft or more with minimal assistance (Pt.>75%)  Comprehension Comprehension Mode: Auditory Comprehension: 5-Follows basic conversation/direction: With no assist  Expression Expression Mode: Verbal Expression: 5-Expresses basic needs/ideas: With no assist  Social Interaction Social Interaction: 5-Interacts appropriately 90% of the time - Needs monitoring or encouragement for participation or interaction.  Problem Solving Problem Solving: 5-Solves basic problems: With no assist  Memory Memory: 5-Recognizes or recalls 90% of the time/requires cueing < 10% of the time   Medical Problem List and Plan:  1. 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 DVT Prophylaxis/Anticoagulation: Subcutaneous Lovenox initiated 02/01/2013. Monitor platelet counts any signs of bleeding  3. Pain Management/headaches. Tramadol 100 mg every 6 hours as needed--is  effective. Monitor with increased activity  4. Neuropsych: This patient is not capable of making decisions on his/her own behalf.  5. Dysphagia. Dysphagia 3 honey thick liquids. Monitor for any signs of aspiration.  Continue IV fluids qhs for hydration until fluids upgraded. On water protocol currently. 6. Hypertension with poor control:  Clonidine 0.2 mg increased to tid. Monitor with increased activity. Provide counseling in regards to hypertension. May need further titration of labetalol---watch today 7. Tobacco/alcohol. NicoDerm patch. Provide counseling LOS (Days) 7 A FACE TO FACE EVALUATION WAS PERFORMED  SWARTZ,ZACHARY T 02/13/2013, 8:49 AM

## 2013-02-13 NOTE — Progress Notes (Signed)
Physical Therapy Session Note  Patient Details  Name: Rick Williams MRN: [id number] Date of Birth: 08-12-1966  Today's Date: 02/13/2013 Time: 4098-1191 Time Calculation (min): 60 min  Short Term Goals: Week 1:  PT Short Term Goal 1 (Week 1): STG's = LTG's due to LOS  Skilled Therapeutic Interventions/Progress Updates:    Patient received supine in bed, asleep, requires mod encouragement to participate. Session focused on dynamic standing balance, dual tasking with mobility, and strengthening. See details below.  Patient performed obstacle course requiring him to weave in/out of 5 cones, step over 2 poles, step onto and across a 4 inch step, then across floor mat (compliant surface). Patient requires min guard and verbal cues for sequencing when stepping onto step. Patient performed x10 modified tandem sit<>stands with R LE anterior to L to facilitate increased weight bearing and strengthening through L LE. Discussion/education about falls risk upon discharge and indications vs. Contraindications for attempting floor transfer vs. Calling EMS, teach back method utilized. Patient able to recall several scenarios when calling EMS is appropriate. Patient performed floor transfer with supervision/verbal cues for proper sequencing. Patient only uses R UE when performing transfer, cued provided to use B UEs. Patient exercised on NuStep Level 6 x8' with B UE and B LE to facilitate improved strength, coordination, and endurance with reciprocal training. Patient requires verbal cues to maintain L grip on handle.  Patient returned to room with requests to use bathroom. Patient performs all aspects of toileting with supervision. Patient returned to supine in bed with bed alarm on, wife at bedside, and all needs within reach.  Therapy Documentation Precautions:  Precautions Precautions: Fall;Other (comment) Precaution Comments: left side inattention; impulsive Restrictions Weight Bearing Restrictions:  No Pain: Pain Assessment Pain Assessment: No/denies pain Pain Score: 0-No pain Locomotion : Ambulation Ambulation: Yes Ambulation/Gait Assistance: 4: Min guard;5: Supervision Ambulation Distance (Feet): 200 Feetx2 Assistive device: None Ambulation/Gait Assistance Details: Tactile cues for posture;Verbal cues for precautions/safety Ambulation/Gait Assistance Details: Patient instructed in gait training 200' x2 with close supervision and intermittent instances of min guard secondary to minor LOB that patient is able to self correct. Patient with inattention to L side and requires verbal cues for attention to/negotiation of obstacles on L. High Level Ambulation High Level Ambulation: Side stepping;Backwards walking;Head turns;Toe walking;Heel walking;Other high level ambulation Side Stepping: x30' with close supervision, intermittent min guard; x20' in each direction pushing BOSU ball along floor for hip abductor strengthening, single leg stance, and weight shifting Backwards Walking: x55' with close supervision, intermittent min guard Head Turns: x200' with horizontal/vertical head turns, able to maintain gait speed, requires supervision, one instance of min assist secondary to LOB laterally to the L. Toe Walking: x50' with handrail, requires min guard, no LOB Heel Walking: x50' with handrail, requires min guard, no LOB High Level Ambulation - Other Comments: bouning ball x200' with one LOB attempting to pick up ball when it got away from him, requiring mod assist to maintain balance; >300' gait training while naming foods in alphabetical order, no LOB Stairs / Additional Locomotion Stairs: Yes Stairs Assistance: 4: Min assist Stairs Assistance Details: Verbal cues for sequencing;Verbal cues for technique;Verbal cues for precautions/safety Stairs Assistance Details (indicate cue type and reason): Patient requires verbal cues for proper sequencing/technique when initially attempting to descend  stairs with alternating pattern. Also requires verbal cues for placement of whole foot on step when ascending. R rail ascending, L rail descending Stair Management Technique: One rail Right;One rail Left;Alternating pattern;Step to pattern;Forwards Number  of Stairs: 12 Height of Stairs: 6  Balance: Balance Balance Assessed: Yes Dynamic Standing Balance Dynamic Standing - Balance Support: No upper extremity supported Dynamic Standing - Level of Assistance: 4: Min assist Dynamic Standing - Balance Activities: Kicking ball Dynamic Standing - Comments: x2 min, patient requires constant min assist secondary to reaching LEs far outside BOS to kick ball; requires safety cues. Other Treatments: Weight Bearing Technique Weight Bearing Technique: Yes RUE Weight Bearing Technique: Quadruped;High kneeling LUE Weight Bearing Technique: Quadruped;High kneeling Response to Weight Bearing Technique: Prolonged quadruped, crawling, reaching with R UE to increase weight bearing through L UE; x10 bird dogs (contralateral UE/LE extension, alternating) for core strengthening, balance, and increased weight bearing.  See FIM for current functional status  Therapy/Group: Individual Therapy  Chipper Herb. Tess Potts, PT, DPT  02/13/2013, 12:14 PM

## 2013-02-13 NOTE — Progress Notes (Signed)
Occupational Therapy Session Note  Patient Details  Name: Rick Williams MRN: [id number] Date of Birth: Jun 14, 1966  Today's Date: 02/13/2013 Time: 0730-0830 and [id number] Time Calculation (min): 60 min and 45 min   Short Term Goals: Week 1:  OT Short Term Goal 1 (Week 1): Pt will don LB clothing with supervision.   OT Short Term Goal 2 (Week 1): Pt will bathe with supervision. OT Short Term Goal 3 (Week 1): Pt will ambulate to toilet with supervision. OT Short Term Goal 4 (Week 1): Pt will demonstrate improved LUE coordination by opening toothpaste and other small containers with minimal difficulty. OT Short Term Goal 5 (Week 1): Pt will demonstrate improved left side awareness by placing his legs in wide BOS when reaching to floor level.  Skilled Therapeutic Interventions/Progress Updates:    Session 1: OT spoke with nursing staff prior to therapy session as pt's BP was 170/108 with c/o headache. Pt eating breakfast upon arrival and required cues to use LUE to assist with opening containing and for functional placement on tray. Pt declined shower this AM and requested to only complete dressing task. Pt completed oral care in standing with close supervision. Initiated use of LUE to remove toothpaste cap and bring water cup to mouth to rinse. Used BUE to hold LB clothing items with decreased coordination and grip strength in LUE. Donned TED hose himself using both hands for grip and required increased time to thread on LLE. Pt very impulsive require cues to not get up alone. Completed toileting task and transfer with close supervision-steadying assist. Engaged in wall push up activities to facilitate proprioception in LUE. Cued to focus on keeping hands at same height on wall as LUE slowly slid down wall during reps. BP was 127/98 at end of therapy session.   Session 2: Therapy session focused on NM re-ed to LUE, and safety/awareness. Pt supine in bed and asleep requiring tactile and verbal cues  to arouse. Pt initially declined therapy d/t "feeling tired" however agreed after encouragement. Pt propelled self in w/c part way to the gym with constant cues for awareness of objects on Lt as pt ran into door and wall. Pt also required cues to use LUE to assist with propelling. Engaged in clothes pin activity initially in standing however pt having great difficulty and becoming frustrated. Completed in sitting with reach up on finger ladder to place clothes pins. Required 2 rest breaks d/t fatigue. Pt then removed in participated in large peg activity in sitting. Pt required increased time to grasp large pegs then coordinate to place in correct slot. Engaged in nu step to increase proprioception to LUE and LLE for 7:30 on workload 5. Required approx 4-5 verbal cues to correct Lt hand as it was sliding down hand grip on machine. Pt able to correct following verbal cues. Pt demonstrates decreased safety as he requires cues to lock w/c brakes before all transfers. Pt very impulsive as he stood from w/c (with brakes unlocked) immediately after OT asked pt to lock brakes. BP was 125/85 at beginning of session and pt reported no headache this PM.   Therapy Documentation Precautions:  Precautions Precautions: Fall;Other (comment) Precaution Comments: left side inattention; impulsive Restrictions Weight Bearing Restrictions: No General:   Vital Signs: Therapy Vitals Temp: 98.2 F (36.8 C) Temp src: Oral Pulse Rate: 58 Resp: 18 BP: 127/98 mmHg (per therapy) Patient Position, if appropriate: Lying Oxygen Therapy SpO2: 98 % O2 Device: None (Room air) Pain:  6/10 pain secondary  to headache. No c/o pain during session 2  See FIM for current functional status  Therapy/Group: Individual Therapy  Daneil Dan 02/13/2013, 8:36 AM

## 2013-02-14 ENCOUNTER — Inpatient Hospital Stay (HOSPITAL_COMMUNITY): Payer: MEDICAID | Admitting: Occupational Therapy

## 2013-02-14 ENCOUNTER — Inpatient Hospital Stay (HOSPITAL_COMMUNITY): Payer: Self-pay | Admitting: *Deleted

## 2013-02-14 ENCOUNTER — Inpatient Hospital Stay (HOSPITAL_COMMUNITY): Payer: MEDICAID | Admitting: Speech Pathology

## 2013-02-14 ENCOUNTER — Inpatient Hospital Stay (HOSPITAL_COMMUNITY): Payer: MEDICAID

## 2013-02-14 MED ORDER — BIOTENE DRY MOUTH MT LIQD
15.0000 mL | Freq: Two times a day (BID) | OROMUCOSAL | Status: DC
  Administered 2013-02-14 – 2013-02-17 (×7): 15 mL via OROMUCOSAL

## 2013-02-14 MED ORDER — NYSTATIN 100000 UNIT/ML MT SUSP
5.0000 mL | Freq: Four times a day (QID) | OROMUCOSAL | Status: DC
  Administered 2013-02-14 – 2013-02-17 (×11): 500000 [IU] via ORAL
  Filled 2013-02-14 (×16): qty 5

## 2013-02-14 MED ORDER — TRAZODONE HCL 50 MG PO TABS
50.0000 mg | ORAL_TABLET | Freq: Every evening | ORAL | Status: DC | PRN
  Administered 2013-02-14 – 2013-02-16 (×3): 50 mg via ORAL
  Filled 2013-02-14 (×3): qty 1

## 2013-02-14 MED ORDER — LABETALOL HCL 300 MG PO TABS
300.0000 mg | ORAL_TABLET | Freq: Three times a day (TID) | ORAL | Status: DC
  Administered 2013-02-14 – 2013-02-17 (×10): 300 mg via ORAL
  Filled 2013-02-14 (×15): qty 1

## 2013-02-14 NOTE — Progress Notes (Signed)
Occupational Therapy Weekly Progress Note  Patient Details  Name: Rick Williams MRN: [id number] Date of Birth: [birth year]  Today's Date: 02/14/2013  Patient has met 4 of 5 short term goals.  Patient currently requires cues for attention to Lt side during functional tasks and transfers. Patient currently requires supervision with self-care tasks with min-mod verbal cues to use LUE during functional tasks. Decreased grasp with left hand and patient requires cues to visually attend to Lt hand during tasks.   Patient continues to demonstrate the following deficits: muscle weakness, motor apraxia and decreased coordination, decreased attention to left, decreased safety awareness and decreased standing balance and hemiplegia.  and therefore will continue to benefit from skilled OT intervention to enhance overall performance with BADL.  Patient progressing toward long term goals..  Continue plan of care.  OT Short Term Goals Week 1:  OT Short Term Goal 1 (Week 1): Pt will don LB clothing with supervision.   OT Short Term Goal 1 - Progress (Week 1): Met OT Short Term Goal 2 (Week 1): Pt will bathe with supervision. OT Short Term Goal 2 - Progress (Week 1): Met OT Short Term Goal 3 (Week 1): Pt will ambulate to toilet with supervision. OT Short Term Goal 3 - Progress (Week 1): Met OT Short Term Goal 4 (Week 1): Pt will demonstrate improved LUE coordination by opening toothpaste and other small containers with minimal difficulty. OT Short Term Goal 4 - Progress (Week 1): Met OT Short Term Goal 5 (Week 1): Pt will demonstrate improved left side awareness by placing his legs in wide BOS when reaching to floor level. OT Short Term Goal 5 - Progress (Week 1): Progressing toward goal Week 2:  OT Short Term Goal 1 (Week 2): STGs=LTGs  Skilled Therapeutic Interventions/Progress Updates:      Therapy Documentation Precautions:  Precautions Precautions: Fall Precaution Comments: left side  inattention; impulsive Restrictions Weight Bearing Restrictions: No General:   Vital Signs: Therapy Vitals Pulse Rate: 68 BP: 132/87 mmHg Patient Position, if appropriate: Lying Pain: Pain Assessment Pain Score: 10-Worst pain ever Pain Type: Acute pain Pain Location: Head Pain Descriptors / Indicators: Headache Pain Frequency: Intermittent Pain Onset: Gradual Patients Stated Pain Goal: 3 Pain Intervention(s): Medication (See eMAR) ADL:   Exercises:   Other Treatments:    See FIM for current functional status  Therapy/Group: n/a  Candace Begue N 02/14/2013, 3:33 PM

## 2013-02-14 NOTE — Progress Notes (Signed)
Physical Therapy Weekly Progress Note  Patient Details  Name: Rick Williams MRN: [id number] Date of Birth: [birth year]  Today's Date: 02/14/2013 Time: 0900-0930 Time Calculation (min): 30 min  Patient has met 4 of 7 short term/long term goals.  Due to safety issues and decreased balance, pt has not achieved modified independence for transfers, or supervision for stairs.  Patient continues to demonstrate the following deficits: awareness, motor control, safety, balance and therefore will continue to benefit from skilled PT intervention to enhance overall performance with activity tolerance, balance, ability to compensate for deficits, functional use of  left upper extremity and left lower extremity, attention and awareness.  Patient progressing toward long term goals..  Continue plan of care.  PT Short Term Goals Week 1:  PT Short Term Goal 1 (Week 1): STG's = LTG's due to LOS    Skilled Therapeutic Interventions/Progress Updates:  Treatment focused on neuromuscular re-education via VCs, tactile cues for L attention, balance retraining  Pt finished breakfast sitting EOB, with mod cues for L hand use to cut food, dropping knife once in 5 minutes.  Gait in room to toilet and sink , toilet transfer with supervision.  Fall prevention 1 exs in standing: calf raises/toe up, alternating bil hip abduction, both with min HHA.  Pt requested to rest after exs.  BP = 111/79, HR 73 in sitting.    Therapy Documentation Precautions:  Precautions Precautions: Fall Precaution Comments: left side inattention; impulsive Restrictions Weight Bearing Restrictions: No   Pain: Pain Assessment Pain Assessment: No/denies pain Pain Score:   9 Pain Location: Head Pain Descriptors / Indicators: Headache Pain Intervention(s): Medication (See eMAR)   See FIM for current functional status  Therapy/Group: Individual Therapy  [name] 02/14/2013, 10:11 AM

## 2013-02-14 NOTE — Progress Notes (Signed)
NUTRITION FOLLOW UP  Intervention:    Continue MVI daily, Ensure Pudding PO QID with medications, and Magic Cups with meals.  Nutrition Dx:   Unintentional wt loss related to physical activity (per wife) as evidenced by 20% wt loss x 9 months. Ongoing.  Goal:   Intake to meet >90% of estimated nutrition needs. Met.  Monitor:   PO intake, labs, weight trend.  Assessment:   Diet upgraded to Regular with thin liquids, chin tuck with all liquids. Patient reports excellent PO intake of meals and supplements. Consumes 100% of all meals, eats Ensure Pudding 1 container QID with medications, and eats Magic Cups sent with meals. Intake is adequate to meet estimated needs.   Height: Ht Readings from Last 1 Encounters:  02/06/13 5\' 10"  (1.778 m)    Weight Status:   Wt Readings from Last 1 Encounters:  02/06/13 137 lb 2 oz (62.2 kg)   No new weight available.   Re-estimated needs:  Kcal: 1800 - 2000  Protein: 80 - 95 grams  Fluid: 1.8 - 2 liters  Skin: intact  Diet Order: Regular with thin liquids   Intake/Output Summary (Last 24 hours) at 02/14/13 1025 Last data filed at 02/14/13 0900  Gross per 24 hour  Intake    462 ml  Output    200 ml  Net    262 ml    Last BM: 5/27   Labs:   Recent Labs Lab 02/13/13 0530  CREATININE 0.76    CBG (last 3)  No results found for this basename: GLUCAP,  in the last 72 hours  Scheduled Meds: . cloNIDine  0.2 mg Oral TID  . enoxaparin (LOVENOX) injection  40 mg Subcutaneous Q24H  . feeding supplement  1 Container Oral TID WC & HS  . labetalol  300 mg Oral TID  . multivitamin  1 tablet Oral Daily  . nicotine  14 mg Transdermal Daily  . pantoprazole  40 mg Oral Q1200  . senna-docusate  1 tablet Oral BID  . thiamine  100 mg Oral Daily    Continuous Infusions: None   Joaquin Courts, RD, LDN, CNSC Pager [phone] After Hours Pager [phone]

## 2013-02-14 NOTE — Progress Notes (Signed)
Social Work Patient ID: Rick Williams, male   DOB: 14-Nov-1965, 47 y.o.   MRN: [id number] Met with pt and wife to inform team conference goals-supervision level and discharge 5/31.  Continuing to work on blood pressure and adjusting meds. Wife aware pt needs 24 hour supervision and need for OP therapies.  Will pursue medical clinic in [place] and assistance with meds.  Wife reports  Pt's parents can transport to OP therapies.  Agreeable to plans and aware of MD working on blood pressure.

## 2013-02-14 NOTE — Progress Notes (Signed)
Physical Therapy Note  Patient Details  Name: Rick Williams MRN: [id number] Date of Birth: 1966-03-17 Today's Date: 02/14/2013  Patient missed 30 minutes of skilled physical therapy this PM secondary to refusal. Patient received supine in bed, asleep and wife at bedside. Patient able to be aroused, but refused to open eyes or participate in therapy despite max verbal and tactile cues and max encouragement x15'. Patient left supine in bed with bed alarm on and all needs within reach. Will follow up as able.   Zella Richer Charle Clear S. Devra Stare, PT, DPT 02/14/2013, 4:23 PM

## 2013-02-14 NOTE — Patient Care Conference (Signed)
Inpatient RehabilitationTeam Conference and Plan of Care Update Date: 02/14/2013   Time: 10:40 Am    Patient Name: Rick Williams      Medical Record Number: [id number]  Date of Birth: [birth year] Sex: Male         Room/Bed: 4151/4151-01 Payor Info: Payor: MEDICAID POTENTIAL / Plan: MEDICAID POTENTIAL / Product Type: *No Product type* /    Admitting Diagnosis: R CVA  Admit Date/Time:  02/06/2013  4:00 PM Admission Comments: No comment available   Primary Diagnosis:  ICH (intracerebral hemorrhage) Principal Problem: ICH (intracerebral hemorrhage)  Patient Active Problem List   Diagnosis Date Noted  . ICH (intracerebral hemorrhage) 02/07/2013  . Essential hypertension, malignant 02/06/2013  . Encounter for long-term (current) use of high-risk medication 02/06/2013  . Hemorrhagic stroke 01/30/2013  . Acute respiratory failure due to AMS 01/30/2013  . Hypertension 01/30/2013  . IVH (intraventricular hemorrhage) 01/30/2013  . Intracranial hypertension 01/30/2013    Expected Discharge Date: Expected Discharge Date: 02/17/13  Team Members Present: Physician leading conference: Dr. Faith Rogue Social Worker Present: Dossie Der, LCSW Nurse Present: Other (comment) Charisse March HIcks-RN) PT Present: Edman Circle, PT;Caroline Adriana Simas, PT;Other (comment) Clarisse Gouge ripa_PT) OT Present: Leonette Monarch, Felipa Eth, OT SLP Present: Fae Pippin, SLP     Current Status/Progress Goal Weekly Team Focus  Medical   bp still a problem. diet upgraded, pain issues  control bp, stabilize medically for dc  see prior, pt/family ed   Bowel/Bladder   Continent of bowel and bladder         Swallow/Nutrition/ Hydration   Upgraded to Regular textures and thin liquids 02/13/13 with full supervsion for use of chin tuck   least restrictive p.o. intake  increase carryover of compensatory strategies   ADL's   supervision-steadying assist with functional transfers and ADLs; impulsive and required cues  for safety; decresaed propriocpetion and coordination noted in LUE  supervision overall  ADL retraining, balance, coordination, perceptual skills, cognitive activities, NM re-ed, family education   Mobility   S-min guard overall  mod I-Supervision overall  safety, gait, transfers, stairs, balance, activity tolerance, dual tasking, attention to the L, NMR L UE and L LE   Communication   Mod I          Safety/Cognition/ Behavioral Observations  Min-Mod assist   Min assist  increase recall, carryover and self monitoring for left attention   Pain   pt complians of H/A at times. Ultram 50mg  q6hr prn, tylenol 325-650mg  q4hrs prn. pain managed with medications  pain equal to or less than 3  monitor pain and medicate as needed   Skin   skin CDI  skin will remain free from infection/breakdown  monitor and assess skin qshift      *See Care Plan and progress notes for long and short-term goals.  Barriers to Discharge: pain, bp    Possible Resolutions to Barriers:  family and pt education    Discharge Planning/Teaching Needs:  Home with wife, aware needs 24 hour supervision for safety/impulsive.  Working on disability and medicaid      Team Discussion:  Upgraded diet to regular-thin liquids.  MD working on blood pressure and adjusting meds.  Wife here observing in therapies.  Meeting goals ready for discharge Sat  Revisions to Treatment Plan:  None   Continued Need for Acute Rehabilitation Level of Care: The patient requires daily medical management by a physician with specialized training in physical medicine and rehabilitation for the following conditions: Daily direction of a  multidisciplinary physical rehabilitation program to ensure safe treatment while eliciting the highest outcome that is of practical value to the patient.: Yes Daily medical management of patient stability for increased activity during participation in an intensive rehabilitation regime.: Yes Daily analysis of  laboratory values and/or radiology reports with any subsequent need for medication adjustment of medical intervention for : Other;Neurological problems;Cardiac problems  Ladasha Schnackenberg, Lemar Livings 02/14/2013, 2:00 PM

## 2013-02-14 NOTE — Progress Notes (Signed)
Speech Language Pathology Daily Session Note  Patient Details  Name: Rick Williams MRN: [id number] Date of Birth: 10-09-1965  Today's Date: 02/14/2013 Time: 0835-0900 Time Calculation (min): 25 min  Short Term Goals: Week 1: SLP Short Term Goal 1 (Week 1): Pt will utilize swallowing compensatory strategies with Min verbal cues to minimize overt s/s of aspiration.  SLP Short Term Goal 2 (Week 1): Pt will consume trials of thin liquids without overt s/s of aspiration with 50% of trials with Min verbal cues.  SLP Short Term Goal 3 (Week 1): Pt will utilize call bell to express wants/needs with Mod verbal and question cues.  SLP Short Term Goal 4 (Week 1): Pt will identify 2 physical and 2 cognitive deficits with Mod contextual, visual and question cues.  SLP Short Term Goal 5 (Week 1): Pt will demonstrate functional problem solving for basic and familair taks with Mod A verbal and question cues.  SLP Short Term Goal 6 (Week 1): Pt will attend to LUE during functional tasks with Mod A verbal and question cues.   Skilled Therapeutic Interventions: Skilled treatment session focused on addressing dysphagia goals.  SLP facilitated session with breakfast of regular textures and thin liquids with Supervision level verbal cues to follow recommended safe swallow precautions during self-feeding.  Patient exhibited no overt s/s of aspiration with meals due to consistent use of chin tuck.  SLP also facilitated session with Min assist verbal cues to atted to left sided pocketing and left upper extremity during meal.  Continue wtih current plan of care.   Of note, patient reporting severe headache and sweating during session RN aware and stated it was ok to proceed with breakfast.   FIM:  Comprehension Comprehension Mode: Auditory Comprehension: 4-Understands basic 75 - 89% of the time/requires cueing 10 - 24% of the time Expression Expression Mode: Verbal Expression: 5-Expresses complex 90% of the  time/cues < 10% of the time Social Interaction Social Interaction: 5-Interacts appropriately 90% of the time - Needs monitoring or encouragement for participation or interaction. Problem Solving Problem Solving: 4-Solves basic 75 - 89% of the time/requires cueing 10 - 24% of the time Memory Memory: 5-Recognizes or recalls 90% of the time/requires cueing < 10% of the time FIM - Eating Eating Activity: 4: Helper checks for pocketed food  Pain Pain Assessment Pain Assessment: No/denies pain Pain Score:   9 Pain Location: Head Pain Descriptors / Indicators: Headache Pain Intervention(s): Medication (See eMAR)  Therapy/Group: Individual Therapy  Charlane Ferretti., CCC-SLP [phone]  Desjuan Stearns 02/14/2013, 9:06 AM

## 2013-02-14 NOTE — Progress Notes (Addendum)
02/14/13 0609  Vitals  Temp 98.9 F (37.2 C)  Temp src Oral  BP ! 190/115 mmHg  BP Location Right arm  BP Method Automatic  Patient Position, if appropriate Lying  Pulse Rate ! 57  Pulse Rate Source Dinamap  Resp 20  Oxygen Therapy  SpO2 96 %  O2 Device None (Room air)  Pt Bp was high as stated above. 8 AM clonidine 0.2 mg and Labetalol 200 mg was given at 645 am as Marissa Nestle, P.A. Ordered. Will recheck BP  in 30 mn

## 2013-02-14 NOTE — Progress Notes (Signed)
Occupational Therapy Session Note  Patient Details  Name: Rick Williams MRN: [id number] Date of Birth: 05/15/1966  Today's Date: 02/14/2013 Time: 1324-4010 Time Calculation (min): 59 min  Skilled Therapeutic Interventions/Progress Updates:    Worked on shower, dressing, and grooming activities to begin this session.  Pt able to perform all bathing sit to stand with supervision as well as dressing.  Still demonstrates moderate ideomotor apraxia when attempting to perform FM tasks with the LUE.  Increased difficulty when attempting to tie his pajama pants or pick up and replace his chapstick cap.  Encouraged him to continue focusing on the left hand and not just attempt all tasks with the right hand because it's more difficult with the left.  At conclusion of bathing had pt ambulate to the gym and work on LUE coordination tasks.  Performed ball catch and toss with both hands progressing from larger ball to smaller basketball.  Also worked on picking up small pegs and placing in grid.  Pt needing increased time and cueing to not try and use the RUE.  Therapy Documentation Precautions:  Precautions Precautions: Fall Precaution Comments: left side inattention; impulsive Restrictions Weight Bearing Restrictions: No  Pain: Pain Assessment Pain Assessment: No/denies pain Pain Score: Asleep Pain Location: Head Pain Intervention(s): Medication (See eMAR) ADL: See FIM for current functional status  Therapy/Group: Individual Therapy  Verne Cove OTR/L 02/14/2013, 12:43 PM

## 2013-02-14 NOTE — Progress Notes (Addendum)
Patient ID: Rick Williams, male   DOB: [birth year], 47 y.o.   MRN: [id number] Subjective/Complaints: 47 y.o. 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 Patient independent prior to admission working full-time. Admitted 01/30/2013 of left-sided weakness and altered mental status. Reports the patient had not been taking his blood pressure medications for 3 months. He was found on the floor by his wife. Blood pressure 190/110. 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 Patient was not a TPA candidate secondary to ICH. Patient was placed on the nicardipine drip for blood pressure control. Neurology services followup with conservative care. Followup cranial CT scan 01/31/2013 without change. Subcutaneous Lovenox initiated 02/01/2013 for DVT prophylaxis. NicoDerm patch added for history of tobacco abuse. Patient was maintained on ventilator support for a short time followed by critical care medicine. Blood pressure still with variables and remains on antihypertensive medication. Followup speech therapy for dysphagia and currently maintained on dysphagia 3 honey thick liquids   Headache and sore throat  Review of Systems  HENT: Positive for neck pain.   Musculoskeletal: Positive for joint pain.       Left shoulder pain    Objective: Vital Signs: Blood pressure 179/110, pulse 55, temperature 98.9 F (37.2 C), temperature source Oral, resp. rate 20, height 5\' 10"  (1.778 m), weight 62.2 kg (137 lb 2 oz), SpO2 96.00%. Dg Swallowing Func-speech Pathology  02/13/2013   Huston Foley, CCC-SLP     02/13/2013  4:19 PM Objective Swallowing Evaluation: Modified Barium Swallowing Study   Patient Details  Name: Rick Williams MRN: [id number] Date of Birth: 08-May-1966  Today's Date: 02/13/2013 Time: 0910-0940 Time Calculation  (min): 30 min  Past Medical History:  Past Medical History  Diagnosis Date  . Hypertension   . Pneumonia   . Hemothorax on left   . Stroke 2007    no deficits   Past Surgical History: No past surgical history on file. HPI:  47 year old male with PMH only of hypertension admitted via ED  with CT brain revealed a large right ICH centered around the BG,  with intraventricular extension and 8 mm right to let midline  shift.  Required intubation and was extubated 5/15.  MBS  completed 5/16 and recommended Dys. 3 textures with thin liquids.   Pt transferred to CIR 02/06/13 and has been participating in  dysphagia treatment and is currently on the water protocol. MBS  today to assess for possible upgrade.      Recommendation/Prognosis  Clinical Impression Dysphagia Diagnosis: Mild pharyngeal phase dysphagia Clinical impression: Pt presents with a mild pharyngeal dysphagia  characterized by sensorimotor deficits that impact timing of  swallow sequence and lead to silent penetration of nectar-thick  and thin liquids. A chin tuck was effective in eliminating  penetration with all consistencies. Recommend regular textures  with thin liquids with full supervision for utilization of  swallowing compensatory strategies.  Swallow Evaluation Recommendations Diet Recommendations: Regular;Thin liquid Liquid Administration via: Cup;No straw Medication Administration: Whole meds with puree Supervision: Patient able to self feed;Full supervision/cueing  for compensatory strategies Compensations: Slow rate;Small sips/bites;Clear throat  intermittently Postural Changes and/or Swallow Maneuvers: Seated upright 90  degrees;Chin tuck Oral Care Recommendations: Oral care BID Follow up Recommendations: Outpatient SLP Prognosis Prognosis for Safe Diet Advancement: Good Individuals Consulted Consulted and Agree with Results and Recommendations:  Patient;Family member/caregiver  Family Member Consulted: wife  SLP Assessment/Plan Dysphagia  Diagnosis: Mild pharyngeal phase dysphagia Clinical impression: Pt presents with a mild pharyngeal dysphagia  characterized by sensorimotor deficits that impact timing of  swallow sequence and lead to silent penetration of nectar-thick  and thin liquids. A chin tuck was effective in eliminating  penetration with both nectar-thick and thin liquids. Recommend  regular textures with thin liquids with full supervision for  utilization of swallowing compensatory strategies.   Short Term Goals: Week 1: SLP Short Term Goal 1 (Week 1): Pt will utilize  swallowing compensatory strategies with Min verbal cues to  minimize overt s/s of aspiration.  SLP Short Term Goal 2 (Week 1): Pt will consume trials of thin  liquids without overt s/s of aspiration with 50% of trials with  Min verbal cues.  SLP Short Term Goal 3 (Week 1): Pt will utilize call bell to  express wants/needs with Mod verbal and question cues.  SLP Short Term Goal 4 (Week 1): Pt will identify 2 physical and 2  cognitive deficits with Mod contextual, visual and question cues.   SLP Short Term Goal 5 (Week 1): Pt will demonstrate functional  problem solving for basic and familair taks with Mod A verbal and  question cues.  SLP Short Term Goal 6 (Week 1): Pt will attend to LUE during  functional tasks with Mod A verbal and question cues.   General:  Date of Onset: 01/30/13 HPI: 47 year old male with PMH only of hypertension admitted via  ED with CT brain revealed a large right ICH centered around the  BG, with intraventricular extension and 8 mm right to let midline  shift.  Required intubation and was extubated 5/15.  MBS  completed 5/16 and recommended Dys. 3 textures with thin liquids.   Pt transferred to CIR 02/06/13 and has been participating in  dysphagia treatment and is currently on the water protocol. MBS  today to assess for possible upgrade.  Type of Study: Modified Barium Swallowing Study Reason for Referral: Objectively evaluate swallowing function  Previous Swallow Assessment: MBS on 02/02/13 and recommended Dys.  3 textures with honey-thick liquids Diet Prior to this Study: Dysphagia 3 (soft);Honey-thick liquids Temperature Spikes Noted: No Respiratory Status: Room air Length of Intubations (days): 2 days Date extubated: 02/01/13 Behavior/Cognition: Alert;Cooperative;Pleasant mood Oral Cavity - Dentition: Adequate natural dentition Oral Motor / Sensory Function: Impaired - see Bedside swallow  eval Self-Feeding Abilities: Able to feed self Patient Positioning: Upright in bed Baseline Vocal Quality: Clear Volitional Cough: Strong Volitional Swallow: Able to elicit Anatomy: Within functional limits Pharyngeal Secretions: Not observed secondary MBS  Reason for Referral:  Objectively evaluate swallowing function   Oral Phase Oral Preparation/Oral Phase Oral Phase: WFL Pharyngeal Phase  Pharyngeal Phase Pharyngeal Phase: Impaired Pharyngeal - Honey Pharyngeal - Honey Cup: Delayed swallow initiation Pharyngeal - Nectar Pharyngeal - Nectar Cup: Delayed swallow initiation;Reduced  airway/laryngeal closure;Penetration/Aspiration during  swallow;Compensatory strategies attempted (Comment) Penetration/Aspiration details (nectar cup): Material enters  airway, remains ABOVE vocal cords and not ejected out Pharyngeal - Thin Pharyngeal - Thin Cup: Delayed swallow  initiation;Penetration/Aspiration during swallow;Compensatory  strategies attempted (Comment) Penetration/Aspiration details (thin cup): Material enters  airway, remains ABOVE vocal cords and not ejected out Pharyngeal - Solids Pharyngeal - Puree: Delayed swallow initiation Pharyngeal - Mechanical Soft: Delayed swallow initiation Cervical Esophageal Phase  Cervical Esophageal Phase Cervical Esophageal Phase: WFL  PAYNE, COURTNEY 02/13/2013, 4:17 PM     Results for orders placed during the hospital encounter  of 02/06/13 (from the past 72 hour(s))  CREATININE, SERUM     Status: None   Collection Time    02/13/13   5:30 AM      Result Value Range   Creatinine, Ser 0.76  0.50 - 1.35 mg/dL   GFR calc non Af Amer >90  >90 mL/min   GFR calc Af Amer >90  >90 mL/min   Comment:            The eGFR has been calculated     using the CKD EPI equation.     This calculation has not been     validated in all clinical     situations.     eGFR's persistently     <90 mL/min signify     possible Chronic Kidney Disease.     HEENT: normal and no facial droop Cardio: RRR Resp: CTA B/L and unlabored GI: BS positive and non tender Extremity:  Pulses positive and No Edema Skin:   Intact Neuro: Alert/Oriented, Confused, Cranial Nerve II-XII normal, Normal Sensory, Abnormal Motor 4/5 on Left side and Abnormal FMC Ataxic/ dec FMC. Arouses easily. Intact basic insight and awareness Musc/Skel:  Normal Gen NAD   Assessment/Plan: 1. Functional deficits secondary to R ICH which require 3+ hours per day of interdisciplinary therapy in a comprehensive inpatient rehab setting. Physiatrist is providing close team supervision and 24 hour management of active medical problems listed below. Physiatrist and rehab team continue to assess barriers to discharge/monitor patient progress toward functional and medical goals. FIM: FIM - Bathing Bathing Steps Patient Completed: Chest;Right Arm;Left Arm;Abdomen;Front perineal area;Buttocks;Right upper leg;Left upper leg;Left lower leg (including foot);Right lower leg (including foot) Bathing: 4: Steadying assist  FIM - Upper Body Dressing/Undressing Upper body dressing/undressing steps patient completed: Thread/unthread left sleeve of pullover shirt/dress;Put head through opening of pull over shirt/dress;Pull shirt over trunk;Thread/unthread right sleeve of pullover shirt/dresss Upper body dressing/undressing: 5: Set-up assist to: Obtain clothing/put away FIM - Lower Body Dressing/Undressing Lower body dressing/undressing steps patient completed: Thread/unthread right underwear  leg;Thread/unthread left underwear leg;Pull underwear up/down;Thread/unthread right pants leg;Thread/unthread left pants leg;Pull pants up/down;Don/Doff left sock;Don/Doff right sock;Don/Doff right shoe;Don/Doff left shoe Lower body dressing/undressing: 5: Supervision: Safety issues/verbal cues  FIM - Toileting Toileting steps completed by patient: Adjust clothing prior to toileting;Performs perineal hygiene;Adjust clothing after toileting Toileting: 5: Supervision: Safety issues/verbal cues  FIM - Toilet Transfers Toilet Transfers: 5-To toilet/BSC: Supervision (verbal cues/safety issues);5-From toilet/BSC: Supervision (verbal cues/safety issues)  FIM - Press photographer Assistive Devices: Arm rests Bed/Chair Transfer: 5: Bed > Chair or W/C: Supervision (verbal cues/safety issues);5: Chair or W/C > Bed: Supervision (verbal cues/safety issues)  FIM - Locomotion: Wheelchair Locomotion: Wheelchair: 0: Activity did not occur FIM - Locomotion: Ambulation Locomotion: Ambulation Assistive Devices:  (no AD) Ambulation/Gait Assistance: 4: Min guard;5: Supervision Locomotion: Ambulation: 4: Travels 150 ft or more with minimal assistance (Pt.>75%)  Comprehension Comprehension Mode: Auditory Comprehension: 5-Follows basic conversation/direction: With no assist  Expression Expression Mode: Verbal Expression: 5-Expresses basic needs/ideas: With no assist  Social Interaction Social Interaction: 5-Interacts appropriately 90% of the time - Needs monitoring or encouragement for participation or interaction.  Problem Solving Problem Solving: 3-Solves basic 50 - 74% of the time/requires cueing 25 - 49% of the time  Memory Memory: 4-Recognizes or recalls 75 - 89% of the time/requires cueing 10 - 24% of the time   Medical Problem List and Plan:  1. 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 DVT Prophylaxis/Anticoagulation: Subcutaneous Lovenox initiated 02/01/2013. Monitor platelet counts any signs of bleeding  3. Pain Management/headaches. Tramadol 100 mg every 6 hours as needed--is effective. Still present, possibly BP related also 4. Neuropsych: This patient is not capable of making decisions on his/her own behalf.  5. Dysphagia. SLP upgraded to Dysphagia 3 thin liquids. Monitor for any signs of aspiration.   -dc IVF, encourage PO 6. Hypertension with poor control:  Clonidine 0.2 mg increased to tid. Monitor with increased activity. Provide counseling in regards to hypertension. Increase labetalol to 300mg  bid 7. Tobacco/alcohol. NicoDerm patch. Provide counseling LOS (Days) 8 A FACE TO FACE EVALUATION WAS PERFORMED  SWARTZ,ZACHARY T 02/14/2013, 8:10 AM

## 2013-02-14 NOTE — Progress Notes (Signed)
Occupational Therapy Session Note  Patient Details  Name: Rick Williams MRN: [id number] Date of Birth: [birth year]  Today's Date: 02/14/2013 Time: 4098-1191 Time Calculation (min): 43 min  Skilled Therapeutic Interventions/Progress Updates:    Worked on LUE functional use and coordination during session.  Pt able to use washcloth in the left hand to wash the mat and upper cabinet doors with supervision.  Occasional min direction to position his hand on the center of the washcloth.  Also used ROM arc to move rings from the left side of the arc to the right with min facilitation for grasp and mod instructional cueing to maintain visual attention on the ring in his hand.  When pt looks away from the hand he will immediately drop what he is holding onto.  Still with moderate ideomotor apraxia when attempting to manipulate his fingers to grasp the ring as well.  Finished by using connect 4 game to work on visual scanning to the left side and to work on [employer] coordination and motor planning.  Pt needing mod instructional cueing to maintain visual attention and scanning to the left side of the grid.  Pt with multiple drops in checkers from his left hand when attempting to hold them and scan the board.  Pt still with no stated awareness of LUE difficulty when questioned.    Therapy Documentation Precautions:  Precautions Precautions: Fall Precaution Comments: left side inattention; impulsive Restrictions Weight Bearing Restrictions: No  Pain: Pain Assessment Pain Score: 10-Worst pain ever Pain Type: Acute pain Pain Location: Head Pain Descriptors / Indicators: Headache Pain Frequency: Intermittent Pain Onset: Gradual Patients Stated Pain Goal: 3 Pain Intervention(s): Medication (See eMAR)  See FIM for current functional status  Therapy/Group: Individual Therapy  Jovian Lembcke OTR/L 02/14/2013, 2:23 PM

## 2013-02-15 ENCOUNTER — Inpatient Hospital Stay (HOSPITAL_COMMUNITY): Payer: MEDICAID | Admitting: Occupational Therapy

## 2013-02-15 ENCOUNTER — Inpatient Hospital Stay (HOSPITAL_COMMUNITY): Payer: MEDICAID

## 2013-02-15 ENCOUNTER — Inpatient Hospital Stay (HOSPITAL_COMMUNITY): Payer: MEDICAID | Admitting: Speech Pathology

## 2013-02-15 LAB — BASIC METABOLIC PANEL
BUN: 10 mg/dL (ref 6–23)
CO2: 23 mEq/L (ref 19–32)
Calcium: 9.4 mg/dL (ref 8.4–10.5)
Chloride: 100 mEq/L (ref 96–112)
Creatinine, Ser: 0.79 mg/dL (ref 0.50–1.35)
GFR calc Af Amer: >90 mL/min (ref >90)
GFR calc non Af Amer: >90 mL/min (ref >90)
Glucose, Bld: 104 mg/dL — ABNORMAL HIGH (ref 70–99)
Potassium: 3.9 mEq/L (ref 3.5–5.1)
Sodium: 136 mEq/L (ref 135–145)

## 2013-02-15 LAB — CBC
HCT: 39.8 % (ref 39.0–52.0)
Hemoglobin: 13.4 g/dL (ref 13.0–17.0)
MCH: 34 pg (ref 26.0–34.0)
MCHC: 33.7 g/dL (ref 30.0–36.0)
MCV: 101 fL — ABNORMAL HIGH (ref 78.0–100.0)
Platelets: 298 10*3/uL (ref 150–400)
RBC: 3.94 MIL/uL — ABNORMAL LOW (ref 4.22–5.81)
RDW: 12.4 % (ref 11.5–15.5)
WBC: 9 10*3/uL (ref 4.0–10.5)

## 2013-02-15 MED ORDER — OXYCODONE HCL 5 MG PO TABS
5.0000 mg | ORAL_TABLET | Freq: Four times a day (QID) | ORAL | Status: DC | PRN
  Administered 2013-02-15 – 2013-02-17 (×3): 5 mg via ORAL
  Filled 2013-02-15 (×3): qty 1

## 2013-02-15 MED ORDER — HYDROCHLOROTHIAZIDE 25 MG PO TABS
25.0000 mg | ORAL_TABLET | Freq: Every day | ORAL | Status: DC
  Administered 2013-02-15 – 2013-02-17 (×3): 25 mg via ORAL
  Filled 2013-02-15 (×4): qty 1

## 2013-02-15 NOTE — Progress Notes (Signed)
Speech Language Pathology Daily Session Note  Patient Details  Name: Rick Williams MRN: [id number] Date of Birth: 1965/11/04  Today's Date: 02/15/2013 Time: 1415-1450 Time Calculation (min): 35 min  Short Term Goals: Week 1: SLP Short Term Goal 1 (Week 1): Pt will utilize swallowing compensatory strategies with Min verbal cues to minimize overt s/s of aspiration.  SLP Short Term Goal 2 (Week 1): Pt will consume trials of thin liquids without overt s/s of aspiration with 50% of trials with Min verbal cues.  SLP Short Term Goal 3 (Week 1): Pt will utilize call bell to express wants/needs with Mod verbal and question cues.  SLP Short Term Goal 4 (Week 1): Pt will identify 2 physical and 2 cognitive deficits with Mod contextual, visual and question cues.  SLP Short Term Goal 5 (Week 1): Pt will demonstrate functional problem solving for basic and familair taks with Mod A verbal and question cues.  SLP Short Term Goal 6 (Week 1): Pt will attend to LUE during functional tasks with Mod A verbal and question cues.   Skilled Therapeutic Interventions: Skilled treatment session focused on addressing family and patient education.  SLP facilitated session with handout and discussion regarding recommendations for 24/7 supervision with medication, financial management and meals to ensue carryover of safe swallow strategies.  Upon requesting who will be the caregiver to help him with these things patient began to yell and stated "not my wife because she is pissing me off." Wife requested that SLP get Social Investment banker, operational.  After SLP and Social Worker arrived back in room to discuss discharge options, patient became frustrated/agitated and left room SLP followed to ensure safety with mobility and attention to left and patient required Mod verbal cues to identify that door was a fire exit and to not open it.  With time patient was able to calm down and relocated his room with Min assist verbal cues.  Upon  returning to room wife, Child psychotherapist and SLP left room leaving patient with RN.    FIM:  Comprehension Comprehension Mode: Auditory Comprehension: 5-Follows basic conversation/direction: With no assist Expression Expression Mode: Verbal Expression: 5-Expresses complex 90% of the time/cues < 10% of the time Social Interaction Social Interaction: 3-Interacts appropriately 50 - 74% of the time - May be physically or verbally inappropriate. Problem Solving Problem Solving: 4-Solves basic 75 - 89% of the time/requires cueing 10 - 24% of the time Memory Memory: 5-Recognizes or recalls 90% of the time/requires cueing < 10% of the time  Pain Pain Assessment Pain Assessment: No/denies pain  Therapy/Group: Individual Therapy  Charlane Ferretti., CCC-SLP [phone]  Mamie Diiorio 02/15/2013, 4:32 PM

## 2013-02-15 NOTE — Progress Notes (Signed)
Patient ID: Rick Williams, male   DOB: 12-26-1965, 47 y.o.   MRN: [id number] Subjective/Complaints: 47 y.o. 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 Patient independent prior to admission working full-time. Admitted 01/30/2013 of left-sided weakness and altered mental status. Reports the patient had not been taking his blood pressure medications for 3 months. He was found on the floor by his wife. Blood pressure 190/110. 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 Patient was not a TPA candidate secondary to ICH. Patient was placed on the nicardipine drip for blood pressure control. Neurology services followup with conservative care. Followup cranial CT scan 01/31/2013 without change. Subcutaneous Lovenox initiated 02/01/2013 for DVT prophylaxis. NicoDerm patch added for history of tobacco abuse. Patient was maintained on ventilator support for a short time followed by critical care medicine. Blood pressure still with variables and remains on antihypertensive medication. Followup speech therapy for dysphagia and currently maintained on dysphagia 3 honey thick liquids   Headache and sore throat  Review of Systems  HENT: Positive for neck pain.   Musculoskeletal: Positive for joint pain.       Left shoulder pain    Objective: Vital Signs: Blood pressure 170/98, pulse 62, temperature 98.1 F (36.7 C), temperature source Oral, resp. rate 18, height 5\' 10"  (1.778 m), weight 65.046 kg (143 lb 6.4 oz), SpO2 100.00%. No results found. Results for orders placed during the hospital encounter of 02/06/13 (from the past 72 hour(s))  CREATININE, SERUM     Status: None   Collection Time    02/13/13  5:30 AM      Result Value Range   Creatinine, Ser 0.76  0.50 - 1.35 mg/dL   GFR calc non Af Amer >90  >90 mL/min   GFR  calc Af Amer >90  >90 mL/min   Comment:            The eGFR has been calculated     using the CKD EPI equation.     This calculation has not been     validated in all clinical     situations.     eGFR's persistently     <90 mL/min signify     possible Chronic Kidney Disease.  CBC     Status: Abnormal   Collection Time    02/15/13  5:52 AM      Result Value Range   WBC 9.0  4.0 - 10.5 K/uL   RBC 3.94 (*) 4.22 - 5.81 MIL/uL   Hemoglobin 13.4  13.0 - 17.0 g/dL   HCT 40.9  81.1 - 91.4 %   MCV 101.0 (*) 78.0 - 100.0 fL   MCH 34.0  26.0 - 34.0 pg   MCHC 33.7  30.0 - 36.0 g/dL   RDW 78.2  95.6 - 21.3 %   Platelets 298  150 - 400 K/uL  BASIC METABOLIC PANEL     Status: Abnormal   Collection Time    02/15/13  5:52 AM      Result Value Range   Sodium 136  135 - 145 mEq/L   Potassium 3.9  3.5 - 5.1 mEq/L   Chloride 100  96 - 112 mEq/L   CO2 23  19 - 32 mEq/L   Glucose, Bld 104 (*) 70 - 99 mg/dL   BUN 10  6 - 23 mg/dL  Creatinine, Ser 0.79  0.50 - 1.35 mg/dL   Calcium 9.4  8.4 - 16.1 mg/dL   GFR calc non Af Amer >90  >90 mL/min   GFR calc Af Amer >90  >90 mL/min   Comment:            The eGFR has been calculated     using the CKD EPI equation.     This calculation has not been     validated in all clinical     situations.     eGFR's persistently     <90 mL/min signify     possible Chronic Kidney Disease.     HEENT: normal and no facial droop Cardio: RRR Resp: CTA B/L and unlabored GI: BS positive and non tender Extremity:  Pulses positive and No Edema Skin:   Intact Neuro: Alert/Oriented, Confused, Cranial Nerve II-XII normal, Normal Sensory, Abnormal Motor 4/5 on Left side and Abnormal FMC Ataxic/ dec FMC. Arouses easily. Intact basic insight and awareness Musc/Skel:  Normal Gen NAD   Assessment/Plan: 1. Functional deficits secondary to R ICH which require 3+ hours per day of interdisciplinary therapy in a comprehensive inpatient rehab setting. Physiatrist is  providing close team supervision and 24 hour management of active medical problems listed below. Physiatrist and rehab team continue to assess barriers to discharge/monitor patient progress toward functional and medical goals. FIM: FIM - Bathing Bathing Steps Patient Completed: Chest;Right Arm;Left Arm;Abdomen;Front perineal area;Buttocks;Right upper leg;Left upper leg;Right lower leg (including foot);Left lower leg (including foot) Bathing: 5: Supervision: Safety issues/verbal cues  FIM - Upper Body Dressing/Undressing Upper body dressing/undressing steps patient completed: Thread/unthread right sleeve of pullover shirt/dresss;Thread/unthread left sleeve of pullover shirt/dress;Put head through opening of pull over shirt/dress;Pull shirt over trunk Upper body dressing/undressing: 5: Supervision: Safety issues/verbal cues FIM - Lower Body Dressing/Undressing Lower body dressing/undressing steps patient completed: Thread/unthread right pants leg;Pull underwear up/down;Thread/unthread left underwear leg;Thread/unthread right underwear leg;Fasten/unfasten pants;Don/Doff right sock;Don/Doff left sock;Don/Doff right shoe;Don/Doff left shoe;Thread/unthread left pants leg;Pull pants up/down Lower body dressing/undressing: 5: Supervision: Safety issues/verbal cues  FIM - Toileting Toileting steps completed by patient: Adjust clothing prior to toileting;Performs perineal hygiene;Adjust clothing after toileting Toileting Assistive Devices: Grab bar or rail for support Toileting: 5: Supervision: Safety issues/verbal cues  FIM - Archivist Transfers: 5-From toilet/BSC: Supervision (verbal cues/safety issues);5-To toilet/BSC: Supervision (verbal cues/safety issues)  FIM - Press photographer Assistive Devices: Arm rests Bed/Chair Transfer: 0: Activity did not occur  FIM - Locomotion: Wheelchair Locomotion: Wheelchair: 0: Activity did not occur FIM - Locomotion:  Ambulation Locomotion: Ambulation Assistive Devices:  (no AD) Ambulation/Gait Assistance: 5: Supervision Locomotion: Ambulation: 0: Activity did not occur  Comprehension Comprehension Mode: Auditory Comprehension: 5-Follows basic conversation/direction: With no assist  Expression Expression Mode: Verbal Expression: 5-Expresses basic 90% of the time/requires cueing < 10% of the time.  Social Interaction Social Interaction: 5-Interacts appropriately 90% of the time - Needs monitoring or encouragement for participation or interaction.  Problem Solving Problem Solving: 4-Solves basic 75 - 89% of the time/requires cueing 10 - 24% of the time  Memory Memory: 5-Recognizes or recalls 90% of the time/requires cueing < 10% of the time   Medical Problem List and Plan:  1. 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 DVT Prophylaxis/Anticoagulation: Subcutaneous Lovenox initiated 02/01/2013. Monitor platelet counts any signs of bleeding  3. Pain Management/headaches.   -oxycodone for headaches in place of tramadol 4.  Neuropsych: This patient is not capable of making decisions on his/her own behalf.  5. Dysphagia. SLP upgraded to Dysphagia 3 thin liquids. Monitor for any signs of aspiration.   -off of IVF, encourage PO 6. Hypertension with poor control:  Clonidine 0.2 mg increased to tid. Monitor with increased activity. Provide counseling in regards to hypertension. Increased labetalol to 300mg  bid  -begin HCTZ today, consider hydralazine---whatever we choose, he has to be able to have access to as an outpt. 7. Tobacco/alcohol. NicoDerm patch. Provide counseling LOS (Days) 9 A FACE TO FACE EVALUATION WAS PERFORMED  SWARTZ,ZACHARY T 02/15/2013, 9:43 AM

## 2013-02-15 NOTE — Progress Notes (Addendum)
Occupational Therapy Session Note  Patient Details  Name: Rick Williams MRN: [id number] Date of Birth: 08/24/1966  Today's Date: 02/15/2013 Time: 0905-1000 and 1100-1130 Time Calculation (min): 55 min and 30 min  Short Term Goals: Week 1:  OT Short Term Goal 1 (Week 1): Pt will don LB clothing with supervision.   OT Short Term Goal 1 - Progress (Week 1): Met OT Short Term Goal 2 (Week 1): Pt will bathe with supervision. OT Short Term Goal 2 - Progress (Week 1): Met OT Short Term Goal 3 (Week 1): Pt will ambulate to toilet with supervision. OT Short Term Goal 3 - Progress (Week 1): Met OT Short Term Goal 4 (Week 1): Pt will demonstrate improved LUE coordination by opening toothpaste and other small containers with minimal difficulty. OT Short Term Goal 4 - Progress (Week 1): Met OT Short Term Goal 5 (Week 1): Pt will demonstrate improved left side awareness by placing his legs in wide BOS when reaching to floor level. OT Short Term Goal 5 - Progress (Week 1): Progressing toward goal Week 2:  OT Short Term Goal 1 (Week 2): STGs=LTGs  Skilled Therapeutic Interventions/Progress Updates:    Visit 1:  Pt scheduled for B/D this am, but he declined bathing.  Pt's wife present for family education today. Pt did complete all of his grooming and dressing with close supervision and extra time.  He was able to don Ted hose using left hand to assist 50% of time with cues to fully pinch fingers together to hold sock. Pt ambulated around room at a fast rate, but did not lose his balance. When going through doorways, he bumped his left shoulder 2x due to left inattention. Pt was not concerned about this.  He continually stated that he would just meet me in the gym.  When reminded that he is not safe to walk by himself, he shrugged his shoulders.  Pt practiced tub bench transfers with supervision. He then went to kitchen to locate items needed to make a peanut butter sandwich. Max cues to look to left to  identify items.  Session time was over so pt walked back to room to meet the physical therapist  Visit 2: Pt stated that his head was still hurting, but less than 2 hours ago.  He had been given pain medication.  Pt ambulated to kitchen to make the sandwich with mod assist to use left hand to stabilize plate and PB jar.  Pt continues to demonstrate left inattention and would not be safe to do any cooking tasks that require sharp instruments or heat.  Overall mod assist with simple meal prep and house keeping.  Pt then worked BUE strengthening with 4# dowel bar.  Pt walked back to room and went to bed to rest. Bed alarm set.   Therapy Documentation Precautions:  Precautions Precautions: Fall Precaution Comments: left side inattention; impulsive Restrictions Weight Bearing Restrictions: No Vital Signs: Therapy Vitals Pulse Rate: 62 BP: 170/98 mmHg Pain: Pain Assessment Pain Assessment: 0-10 Pain Score:   9 Pain Type: Acute pain Pain Location: Head Pain Descriptors / Indicators: Headache Pain Intervention(s): RN made aware ADL:  See FIM for current functional status  Therapy/Group: Individual Therapy  Aiman Sonn 02/15/2013, 10:24 AM

## 2013-02-15 NOTE — Progress Notes (Signed)
Social Work Patient ID: Rick Williams, male   DOB: Jun 19, 1966, 47 y.o.   MRN: [id number] Met with pt and husband along with Melissa-SPT regarding the way pt is treating wife.  Wife reports: " He is pushing me away where he doesn't the others here." Pt reports he is angry because this am he looked over and she was not there.  He feels she is lying to him and wants her to go and stay away.  Encouraged wife to go home and Take a break.  Both need a cooling off time.  Wife reports he has been treating her like this since he came into the hospital.  Talked with pt's dad via telephone regarding pt going home with them. He will talk with wife and let pt know.  He reports: " We will work something out."  Pt seems to have much anger toward wife.  Wife aware to keep herself safe.  Appears pt's anger and frustration from Having stroke and deficits coming out at wife.  He reports he will have someone with him and is ready to throw away his marriage of eight years.  Wife wants the best for him and to stay safe, she does Know she can not prevent him from smoking or drinking if this is what he wants to do.  Pt is not a NH candidate due to too high level-supervision level and no insurance.  Will let both cool off and talk with pt in am. Wife's son to transport home and she plans to stay there until Sat when she will transport pt home on his discharge date.

## 2013-02-15 NOTE — Progress Notes (Signed)
Physical Therapy Session Note  Patient Details  Name: Rick Williams MRN: [id number] Date of Birth: 29-Sep-1965  Today's Date: 02/15/2013 Time: 1000-1100 Time Calculation (min): 60 min  Short Term Goals: Week 1:  PT Short Term Goal 1 (Week 1): STG's = LTG's due to LOS Week 2: STGs = LTGs    Skilled Therapeutic Interventions/Progress Updates:  Wife present for family education.  Wife return demonstrated simulated car transfer, gait on level tile and carpet, gait in congested area, gait up/down 5 steps, floor transfer, strategies for pt's L inattention.    Pt does not take cues and reminders well from his wife.  Therapist discussed pt's L inattention and fall risk with pt, but he has poor awareness of his safety risks.  Pt c/o HA, rated 10/10.  BP = 92/66; HR 64.   Car transfer x 2, supervision, safety concerns due to LLE and LUE inattention.  Pt used bil UEs to fasten seat belt, with constant cueing to attend to L hand.  Floor transfer x 2 to heavy armchair, safety concerns due to attempting to pull up on arms of chair.  Pt's L slipper came off of his heel and was twisting underneath his foot as he attempted to get up from floor; pt was unaware.  Discussed safe footwear with pt and his wife.  Toilet transfer to stand to urinate, supervision for safety.  Gait x 200' on tile and carpet and up/down 5 steps with 1 rail R as per home situation, with supervision.    High level gait ambulating through obstacle course of chairs; pt impulsive, encountering 3/6 chairs on his L, x 2 trials; simultaneous cognitive task while ambulating on level tile: visually scanning to L to read out room numbers on L.  Pt missed first room, but stated next 5 room numbers correctnly.  During route- finding when returning to room, pt missed a turn to L even with cueing, insisting that he was in the correct hall while heading for 4700.    neuromuscular re-education via demo, VCs for LLE during Fall Prevention 1  exs: bil calf raises, modified R and L tandem mini squats, open chain hip abduction, bil toe raises with bil fading to 1 HHA; on compliant Airex mat- marching and attempted SLS with bil HHA.  With external perturbations on level tile, pt demonstrated L ankle and hip strategies; inadequate stepping strategy.   Pt returned to bed to lie down, stating this is the only thing that relieves his HA. All needs left within pt's reach.    Therapy Documentation Precautions:  Precautions Precautions: Fall Precaution Comments: left side inattention; impulsive Restrictions Weight Bearing Restrictions: No   Vital Signs: Therapy Vitals Pulse Rate: 73 BP: 92/66 mmHg   Locomotion : Ambulation Ambulation/Gait Assistance: 5: Supervision       See FIM for current functional status  Therapy/Group: Individual Therapy  Murdock Jellison 02/15/2013, 2:52 PM

## 2013-02-16 ENCOUNTER — Inpatient Hospital Stay (HOSPITAL_COMMUNITY): Payer: MEDICAID | Admitting: Occupational Therapy

## 2013-02-16 ENCOUNTER — Inpatient Hospital Stay (HOSPITAL_COMMUNITY): Payer: Self-pay

## 2013-02-16 ENCOUNTER — Inpatient Hospital Stay (HOSPITAL_COMMUNITY): Payer: Self-pay | Admitting: Speech Pathology

## 2013-02-16 DIAGNOSIS — I619 Nontraumatic intracerebral hemorrhage, unspecified: Secondary | ICD-10-CM

## 2013-02-16 DIAGNOSIS — I10 Essential (primary) hypertension: Secondary | ICD-10-CM

## 2013-02-16 MED ORDER — LABETALOL HCL 300 MG PO TABS: 300.0000 mg | ORAL_TABLET | Freq: Three times a day (TID) | ORAL | Status: DC

## 2013-02-16 MED ORDER — CLONIDINE HCL 0.2 MG PO TABS: 0.2000 mg | ORAL_TABLET | Freq: Three times a day (TID) | ORAL | Status: DC

## 2013-02-16 MED ORDER — HYDROCHLOROTHIAZIDE 25 MG PO TABS: 25.0000 mg | ORAL_TABLET | Freq: Every day | ORAL | Status: DC

## 2013-02-16 MED ORDER — OXYCODONE HCL 5 MG PO TABS: 5.0000 mg | ORAL_TABLET | Freq: Four times a day (QID) | ORAL | Status: DC | PRN

## 2013-02-16 MED ORDER — PROSIGHT PO TABS: 1.0000 | ORAL_TABLET | Freq: Every day | ORAL | Status: DC

## 2013-02-16 MED ORDER — NICOTINE 14 MG/24HR TD PT24: 1.0000 | MEDICATED_PATCH | Freq: Every day | TRANSDERMAL | Status: DC

## 2013-02-16 NOTE — Discharge Summary (Signed)
Rick Williams, Rick Williams NO.:  [id number]  MEDICAL RECORD NO.:  [id number]  LOCATION:  4151                         FACILITY:  [facility]  PHYSICIAN:  Erick Colace, M.D.DATE OF BIRTH:  [birth year]  DATE OF ADMISSION:  02/06/2013 DATE OF DISCHARGE:  02/17/2013                              DISCHARGE SUMMARY   DISCHARGE DIAGNOSES: 1. Left right basal ganglia intracerebral hemorrhage, felt to be due     to malignant hypertension. 2. Subcutaneous Lovenox for DVT prophylaxis. 3. Dysphagia. 4. Hypertension. 5. Tobacco and alcohol abuse.  HISTORY OF PRESENT ILLNESS:  This is a 47 year old right-handed male with history of hypertension and alcohol use as well as CVA without apparent residual deficits.  He was independent prior to admission working full time.  Patient admitted on Jan 30, 2013 with left-sided weakness and altered mental status.  Reports that the patient had not been taking his blood pressure pills for the last 3 months.  He was found on the floor by his wife.  Blood pressure was 190/110.  Emergent cranial CT scan showed large right ICH centered around both basal ganglia with intraventricular extension and 8 mm right to left midline shift.  The patient was not a tPA candidate secondary to ICH.  Placed on nicardipine drip for blood pressure control.  Neurology Services advised conservative care.  Followup cranial CT scan without change. Subcutaneous Lovenox initiated on Feb 01, 2013 for DVT prophylaxis. Placed on a NicoDerm patch for tobacco abuse.  Maintained on the ventilator for a short time for critical care medicine.  Blood pressures with some variables and monitored.  Followup speech therapy maintained on dysphagia 3 honey thick liquid diet.  Physical and occupational therapy ongoing.  Patient was admitted for comprehensive rehab program.  PAST MEDICAL HISTORY:  See discharge diagnoses.  SOCIAL HISTORY:  Lives with spouse.  FUNCTIONAL HISTORY:   Prior to admission, independent working full time. Functional status upon admission to rehab services was moderate assist ambulate 78 feet with a rolling walker.  PHYSICAL EXAMINATION:  VITAL SIGNS:  Blood pressure 147/87, pulse 59, temperature 97, respirations 18. GENERAL:  This was an alert male, oriented x3. HEENT:  Pupils reactive to light.  He had some decreased awareness of left-sided weakness and was impulsive. LUNGS:  Clear to auscultation. CARDIAC:  Regular rate and rhythm. ABDOMEN:  Soft, nontender.  Good bowel sounds. NEURO:  Speech was mildly dysarthric, but intelligible.  REHABILITATION HOSPITAL COURSE:  The patient was admitted to inpatient rehab services with therapies initiated on a 3-hour daily basis consisting of physical therapy, occupational therapy, speech therapy, and rehabilitation nursing.  The following issues were addressed during the patient's rehabilitation stay.  Pertaining to Mr. Hengel, right basal ganglia ICH remained stable, monitored closely by Neurology Services with monitoring of blood pressures.  He remained on a clonidine 0.2 mg 3 times daily, hydrochlorothiazide 25 mg daily as well as labetalol 300 mg 3 times daily, and stressed the need to maintain antihypertensive medications as the patient quit taking his medications in the past.  His diet was slowly advanced to a regular consistency which he tolerated nicely.  He did have a history of tobacco abuse.  He was weaned  from his NicoDerm patch.  The patient received weekly collaborative interdisciplinary team conferences to discuss estimated length of stay, family teaching, and any barriers to discharge.  He was supervision steady assist for functional transfers and activities of daily living, impulsive required cues for safety.  Decreased proprioception and coordination noted in left upper extremity. Supervision, minimal assist overall for mobility.  He was able to communicate his needs.  It  was stressed the need for 24-hour supervision by family which his wife could provide.  All issues in regard to supervision maintaining his medications were discussed with patient's family.  He was discharged to home after family teaching.  DISCHARGE MEDICATIONS:  At the time of dictation included clonidine 0.2 mg t.i.d., hydrochlorothiazide 25 mg p.o. daily, labetalol 300 mg p.o. t.i.d., multivitamin daily, NicoDerm patch taper as advised, oxycodone immediate release 5 mg every 6 hours as needed for pain dispensed 60 tablets.  DIET:  Regular.  SPECIAL INSTRUCTIONS:  The patient would follow up with Dr. Claudette Laws at the outpatient rehab center on March 12, 2013, Dr. Delia Heady of Neurology Services in one month, call for appointment.  Case management making arrangements for a PCP which the patient did not follow up with in the past.  The patient was advised no driving, no alcohol, no smoking.     Mariam Dollar, P.A.   ______________________________ Erick Colace, M.D.    DA/MEDQ  D:  02/16/2013  T:  02/16/2013  Job:  [id number]  cc:   Pramod P. Pearlean Brownie, MD

## 2013-02-16 NOTE — Progress Notes (Signed)
Occupational Therapy Session Note  Patient Details  Name: Rick Williams MRN: [id number] Date of Birth: 12-17-1965  Today's Date: 02/16/2013 Time: 0804-0905 Time Calculation (min): 61 min  Short Term Goals: Week 1:  OT Short Term Goal 1 (Week 1): Pt will don LB clothing with supervision.   OT Short Term Goal 1 - Progress (Week 1): Met OT Short Term Goal 2 (Week 1): Pt will bathe with supervision. OT Short Term Goal 2 - Progress (Week 1): Met OT Short Term Goal 3 (Week 1): Pt will ambulate to toilet with supervision. OT Short Term Goal 3 - Progress (Week 1): Met OT Short Term Goal 4 (Week 1): Pt will demonstrate improved LUE coordination by opening toothpaste and other small containers with minimal difficulty. OT Short Term Goal 4 - Progress (Week 1): Met OT Short Term Goal 5 (Week 1): Pt will demonstrate improved left side awareness by placing his legs in wide BOS when reaching to floor level. OT Short Term Goal 5 - Progress (Week 1): Progressing toward goal Week 2:  OT Short Term Goal 1 (Week 2): STGs=LTGs  Skilled Therapeutic Interventions/Progress Updates:      Pt seen for BADL retraining of toileting, bathing, and dressing with a focus on safety awareness, use of LUE, left side awareness and balance.  Pt continues to need frequent cuing as he continues to demonstrate unsafe behaviors such as standing up to don/doff pants over feet, standing to dry off feet, reaching far outside of his BOS.  He continues to have severe left motor impersistence.  He frequently attempts to pick up items with his left hand but drops them instantly.  Overall, he needs supervision and cuing with his self care. Family ed with his wife was completed yesterday. She is aware of the need for 24/7 supervision.  Pt will be discharging to home in the morning.  Therapy Documentation Precautions:  Precautions Precautions: Fall Precaution Comments: left side inattention; impulsive Restrictions Weight Bearing  Restrictions: No     Pain:  4/10 headache, premedicated ADL:  See FIM for current functional status  Therapy/Group: Individual Therapy  Ophie Burrowes 02/16/2013, 10:23 AM

## 2013-02-16 NOTE — Social Work (Signed)
Met with pt to inform of Match program also contacted wife and left message to explalin to her how the program works. Also gave to Judy-RN who will be here tomorrow to give to person who picks up pt to transport home and explained to her how to explain to person.  Have left my cell and can contact myself regarding questions or concerns tomorrow at discharge.

## 2013-02-16 NOTE — Plan of Care (Signed)
Problem: RH Bed to Chair Transfers Goal: LTG Patient will perform bed/chair transfers w/assist (PT) LTG: Patient will perform bed/chair transfers with assistance, with/without cues (PT).  Outcome: Not Met (add Reason) Patient requires supervision for safety due to left inattention.

## 2013-02-16 NOTE — Discharge Summary (Signed)
  Discharge summary job [id number]

## 2013-02-16 NOTE — Progress Notes (Signed)
Occupational Therapy Discharge Summary  Patient Details  Name: Rick Williams MRN: [id number] Date of Birth: 1965-10-12  Today's Date: 02/16/2013    Patient has met 9 of 12 long term goals due to improved balance, ability to compensate for deficits, functional use of  LEFT upper and LEFT lower extremity and improved coordination.  Patient to discharge at overall Supervision level.  Patient's care partner is independent to provide the necessary physical and cognitive assistance at discharge.    Reasons goals not met: Pt did not meet goal of using his left hand as a nondominant assist.  He is using it as a gross assist, but he does not yet have the coordination or left side awareness to use it at a higher level yet. Outpt OT is recommended to address this.  He also had 2 goals set at a min assist level (simple meal prep and light housekeeping). At this time he requires mod assist due to poor left arm awareness and coordination.  It is too dangerous for him to do any tasks around heat, sharp objects, or cleaning chemicals at this time.  Recommendation:  Patient will benefit from ongoing skilled OT services in outpatient setting to continue to advance functional skills in the area of BADL, iADL and Vocation.  Equipment: transfer tub bench  Reasons for discharge: treatment goals met  Patient/family agrees with progress made and goals achieved: Yes  OT Discharge ADL   supervision with basic self care and ADL transfers, mod assist with higher level IADLs of cooking and cleaning Vision/Perception  Vision - Assessment Ocular Range of Motion: Within Functional Limits Tracking/Visual Pursuits: Able to track stimulus in all quads without difficulty Saccades: Within functional limits Visual Fields: No apparent deficits Perception Perception: Impaired Inattention/Neglect: Does not attend to left side of body;Does not attend to left visual field Spatial Orientation: Pt continues to drop items  out of left hand and bump his left shoulder into doorways. Praxis Praxis: Intact Praxis-Other Comments: Praxis has improved from min impaired to functional, although pt needs extra time for processing/ problem solving  Cognition Overall Cognitive Status: Impaired/Different from baseline Memory Impairment: Decreased recall of new information;Decreased short term memory Awareness: Impaired Awareness Impairment: Intellectual impairment Problem Solving: Impaired Problem Solving Impairment: Verbal basic;Functional basic Behaviors: Impulsive Sensation Sensation Light Touch: Appears Intact Stereognosis: Appears Intact Hot/Cold: Appears Intact Proprioception: Appears Intact Additional Comments: sensation is intact, but poor awareness of LUE Coordination Gross Motor Movements are Fluid and Coordinated: No Fine Motor Movements are Fluid and Coordinated: No Coordination and Movement Description: impaired in LUE/LLE Finger Nose Finger Test: impaired in LUE, dysmetria, very slow to reach finger to nose Motor  Motor Motor: Hemiplegia;Motor impersistence Mobility    supervision and cues for left side awareness with transfers and ambulation Trunk/Postural Assessment  Cervical Assessment Cervical Assessment: Within Functional Limits Thoracic Assessment Thoracic Assessment: Within Functional Limits Lumbar Assessment Lumbar Assessment: Within Functional Limits Postural Control Postural Control: Within Functional Limits  Balance Static Sitting Balance Static Sitting - Level of Assistance: 7: Independent Dynamic Sitting Balance Dynamic Sitting - Level of Assistance: 7: Independent Static Standing Balance Static Standing - Level of Assistance: 5: Stand by assistance Dynamic Standing Balance Dynamic Standing - Level of Assistance: 5: Stand by assistance Extremity/Trunk Assessment RUE Assessment RUE Assessment: Within Functional Limits LUE Assessment LUE Assessment: Exceptions to WFL LUE  AROM (degrees) Overall AROM Left Upper Extremity: Within functional limits for tasks assessed LUE Strength Left Shoulder Flexion: 4/5 Left Elbow Flexion: 4/5 Gross Grasp:  Impaired (arom WFL, impaired coordination) Grip (lbs): 32 lbs (vs 90 on right) Lateral Pinch:  (impaired, 2/5) 3 Point Pinch:  (impaired, 2/5)  See FIM for current functional status  SAGUIER,JULIA 02/16/2013, 8:47 AM

## 2013-02-16 NOTE — Progress Notes (Signed)
SLP Cancellation Note  Patient Details Name: Rick Williams MRN: [id number] DOB: 1966/05/21   Cancelled treatment:        Patient missed 45 minutes of skilled SLP services due to refusal to participate.  SLP completed education with wife yesterday; however, patient stated that he is no longer going home with wife.  No other caregiver present to educate regarding patient's defiicts and need for 24/7 Supervision.  SLP briefly reinformced recommendations for 24/7 to assist with left attention, safety awareness, carryover of chin tuck with p.o. as well as medication and finanical management tasks; patient nodded head in response.    Fae Pippin, M.A., CCC-SLP [phone]  [name] 02/16/2013, 2:18 PM

## 2013-02-16 NOTE — Progress Notes (Signed)
Social Work Patient ID: Rick Williams, male   DOB: 20-Mar-1966, 47 y.o.   MRN: [id number] Met with pt to discuss discharge tomorrow.  He is unsure if going home with wife.  This worker did speak with pt's father yesterday and he assured worker pt will have someone with Him.  Both are aware of 24 hour supervision level recommendation.  Nephew is coming up later today.  Pt states: " I will have someone with me, you all worry too much."  Pt aware of  Recommendation of not smoking or drinking when he is discharged.  He wants to smoke and plans to on the way home from the hospital.  Pt is considered competent according to MD,so can Make own decisions although they may no be the best ones for him or health wise.  Have set up outpatient therapies and pt to follow up with [facility] clinic in [place] on Monday.  Wife also aware. Unsure where pt is going tomorrow.  Will also call wife to see her plans.

## 2013-02-16 NOTE — Progress Notes (Signed)
Occupational Therapy Session Note  Patient Details  Name: Rick Williams MRN: [id number] Date of Birth: [birth year]  Today's Date: 02/16/2013 Time: 1300-1330 Time Calculation (min): 30 min  Short Term Goals: Week 2:  OT Short Term Goal 1 (Week 2): STGs=LTGs  Skilled Therapeutic Interventions/Progress Updates:  Upon arrival patient alone in bathroom with door closed and toilet flushed.  Upon entering bathroom, patient trying to pull up pants and fasten zipper, button and belt.  After attempts to complete this task while in the bathroom, patient ambulated with supervision to sink where he continued to attempt to complete task and was only successful with verbal cues to watch his left hand while it was working.  Patient has adequate strength and AROM in left hand to use it to assist with fastening pants he has significant left neglect/innattention which limits his ability to be successful.  Engaged in forced use of LUE for several functional tasks, open doors, open and close kitchen cabinets, pick up item off floor (dropped items 3 times because he took his eyes off his hand), crumble paper towel and toss into trash can (required 2 attempts), retreive glass from cabinet and once full of water, trying not to let the glass tip or fall (tipped 5-6 times and fell 2 times) and worked on controlling slow pouring out of the water.  Patient assisted with emptying dishwasher while using his left hand.  Ambulated back to his room and bed alarm engaged.  RN notified that patient up to bathroom without assistance.  Therapy Documentation Precautions:  Precautions Precautions: Fall Precaution Comments: left side inattention Restrictions Weight Bearing Restrictions: No Pain: Denies pain  Therapy/Group: Individual Therapy  Sandara Tyree 02/16/2013, 4:10 PM

## 2013-02-16 NOTE — Progress Notes (Signed)
Physical Therapy Discharge Summary  Patient Details  Name: Rick Williams MRN: [id number] Date of Birth: 30-Dec-1965  Today's Date: 02/16/2013 Time: 1100-1155 Time Calculation (min): 55 min  Patient has met 7 of 8 long term goals due to improved activity tolerance, improved balance, improved postural control, increased strength, decreased pain, functional use of  left lower extremity, improved attention and improved awareness.  Patient to discharge at an ambulatory level Supervision.   Patient's care partner is independent to provide the necessary physical assistance at discharge.  Treatment session consisted of grad day activities. Patient performed car transfer and floor transfer with supervision. Patient ambulated up and down 12 steps with railing on right with supervision/ cueing to attend to left. Patient repeated BERG and DGI balance testing (results below). Patient ambulated outside on uneven/incline surfaces with supervision to attend to left. Patient reported having no further questions regarding discharge. Patient left in room resting in bed.  Reasons goals not met: patient continues to need supervision with transfers due to left inattention.  Recommendation:  Patient will benefit from ongoing skilled PT services in outpatient setting to continue to advance safe functional mobility, address ongoing impairments in left inattention, and minimize fall risk.  Equipment: No equipment provided  Reasons for discharge: treatment goals met and discharge from hospital  Patient/family agrees with progress made and goals achieved: Yes  PT Discharge Precautions/Restrictions Precautions Precautions: Fall Precaution Comments: left side inattention Restrictions Weight Bearing Restrictions: No Pain Pain Assessment Pain Assessment: 0-10 Pain Score:   7 Pain Location: Head Pain Orientation: Anterior Pain Descriptors / Indicators: Headache Vision/Perception  Vision - History Baseline  Vision: No visual deficits Visual History: Glaucoma Vision - Assessment Ocular Range of Motion: Within Functional Limits Tracking/Visual Pursuits: Able to track stimulus in all quads without difficulty Saccades: Within functional limits Visual Fields: No apparent deficits Perception Perception: Impaired Inattention/Neglect: Does not attend to left visual field Spatial Orientation: Pt continues to drop items out of left hand and bump his left shoulder into doorways. Praxis Praxis: Intact Praxis-Other Comments: Praxis has improved from min impaired to functional, although pt needs extra time for processing/ problem solving  Cognition Overall Cognitive Status: Impaired/Different from baseline Arousal/Alertness: Awake/alert Orientation Level: Oriented X4 Memory Impairment: Decreased recall of new information;Decreased short term memory Awareness: Impaired Awareness Impairment: Intellectual impairment Problem Solving: Impaired Problem Solving Impairment: Verbal basic;Functional basic Behaviors: Impulsive Sensation Sensation Light Touch: Appears Intact Stereognosis: Not tested Hot/Cold: Not tested Proprioception: Not tested Additional Comments: sensation is intact, but poor awareness of LUE Coordination Gross Motor Movements are Fluid and Coordinated: No Fine Motor Movements are Fluid and Coordinated: No Coordination and Movement Description: impaired in LUE/LLE Finger Nose Finger Test: impaired in LUE, dysmetria, very slow to reach finger to nose Motor  Motor Motor: Hemiplegia  Mobility Bed Mobility Supine to Sit: 6: Modified independent (Device/Increase time) Sitting - Scoot to Edge of Bed: 6: Modified independent (Device/Increase time) Sit to Supine: 6: Modified independent (Device/Increase time) Transfers Sit to Stand: 5: Supervision Stand to Sit: 5: Supervision Locomotion  Ambulation Ambulation: Yes Ambulation/Gait Assistance: 5: Supervision Ambulation Distance  (Feet): 500 Feet Assistive device: None Ambulation/Gait Assistance Details: Patient with inattention to left and requires verbal cues to attend to obstacles on the left.  Gait Gait Pattern: Step-through pattern;Narrow base of support;Left foot flat;Decreased hip/knee flexion - left;Trunk rotated posteriorly on right High Level Ambulation High Level Ambulation: Side stepping;Backwards walking;Direction changes;Sudden stops;Head turns Side Stepping: x 10 feet each direction wit supervision Backwards Walking: x  20 feet with supervision Direction Changes: supervision with direction changes Sudden Stops: supervision with sudden stops Head Turns: x200' with horizontal/vertical head turns, able to maintain gait speed, requires supervision Stairs / Additional Locomotion Stairs: Yes Stairs Assistance: 5: Supervision Stair Management Technique: One rail Right;Alternating pattern;Forwards Number of Stairs: 12  Trunk/Postural Assessment  Cervical Assessment Cervical Assessment: Within Functional Limits Thoracic Assessment Thoracic Assessment: Within Functional Limits Lumbar Assessment Lumbar Assessment: Within Functional Limits Postural Control Postural Control: Within Functional Limits  Balance Standardized Balance Assessment Standardized Balance Assessment: Berg Balance Test;Dynamic Gait Index Berg Balance Test Sit to Stand: Able to stand without using hands and stabilize independently Standing Unsupported: Able to stand safely 2 minutes Sitting with Back Unsupported but Feet Supported on Floor or Stool: Able to sit safely and securely 2 minutes Stand to Sit: Sits safely with minimal use of hands Transfers: Able to transfer safely, minor use of hands Standing Unsupported with Eyes Closed: Able to stand 10 seconds safely Standing Ubsupported with Feet Together: Able to place feet together independently and stand 1 minute safely From Standing, Reach Forward with Outstretched Arm: Can reach  confidently >25 cm (10") From Standing Position, Pick up Object from Floor: Able to pick up shoe safely and easily From Standing Position, Turn to Look Behind Over each Shoulder: Looks behind from both sides and weight shifts well Turn 360 Degrees: Able to turn 360 degrees safely in 4 seconds or less Standing Unsupported, Alternately Place Feet on Step/Stool: Able to stand independently and safely and complete 8 steps in 20 seconds Standing Unsupported, One Foot in Front: Needs help to step but can hold 15 seconds Standing on One Leg: Able to lift leg independently and hold 5-10 seconds Total Score: 52 Dynamic Gait Index Level Surface: Mild Impairment Change in Gait Speed: Mild Impairment Gait with Horizontal Head Turns: Mild Impairment Gait with Vertical Head Turns: Mild Impairment Gait and Pivot Turn: Normal Step Over Obstacle: Normal Step Around Obstacles: Mild Impairment Steps: Mild Impairment Total Score: 18 Static Sitting Balance Static Sitting - Level of Assistance: 7: Independent Dynamic Sitting Balance Dynamic Sitting - Level of Assistance: 7: Independent Static Standing Balance Static Standing - Level of Assistance: 5: Stand by assistance Dynamic Standing Balance Dynamic Standing - Level of Assistance: 5: Stand by assistance Extremity Assessment  RUE Assessment RUE Assessment: Within Functional Limits LUE Assessment LUE Assessment: Exceptions to WFL LUE AROM (degrees) Overall AROM Left Upper Extremity: Within functional limits for tasks assessed LUE Strength Left Shoulder Flexion: 4/5 Left Elbow Flexion: 4/5 Gross Grasp: Impaired (arom WFL, impaired coordination) Grip (lbs): 32 lbs (vs 90 on right) Lateral Pinch:  (impaired, 2/5) 3 Point Pinch:  (impaired, 2/5) RLE Assessment RLE Assessment: Within Functional Limits LLE Assessment LLE Assessment: Within Functional Limits  See FIM for current functional status  Arelia Longest M 02/16/2013, 12:14 PM

## 2013-02-16 NOTE — Progress Notes (Signed)
Social Work Discharge Note Discharge Note  The overall goal for the admission was met for:   Discharge location: Yes-HOME WITH WIFE OR FATHER  Length of Stay: Yes-11 DAYS  Discharge activity level: Yes-SUPERVISION LEVEL  Home/community participation: Yes  Services provided included: MD, RD, PT, OT, SLP, RN, Pharmacy and SW  Financial Services: Other: PENDING MEDICAID  Follow-up services arranged: Outpatient:  [facility],OT,SPT 6/2 1;45-3;15 6/3 4;15 SPT and DME: [facility]  Comments (or additional information):PT/FAMILY AWARE OF 24 HR SUPERVISION RECOMMENDATION-WILL HAVE ACCORDING TO FAMILY. PT/WIFE TO FOLLOW UP WITH [facility] IN [place] INFO GIVEN TO WIFE.  MATCH PROGRAM EXPLAINED TO PT/WIFE FOR ASSISTANCE WITH MEDS FOLLOW UP WITH [employer] AND MEDICAID APPLICATION STARTED IN HOSPITAL.  PT IS COMPETENT AND WILL DO WHAT HE WANTS TO DO-AWARE OF RECOMMENDATION NOT TO RESUME SMOKING OR DRINKING.  REFUSED SUBSTANCE ABUSE RESOURCES  Patient/Family verbalized understanding of follow-up arrangements: Yes  Individual responsible for coordination of the follow-up plan: SELF & KAREN-WIFE  Confirmed correct DME delivered: Lucy Chris 02/16/2013    Lucy Chris

## 2013-02-16 NOTE — Progress Notes (Signed)
Speech Language Pathology Discharge Summary  Patient Details  Name: Rick Williams MRN: [id number] Date of Birth: 1966-08-25  Today's Date: 02/16/2013  Patient has met 5 of 5 long term goals.  Patient to discharge at overall Min;Supervision level.  Reasons goals not met: n/a   Clinical Impression/Discharge Summary: Patient met 5 out of 5 long term goals during CIR stay due to gains in swallow function and left attention; however, patient continues to require Supervision level verbal cues for safety due to decreased awareness, recall and carryover.  Patient also requires Min assist with cognitive tasks such as management of finances and medications and safe home planning.  As a result 24/7 supervision is recommended and wife has completed family education; patient stated that he is no longer going home with wife. No other caregiver present to educate regarding patient's deficits and need for 24/7 Supervision. SLP briefly reinforced recommendations for 24/7 to assist with left attention, safety awareness, carryover of chin tuck with p.o. as well as medication and financial management tasks; patient nodded head in response.    Care Partner:  Caregiver Able to Provide Assistance:  (unknown)  Type of Caregiver Assistance: Cognitive  Recommendation:  Outpatient SLP;24 hour supervision/assistance  Rationale for SLP Follow Up: Maximize cognitive function and independence;Maximize swallowing safety   Equipment: none   Reasons for discharge: Treatment goals met;Discharged from hospital   Patient/Family Agrees with Progress Made and Goals Achieved: Yes   See FIM for current functional status  Charlane Ferretti., CCC-SLP [phone]  Rick Williams 02/16/2013, 2:29 PM

## 2013-02-16 NOTE — Progress Notes (Signed)
Patient ID: Rick Williams, male   DOB: 03/09/1966, 47 y.o.   MRN: [id number] Subjective/Complaints: 47 y.o. 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 Patient independent prior to admission working full-time. Admitted 01/30/2013 of left-sided weakness and altered mental status. Reports the patient had not been taking his blood pressure medications for 3 months. He was found on the floor by his wife. Blood pressure 190/110. 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 Patient was not a TPA candidate secondary to ICH. Patient was placed on the nicardipine drip for blood pressure control. Neurology services followup with conservative care. Followup cranial CT scan 01/31/2013 without change. Subcutaneous Lovenox initiated 02/01/2013 for DVT prophylaxis. NicoDerm patch added for history of tobacco abuse. Patient was maintained on ventilator support for a short time followed by critical care medicine. Blood pressure still with variables and remains on antihypertensive medication. Followup speech therapy for dysphagia and currently maintained on dysphagia 3 honey thick liquids   Headache better today. Happy to be going home.  Review of Systems  HENT: Positive for neck pain.   Musculoskeletal: Positive for joint pain.       Left shoulder pain    Objective: Vital Signs: Blood pressure 150/97, pulse 57, temperature 98.3 F (36.8 C), temperature source Oral, resp. rate 18, height 5\' 10"  (1.778 m), weight 65.046 kg (143 lb 6.4 oz), SpO2 97.00%. No results found. Results for orders placed during the hospital encounter of 02/06/13 (from the past 72 hour(s))  CBC     Status: Abnormal   Collection Time    02/15/13  5:52 AM      Result Value Range   WBC 9.0  4.0 - 10.5 K/uL   RBC 3.94 (*) 4.22 - 5.81 MIL/uL   Hemoglobin  13.4  13.0 - 17.0 g/dL   HCT 40.9  81.1 - 91.4 %   MCV 101.0 (*) 78.0 - 100.0 fL   MCH 34.0  26.0 - 34.0 pg   MCHC 33.7  30.0 - 36.0 g/dL   RDW 78.2  95.6 - 21.3 %   Platelets 298  150 - 400 K/uL  BASIC METABOLIC PANEL     Status: Abnormal   Collection Time    02/15/13  5:52 AM      Result Value Range   Sodium 136  135 - 145 mEq/L   Potassium 3.9  3.5 - 5.1 mEq/L   Chloride 100  96 - 112 mEq/L   CO2 23  19 - 32 mEq/L   Glucose, Bld 104 (*) 70 - 99 mg/dL   BUN 10  6 - 23 mg/dL   Creatinine, Ser 0.86  0.50 - 1.35 mg/dL   Calcium 9.4  8.4 - 57.8 mg/dL   GFR calc non Af Amer >90  >90 mL/min   GFR calc Af Amer >90  >90 mL/min   Comment:            The eGFR has been calculated     using the CKD EPI equation.     This calculation has not been     validated in all clinical     situations.     eGFR's persistently     <90 mL/min signify     possible Chronic Kidney Disease.     HEENT: normal and no facial droop Cardio: RRR Resp: CTA B/L and unlabored  GI: BS positive and non tender Extremity:  Pulses positive and No Edema Skin:   Intact Neuro: Alert/Oriented, Confused, Cranial Nerve II-XII normal, Normal Sensory, Abnormal Motor 4/5 on Left side and Abnormal FMC Ataxic/ dec FMC. Arouses easily. Intact basic insight and awareness Musc/Skel:  Normal Gen NAD   Assessment/Plan: 1. Functional deficits secondary to R ICH which require 3+ hours per day of interdisciplinary therapy in a comprehensive inpatient rehab setting. Physiatrist is providing close team supervision and 24 hour management of active medical problems listed below. Physiatrist and rehab team continue to assess barriers to discharge/monitor patient progress toward functional and medical goals. FIM: FIM - Bathing Bathing Steps Patient Completed: Chest;Right Arm;Left Arm;Abdomen;Front perineal area;Buttocks;Right upper leg;Left upper leg;Right lower leg (including foot);Left lower leg (including foot) Bathing: 5:  Supervision: Safety issues/verbal cues  FIM - Upper Body Dressing/Undressing Upper body dressing/undressing steps patient completed: Thread/unthread right sleeve of pullover shirt/dresss;Thread/unthread left sleeve of pullover shirt/dress;Put head through opening of pull over shirt/dress;Pull shirt over trunk Upper body dressing/undressing: 5: Supervision: Safety issues/verbal cues FIM - Lower Body Dressing/Undressing Lower body dressing/undressing steps patient completed: Thread/unthread right pants leg;Pull underwear up/down;Thread/unthread left underwear leg;Thread/unthread right underwear leg;Fasten/unfasten pants;Don/Doff right sock;Don/Doff left sock;Don/Doff right shoe;Don/Doff left shoe;Thread/unthread left pants leg;Pull pants up/down Lower body dressing/undressing: 5: Supervision: Safety issues/verbal cues  FIM - Toileting Toileting steps completed by patient: Adjust clothing prior to toileting;Adjust clothing after toileting Toileting Assistive Devices: Grab bar or rail for support Toileting: 7: Independent: No helper, no device  FIM - Diplomatic Services operational officer Devices:  (pt stood to urinate) Toilet Transfers: 5-To toilet/BSC: Supervision (verbal cues/safety issues);5-From toilet/BSC: Supervision (verbal cues/safety issues)  FIM - Press photographer Assistive Devices: Arm rests Bed/Chair Transfer: 7: Supine > Sit: No assist;7: Sit > Supine: No assist;5: Bed > Chair or W/C: Supervision (verbal cues/safety issues);5: Chair or W/C > Bed: Supervision (verbal cues/safety issues)  FIM - Locomotion: Wheelchair Locomotion: Wheelchair: 0: Activity did not occur FIM - Locomotion: Ambulation Locomotion: Ambulation Assistive Devices:  (no AD) Ambulation/Gait Assistance: 5: Supervision Locomotion: Ambulation: 5: Travels 150 ft or more with supervision/safety issues  Comprehension Comprehension Mode: Auditory Comprehension: 5-Follows basic  conversation/direction: With no assist  Expression Expression Mode: Verbal Expression: 5-Expresses complex 90% of the time/cues < 10% of the time  Social Interaction Social Interaction: 3-Interacts appropriately 50 - 74% of the time - May be physically or verbally inappropriate.  Problem Solving Problem Solving: 4-Solves basic 75 - 89% of the time/requires cueing 10 - 24% of the time  Memory Memory: 5-Recognizes or recalls 90% of the time/requires cueing < 10% of the time   Medical Problem List and Plan:  1. 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 DVT Prophylaxis/Anticoagulation: Subcutaneous Lovenox initiated 02/01/2013. Monitor platelet counts any signs of bleeding  3. Pain Management/headaches.   -oxycodone for headaches in place of tramadol. Headaches better today 4. Neuropsych: This patient is not capable of making decisions on his/her own behalf.  5. Dysphagia. SLP upgraded to Dysphagia 3 thin liquids. Monitor for any signs of aspiration.   -off of IVF, encourage PO 6. Hypertension with poor control:  Clonidine 0.2 mg increased to tid. Monitor with increased activity. Provide counseling in regards to hypertension. Increased labetalol to 300mg  bid  -continue hctz, consider hydralazine as an outpt--  -SW has worked on Research officer, political party of medications 7. Tobacco/alcohol. NicoDerm patch. Provide counseling LOS (Days)  10 A FACE TO FACE EVALUATION WAS PERFORMED  Aniken Monestime T 02/16/2013, 9:54 AM

## 2013-02-17 NOTE — Progress Notes (Signed)
Patient ID: Rick Williams, male   DOB: 04-01-1966, 47 y.o.   MRN: [id number] Subjective/Complaints: 47 y.o. 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 Patient independent prior to admission working full-time. Admitted 01/30/2013 of left-sided weakness and altered mental status. Reports the patient had not been taking his blood pressure medications for 3 months. He was found on the floor by his wife. Blood pressure 190/110. 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 Patient was not a TPA candidate secondary to ICH. Patient was placed on the nicardipine drip for blood pressure control. Neurology services followup with conservative care. Followup cranial CT scan 01/31/2013 without change. Subcutaneous Lovenox initiated 02/01/2013 for DVT prophylaxis. NicoDerm patch added for history of tobacco abuse. Patient was maintained on ventilator support for a short time followed by critical care medicine. Blood pressure still with variables and remains on antihypertensive medication. Followup speech therapy for dysphagia and currently maintained on dysphagia 3 honey thick liquids   Patient feels well today. No complaints. He is excited about going home.  Review of systems: No specific complaints.  Objective: Vital Signs: Blood pressure 158/103, pulse 63, temperature 98.2 F (36.8 C), temperature source Oral, resp. rate 18, height 5\' 10"  (1.778 m), weight 143 lb 6.4 oz (65.046 kg), SpO2 98.00%.  Physical exam: Thin male in no acute distress. Chest clear to auscultation. Cardiac exam S1-S2 are regular. Abdominal exam thin, canal sounds, soft. Extremities no edema.   Assessment/Plan: He is ready for discharge today. 1. Functional deficits secondary to R ICH Medical Problem List and Plan:  1. Large right basal ganglia ICH   likely due to malignant hypertension  2. DVT Prophylaxis/Anticoagulation: Subcutaneous Lovenox 3. Pain Management/headaches.   -oxycodone for headaches in place of tramadol. Headaches better today 4. Neuropsych: This patient is not capable of making decisions on his/her own behalf.  5. Dysphagia. SLP upgraded to Dysphagia 3 thin liquids. Monitor for any signs of aspiration.   -off of IVF, encourage PO 6. Hypertension : He will need outpatient followup. BP Readings from Last 3 Encounters:  02/17/13 158/103  02/06/13 143/86  06/14/12 184/122   7. Tobacco/alcohol. He understands the need for complete abstinence of alcohol and tobacco. LOS (Days) 11 A FACE TO FACE EVALUATION WAS PERFORMED  Rick Williams 02/17/2013, 8:04 AM

## 2013-02-19 ENCOUNTER — Ambulatory Visit (HOSPITAL_COMMUNITY): Payer: Self-pay | Admitting: Specialist

## 2013-02-19 ENCOUNTER — Telehealth (HOSPITAL_COMMUNITY): Payer: Self-pay

## 2013-02-20 ENCOUNTER — Ambulatory Visit (HOSPITAL_COMMUNITY): Payer: Self-pay | Admitting: Speech Pathology

## 2013-02-26 ENCOUNTER — Ambulatory Visit (HOSPITAL_COMMUNITY)
Admission: RE | Admit: 2013-02-26 | Discharge: 2013-02-26 | Disposition: A | Discharge status: Home / Self Care | Payer: Self-pay | Source: Ambulatory Visit | Attending: Physical Medicine & Rehabilitation | Admitting: Physical Medicine & Rehabilitation

## 2013-02-26 DIAGNOSIS — R279 Unspecified lack of coordination: Secondary | ICD-10-CM | POA: Insufficient documentation

## 2013-02-26 DIAGNOSIS — M545 Low back pain, unspecified: Secondary | ICD-10-CM | POA: Insufficient documentation

## 2013-02-26 DIAGNOSIS — M542 Cervicalgia: Secondary | ICD-10-CM | POA: Insufficient documentation

## 2013-02-26 DIAGNOSIS — R29898 Other symptoms and signs involving the musculoskeletal system: Secondary | ICD-10-CM | POA: Insufficient documentation

## 2013-02-26 DIAGNOSIS — R51 Headache: Secondary | ICD-10-CM | POA: Insufficient documentation

## 2013-02-26 DIAGNOSIS — Z5189 Encounter for other specified aftercare: Secondary | ICD-10-CM | POA: Insufficient documentation

## 2013-02-26 DIAGNOSIS — I69998 Other sequelae following unspecified cerebrovascular disease: Secondary | ICD-10-CM | POA: Insufficient documentation

## 2013-02-26 DIAGNOSIS — I69993 Ataxia following unspecified cerebrovascular disease: Secondary | ICD-10-CM | POA: Insufficient documentation

## 2013-02-26 DIAGNOSIS — R531 Weakness: Secondary | ICD-10-CM | POA: Insufficient documentation

## 2013-02-26 DIAGNOSIS — M6281 Muscle weakness (generalized): Secondary | ICD-10-CM | POA: Insufficient documentation

## 2013-02-26 DIAGNOSIS — I69919 Unspecified symptoms and signs involving cognitive functions following unspecified cerebrovascular disease: Secondary | ICD-10-CM | POA: Insufficient documentation

## 2013-02-26 DIAGNOSIS — I10 Essential (primary) hypertension: Secondary | ICD-10-CM | POA: Insufficient documentation

## 2013-02-26 DIAGNOSIS — I69992 Facial weakness following unspecified cerebrovascular disease: Secondary | ICD-10-CM | POA: Insufficient documentation

## 2013-02-26 DIAGNOSIS — M25569 Pain in unspecified knee: Secondary | ICD-10-CM | POA: Insufficient documentation

## 2013-02-26 NOTE — Evaluation (Signed)
Physical Therapy Evaluation  Patient Details  Name: Rick Williams MRN: [id number] Date of Birth: [birth year]  Today's Date: 02/26/2013 Time: 1430-1500 PT Time Calculation (min): 30 min Charges 1 evaluation             Visit#: 1 of 4  Re-eval: 03/28/13 Assessment Diagnosis: CVA w/Lt sided weakness Surgical Date: 01/30/13 Next MD Visit: Dr. Swartz6/23/14  Authorization: Self Pay    Authorization Time Period:    Authorization Visit#:   of     Past Medical History:  Past Medical History  Diagnosis Date  . Hypertension   . Pneumonia   . Hemothorax on left   . Stroke 2007    no deficits   Past Surgical History: No past surgical history on file.  Subjective Symptoms/Limitations Symptoms: Pt is referred to PT s/p Rt basal ganglia ICA hemmorahge on 01/30/13 due to hypertension without complaince of HTN medication. Significant hx of stroke without residual effects.  Admitted to CIR on 5/20-02/18/13 and was d/c home with supervision.  He attempted working on Wedensday and was very fatgiued by the end of the day.   His c/co is cramping to BLE hamstrings and gastroc reigion, decreased activity tolerance to retunr to work.  Patient Stated Goals: improve strength for return to work.  Pain Assessment Currently in Pain?: Yes Pain Score:   6 (FACES) Pain Location: Leg Pain Orientation: Right;Left;Posterior Pain Type: Neuropathic pain (Cramping) Pain Onset: 1 to 4 weeks ago Pain Frequency: Intermittent Pain Relieving Factors: stretching Effect of Pain on Daily Activities: difficulty sleeping  Balance Screening Balance Screen Has the patient fallen in the past 6 months: No Has the patient had a decrease in activity level because of a fear of falling? : Yes Is the patient reluctant to leave their home because of a fear of falling? 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  Sensation/Coordination/Flexibility/Functional Tests Functional Tests Functional Tests: 5 sit to stands w/o UE support 9 sec  Assessment LLE Strength Left Hip Flexion: 4/5 Left Hip Extension: 4/5 Left Hip ABduction: 4/5 Left Hip ADduction: 4/5 Left Knee Flexion: 4/5 Left Knee Extension: 4/5  Mobility/Balance  Ambulation/Gait Ambulation/Gait Assistance: 7: Independent Gait Pattern: Antalgic Stair Management Technique: No rails Number of Stairs: 10 Height of Stairs: 6   Exercise/Treatments Stretches Active Hamstring Stretch: 2 reps;30 seconds (ble) Standing Stairs: 1 RT w/1 handrail, 1 RT independent Supine Straight Leg Raises: Left;5 reps;Limitations Other Supine Knee Exercises: bidging with alternating SLR x5 reps Sidelying Hip ABduction: Left;5 reps;Limitations Hip ABduction Limitations: 5 secs Hip ADduction: Left;5 reps;Limitations Hip ADduction Limitations: 5 sec hold Prone  Hip Extension: Left;5 reps;Limitations Hip Extension Limitations: 5sec holds  Physical Therapy Assessment and Plan PT Assessment and Plan Clinical Impression Statement: Pt is a 47 y.o male referred to OP PT s/p BG CVA on 01/30/13 with Lt sided weakness and impairments listed below.  At this time his strength is 4/5 thorughout LE, has greatest deficits with impaired activity tolerance.  Due to patients finacial difficulties, discussed in detail about using local high school bleachers and track to improve his overall activity tolerance and discussed improtance of continuing with HEP.  Discussed risk factors of further stroke.  Pt will  benefit from skilled therapeutic intervention in order to improve on the following deficits: Abnormal  gait;Pain;Decreased strength Rehab Potential: Good PT Frequency: Min 1X/week PT Duration: 4 weeks PT Treatment/Interventions: Gait training;Stair training;Functional mobility training;Therapeutic activities;Therapeutic exercise;Balance training;Neuromuscular re-education;Patient/family education PT Plan: Continue to improve LE strength and function: NuStep, progress to standing elliptical to challenge balance, lunges, wall sits, standing balance on foam with ball throw.  Enocurage lifestyle changes to decrease risk of stroke.     Goals Home Exercise Program Pt will Perform Home Exercise Program: Independently PT Goal: Perform Home Exercise Program - Progress: Goal set today PT Short Term Goals Time to Complete Short Term Goals: 2 weeks PT Short Term Goal 1: Pt will improve LLE strength 1 muscle grade to improve independence with work activities.  PT Short Term Goal 2: Pt will report decreased spasms to BLE for improved QOL.   Problem List Patient Active Problem List   Diagnosis Date Noted  . Left-sided weakness 02/26/2013  . Leg weakness 02/26/2013  . Pain in joint, lower leg 02/26/2013  . ICH (intracerebral hemorrhage) 02/07/2013  . Essential hypertension, malignant 02/06/2013  . Encounter for long-term (current) use of high-risk medication 02/06/2013  . Hemorrhagic stroke 01/30/2013  . Acute respiratory failure due to AMS 01/30/2013  . Hypertension 01/30/2013  . IVH (intraventricular hemorrhage) 01/30/2013  . Intracranial hypertension 01/30/2013    PT Plan of Care PT Home Exercise Plan: see scanned report PT Patient Instructions: given patient information and pt signed financial obligation form, discussed risk factors of stroke, encouraged HEP and continuing stair training at local track when able, answered questions about diagnosis.  Consulted and Agree with Plan of Care: Patient  Annett Fabian, MPT ATC 02/26/2013, 3:16 PM  Physician Documentation Your signature is required to  indicate approval of the treatment plan as stated above.  Please sign and either send electronically or make a copy of this report for your files and return this physician signed original.   Please mark one 1.__approve of plan  2. ___approve of plan with the following conditions.   ______________________________                                                          _____________________ Physician Signature                                                                                                             Date

## 2013-02-26 NOTE — Evaluation (Signed)
Occupational Therapy Evaluation  Patient Details  Name: Rick Williams MRN: [id number] Date of Birth: 1966/09/09  Today's Date: 02/26/2013 Time: 1345-1430 OT Time Calculation (min): 45 min Ot eval [id number] 45'  Visit#: 1 of 4  Re-eval: 03/26/13  Assessment Diagnosis: Rt CVA Next MD Visit: Dr. Wynn Banker -03/12/13  Authorization: medicaid pending  Authorization Time Period:    Authorization Visit#:   of     Past Medical History:  Past Medical History  Diagnosis Date  . Hypertension   . Pneumonia   . Hemothorax on left   . Stroke 2007    no deficits   Past Surgical History: No past surgical history on file.  Subjective Symptoms/Limitations Symptoms: S: I feel like I don't have any fingers on my left hand.  Pertinent History: Rick Williams is a 47 y.o. 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 Patient independent prior to admission working full-time. Admitted 01/30/2013 of left-sided weakness and altered mental status. Reports the patient had not been taking his blood pressure medications for 3 months. He was found on the floor by his wife. Blood pressure 190/110. 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 Patient was not a TPA candidate secondary to ICH. Patient received Inpatient Rehab at [facility] for 10 days. Dr. Wynn Banker has referred patient to occupational therapy for evaluation and treatment.  Special Tests: FAQ: 41/64 = 64% independent 36% impaired  Precautions/Restrictions  Precautions Precautions: Fall  Balance Screening Balance Screen Has the patient fallen in the past 6 months: No Has the patient had a decrease in activity level because of a fear of falling? : Yes Is the patient reluctant to leave their home because of a fear of falling? : No  Prior Functioning  Home  Living Lives With: Spouse Available Help at Discharge: Family;Available 24 hours/day Type of Home: House Home Access: Stairs to enter [employer] of Steps: 2 Home Layout: One level Bathroom Shower/Tub: Tub/shower unit;Curtain Firefighter: Standard Additional Comments: Shower chair with back Prior Function Level of Independence: Independent with gait;Independent with basic ADLs Able to Take Stairs?: Yes Driving: No Vocation: Full time employment Vocation Requirements: Production designer, theatre/television/film of car detailing business Leisure: Hobbies-yes (Comment)  Assessment ADL/Vision/Perception Dominant Hand: Right Vision - History Patient Visual Report: Blurring of vision;Diplopia;Eye fatigue/eye pain/headache  Cognition/Observation Cognition Overall Cognitive Status: Within Functional Limits for tasks assessed Arousal/Alertness: Awake/alert Orientation Level: Oriented X4  Sensation/Coordination/Edema Sensation Light Touch: Impaired Detail Stereognosis: Appears Intact Hot/Cold: Impaired Detail Proprioception: Appears Intact Coordination 9 Hole Peg Test: Lt: 31.8" Rt" 22.4"  Additional Assessments RUE Assessment RUE Assessment: Within Functional Limits RUE Strength Right Shoulder Flexion: 5/5 Right Shoulder ABduction: 5/5 Right Shoulder Internal Rotation: 5/5 Right Shoulder External Rotation: 5/5 Grip (lbs): 115 Lateral Pinch: 26 lbs 3 Point Pinch: 21 lbs LUE Strength Left Shoulder Flexion: 4/5 Left Shoulder ABduction: 4/5 Left Shoulder Internal Rotation: 4/5 Left Shoulder External Rotation: 4/5 Grip (lbs): 55 Lateral Pinch: 18 lbs 3 Point Pinch: 10 lbs         Occupational Therapy Assessment and Plan OT Assessment and Plan Clinical Impression Statement: A:  Patient presents s/p CVA with left side weakness.  His deficits include decreased GMC/FMC, strength causing decreased ability to participate in his daily activities and work activities. Pt will benefit from  skilled therapeutic intervention in order to improve on the following deficits:  Impaired UE functional use;Impaired vision/preception;Impaired sensation;Decreased strength;Decreased coordination Rehab Potential: Excellent OT Frequency: Min 1X/week OT Duration: 4 weeks OT Treatment/Interventions: Self-care/ADL training;Therapeutic activities;Therapeutic exercise;Neuromuscular education;Visual/perceptual remediation/compensation;Patient/family education;DME and/or AE instruction;Modalities;Manual therapy OT Plan: P:  Skilled OT intervention is indicated to improve GMC/FMC, strength, and sustained activity tolerance in left upper extremity needed for return to prior level of independence with B/IADLs, work, and leisure activities.   Goals Short Term Goals Time to Complete Short Term Goals: 2 weeks Short Term Goal 1: Patient will be educated on HEP. 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 Short Term Goal 3: Patient will improve left grip strength 10 pounds and pinch strength by 5 pound for increased ability to manipulate tools. Short Term Goal 4: Patient will improve left hand coordination for increased ablity to manipulate nails by decreasing completion time on Nine Hole Peg Test by 5". Short Term Goal 5: Patient will increase his left shoulder strength to 5/5 for increased ability to lift building materials at work. Long Term Goals Time to Complete Long Term Goals: 4 weeks Long Term Goal 1: Patient will return to highest level of independence with all daily, leisure, and work activities.  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 Long Term Goal 3: Patient will improve left grip strength by 15 pounds and pinch strength by 7 pound for increased ability to manipulate tools.  Problem List Patient Active Problem List   Diagnosis Date  Noted  . Left-sided weakness 02/26/2013  . Leg weakness 02/26/2013  . Pain in joint, lower leg 02/26/2013  . Muscle weakness (generalized) 02/26/2013  . Lack of coordination 02/26/2013  . ICH (intracerebral hemorrhage) 02/07/2013  . Essential hypertension, malignant 02/06/2013  . Encounter for long-term (current) use of high-risk medication 02/06/2013  . Hemorrhagic stroke 01/30/2013  . Acute respiratory failure due to AMS 01/30/2013  . Hypertension 01/30/2013  . IVH (intraventricular hemorrhage) 01/30/2013  . Intracranial hypertension 01/30/2013    End of Session Activity Tolerance: Patient tolerated treatment well General Behavior During Therapy: [facility] for tasks assessed/performed Cognition: WFL for tasks performed OT Plan of Care OT Home Exercise Plan: fine motor coordination and green theraputty OT Patient Instructions: handout Consulted and Agree with Plan of Care: Patient   Limmie Patricia, OTR/L,CBIS   02/26/2013, 4:41 PM  Physician Documentation Your signature is required to indicate approval of the treatment plan as stated above.  Please sign and either send electronically or make a copy of this report for your files and return this physician signed original.  Please mark one 1.__approve of plan  2. ___approve of plan with the following conditions.   ______________________________                                                          _____________________ Physician Signature  Date  

## 2013-02-28 ENCOUNTER — Ambulatory Visit (HOSPITAL_COMMUNITY)
Admission: RE | Admit: 2013-02-28 | Discharge: 2013-02-28 | Disposition: A | Discharge status: Home / Self Care | Payer: Self-pay | Source: Ambulatory Visit | Attending: Physical Medicine & Rehabilitation | Admitting: Physical Medicine & Rehabilitation

## 2013-02-28 DIAGNOSIS — I619 Nontraumatic intracerebral hemorrhage, unspecified: Secondary | ICD-10-CM | POA: Diagnosis present

## 2013-02-28 DIAGNOSIS — R531 Weakness: Secondary | ICD-10-CM | POA: Diagnosis present

## 2013-02-28 NOTE — Evaluation (Deleted)
Speech Language Pathology Evaluation Patient Details  Name: Rick Williams MRN: [id number] Date of Birth: 16-Jun-1966  Today's Date: 02/28/2013 Time: 0815-0900 SLP Time Calculation (min): 45 min  Authorization: Medicaid  Authorization Time Period: Before 10th Visit  Authorization Visit#:  1 of  4   Past Medical History:  Past Medical History  Diagnosis Date  . Hypertension   . Pneumonia   . Hemothorax on left   . Stroke 2007    no deficits   Past Surgical History: No past surgical history on file. HPI:  Symptoms/Limitations Symptoms: Pt c/o difficulty with speed of processing, word finding issues, problem solving, and left side facial weakness.  Special Tests: Informal assessments only due to pt's higher level.  Pain Assessment Currently in Pain?: No/denies Pain Score: 0-No pain  Prior Functional Status  Cognitive/Linguistic Baseline: Within functional limits Type of Home: House Lives With: Spouse Available Help at Discharge: Family;Available 24 hours/day Education: Pt finished high school. Vocation: Full time employment  Balance Screening Balance Screen Has the patient fallen in the past 6 months: No Has the patient had a decrease in activity level because of a fear of falling? : Yes Is the patient reluctant to leave their home because of a fear of falling? : No  Cognition  Overall Cognitive Status: Impaired/Different from baseline Arousal/Alertness: Awake/alert Orientation Level: Oriented X4 Attention: Focused Focused Attention: Appears intact Selective Attention: Appears intact Memory: Impaired Memory Impairment: Decreased recall of new information;Decreased short term memory Decreased Short Term Memory: Verbal basic;Functional basic Awareness: Appears intact Awareness Impairment: Intellectual impairment Problem Solving: Impaired Problem Solving Impairment: Functional basic;Verbal basic Executive Function: Reasoning;Organizing;Decision Making Reasoning:  Impaired Reasoning Impairment: Verbal basic;Functional basic Organizing: Impaired Organizing Impairment: Verbal basic;Functional basic Decision Making: Impaired Decision Making Impairment: Verbal basic;Functional basic Behaviors: Impulsive Safety/Judgment: Impaired  Comprehension  Auditory Comprehension Overall Auditory Comprehension: Appears within functional limits for tasks assessed Yes/No Questions: Within Functional Limits Commands: Within Functional Limits Conversation: Simple Interfering Components: Attention Reading Comprehension Reading Status: Within funtional limits (Pt's vision impairment impacted reading.)  Expression  Expression Primary Mode of Expression: Verbal Verbal Expression Overall Verbal Expression: Appears within functional limits for tasks assessed Written Expression Dominant Hand: Right Written Expression: Not tested  Oral/Motor  Oral Motor/Sensory Function Overall Oral Motor/Sensory Function: Appears within functional limits for tasks assessed Labial ROM: Within Functional Limits Labial Symmetry: Abnormal symmetry left Labial Strength: Within Functional Limits Labial Sensation: Within Functional Limits Lingual ROM: Within Functional Limits Lingual Symmetry: Within Functional Limits Lingual Strength: Within Functional Limits Lingual Sensation: Within Functional Limits Facial ROM: Reduced left Facial Symmetry: Left droop Facial Strength: Within Functional Limits Facial Sensation: Within Functional Limits Velum: Within Functional Limits Mandible: Within Functional Limits Motor Speech Overall Motor Speech: Appears within functional limits for tasks assessed Respiration: Within functional limits Phonation: Normal Resonance: Within functional limits Articulation: Within functional limitis Intelligibility: Intelligible Motor Planning: Witnin functional limits Motor Speech Errors: Not applicable  SLP Goals  Home Exercise SLP Goal: Patient  will Perform Home Exercise Program: with supervision, verbal cues required/provided SLP Short Term Goals SLP Short Term Goal 1: Pt will answer questions related to home safety, organization, and problem solving with 100% accuracy provided mild cueing. SLP Short Term Goal 2: Pt will increase facial symmetry to [facility] provided manual stretching, oral motor ex, and home ex modalities.  SLP Long Term Goals SLP Long Term Goal 1: Pt will increase safety in home environment by means of increased cognition and processing abilities.  SLP Long Term Goal  2: Pt will increase facial symmetry to 100% by means of exercises, manual stretches, and home ex program.  Assessment/Plan  Patient Active Problem List   Diagnosis Date Noted  . Left-sided weakness 02/26/2013  . Leg weakness 02/26/2013  . Pain in joint, lower leg 02/26/2013  . Muscle weakness (generalized) 02/26/2013  . Lack of coordination 02/26/2013  . ICH (intracerebral hemorrhage) 02/07/2013  . Essential hypertension, malignant 02/06/2013  . Encounter for long-term (current) use of high-risk medication 02/06/2013  . Hemorrhagic stroke 01/30/2013  . Acute respiratory failure due to AMS 01/30/2013  . Hypertension 01/30/2013  . IVH (intraventricular hemorrhage) 01/30/2013  . Intracranial hypertension 01/30/2013   SLP - End of Session Activity Tolerance: Patient tolerated treatment well General Behavior During Therapy: WFL for tasks assessed/performed Cognition: Impaired Cognitive Impairment: Mild impairment with higher level cognitive tasks.   SLP Assessment/Plan Clinical Impression Statement: Pt presents with mild impairment of higher level executive functioning. In addition, pt presents with left side facial assymetry with no effect towards speech or swallowing.  Speech Therapy Frequency: min 1 x/week Duration: 4 weeks Treatment/Interventions: Oral motor exercises;Cognitive reorganization;Patient/family education;SLP instruction and  feedback;Functional tasks Potential to Achieve Goals: Good Potential Considerations: Family/community support  GN    Rick Williams S 02/28/2013, 11:10 AM  Physician Documentation Your signature is required to indicate approval of the treatment plan as stated above.  Please sign and either send electronically or make a copy of this report for your files and return this physician signed original.  Please mark one 1.__approve of plan  2. ___approve of plan with the following conditions.   ______________________________                                                          _____________________ Physician Signature                                                                                                             Date

## 2013-02-28 NOTE — Patient Instructions (Signed)
SLP to provide pt with HEP per the initiation of treatment sessions.

## 2013-02-28 NOTE — Evaluation (Signed)
Speech Language Pathology Evaluation Patient Details  Name: Rick Williams MRN: [id number] Date of Birth: 01-Oct-1965  Today's Date: 02/28/2013 Time: 0815-0900 SLP Time Calculation (min): 45 min  Authorization: Medicaid  Authorization Time Period: Before 10th Visit  Authorization Visit#: 1  of  4   Past Medical History:  Past Medical History  Diagnosis Date  . Hypertension   . Pneumonia   . Hemothorax on left   . Stroke 2007    no deficits   Past Surgical History: No past surgical history on file. HPI:  Symptoms/Limitations Symptoms: Pt c/o difficulty with speed of processing, word finding issues, problem solving, and left side facial weakness.  Special Tests: Informal assessments only due to pt's higher level.  Pain Assessment Currently in Pain?: No/denies Pain Score: 0-No pain  Prior Functional Status  Cognitive/Linguistic Baseline: Within functional limits Type of Home: House Lives With: Spouse Available Help at Discharge: Family;Available 24 hours/day Education: Pt finished high school. Vocation: Full time employment  Balance Screening Balance Screen Has the patient fallen in the past 6 months: No Has the patient had a decrease in activity level because of a fear of falling? : Yes Is the patient reluctant to leave their home because of a fear of falling? : No  Cognition  Overall Cognitive Status: Impaired/Different from baseline Arousal/Alertness: Awake/alert Orientation Level: Oriented X4 Attention: Focused Focused Attention: Appears intact Selective Attention: Appears intact Memory: Impaired Memory Impairment: Decreased recall of new information;Decreased short term memory Decreased Short Term Memory: Verbal basic;Functional basic Awareness: Appears intact Awareness Impairment: Intellectual impairment Problem Solving: Impaired Problem Solving Impairment: Functional basic;Verbal basic Executive Function: Reasoning;Organizing;Decision Making Reasoning:  Impaired Reasoning Impairment: Verbal basic;Functional basic Organizing: Impaired Organizing Impairment: Verbal basic;Functional basic Decision Making: Impaired Decision Making Impairment: Verbal basic;Functional basic Behaviors: Impulsive Safety/Judgment: Impaired  Comprehension  Auditory Comprehension Overall Auditory Comprehension: Appears within functional limits for tasks assessed Yes/No Questions: Within Functional Limits Commands: Within Functional Limits Conversation: Simple Interfering Components: Attention Reading Comprehension Reading Status: Within funtional limits (Pt's vision impairment impacted reading.)  Expression  Expression Primary Mode of Expression: Verbal Verbal Expression Overall Verbal Expression: Appears within functional limits for tasks assessed Written Expression Dominant Hand: Right Written Expression: Not tested  Oral/Motor  Oral Motor/Sensory Function Overall Oral Motor/Sensory Function: Appears within functional limits for tasks assessed Labial ROM: Within Functional Limits Labial Symmetry: Abnormal symmetry left Labial Strength: Within Functional Limits Labial Sensation: Within Functional Limits Lingual ROM: Within Functional Limits Lingual Symmetry: Within Functional Limits Lingual Strength: Within Functional Limits Lingual Sensation: Within Functional Limits Facial ROM: Reduced left Facial Symmetry: Left droop Facial Strength: Within Functional Limits Facial Sensation: Within Functional Limits Velum: Within Functional Limits Mandible: Within Functional Limits Motor Speech Overall Motor Speech: Appears within functional limits for tasks assessed Respiration: Within functional limits Phonation: Normal Resonance: Within functional limits Articulation: Within functional limitis Intelligibility: Intelligible Motor Planning: Witnin functional limits Motor Speech Errors: Not applicable  SLP Goals  Home Exercise SLP Goal: Patient  will Perform Home Exercise Program: with supervision, verbal cues required/provided SLP Short Term Goals SLP Short Term Goal 1: Pt will answer questions related to home safety, organization, and problem solving with 100% accuracy provided mild cueing. SLP Short Term Goal 2: Pt will increase facial symmetry to [facility] provided manual stretching, oral motor ex, and home ex modalities.  SLP Long Term Goals SLP Long Term Goal 1: Pt will increase safety in home environment by means of increased cognition and processing abilities.  SLP Long Term Goal  2: Pt will increase facial symmetry to 100% by means of exercises, manual stretches, and home ex program.  Assessment/Plan  Patient Active Problem List   Diagnosis Date Noted  . Left-sided weakness 02/26/2013  . Leg weakness 02/26/2013  . Pain in joint, lower leg 02/26/2013  . Muscle weakness (generalized) 02/26/2013  . Lack of coordination 02/26/2013  . ICH (intracerebral hemorrhage) 02/07/2013  . Essential hypertension, malignant 02/06/2013  . Encounter for long-term (current) use of high-risk medication 02/06/2013  . Hemorrhagic stroke 01/30/2013  . Acute respiratory failure due to AMS 01/30/2013  . Hypertension 01/30/2013  . IVH (intraventricular hemorrhage) 01/30/2013  . Intracranial hypertension 01/30/2013   SLP - End of Session Activity Tolerance: Patient tolerated treatment well General Behavior During Therapy: WFL for tasks assessed/performed Cognition: Impaired Cognitive Impairment: Mild impairment with higher level cognitive tasks.   SLP Assessment/Plan Clinical Impression Statement: Pt presents with mild impairment of higher level executive functioning. In addition, pt presents with left side facial assymetry with no effect towards speech or swallowing.  Speech Therapy Frequency: min 1 x/week Duration: 4 weeks Treatment/Interventions: Oral motor exercises;Cognitive reorganization;Patient/family education;SLP instruction and  feedback;Functional tasks Potential to Achieve Goals: Good Potential Considerations: Family/community support  GN    [name] S 02/28/2013, 11:08 AM  Physician Documentation Your signature is required to indicate approval of the treatment plan as stated above.  Please sign and either send electronically or make a copy of this report for your files and return this physician signed original.  Please mark one 1.__approve of plan  2. ___approve of plan with the following conditions.   ______________________________                                                          _____________________ Physician Signature                                                                                                             Date

## 2013-03-06 ENCOUNTER — Ambulatory Visit (HOSPITAL_COMMUNITY)
Admission: RE | Admit: 2013-03-06 | Discharge: 2013-03-06 | Disposition: A | Discharge status: Home / Self Care | Payer: Self-pay | Source: Ambulatory Visit | Attending: Physical Medicine & Rehabilitation | Admitting: Physical Medicine & Rehabilitation

## 2013-03-06 DIAGNOSIS — R531 Weakness: Secondary | ICD-10-CM

## 2013-03-06 DIAGNOSIS — R29898 Other symptoms and signs involving the musculoskeletal system: Secondary | ICD-10-CM

## 2013-03-06 NOTE — Progress Notes (Signed)
Physical Therapy Treatment Patient Details  Name: Rick Williams MRN: [id number] Date of Birth: Jan 24, 1966  Today's Date: 03/06/2013 Time: 0865-7846 PT Time Calculation (min): 40 min Charge Therex 40'  Visit#: 2 of 4  Re-eval: 03/28/13    Authorization: Self Pay  Authorization Time Period:    Authorization Visit#:   of     Subjective: Symptoms/Limitations Symptoms: Pt stated compliance with HEP once a day.  Reports pain Lt UE and LE rated 9/10 today/   Pain Assessment Currently in Pain?: Yes Pain Score:   9 Pain Location: Leg Pain Orientation: Left;Posterior Pain Radiating Towards: Radicular symptoms head to neck to lower back and Lt sciatic posterior LE Pain Onset: Other (comment) (past few days) Pain Frequency: Constant Pain Relieving Factors: Nothing has helped with the pain Effect of Pain on Daily Activities: sleep  Precautions/Restrictions  Precautions Precautions: Fall  Exercise/Treatments Stretches Active Hamstring Stretch: 3 reps;30 seconds;Limitations Active Hamstring Stretch Limitations: Bil LE with rope Gastroc Stretch: Limitations;3 reps;30 seconds Gastroc Stretch Limitations: slant board Aerobic Stationary Bike: NuStep Hilll #3, resistance 3, SPM >100 x 10 minutes Standing Wall Squat: 10 reps;5 seconds Rebounder: SLS rebounder 2 sets x 10 with green ball Other Standing Knee Exercises: tandem gait  Supine Bridges: 10 reps;Limitations Bridges Limitations: on green tball Straight Leg Raises: Left;10 reps Other Supine Knee Exercises: bidging with alternating SLR x5 reps Sidelying Hip ABduction: 10 reps Hip ADduction: Both;10 reps Prone  Hip Extension: Both;10 reps Hip Extension Limitations: 5sec holds      Physical Therapy Assessment and Plan PT Assessment and Plan Clinical Impression Statement: Began PT POC for functional strengthening and high level dynamic balance activities.  Pt able to demonstrate appropriate technique with all  exercises with cueing to slow down and improve form and spatial awareness.  Pt with several LOB episodes noted with balance activities, pt able to regain balance with min assistance.  Pt educated on importance of breathing during activities to reduce blood pressure and reduce risk of stroke.   PT Plan: Continue to improve LE strength and function: progress to standing elliptical to challenge balance, Enocurage lifestyle changes to decrease risk of stroke.     Goals    Problem List Patient Active Problem List   Diagnosis Date Noted  . Left-sided weakness 02/26/2013  . Leg weakness 02/26/2013  . Pain in joint, lower leg 02/26/2013  . Muscle weakness (generalized) 02/26/2013  . Lack of coordination 02/26/2013  . ICH (intracerebral hemorrhage) 02/07/2013  . Essential hypertension, malignant 02/06/2013  . Encounter for long-term (current) use of high-risk medication 02/06/2013  . Hemorrhagic stroke 01/30/2013  . Acute respiratory failure due to AMS 01/30/2013  . Hypertension 01/30/2013  . IVH (intraventricular hemorrhage) 01/30/2013  . Intracranial hypertension 01/30/2013    PT - End of Session Activity Tolerance: Patient tolerated treatment well General Behavior During Therapy: [facility] for tasks assessed/performed Cognition: WFL for tasks performed  GP    Juel Burrow 03/06/2013, 6:37 PM

## 2013-03-06 NOTE — Progress Notes (Signed)
Occupational Therapy Treatment Patient Details  Name: Rick Williams MRN: [id number] Date of Birth: 1966/07/10  Today's Date: 03/06/2013 Time: 1515-1600 OT Time Calculation (min): 45 min NM re-ed 1478-2956 35' Therex 1550-1600 10'  Visit#: 2 of 4  Re-eval: 03/26/13    Authorization: medicaid pending  Authorization Time Period:    Authorization Visit#:   of    Subjective Symptoms/Limitations Symptoms: S: I worked today at [employer]. I have to go back after therapy. I'm tired.  Pain Assessment Currently in Pain?: Yes Pain Score:   9 Pain Location: Head Pain Radiating Towards: head to neck down lower back and also through left hand  Pain Onset: Other (comment) (past few days) Pain Frequency: Constant Pain Relieving Factors: Nothing has helped with the pain Effect of Pain on Daily Activities: sleep  Precautions/Restrictions  Precautions Precautions: Fall  Exercise/Treatments Standing Horizontal ABduction: Theraband;12 reps Theraband Level (Shoulder Horizontal ABduction): Level 3 (Green) External Rotation: Theraband;12 reps Theraband Level (Shoulder External Rotation): Level 3 (Green) Internal Rotation: Theraband;12 reps Theraband Level (Shoulder Internal Rotation): Level 3 (Green) Extension: Theraband;12 reps Theraband Level (Shoulder Extension): Level 3 (Green) Row: Theraband;12 reps Theraband Level (Shoulder Row): Level 3 (Green) Retraction: Theraband;12 reps Theraband Level (Shoulder Retraction): Level 3 (Green) ROM / Strengthening / Isometric Strengthening UBE (Upper Arm Bike): 3.0 2' forward 2' reverse   Hand Exercises Hand Gripper with Large Beads: 7 - black Hand Gripper with Medium Beads: 7 - black Sponges: 15,19,21 Other Hand Exercises: digit taps; 5X each Other Hand Exercises: Eggsercizer Green 5X pinch, lateral, 3 point  Neurological Re-education Grasp and Release Grasp and Release:  (bilateral UE twisting (wrist flexion/extension)) Theraputty -  Flatten: green Theraputty - Roll: green Theraputty - Grip: green Theraputty - Pinch: green Theraputty - Locate Pegs: green-8  Fine Motor Coordination Opposition: 5 reps (sliding from tip to palm)        Occupational Therapy Assessment and Plan OT Assessment and Plan Clinical Impression Statement: A: Added various grip strengthening exercises that patient tolerated well. patient needs reminders to slow down when completing arm exercises as he was trying to "go fast to get done sooner." OT Plan: P: Cont to work on grip strengthening and shoulder strengthening exercises.    Goals Short Term Goals Time to Complete Short Term Goals: 2 weeks Short Term Goal 1: Patient will be educated on HEP. 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 Short Term Goal 2 Progress: Progressing toward goal Short Term Goal 3: Patient will improve left grip strength 5 pounds and pinch strength by 1 pound for increased ability to manipulate tools. Short Term Goal 3 Progress: Progressing toward goal Short Term Goal 4 Progress: Progressing toward goal Short Term Goal 5 Progress: Progressing toward goal Long Term Goals Time to Complete Long Term Goals: 4 weeks Long Term Goal 1: Patient will return to highest level of independence with all daily, leisure, and work activities.  Long Term Goal 1 Progress: Progressing toward goal Long Term Goal 2 Progress: Progressing toward goal Long Term Goal 3 Progress: Progressing toward goal  Problem List Patient Active Problem List   Diagnosis Date Noted  . Left-sided weakness 02/26/2013  . Leg weakness 02/26/2013  . Pain in joint, lower leg 02/26/2013  . Muscle weakness (generalized) 02/26/2013  . Lack of coordination 02/26/2013  . ICH (intracerebral hemorrhage) 02/07/2013  . Essential hypertension, malignant  02/06/2013  . Encounter for  long-term (current) use of high-risk medication 02/06/2013  . Hemorrhagic stroke 01/30/2013  . Acute respiratory failure due to AMS 01/30/2013  . Hypertension 01/30/2013  . IVH (intraventricular hemorrhage) 01/30/2013  . Intracranial hypertension 01/30/2013    End of Session Activity Tolerance: Patient tolerated treatment well General Behavior During Therapy: [facility] for tasks assessed/performed Cognition: [facility] for tasks performed   Limmie Patricia, OTR/L,CBIS   03/06/2013, 4:00 PM

## 2013-03-07 ENCOUNTER — Ambulatory Visit (HOSPITAL_COMMUNITY): Payer: Self-pay | Admitting: Speech Pathology

## 2013-03-07 ENCOUNTER — Telehealth (HOSPITAL_COMMUNITY): Payer: Self-pay

## 2013-03-12 ENCOUNTER — Inpatient Hospital Stay: Payer: Self-pay | Admitting: Physical Medicine & Rehabilitation

## 2013-03-13 ENCOUNTER — Ambulatory Visit (HOSPITAL_COMMUNITY)
Admission: RE | Admit: 2013-03-13 | Discharge: 2013-03-13 | Disposition: A | Discharge status: Home / Self Care | Payer: Self-pay | Source: Ambulatory Visit | Attending: Physical Medicine & Rehabilitation | Admitting: Physical Medicine & Rehabilitation

## 2013-03-13 DIAGNOSIS — M25562 Pain in left knee: Secondary | ICD-10-CM

## 2013-03-13 DIAGNOSIS — R29898 Other symptoms and signs involving the musculoskeletal system: Secondary | ICD-10-CM

## 2013-03-13 DIAGNOSIS — R531 Weakness: Secondary | ICD-10-CM

## 2013-03-13 NOTE — Progress Notes (Signed)
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 He reports continues to have headaches and pain to UE and LE.  Pain Assessment Currently in Pain?: Yes Pain Score: 10-Worst pain ever Pain Location: Head  Precautions/Restrictions     Exercise/Treatments Prone Exercises Other Prone Exercise: POE: cervical rotation, flexion-extension, scapular retraction Standing Lateral Step Up: Left;10 reps Forward Step Up: Left;10 reps;Step Height: 6";Hand Hold: 0;Limitations Forward Step Up Limitations: Power ups x5 reps 6 inch step Wall Squat: 10 reps;5 seconds Other Standing Knee Exercises: Vector stance 5x5 sec holds each direction   Manual Therapy Myofascial Release: suboccipital release to cervical region with soft tissue massage after (R>L side) to decrease fascial restrictions to decrease pain.   Physical Therapy Assessment and Plan PT Assessment and Plan Clinical Impression Statement: Added manual techniques and cervical exercises to decrease neck pain and improve cervical ROM.  Pt able to complete with pain at end of session 6/10 (was 10/10). PT Plan: Continue to improve LE strength and function: progress to standing elliptical to challenge balance, Enocurage lifestyle changes to decrease risk of stroke.     Goals Home Exercise Program Pt will Perform Home Exercise Program: Independently PT Goal: Perform Home Exercise Program - Progress: Met PT Short Term Goals Time to Complete Short Term Goals: 2 weeks PT Short Term Goal  1: Pt will improve LLE strength 1 muscle grade to improve independence with work activities.  PT Short Term Goal 1 - Progress: Progressing toward goal PT Short Term Goal 2: Pt will report decreased spasms to BLE for improved QOL.  PT Short Term Goal 2 - Progress: Progressing toward goal  Problem List Patient Active Problem List   Diagnosis Date Noted  . Left-sided weakness 02/26/2013  . Leg weakness 02/26/2013  . Pain in joint, lower leg 02/26/2013  . Muscle weakness (generalized) 02/26/2013  . Lack of coordination 02/26/2013  . ICH (intracerebral hemorrhage) 02/07/2013  . Essential hypertension, malignant 02/06/2013  . Encounter for long-term (current) use of high-risk medication 02/06/2013  . Hemorrhagic stroke 01/30/2013  . Acute respiratory failure due to AMS 01/30/2013  . Hypertension 01/30/2013  . IVH (intraventricular hemorrhage) 01/30/2013  . Intracranial hypertension 01/30/2013    PT - End of Session Activity Tolerance: Patient tolerated treatment well General Behavior During Therapy: [facility] for tasks assessed/performed Cognition: WFL for tasks performed  GP    Jacinta Penalver 03/13/2013, 3:06 PM

## 2013-03-13 NOTE — Progress Notes (Signed)
Occupational Therapy Treatment Patient Details  Name: Rick Williams MRN: [id number] Date of Birth: [birth year]  Today's Date: 03/13/2013 Time: 2130-8657 OT Time Calculation (min): 40 min NM re-ed [id number] 40'  Visit#: 3 of 4  Re-eval: 03/26/13    Authorization: medicaid pending  Authorization Time Period:    Authorization Visit#:   of    Subjective Symptoms/Limitations Symptoms: S: I haven't eaten all day. I'm tired.  Pain Assessment Currently in Pain?: Yes Pain Score:   6 Pain Location: Head  Precautions/Restrictions  Precautions Precautions: Fall  Exercise/Treatments Standing Protraction: Theraband;15 reps Theraband Level (Shoulder Protraction): Level 3 (Green) Horizontal ABduction: Theraband;15 reps Theraband Level (Shoulder Horizontal ABduction): Level 3 (Green) External Rotation: Theraband;15 reps Theraband Level (Shoulder External Rotation): Level 3 (Green) Internal Rotation: Theraband;15 reps Theraband Level (Shoulder Internal Rotation): Level 3 (Green) Flexion: Theraband;15 reps Theraband Level (Shoulder Flexion): Level 3 (Green) Extension: Theraband;15 reps Theraband Level (Shoulder Extension): Level 3 (Green) Row: Theraband;15 reps Theraband Level (Shoulder Row): Level 3 (Green) Retraction: Theraband;15 reps Theraband Level (Shoulder Retraction): Level 3 (Green) Hand Exercises Digit Composite Abduction: AROM;Left;10 reps Digit Composite Adduction: PROM;Left;10 reps Opposition: 5 reps (sliding from tip to palm) Theraputty:  (bilateral UE - wrist flexion/extension (twisting)) Theraputty - Flatten: green Theraputty - Roll: green Theraputty - Grip: green Theraputty - Pinch: green Theraputty - Locate Pegs: green-8 Hand Gripper with Large Beads: 7 - black Hand Gripper with Medium Beads: 7 - black Hand Gripper with Small Beads: 7- black Sponges: 10,12,15 High resistance  Neurological Re-education Exercises Sponges: 10,12,15 High resistance     Manual Therapy Myofascial Release: suboccipital release to cervical region with soft tissue massage after (R>L side) to decrease fascial restrictions to decrease pain.   Occupational Therapy Assessment and Plan OT Assessment and Plan Clinical Impression Statement: A: progressed to small beads with hand gripper. Pt tolerated well with minimal difficulty.  OT Plan: REASSESS for possible d/c.   Goals Home Exercise Program Pt will Perform Home Exercise Program: Independently PT Goal: Perform Home Exercise Program - Progress: Met Short Term Goals Time to Complete Short Term Goals: 2 weeks Short Term Goal 1: Patient will be educated on HEP. 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 Short Term Goal 2 Progress: Progressing toward goal Short Term Goal 3: Patient will improve left grip strength 5 pounds and pinch strength by 1 pound for increased ability to manipulate tools. Short Term Goal 3 Progress: Progressing toward goal Short Term Goal 4: Patient will improve left hand coordination for increased ablity to manipulate nails by decreasing completion time on Nine Hole Peg Test by 5". 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 Short Term Goal 5 Progress: Progressing toward goal Long Term Goals Time to Complete Long Term Goals: 4 weeks Long Term Goal 1: Patient will return to highest level of independence with all daily, leisure, and work activities.  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 Long Term Goal 2 Progress: Progressing toward goal Long Term Goal 3: Patient will improve left grip strength by 15 pounds  and pinch strength by 7 pound for increased ability  to manipulate tools. Long Term Goal 3 Progress: Progressing toward goal  Problem List Patient Active Problem List   Diagnosis Date Noted  . Left-sided weakness 02/26/2013  . Leg weakness 02/26/2013  . Pain in joint, lower leg 02/26/2013  . Muscle weakness (generalized) 02/26/2013  . Lack of coordination 02/26/2013  . ICH (intracerebral hemorrhage) 02/07/2013  . Essential hypertension, malignant 02/06/2013  . Encounter for long-term (current) use of high-risk medication 02/06/2013  . Hemorrhagic stroke 01/30/2013  . Acute respiratory failure due to AMS 01/30/2013  . Hypertension 01/30/2013  . IVH (intraventricular hemorrhage) 01/30/2013  . Intracranial hypertension 01/30/2013    End of Session Activity Tolerance: Patient tolerated treatment well General Behavior During Therapy: [facility] for tasks assessed/performed Cognition: [facility] for tasks performed   Limmie Patricia, OTR/L,CBIS   03/13/2013, 3:57 PM

## 2013-03-14 ENCOUNTER — Ambulatory Visit (HOSPITAL_COMMUNITY): Payer: Self-pay | Admitting: Speech Pathology

## 2013-03-15 ENCOUNTER — Ambulatory Visit (HOSPITAL_COMMUNITY)
Admission: RE | Admit: 2013-03-15 | Discharge: 2013-03-15 | Disposition: A | Discharge status: Home / Self Care | Payer: Self-pay | Source: Ambulatory Visit | Attending: Physical Medicine & Rehabilitation | Admitting: Physical Medicine & Rehabilitation

## 2013-03-15 NOTE — Progress Notes (Signed)
Speech Language Pathology Treatment Patient Details  Name: JAQUAVIAN FIRKUS MRN: [id number] Date of Birth: 05/29/1966  Today's Date: 03/15/2013 Time: 11.30 am  To 12.05  HPI:    Pain Assessment Pain Location: Head  Reports almost constant headache   MD awa  Treatment    SLP Goals  Home Exercise SLP Goal: Patient will Perform Home Exercise Program: with supervision, verbal cues required/provided SLP Short Term Goals SLP Short Term Goal 1: Pt will answer questions related to home safety, organization, and problem solving with 100% accuracy provided mild cueing. SLP Short Term Goal 1 - Progress: Progressing toward goal SLP Short Term Goal 2: Pt will increase facial symmetry to [facility] provided manual stretching, oral motor ex, and home ex modalities.  SLP Short Term Goal 2 - Progress: Progressing toward goal SLP Long Term Goals SLP Long Term Goal 1: Pt will increase safety in home environment by means of increased cognition and processing abilities.  SLP Long Term Goal 2: Pt will increase facial symmetry to 100% by means of exercises, manual stretches, and home ex program.  Oral motor/symmmetry improving   Still concern with frequent drool, to add brushing of inner cheek when brushing teeth and massage and to continue oral motor exc   No drool during session, reports happens mostly when he is concentrating on something else.    Organization/problem solving verbally at 90% today Assessment/Plan  Patient Active Problem List   Diagnosis Date Noted  . Left-sided weakness 02/26/2013  . Leg weakness 02/26/2013  . Pain in joint, lower leg 02/26/2013  . Muscle weakness (generalized) 02/26/2013  . Lack of coordination 02/26/2013  . ICH (intracerebral hemorrhage) 02/07/2013  . Essential hypertension, malignant 02/06/2013  . Encounter for long-term (current) use of high-risk medication 02/06/2013  . Hemorrhagic stroke 01/30/2013  . Acute respiratory failure due to AMS 01/30/2013  .  Hypertension 01/30/2013  . IVH (intraventricular hemorrhage) 01/30/2013  . Intracranial hypertension 01/30/2013      GN   Meda Klinefelter 03/15/2013, 2:04 PM

## 2013-03-20 ENCOUNTER — Ambulatory Visit (HOSPITAL_COMMUNITY)
Admission: RE | Admit: 2013-03-20 | Discharge: 2013-03-20 | Disposition: A | Discharge status: Home / Self Care | Payer: Self-pay | Source: Ambulatory Visit | Attending: Physical Medicine & Rehabilitation | Admitting: Physical Medicine & Rehabilitation

## 2013-03-20 DIAGNOSIS — M545 Low back pain, unspecified: Secondary | ICD-10-CM | POA: Insufficient documentation

## 2013-03-20 DIAGNOSIS — M542 Cervicalgia: Secondary | ICD-10-CM | POA: Insufficient documentation

## 2013-03-20 DIAGNOSIS — M25569 Pain in unspecified knee: Secondary | ICD-10-CM | POA: Insufficient documentation

## 2013-03-20 DIAGNOSIS — R279 Unspecified lack of coordination: Secondary | ICD-10-CM | POA: Insufficient documentation

## 2013-03-20 DIAGNOSIS — R51 Headache: Secondary | ICD-10-CM | POA: Insufficient documentation

## 2013-03-20 DIAGNOSIS — M6281 Muscle weakness (generalized): Secondary | ICD-10-CM | POA: Insufficient documentation

## 2013-03-20 DIAGNOSIS — I10 Essential (primary) hypertension: Secondary | ICD-10-CM | POA: Insufficient documentation

## 2013-03-20 DIAGNOSIS — Z5189 Encounter for other specified aftercare: Secondary | ICD-10-CM | POA: Insufficient documentation

## 2013-03-20 DIAGNOSIS — R29898 Other symptoms and signs involving the musculoskeletal system: Secondary | ICD-10-CM | POA: Insufficient documentation

## 2013-03-20 NOTE — Evaluation (Signed)
Occupational Therapy Re-Evaluation  Patient Details  Name: Rick Williams MRN: [id number] Date of Birth: 04/07/1966  Today's Date: 03/20/2013 Time: 4098-1191 OT Time Calculation (min): 15 min Reassess [id number] 15'  Visit#: 4 of 4  Re-eval:       Authorization: medicaid pending  Authorization Time Period:    Authorization Visit#:   of     Past Medical History:  Past Medical History  Diagnosis Date  . Hypertension   . Pneumonia   . Hemothorax on left   . Stroke 2007    no deficits   Past Surgical History: No past surgical history on file.  Subjective Symptoms/Limitations Symptoms: S: I haven't felt good for the past few days.  Special Tests: FAQ: 56/64 = 87.5% independent 12.5% impaired Pain Assessment Currently in Pain?: Yes Pain Score: 6  Pain Location: Head  Precautions/Restrictions  Precautions Precautions: Fall   Assessment Sensation/Coordination/Edema Coordination 9 Hole Peg Test: Lt: 31.8" (same time at eval)  Additional Assessments LUE AROM (degrees) Overall AROM Left Upper Extremity: Within functional limits for tasks assessed (on eval: 4/5) LUE Strength Left Shoulder Flexion: 5/5 Left Shoulder ABduction: 5/5 Left Shoulder Internal Rotation: 5/5 Left Shoulder External Rotation: 5/5 Left Elbow Flexion: 5/5 Grip (lbs): 55 (same score at eval) Lateral Pinch: 20 lbs (on eval: 18) 3 Point Pinch: 21 lbs (on eval: 10) Left Hand Strength - Pinch (lbs) Lateral Pinch: 20 lbs (on eval: 18) 3 Point Pinch: 21 lbs (on eval: 10)   Occupational Therapy Assessment and Plan OT Assessment and Plan Clinical Impression Statement: A: See MD note for progress. Patient met half of goals and would like to be discharged from therapy this date.  OT Plan: P: D/C from therapy.    Goals Short Term Goals Time to Complete Short Term Goals: 2 weeks Short Term Goal 1: Patient will be educated on HEP. 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 Short Term Goal 2 Progress: Progressing toward goal Short Term Goal 3: Patient will improve left grip strength 5 pounds and pinch strength by 1 pound for increased ability to manipulate tools. Short Term Goal 3 Progress: Met Short Term Goal 4: Patient will improve left hand coordination for increased ablity to manipulate nails by decreasing completion time on Nine Hole Peg Test by 5". 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 Short Term Goal 5 Progress: Met Long Term Goals Time to Complete Long Term Goals: 4 weeks Long Term Goal 1: Patient will return to highest level of independence with all daily, leisure, and work activities.  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 Long Term Goal 2 Progress: Progressing toward goal Long Term Goal 3: Patient will improve left grip strength by 15 pounds and pinch strength by 7 pound for increased ability to manipulate tools. Long Term Goal 3 Progress: Progressing toward goal  Problem List Patient Active Problem List   Diagnosis Date Noted  . Left-sided weakness 02/26/2013  . Leg weakness 02/26/2013  . Pain in joint, lower leg 02/26/2013  . Muscle weakness (generalized) 02/26/2013  . Lack of coordination 02/26/2013  . ICH (intracerebral hemorrhage) 02/07/2013  .  Essential hypertension, malignant 02/06/2013  . Encounter for long-term (current) use of high-risk medication 02/06/2013  . Hemorrhagic stroke 01/30/2013  . Acute respiratory failure due to AMS 01/30/2013  . Hypertension 01/30/2013  . IVH (intraventricular hemorrhage) 01/30/2013  . Intracranial hypertension 01/30/2013    End of Session Activity Tolerance:  Patient tolerated treatment well General Behavior During Therapy: WFL for tasks assessed/performed Cognition: WFL for tasks performed OT Plan of Care OT Home Exercise Plan: green theraband exercises OT Patient Instructions: handout Consulted and Agree with Plan of Care: Patient   Limmie Patricia, OTR/L,CBIS   03/20/2013, 3:41 PM  Physician Documentation Your signature is required to indicate approval of the treatment plan as stated above.  Please sign and either send electronically or make a copy of this report for your files and return this physician signed original.  Please mark one 1.__approve of plan  2. ___approve of plan with the following conditions.   ______________________________                                                          _____________________ Physician Signature                                                                                                             Date

## 2013-03-20 NOTE — Progress Notes (Signed)
Speech Language Pathology Treatment/Discharge Summary Patient Details  Name: Rick Williams MRN: [id number] Date of Birth: Feb 25, 1966  Today's Date: 03/20/2013 Time: 0272-5366 SLP Time Calculation (min): 32 min  Authorization: Medicaid  Authorization Time Period: Authorization Visit#:  3 of     HPI:  Symptoms/Limitations Symptoms: Doing well. Pain Assessment Currently in Pain?: No/denies Pain Score: 6  Pain Location: Head    Treatment  Cognitive-Linguistic Therapy Dysarthria Therapy Oral Motor Exercises Patient/Family Education Home Exercise Program  SLP Goals  Home Exercise SLP Goal: Patient will Perform Home Exercise Program: with supervision, verbal cues required/provided SLP Goal: Perform Home Exercise Program - Progress: Met SLP Short Term Goals SLP Short Term Goal 1: Pt will answer questions related to home safety, organization, and problem solving with 100% accuracy provided mild cueing. SLP Short Term Goal 1 - Progress: Met SLP Short Term Goal 2: Pt will increase facial symmetry to [facility] provided manual stretching, oral motor ex, and home ex modalities.  SLP Short Term Goal 2 - Progress: Met SLP Long Term Goals SLP Long Term Goal 1: Pt will increase safety in home environment by means of increased cognition and processing abilities.  SLP Long Term Goal 1 - Progress: Met SLP Long Term Goal 2: Pt will increase facial symmetry to 100% by means of exercises, manual stretches, and home ex program. SLP Long Term Goal 2 - Progress: Met  Assessment/Plan  Patient Active Problem List   Diagnosis Date Noted  . Left-sided weakness 02/26/2013  . Leg weakness 02/26/2013  . Pain in joint, lower leg 02/26/2013  . Muscle weakness (generalized) 02/26/2013  . Lack of coordination 02/26/2013  . ICH (intracerebral hemorrhage) 02/07/2013  . Essential hypertension, malignant 02/06/2013  . Encounter for long-term (current) use of high-risk medication 02/06/2013  . Hemorrhagic  stroke 01/30/2013  . Acute respiratory failure due to AMS 01/30/2013  . Hypertension 01/30/2013  . IVH (intraventricular hemorrhage) 01/30/2013  . Intracranial hypertension 01/30/2013   SLP - End of Session Activity Tolerance: Patient tolerated treatment well General Behavior During Therapy: WFL for tasks assessed/performed Cognition: Impaired Cognitive Impairment: Mild impairment with higher level cognitive tasks.   SLP Assessment/Plan Clinical Impression Statement: Pt is independent with HEP for oral motor exercises. He reports drooling throughout the day particularly when he is concentrating on something. He was advised to swallow before speaking (after rest breaks) and to purse his lips periodically and swallow when he is watching TV/reading. He completed high level memory task: 5/10, 8/10, 9/10, and 10/10 and was able to recall 10/10 words with min cues. He was given a written list of memory strategies and these were reveiwed in session. He states that he feels back to "normal", but does not anticipate that he wil be able to return to work because of fatigue with left hand. No further SLP services indicated at this time. Pt is independent with HEP and he should continue with oral motor exercises for another couple of months. Treatment/Interventions: Oral motor exercises;Cognitive reorganization;Patient/family education;SLP instruction and feedback;Functional tasks Potential to Achieve Goals: Good Potential Considerations: Family/community support  GN   Thank you,  Havery Moros, CCC-SLP [phone]  PORTER,DABNEY 03/20/2013, 4:08 PM

## 2013-03-20 NOTE — Progress Notes (Signed)
  Patient Details  Name: Rick Williams MRN: [id number] Date of Birth: [birth year]  Today's Date: 03/20/2013   Pt. Came for appointments today with OT and ST, however stated he was unable to stay to complete his PT appt. Today. Pt. Will need reassessment next visit with possible discharge next visit.     Lurena Nida, PTA/CLT 03/20/2013, 4:11 PM

## 2013-03-22 ENCOUNTER — Ambulatory Visit (HOSPITAL_COMMUNITY)
Admission: RE | Admit: 2013-03-22 | Discharge: 2013-03-22 | Disposition: A | Discharge status: Home / Self Care | Payer: Self-pay | Source: Ambulatory Visit | Attending: Physical Medicine & Rehabilitation | Admitting: Physical Medicine & Rehabilitation

## 2013-03-22 NOTE — Evaluation (Addendum)
Physical Therapy Discharge  Patient Details  Name: Rick Williams MRN: [id number] Date of Birth: 1965-10-15  Today's Date: 03/22/2013 Time: 1191-4782 PT Time Calculation (min): 15 min              Visit#: 4 of 4  Re-eval: 03/28/13 Charges: MMT x 1  Authorization: Self Pay      Subjective Symptoms/Limitations Symptoms: Pt reports HEP compliance. Pain Assessment Currently in Pain?: Yes Pain Score: 8  Pain Location: Knee Pain Orientation: Left   LLE Strength Left Hip Flexion: 5/5 (was 4/5) Left Hip Extension: 5/5 (was 4/5) Left Hip ABduction: 5/5 (was 4/5) Left Hip ADduction: 5/5 (was 4/5) Left Knee Flexion: 5/5 (was 4/5) Left Knee Extension: 5/5 (was 4/5)   Physical Therapy Assessment and Plan PT Assessment and Plan Clinical Impression Statement: Pt has progressed well with therapy. He feels that he is ready for D/C. He has increased pain in his left knee this session. He states that this is new and could be due to the weather. Spasms have decreased and strength has improved. PT Plan: Recommend D/C to HEP.    Goals PT Short Term Goals Time to Complete Short Term Goals: 2 weeks PT Short Term Goal 1: Pt will improve LLE strength 1 muscle grade to improve independence with work activities.  PT Short Term Goal 1 - Progress: Met PT Short Term Goal 2: Pt will report decreased spasms to BLE for improved QOL.  PT Short Term Goal 2 - Progress: Met  Problem List Patient Active Problem List   Diagnosis Date Noted  . Left-sided weakness 02/26/2013  . Leg weakness 02/26/2013  . Pain in joint, lower leg 02/26/2013  . Muscle weakness (generalized) 02/26/2013  . Lack of coordination 02/26/2013  . ICH (intracerebral hemorrhage) 02/07/2013  . Essential hypertension, malignant 02/06/2013  . Encounter for long-term (current) use of high-risk medication 02/06/2013  . Hemorrhagic stroke 01/30/2013  . Acute respiratory failure due to AMS 01/30/2013  . Hypertension 01/30/2013  .  IVH (intraventricular hemorrhage) 01/30/2013  . Intracranial hypertension 01/30/2013    PT - End of Session Activity Tolerance: Patient tolerated treatment well General Behavior During Therapy: [facility] for tasks assessed/performed Cognition: WFL for tasks performed  Seth Bake, PTA; Annett Fabian, MPT, ATC 03/22/2013, 4:28 PM  Physician Documentation Your signature is required to indicate approval of the treatment plan as stated above.  Please sign and either send electronically or make a copy of this report for your files and return this physician signed original.   Please mark one 1.__approve of plan  2. ___approve of plan with the following conditions.   ______________________________                                                          _____________________ Physician Signature  Date  

## 2013-04-10 ENCOUNTER — Emergency Department (HOSPITAL_COMMUNITY)
Admission: EM | Admit: 2013-04-10 | Discharge: 2013-04-10 | Disposition: A | Discharge status: Home / Self Care | Payer: Self-pay | Attending: Emergency Medicine | Admitting: Emergency Medicine

## 2013-04-10 ENCOUNTER — Encounter (HOSPITAL_COMMUNITY): Payer: Self-pay

## 2013-04-10 ENCOUNTER — Encounter: Payer: Self-pay | Attending: Physical Medicine & Rehabilitation

## 2013-04-10 ENCOUNTER — Ambulatory Visit (HOSPITAL_BASED_OUTPATIENT_CLINIC_OR_DEPARTMENT_OTHER): Payer: Self-pay | Admitting: Physical Medicine & Rehabilitation

## 2013-04-10 ENCOUNTER — Encounter: Payer: Self-pay | Admitting: Physical Medicine & Rehabilitation

## 2013-04-10 VITALS — BP 168/111 | HR 71 | Resp 16 | Ht 70.0 in | Wt 153.0 lb

## 2013-04-10 DIAGNOSIS — Z79899 Other long term (current) drug therapy: Secondary | ICD-10-CM | POA: Insufficient documentation

## 2013-04-10 DIAGNOSIS — I10 Essential (primary) hypertension: Secondary | ICD-10-CM | POA: Insufficient documentation

## 2013-04-10 DIAGNOSIS — Z8709 Personal history of other diseases of the respiratory system: Secondary | ICD-10-CM | POA: Insufficient documentation

## 2013-04-10 DIAGNOSIS — Z8701 Personal history of pneumonia (recurrent): Secondary | ICD-10-CM | POA: Insufficient documentation

## 2013-04-10 DIAGNOSIS — Z8673 Personal history of transient ischemic attack (TIA), and cerebral infarction without residual deficits: Secondary | ICD-10-CM | POA: Insufficient documentation

## 2013-04-10 DIAGNOSIS — F17200 Nicotine dependence, unspecified, uncomplicated: Secondary | ICD-10-CM | POA: Insufficient documentation

## 2013-04-10 DIAGNOSIS — I619 Nontraumatic intracerebral hemorrhage, unspecified: Secondary | ICD-10-CM

## 2013-04-10 LAB — CBC
HCT: 40.9 % (ref 39.0–52.0)
Hemoglobin: 14.4 g/dL (ref 13.0–17.0)
MCH: 33.9 pg (ref 26.0–34.0)
MCHC: 35.2 g/dL (ref 30.0–36.0)
MCV: 96.2 fL (ref 78.0–100.0)
Platelets: 190 10*3/uL (ref 150–400)
RBC: 4.25 MIL/uL (ref 4.22–5.81)
RDW: 13.1 % (ref 11.5–15.5)
WBC: 7.2 10*3/uL (ref 4.0–10.5)

## 2013-04-10 LAB — BASIC METABOLIC PANEL
BUN: 10 mg/dL (ref 6–23)
CO2: 23 mEq/L (ref 19–32)
Calcium: 9.8 mg/dL (ref 8.4–10.5)
Chloride: 101 mEq/L (ref 96–112)
Creatinine, Ser: 0.89 mg/dL (ref 0.50–1.35)
GFR calc Af Amer: >90 mL/min (ref >90)
GFR calc non Af Amer: >90 mL/min (ref >90)
Glucose, Bld: 81 mg/dL (ref 70–99)
Potassium: 3.5 mEq/L (ref 3.5–5.1)
Sodium: 136 mEq/L (ref 135–145)

## 2013-04-10 LAB — POCT I-STAT TROPONIN I: Troponin i, poc: 0.02 ng/mL (ref 0.00–0.08)

## 2013-04-10 MED ORDER — CLONIDINE HCL 0.1 MG PO TABS
0.2000 mg | ORAL_TABLET | Freq: Three times a day (TID) | ORAL | Status: DC
  Administered 2013-04-10: 0.2 mg via ORAL
  Filled 2013-04-10: qty 2

## 2013-04-10 MED ORDER — HYDRALAZINE HCL 20 MG/ML IJ SOLN
10.0000 mg | INTRAMUSCULAR | Status: DC
  Filled 2013-04-10: qty 0.5

## 2013-04-10 MED ORDER — LABETALOL HCL 300 MG PO TABS
300.0000 mg | ORAL_TABLET | Freq: Three times a day (TID) | ORAL | Status: DC
  Administered 2013-04-10: 300 mg via ORAL
  Filled 2013-04-10 (×2): qty 1

## 2013-04-10 NOTE — Patient Instructions (Addendum)
Go to [facility] health department to see a primary care doctor  Go to emergency department today at [facility] to get your blood pressure managed

## 2013-04-10 NOTE — ED Provider Notes (Signed)
History    CSN: [id number] Arrival date & time 04/10/13  1640  First MD Initiated Contact with Patient 04/10/13 1659     Chief Complaint  Patient presents with  . Hypertension   (Consider location/radiation/quality/duration/timing/severity/associated sxs/prior Treatment) The history is provided by the patient and medical records. No language interpreter was used.    Rick Williams is a 47 y.o. male  with a hx of HTN, alcohol use and CVA on Jan 30, 2013 without residual deficits (2/2 to uncontrolled HTN) presents to the Emergency Department at the request of his doctor when he presented for a f/u appointment and was found to be hypertensive.  Pt was seeing Dr Claudette Laws for pain management today.  Pt states that he is supposed to take his BP medications 3x per day and has missed his lunchtime dose. Pt denies all symptoms and wishes to go home.   Nothing seems to makes his symptoms better or worse.  He states that he does not have a PCP.  He reports he has been taking his medications as directed.  Patient denies fever, chills, headache, neck pain, chest pain, shortness of breath, abdominal pain, nausea, vomiting, diarrhea, weakness, dizziness, syncope.   Past Medical History  Diagnosis Date  . Hypertension   . Pneumonia   . Hemothorax on left   . Stroke 2007    no deficits   History reviewed. No pertinent past surgical history. No family history on file. History  Substance Use Topics  . Smoking status: Current Some Day Smoker -- 1.00 packs/day    Types: Cigarettes  . Smokeless tobacco: Never Used  . Alcohol Use: Yes     Comment: occasionally     Review of Systems  Constitutional: Negative for fever, diaphoresis, appetite change, fatigue and unexpected weight change.  HENT: Negative for mouth sores and neck stiffness.   Eyes: Negative for visual disturbance.  Respiratory: Negative for cough, chest tightness, shortness of breath and wheezing.   Cardiovascular:  Negative for chest pain.  Gastrointestinal: Negative for nausea, vomiting, abdominal pain, diarrhea and constipation.  Endocrine: Negative for polydipsia, polyphagia and polyuria.  Genitourinary: Negative for dysuria, urgency, frequency and hematuria.  Musculoskeletal: Negative for back pain.  Skin: Negative for rash.  Allergic/Immunologic: Negative for immunocompromised state.  Neurological: Negative for syncope, light-headedness and headaches.  Hematological: Does not bruise/bleed easily.  Psychiatric/Behavioral: Negative for sleep disturbance. The patient is not nervous/anxious.     Allergies  Benadryl  Home Medications   Current Outpatient Rx  Name  Route  Sig  Dispense  Refill  . cloNIDine (CATAPRES) 0.2 MG tablet   Oral   Take 1 tablet (0.2 mg total) by mouth 3 (three) times daily.   90 tablet   1   . hydrochlorothiazide (HYDRODIURIL) 25 MG tablet   Oral   Take 1 tablet (25 mg total) by mouth daily.   30 tablet   1   . labetalol (NORMODYNE) 300 MG tablet   Oral   Take 1 tablet (300 mg total) by mouth 3 (three) times daily.   90 tablet   1    BP 129/95  Pulse 63  Temp(Src) 98.5 F (36.9 C) (Oral)  Resp 16  Ht 5\' 10"  (1.778 m)  Wt 153 lb (69.4 kg)  BMI 21.95 kg/m2  SpO2 99% Physical Exam  Nursing note and vitals reviewed. Constitutional: He is oriented to person, place, and time. He appears well-developed and well-nourished. No distress.  Awake, alert, nontoxic appearance  HENT:  Head: Normocephalic and atraumatic.  Mouth/Throat: Oropharynx is clear and moist. No oropharyngeal exudate.  Eyes: Conjunctivae and EOM are normal. Pupils are equal, round, and reactive to light. No scleral icterus.  Neck: Normal range of motion. Neck supple.  Cardiovascular: Normal rate, regular rhythm, normal heart sounds and intact distal pulses.   No murmur heard. Pulmonary/Chest: Effort normal and breath sounds normal. No respiratory distress. He has no wheezes. He has no  rales. He exhibits no tenderness.  Abdominal: Soft. Bowel sounds are normal. He exhibits no distension and no mass. There is no tenderness. There is no rebound and no guarding.  Musculoskeletal: Normal range of motion. He exhibits no edema.  Lymphadenopathy:    He has no cervical adenopathy.  Neurological: He is alert and oriented to person, place, and time. Coordination normal.  Speech is clear and goal oriented, follows commands Major Cranial nerves without deficit, no facial droop Normal strength in upper and lower extremities bilaterally including dorsiflexion and plantar flexion, strong and equal grip strength Sensation normal to light and sharp touch Moves extremities without ataxia Normal finger to nose and rapid alternating movements Normal gait and balance  Skin: Skin is warm and dry. No rash noted. He is not diaphoretic. No erythema.  Psychiatric: He has a normal mood and affect.    ED Course  Procedures (including critical care time) Labs Reviewed  CBC  BASIC METABOLIC PANEL  POCT I-STAT TROPONIN I   ECG:  Date: 04/10/2013  Rate: 65  Rhythm: normal sinus rhythm  QRS Axis: normal  Intervals: normal  ST/T Wave abnormalities: LVH  Conduction Disutrbances:none  Narrative Interpretation: Nonischemic ECG, unchanged from previous on 01/30/2013  Old EKG Reviewed: unchanged    No results found. 1. HTN (hypertension)     MDM  [name] presents with asymptomatic hypertension. Patient found to be hypertensive to 170/120 on arrival.  Patient denies all associated symptoms and I doubt hypertensive urgency.  Patient without neurologic deficits, no evidence of CVA. Lung sounds clear and equal, evidence of pulmonary edema.  ECG nonischemic. Will check labs and give her medications.  7:29 PM Patient negative troponin, CBC and BMP unremarkable with negative ECG.  Patient remains asymptomatic at this time. Blood pressure 129/95 after her medication administration. We'll  discharge home.  Her come in the patient continue to take his home medications as directed and followup with primary care physician. Patient given resource guide and referral to community wellness clinic.  I explained the diagnosis and have given explicit precautions to return to the ER including for any other new or worsening symptoms. The patient and his fatherunderstands and accepts the medical plan as it's been dictated and I have answered their questions. Discharge instructions concerning home care and home prescriptions have been given. The patient is STABLE and is discharged to home in good condition.  Dr. Jeraldine Loots was consulted, evaluated this patient with me and agrees with the plan.      Dahlia Client Josue Falconi, PA-C 04/10/13 1930

## 2013-04-10 NOTE — ED Provider Notes (Signed)
Medical screening examination/treatment/procedure(s) were conducted as a shared visit with non-physician practitioner(s) and myself.  I personally evaluated the patient during the encounter On my exam this M had no complaints, but did have HTN.  This improved w meds, and the patient was d/c in stable condition.  Gerhard Munch, MD 04/10/13 2213

## 2013-04-10 NOTE — Progress Notes (Signed)
Subjective:    Patient ID: Rick Williams, male    DOB: Williams 03, 1967, 47 y.o.   MRN: [id number]  HPI This is a 47 year old right-handed male  with history of hypertension and alcohol use as well as CVA without  apparent residual deficits. He was independent prior to admission  working full time. Patient admitted on Jan 30, 2013 with left-sided  weakness and altered mental status. Reports that the patient had not  been taking his blood pressure pills for the last 3 months. He was  found on the floor by his wife. Blood pressure was 190/110. Emergent  cranial CT scan showed large right ICH centered around both basal  ganglia with intraventricular extension and 8 mm right to left midline  shift. The patient was not a tPA candidate secondary to ICH. Placed on  nicardipine drip for blood pressure control. Neurology Services advised  conservative care. Followup cranial CT scan without change.  Finished outpatient PT and OT at [facility]. Has not established with a primary care physician since discharge from the hospital. Denies drinking any alcohol. Occasional smoking Has reported taking blood pressure medicines on a regular basis. Pain Inventory Average Pain 7 Pain Right Now 5 My pain is sharp  In the last 24 hours, has pain interfered with the following? General activity 10 Relation with others 10 Enjoyment of life 10 What TIME of day is your pain at its worst? night Sleep (in general) Poor  Pain is worse with: walking, bending and standing Pain improves with: medication Relief from Meds: 8  Mobility ability to climb steps?  no do you drive?  no needs help with transfers Do you have any goals in this area?  yes  Function not employed: date last employed na  Neuro/Psych trouble walking anxiety  Prior Studies Any changes since last visit?  no  Physicians involved in your care Any changes since last visit?  no   History reviewed. No pertinent family  history. History   Social History  . Marital Status: Married    Spouse Name: N/A    Number of Children: N/A  . Years of Education: N/A   Social History Main Topics  . Smoking status: Current Every Day Smoker -- 1.00 packs/day    Types: Cigarettes  . Smokeless tobacco: None  . Alcohol Use: Yes  . Drug Use: No     Comment: last use 2 months ago  . Sexually Active: None   Other Topics Concern  . None   Social History Narrative  . None   History reviewed. No pertinent past surgical history. Past Medical History  Diagnosis Date  . Hypertension   . Pneumonia   . Hemothorax on left   . Stroke 2007    no deficits   BP 170/117  Pulse 71  Resp 16  Ht 5\' 10"  (1.778 m)  Wt 153 lb (69.4 kg)  BMI 21.95 kg/m2  SpO2 96%  Recheck right arm 170/120 Left arm 168/111    Review of Systems  Respiratory: Positive for shortness of breath.   Musculoskeletal: Positive for gait problem.  Psychiatric/Behavioral: Positive for agitation.  All other systems reviewed and are negative.       Objective:   Physical Exam  Nursing note and vitals reviewed. Constitutional: He is oriented to person, place, and time. He appears well-developed and well-nourished.  HENT:  Head: Normocephalic and atraumatic.  Right Ear: External ear normal.  Left Ear: External ear normal.  Mouth/Throat: Oropharynx is clear and  moist.  Eyes: Conjunctivae and EOM are normal. Pupils are equal, round, and reactive to light.  Visual fields are intact No evidence of nystagmus  Neck: Normal range of motion.  Neurological: He is alert and oriented to person, place, and time. He has normal strength. He displays no atrophy and no tremor. No cranial nerve deficit or sensory deficit. He exhibits normal muscle tone. He displays no seizure activity. Coordination abnormal. Gait normal.  Reflex Scores:      Tricep reflexes are 1+ on the right side and 1+ on the left side.      Bicep reflexes are 1+ on the right side and  1+ on the left side.      Brachioradialis reflexes are 1+ on the right side and 1+ on the left side.      Patellar reflexes are 1+ on the right side and 1+ on the left side.      Achilles reflexes are 1+ on the right side and 1+ on the left side. Psychiatric: He has a normal mood and affect.          Assessment & Plan:  1. 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 Patient states he is taking his blood pressure medicines however blood pressure reading recorded today was significantly elevated. Recommend going across the street to [facility] emergency department for evaluation. His first measured blood pressure in the office today was similar to that measured during his stroke admission.  I brought the patient's father into the room and explain the situation. There is nothing we can do in this rehabilitation medicine office to address his blood pressure. The patient will go to [facility] emergency department.

## 2013-04-10 NOTE — ED Notes (Signed)
Pt presents with c/o high BP. Pt says he went to a doctor's office today and had his BP checked. Pt has a hx of HTN and takes meds for this but has not been home to take his meds today. Denies dizziness, blurred vision, or headache.

## 2013-05-20 ENCOUNTER — Emergency Department (HOSPITAL_COMMUNITY)
Admission: EM | Admit: 2013-05-20 | Discharge: 2013-05-20 | Disposition: A | Discharge status: Home / Self Care | Payer: Self-pay | Attending: Emergency Medicine | Admitting: Emergency Medicine

## 2013-05-20 ENCOUNTER — Encounter (HOSPITAL_COMMUNITY): Payer: Self-pay | Admitting: Emergency Medicine

## 2013-05-20 ENCOUNTER — Emergency Department (HOSPITAL_COMMUNITY): Payer: Self-pay

## 2013-05-20 DIAGNOSIS — Z8673 Personal history of transient ischemic attack (TIA), and cerebral infarction without residual deficits: Secondary | ICD-10-CM | POA: Insufficient documentation

## 2013-05-20 DIAGNOSIS — Z8679 Personal history of other diseases of the circulatory system: Secondary | ICD-10-CM | POA: Insufficient documentation

## 2013-05-20 DIAGNOSIS — R51 Headache: Secondary | ICD-10-CM | POA: Insufficient documentation

## 2013-05-20 DIAGNOSIS — F17200 Nicotine dependence, unspecified, uncomplicated: Secondary | ICD-10-CM | POA: Insufficient documentation

## 2013-05-20 DIAGNOSIS — Z8709 Personal history of other diseases of the respiratory system: Secondary | ICD-10-CM | POA: Insufficient documentation

## 2013-05-20 DIAGNOSIS — Z79899 Other long term (current) drug therapy: Secondary | ICD-10-CM | POA: Insufficient documentation

## 2013-05-20 DIAGNOSIS — I10 Essential (primary) hypertension: Secondary | ICD-10-CM | POA: Insufficient documentation

## 2013-05-20 DIAGNOSIS — Z8701 Personal history of pneumonia (recurrent): Secondary | ICD-10-CM | POA: Insufficient documentation

## 2013-05-20 LAB — BASIC METABOLIC PANEL
BUN: 10 mg/dL (ref 6–23)
CO2: 24 mEq/L (ref 19–32)
Calcium: 9 mg/dL (ref 8.4–10.5)
Chloride: 102 mEq/L (ref 96–112)
Creatinine, Ser: 0.96 mg/dL (ref 0.50–1.35)
GFR calc Af Amer: >90 mL/min (ref >90)
GFR calc non Af Amer: >90 mL/min (ref >90)
Glucose, Bld: 95 mg/dL (ref 70–99)
Potassium: 3.7 mEq/L (ref 3.5–5.1)
Sodium: 138 mEq/L (ref 135–145)

## 2013-05-20 MED ORDER — SODIUM CHLORIDE 0.9 % IV SOLN
1000.0000 mL | Freq: Once | INTRAVENOUS | Status: AC
  Administered 2013-05-20: 1000 mL via INTRAVENOUS

## 2013-05-20 MED ORDER — SODIUM CHLORIDE 0.9 % IV SOLN: 1000.0000 mL | INTRAVENOUS | Status: DC

## 2013-05-20 MED ORDER — METOCLOPRAMIDE HCL 10 MG PO TABS: 10.0000 mg | ORAL_TABLET | Freq: Four times a day (QID) | ORAL | Status: DC | PRN

## 2013-05-20 MED ORDER — METOCLOPRAMIDE HCL 5 MG/ML IJ SOLN
10.0000 mg | Freq: Once | INTRAMUSCULAR | Status: AC
  Administered 2013-05-20: 10 mg via INTRAVENOUS
  Filled 2013-05-20: qty 2

## 2013-05-20 NOTE — ED Provider Notes (Signed)
CSN: [id number]     Arrival date & time 05/20/13  0150 History   First MD Initiated Contact with Patient 05/20/13 0203     Chief Complaint  Patient presents with  . Headache   (Consider location/radiation/quality/duration/timing/severity/associated sxs/prior Treatment) Patient is a 47 y.o. male presenting with headaches. The history is provided by the patient.  Headache He had onset about 2 hours ago of severe bifrontal headache. Headache is achy in nature and he rates it at 10/10. It is worse with exposure to light. There is no associated nausea or vomiting. He denies any blurred vision. Denies any weakness, numbness, tingling. He states that headache is similar to what he had with a hemorrhagic stroke. He states he has been compliant with his blood pressure medication but does not take his blood pressure at home. He still smokes about one pack of cigarettes every 3 days. Nothing makes his headache any better. Has not taken any medication at home prior to coming to the ED.  Past Medical History  Diagnosis Date  . Hypertension   . Pneumonia   . Hemothorax on left   . Stroke 2007    no deficits  . Aneurysm    History reviewed. No pertinent past surgical history. No family history on file. History  Substance Use Topics  . Smoking status: Current Some Day Smoker -- 1.00 packs/day    Types: Cigarettes  . Smokeless tobacco: Never Used  . Alcohol Use: Yes     Comment: occasionally     Review of Systems  Neurological: Positive for headaches.  All other systems reviewed and are negative.    Allergies  Benadryl  Home Medications   Current Outpatient Rx  Name  Route  Sig  Dispense  Refill  . cloNIDine (CATAPRES) 0.2 MG tablet   Oral   Take 1 tablet (0.2 mg total) by mouth 3 (three) times daily.   90 tablet   1   . hydrochlorothiazide (HYDRODIURIL) 25 MG tablet   Oral   Take 1 tablet (25 mg total) by mouth daily.   30 tablet   1   . labetalol (NORMODYNE) 300 MG tablet  Oral   Take 1 tablet (300 mg total) by mouth 3 (three) times daily.   90 tablet   1    BP 158/107  Temp(Src) 98.5 F (36.9 C) (Oral)  Resp 20  Ht 5\' 10"  (1.778 m)  Wt 160 lb (72.576 kg)  BMI 22.96 kg/m2  SpO2 95% Physical Exam  Nursing note and vitals reviewed.  47 year old male, resting comfortably and in no acute distress. Vital signs are significant for hypertension with blood pressure 158/107. Oxygen saturation is 95%, which is normal. Head is normocephalic and atraumatic. PERRLA, EOMI. Oropharynx is clear. Fundi show no hemorrhage, exudate, or papilledema. Photophobia is noted headache with moderate hypertension. Neck is nontender and supple without adenopathy or JVD. Back is nontender and there is no CVA tenderness. Lungs are clear without rales, wheezes, or rhonchi. Chest is nontender. Heart has regular rate and rhythm without murmur. Abdomen is soft, flat, nontender without masses or hepatosplenomegaly and peristalsis is normoactive. Extremities have no cyanosis or edema, full range of motion is present. Skin is warm and dry without rash. Neurologic: Mental status is normal, cranial nerves are intact, there are no motor or sensory deficits.  ED Course  Procedures (including critical care time) Results for orders placed during the hospital encounter of 05/20/13  BASIC METABOLIC PANEL  Result Value Range   Sodium 138  135 - 145 mEq/L   Potassium 3.7  3.5 - 5.1 mEq/L   Chloride 102  96 - 112 mEq/L   CO2 24  19 - 32 mEq/L   Glucose, Bld 95  70 - 99 mg/dL   BUN 10  6 - 23 mg/dL   Creatinine, Ser 4.54  0.50 - 1.35 mg/dL   Calcium 9.0  8.4 - 09.8 mg/dL   GFR calc non Af Amer >90  >90 mL/min   GFR calc Af Amer >90  >90 mL/min   Ct Head Wo Contrast  05/20/2013   *RADIOLOGY REPORT*  Clinical Data: Headache  CT HEAD WITHOUT CONTRAST  Technique:  Contiguous axial images were obtained from the base of the skull through the vertex without contrast.  Comparison: Prior CT  from 01/31/2013.  Findings: 3 ml large intraparenchymal hematoma within the region of the right basal ganglia has resolved.  A small amount of encephalomalacia is now seen within this region.  No acute intracranial hemorrhage or infarct identified.  No mass or midline shift.  No extra-axial fluid collection.  Gray-white matter differentiation is preserved.  Calvarium is intact.  Orbital soft tissues are normal.  Paranasal sinuses and mastoid air cells are clear.  IMPRESSION: Interval resolution of previously seen large parenchymal hemorrhage within the right frontal lobe with encephalomalacia now seen within this region.  No acute intracranial process identified.   Original Report Authenticated By: Rise Mu, M.D.      MDM   1. Headache   2. Hypertension    Recurrent headache. I doubt that headache is to do hypertension. Old records are reviewed and he was admitted to the hospital several months ago with a hemorrhagic stroke. Contrary to triage note, he was not diagnosed with an aneurysm. He'll be sent for CT scan to rule out recurrent intracranial bleeding and he will be given a headache cocktail.  CT shows no new bleeding. He got complete relief of headache with IV fluids, IV metoclopramide, IV diphenhydramine. He is discharged with prescription for metoclopramide. Blood pressure continues to be moderately elevated and he is advised to monitor it at home. He may need to adjust his antihypertensive medications if he stays persistently hypertensive.  Dione Booze, MD 05/20/13 [phone]

## 2013-05-20 NOTE — ED Notes (Signed)
Patient c/o frontal headache x 2 hours; states hurts the same as it did when he was diagnosed with aneurysm in June.

## 2013-05-20 NOTE — ED Notes (Signed)
Talking with patient- he denies any meds today other than those prescribed.  States he has taken as directed.  Patient reports he walked to the ED tonight, states he has not been to bed yet.

## 2013-05-20 NOTE — ED Notes (Signed)
Patient repeated discharge instructions back to this RN with clear understanding.  Does admit to ETOH earlier tonight when at a cook out at a friends house.

## 2014-04-22 ENCOUNTER — Other Ambulatory Visit: Payer: Self-pay

## 2014-04-22 ENCOUNTER — Encounter (HOSPITAL_COMMUNITY): Payer: Self-pay | Admitting: Emergency Medicine

## 2014-04-22 ENCOUNTER — Emergency Department (HOSPITAL_COMMUNITY): Payer: BC Managed Care – PPO

## 2014-04-22 ENCOUNTER — Emergency Department (HOSPITAL_COMMUNITY)
Admission: EM | Admit: 2014-04-22 | Discharge: 2014-04-22 | Disposition: A | Discharge status: Home / Self Care | Payer: BC Managed Care – PPO | Attending: Emergency Medicine | Admitting: Emergency Medicine

## 2014-04-22 DIAGNOSIS — Z8701 Personal history of pneumonia (recurrent): Secondary | ICD-10-CM | POA: Insufficient documentation

## 2014-04-22 DIAGNOSIS — Z8673 Personal history of transient ischemic attack (TIA), and cerebral infarction without residual deficits: Secondary | ICD-10-CM | POA: Insufficient documentation

## 2014-04-22 DIAGNOSIS — J029 Acute pharyngitis, unspecified: Secondary | ICD-10-CM | POA: Insufficient documentation

## 2014-04-22 DIAGNOSIS — R079 Chest pain, unspecified: Secondary | ICD-10-CM | POA: Insufficient documentation

## 2014-04-22 DIAGNOSIS — Z79899 Other long term (current) drug therapy: Secondary | ICD-10-CM | POA: Insufficient documentation

## 2014-04-22 DIAGNOSIS — Z792 Long term (current) use of antibiotics: Secondary | ICD-10-CM | POA: Insufficient documentation

## 2014-04-22 DIAGNOSIS — I10 Essential (primary) hypertension: Secondary | ICD-10-CM | POA: Insufficient documentation

## 2014-04-22 DIAGNOSIS — F17200 Nicotine dependence, unspecified, uncomplicated: Secondary | ICD-10-CM | POA: Insufficient documentation

## 2014-04-22 DIAGNOSIS — J159 Unspecified bacterial pneumonia: Secondary | ICD-10-CM | POA: Insufficient documentation

## 2014-04-22 DIAGNOSIS — J189 Pneumonia, unspecified organism: Secondary | ICD-10-CM

## 2014-04-22 HISTORY — DX: Nontraumatic intracerebral hemorrhage, unspecified: I61.9

## 2014-04-22 LAB — I-STAT TROPONIN, ED: Troponin i, poc: 0.01 ng/mL (ref 0.00–0.08)

## 2014-04-22 MED ORDER — LEVOFLOXACIN 750 MG PO TABS: 750.0000 mg | ORAL_TABLET | Freq: Every day | ORAL | Status: DC

## 2014-04-22 MED ORDER — LEVOFLOXACIN 750 MG PO TABS
750.0000 mg | ORAL_TABLET | Freq: Once | ORAL | Status: AC
  Administered 2014-04-22: 750 mg via ORAL
  Filled 2014-04-22: qty 1

## 2014-04-22 MED ORDER — ALBUTEROL SULFATE HFA 108 (90 BASE) MCG/ACT IN AERS
2.0000 | INHALATION_SPRAY | RESPIRATORY_TRACT | Status: AC
  Administered 2014-04-22: 2 via RESPIRATORY_TRACT
  Filled 2014-04-22: qty 6.7

## 2014-04-22 MED ORDER — IBUPROFEN 400 MG PO TABS
400.0000 mg | ORAL_TABLET | Freq: Once | ORAL | Status: AC
  Administered 2014-04-22: 400 mg via ORAL
  Filled 2014-04-22: qty 1

## 2014-04-22 MED ORDER — ACETAMINOPHEN 325 MG PO TABS
650.0000 mg | ORAL_TABLET | Freq: Once | ORAL | Status: AC
  Administered 2014-04-22: 650 mg via ORAL
  Filled 2014-04-22: qty 2

## 2014-04-22 NOTE — ED Notes (Signed)
Pt reports cough x 1 month.  Reports was on an antibiotic 2 weeks ago but still coughs and has chest pain.  Reports pain is worse with coughing.   Denies fever.

## 2014-04-22 NOTE — ED Provider Notes (Signed)
CSN: [id number]     Arrival date & time 04/22/14  1738 History   First MD Initiated Contact with Patient 04/22/14 1828     Chief Complaint  Patient presents with  . Chest Pain      HPI Pt was seen at 1835. Per pt, c/o gradual onset and persistence of constant "cough" for the past 1 week.  Has been associated with constant right sided chest "pain" as well as intermittent "wheezing." Pt continues to smoke cigarettes. States his PMD rx an antibiotic 1 month ago for "coughing" with transient improvement in his symptoms. Denies home fevers, no palpitations, no SOB, no abd pain, no N/V/D, no rash, no injury, no calf/LE pain or unilateral swelling.     Past Medical History  Diagnosis Date  . Hypertension   . Pneumonia   . Hemothorax on left   . Stroke 2007    no deficits  . Cerebral hemorrhage 2014   History reviewed. No pertinent past surgical history.  History  Substance Use Topics  . Smoking status: Current Some Day Smoker -- 1.00 packs/day    Types: Cigarettes  . Smokeless tobacco: Never Used  . Alcohol Use: Yes     Comment: occasionally     Review of Systems ROS: Statement: All systems negative except as marked or noted in the HPI; Constitutional: Negative for fever and chills. ; ; Eyes: Negative for eye pain, redness and discharge. ; ; ENMT: Negative for ear pain, hoarseness, nasal congestion, sinus pressure and sore throat. ; ; Cardiovascular: +CP. Negative for palpitations, diaphoresis, dyspnea and peripheral edema. ; ; Respiratory: +cough, wheezing. Negative for stridor. ; ; Gastrointestinal: Negative for nausea, vomiting, diarrhea, abdominal pain, blood in stool, hematemesis, jaundice and rectal bleeding. . ; ; Genitourinary: Negative for dysuria, flank pain and hematuria. ; ; Musculoskeletal: Negative for back pain and neck pain. Negative for swelling and trauma.; ; Skin: Negative for pruritus, rash, abrasions, blisters, bruising and skin lesion.; ; Neuro: Negative for headache,  lightheadedness and neck stiffness. Negative for weakness, altered level of consciousness , altered mental status, extremity weakness, paresthesias, involuntary movement, seizure and syncope.      Allergies  Benadryl  Home Medications   Prior to Admission medications   Medication Sig Start Date End Date Taking? Authorizing Provider  cloNIDine (CATAPRES) 0.1 MG tablet Take 0.1 mg by mouth 2 (two) times daily.   Yes Historical Provider, MD  metoprolol (LOPRESSOR) 100 MG tablet Take 100 mg by mouth 2 (two) times daily.   Yes Historical Provider, MD  sulfamethoxazole-trimethoprim (BACTRIM DS,SEPTRA DS) 800-160 MG per tablet Take 1 tablet by mouth 2 (two) times daily.   Yes Historical Provider, MD   BP 138/97  Pulse 108  Temp(Src) 100.8 F (38.2 C) (Oral)  Resp 28  Ht 5\' 10"  (1.778 m)  Wt 176 lb (79.833 kg)  BMI 25.25 kg/m2  SpO2 94% Physical Exam 1840: Physical examination:  Nursing notes reviewed; Vital signs and O2 SAT reviewed;  Constitutional: Well developed, Well nourished, Well hydrated, In no acute distress; Head:  Normocephalic, atraumatic; Eyes: EOMI, PERRL, No scleral icterus; ENMT: Mouth and pharynx normal, Mucous membranes moist; Neck: Supple, Full range of motion, No lymphadenopathy; Cardiovascular: Tachycardic rate and rhythm, No gallop; Respiratory: Breath sounds coarse & equal bilaterally, No wheezes.  Speaking full sentences with ease, Normal respiratory effort/excursion; Chest: Nontender, Movement normal; Abdomen: Soft, Nontender, Nondistended, Normal bowel sounds; Genitourinary: No CVA tenderness; Extremities: Pulses normal, No tenderness, No edema, No calf edema or asymmetry.;  Neuro: AA&Ox3, Major CN grossly intact.  Speech clear. No gross focal motor or sensory deficits in extremities.; Skin: Color normal, Warm, Dry.   ED Course  Procedures     EKG Interpretation   Date/Time:  Monday April 22 2014 19:57:16 EDT Ventricular Rate:  94 PR Interval:  147 QRS  Duration: 79 QT Interval:  356 QTC Calculation: 445 R Axis:   79 Text Interpretation:  Sinus rhythm Probable left atrial enlargement LVH  with secondary repol abnrm Nonspecific T wave abnormality When compared  with ECG of 05/15/2012 T wave abnormality more pronounced Confirmed by  [facility]  MD, Nicholos JohnsKATHLEEN [phone]([id number]) on 04/22/2014 8:12:49 PM      MDM  MDM Reviewed: previous chart, nursing note and vitals Reviewed previous: labs and ECG Interpretation: labs, ECG and x-ray   Results for orders placed during the hospital encounter of 04/22/14  I-STAT TROPOININ, ED      Result Value Ref Range   Troponin i, poc 0.01  0.00 - 0.08 ng/mL   Comment 3            Dg Chest 2 View 04/22/2014   CLINICAL DATA:  Cough for 1 month; fever  EXAM: CHEST  2 VIEW  COMPARISON:  Feb 04, 2013  FINDINGS: There is consolidation in the right base region. Lungs otherwise clear. The heart size and pulmonary vascularity are within normal limits. No adenopathy.  IMPRESSION: Right base infiltrate. Followup images to clearing advised. Elsewhere lungs clear.   Electronically Signed   By: Bretta BangWilliam  Woodruff M.D.   On: 04/22/2014 18:18    2030:  Doubt PE as cause for symptoms low risk Wells.  Doubt ACS as cause for symptoms with normal troponin and unchanged EKG from previous after 1 week of constant symptoms. APAP and motrin given for fever.  MDI treatment given with improvement in cough. Lungs coarse bilat, no wheezing, resps easy, Sats 96% R/A during my exam.  Pt states he feels "better" and wants to go home now. Will tx for CAP. Dx and testing d/w pt and family.  Questions answered.  Verb understanding, agreeable to d/c home with outpt f/u.    Samuel JesterKathleen Joseguadalupe Stan, DO 04/25/14 [phone]

## 2014-04-22 NOTE — Discharge Instructions (Signed)
°Emergency Department Resource Guide °1) Find a Doctor and Pay Out of Pocket °Although you won't have to find out who is covered by your insurance plan, it is a good idea to ask around and get recommendations. You will then need to call the office and see if the doctor you have chosen will accept you as a new patient and what types of options they offer for patients who are self-pay. Some doctors offer discounts or will set up payment plans for their patients who do not have insurance, but you will need to ask so you aren't surprised when you get to your appointment. ° °2) Contact Your Local Health Department °Not all health departments have doctors that can see patients for sick visits, but many do, so it is worth a call to see if yours does. If you don't know where your local health department is, you can check in your phone book. The CDC also has a tool to help you locate your state's health department, and many state websites also have listings of all of their local health departments. ° °3) Find a Walk-in Clinic °If your illness is not likely to be very severe or complicated, you may want to try a walk in clinic. These are popping up all over the country in pharmacies, drugstores, and shopping centers. They're usually staffed by nurse practitioners or physician assistants that have been trained to treat common illnesses and complaints. They're usually fairly quick and inexpensive. However, if you have serious medical issues or chronic medical problems, these are probably not your best option. ° °No Primary Care Doctor: °- Call [facility] at  832-8000 - they can help you locate a primary care doctor that  accepts your insurance, provides certain services, etc. °- Physician Referral Service- 1-800-533-3463 ° °Chronic Pain Problems: °Organization         Address  Phone   Notes  °[facility]  ([phone]) 297-2271 Patients need to be referred by their primary care doctor.  ° °Medication  Assistance: °Organization         Address  Phone   Notes  °[place] [facility] [address] °[place], [place] [address] (336) 641-8030 --Must be a resident of [place] °-- Must have NO insurance coverage whatsoever (no Medicaid/ Medicare, etc.) °-- The pt. MUST have a primary care doctor that directs their care regularly and follows them in the community °  °[facility]  (866) 331-1348   °[employer]  (888) 892-1162   ° °Agencies that provide inexpensive medical care: °Organization         Address  Phone   Notes  °[facility]  (336) 832-8035   °[facility]    (336) 832-7272   °[facility] [address] °[place], [place] [address] (336) 832-4777   °[facility] [address], °[place] (336) 271-4999   °[facility]    (336) 373-0678   °[facility]    (336) 272-1050   °[facility] ° [address], [place] Phone:  (336) 832-4444, Fax:  (336) 832-4440 Hours of Operation:  9 am - 6 pm, M-F.  Also accepts Medicaid/Medicare and self-pay.  °[facility] ° [address], [place] Phone: (336) [phone], Fax: (336) [phone]. Hours of Operation:  8:30 am - 5:30 pm, M-F.  Also accepts Medicaid and self-pay.  °[facility] [place] [address], [place] Phone: (336) 878-6027   °[facility] [address], [place], [place] (336)723-1848, Ext. 123 Mondays & Thursdays: 7-9 AM.  First 15 patients are seen on a first come, first serve basis. °  ° °Medicaid-accepting [place] Providers: ° °Organization         Address  Phone   Notes  °[facility] [address], [place] (336) 641-2100 Also accepts self-pay patients.  °[facility] [address], [place] ° (336) 856-9996   °[facility] [address], [place]  (336) 288-8857   °[facility] [address] [place] [address], [place] (336) 299-7000   °Veita Bland [address], [place]  ° (336) 373-1557 Only accepts [place] Access Medicaid patients after they have their name applied to their card.  ° °Self-Pay (no insurance) in [place]: ° °Organization         Address  Phone   Notes  °Sickle Cell Patients, [facility] [address], [place] (336) 832-1970   °[facility] [address], [place] (336) 832-4400   °[facility] ° [address], [place] (336) 992-4800   °[facility]/Dr. Osei-Bonsu ° [address] [place] [address], [place] or [address], [place] (336) [phone] Phone number for both [place] and [place] locations is the same.  °[facility] [address], [place] (336) 299-0000   °[facility] [address] [place] [address], [place] or [address] (336) 852-7530 °(336) 878-2260   °[facility] [address], [place] (336) 350-1642, phone; (336) 294-5005, fax Sees patients 1st and 3rd Saturday of every month.  Must not qualify for public or private insurance (i.e. Medicaid, Medicare, [place] Health Choice, Veterans' Benefits) • Household income should be no more than 200% of the poverty level •The clinic cannot treat you if you are pregnant or think you are pregnant • Sexually transmitted diseases are not treated at the clinic.  ° ° °Dental Care: °Organization         Address  Phone  Notes  °[place] [facility] [address], [place] (336) 641-6152 Accepts children up to age 21 who are enrolled in Medicaid or [place] Health Choice; pregnant women with a Medicaid card; and children who have applied for Medicaid or [place] Health Choice, but were declined, whose parents can pay a reduced fee at time of service.  °[place]  [facility] [place]  [address], [place] (336) 641-7733 Accepts children up to age 21 who are enrolled in Medicaid or [place] Health Choice; pregnant women with a Medicaid card; and children who have applied for Medicaid or [place] Health Choice, but were declined, whose parents can pay a reduced fee at time of service.  °[facility] ° [address], [place] (336) 641-4533 Patients are seen by appointment only. Walk-ins are not accepted. [facility] will see patients 18 years of age and older. °Monday - Tuesday (8am-5pm) °Most Wednesdays (8:30-5pm) °$30 per visit, cash only  °[facility] ° [address], [place] (336) [phone] Patients are seen by appointment only. Walk-ins are not accepted. [facility] will see patients 18 years of age and older. °One   Wednesday Evening (Monthly: Volunteer Based).  $30 per visit, cash only  °[facility]  (919) [phone] for adults; Children under age 4, call Graduate Pediatric Dentistry at ([phone]) [phone]. Children aged 4-14, please call ([phone]) [phone] to request a pediatric application. ° Dental services are provided in all areas of dental care including fillings, crowns and bridges, complete and partial dentures, implants, gum treatment, root canals, and extractions. Preventive care is also provided. Treatment is provided to both adults and children. °Patients are selected via a lottery and there is often a waiting list. °  °[facility] [address], °[place] ° (336) 763-8833 [email or web address].[email or web address] °  °[facility] [address], [place], [place] (336)723-1848, Ext. 123 Second and Fourth Thursday of each month, opens at 6:30 AM; Clinic ends at 9 AM.  Patients are seen on a first-come first-served basis, and a limited number are seen during each clinic.  ° °[facility] ° [address], [place], [place] (336) [phone]    Eligibility Requirements °You must have lived in [place], or [place] counties for at least the last three months. °  You cannot be eligible for state or federal sponsored healthcare insurance, including Veterans Administration, Medicaid, or Medicare. °  You generally cannot be eligible for healthcare insurance through your employer.  °  How to apply: °Eligibility screenings are held every Tuesday and Wednesday afternoon from 1:00 pm until 4:00 pm. You do not need an appointment for the interview!  °[facility] [address], [place], [place] 336-631-2330   °[facility]  336-342-8273   °[facility]  336-703-3100   °[facility]  336-570-6415   ° °Behavioral Health Resources in the Community: °Intensive Outpatient Programs °Organization         Address  Phone  Notes  °[facility] [address], [place], [place] 336-878-6098   °[facility] [address], [place], [place] 336-832-9800   °[facility] [address], [place], [place] ° 336-882-2125   °[facility] [address],  °[place], [place] 1-800-853-5163 or 336-641-4981   °Substance Abuse Resources °Organization         Address  Phone  Notes  °[facility]  336-882-2125   °[facility]  336-784-9470   °[facility]  336-285-9073   °[facility]  336-845-3988   °Residential & Outpatient Substance Abuse Program  1-800-659-3381   °Psychological Services °Organization         Address  Phone  Notes  °[facility]  336- 832-9600   °[facility]  336- 378-7881   °[facility] [address], [place] 1-800-853-5163 or 336-641-4981   ° °Mobile Crisis Teams °Organization         Address  Phone  Notes  °[facility], Mobile Crisis Care Unit  1-877-626-1772   °Assertive °[facility] ° [address]  [place], [place] 336-834-9664   °Sharon DeEsch [address] °[place] 336-554-5454   ° °Self-Help/Support Groups °Organization         Address  Phone             Notes  °[facility]  - variety of support groups  336- 373-1402 Call for more information  °Narcotics Anonymous (NA), [facility] [address], °[place]  2 meetings at this location  ° °  Residential Treatment Programs Organization         Address  Phone  Notes  [facility] [address],    [place]  4-680-321-2248   [facility]  [address] [place] [address], [place], [place] 048-889-1694   [facility] [address] [place], [place] 503-888-2800 Admissions: 8am-3pm M-F  [facility] [address]    [place], [place] 349-179-1505   [facility] [address] [place], [place], [place] [phone]   [facility] [address],  [place], [place] [phone]   [facility] Intensive Outpatient [address], [place], [place] 675-449-2010   [facility] [address]  [place], [place] 0-712-197-5883 or [phone]   [facility] [address], [place], [place] 830-940-7680 Accepts Medicaid  [facility] [address],  [place] 8-811-031-5945 Substance Abuse/Addiction Treatment   [facility]         Address  Phone  Notes  [facility]  (478) 134-6809   Angie Fava, PhD [address] [place], [place]   720-307-6360 or (779) 137-0393   [facility]   [address] [place], [place] [phone]   [facility] [address] [place], [place] [phone] Insurance/Medicaid/sponsorship through [facility] [address]                                    [place], [place] (910)[phone] Therapy/tele-psych/case    [facility] [address], [place] 682-814-7091    Dr. Lolly Mustache  ([phone]   [facility]  [facility] Dept. 1) [address], [place] 2) [address], [place] 3)  [address], [place] [phone]  [phone]   [facility] Child Abuse Hotline 772-785-6570 or 2766505573 (After Hours)       Take the prescription as directed.  Use your albuterol inhaler (2 to 4 puffs) every 4 hours for the next 7 days, then as needed for cough, wheezing, or shortness of breath. Try to stop smoking. Take over the counter tylenol, as directed on packaging, as needed for fever or discomfort. Call your regular medical doctor tomorrow morning to schedule a follow up appointment within the next 2 days.  Return to the Emergency Department immediately sooner if worsening.

## 2014-06-18 IMAGING — CT CT HEAD W/O CM
1 series · 16 of 30 positions shown, 20 images · non-contrast
Comparison: CT head earlier in the day.

CLINICAL DATA: Follow-up hemorrhage. Altered mental status.
Hypertension.

CT HEAD WITHOUT CONTRAST
TECHNIQUE: Contiguous axial images were obtained from the base of
the skull through the vertex without contrast.

[Series 2: head routine 4.8 h37s · axial · 0.43mm/px · z∈[+1410,+1543]mm · 16 of 30 slices shown, 20 images]
[im 2/30  brain]
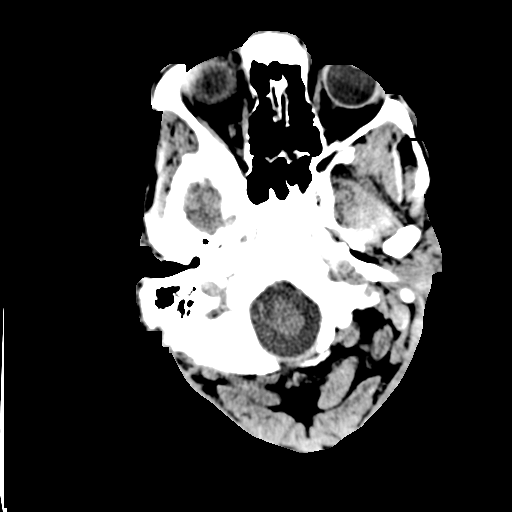
[im 2/30  bone]
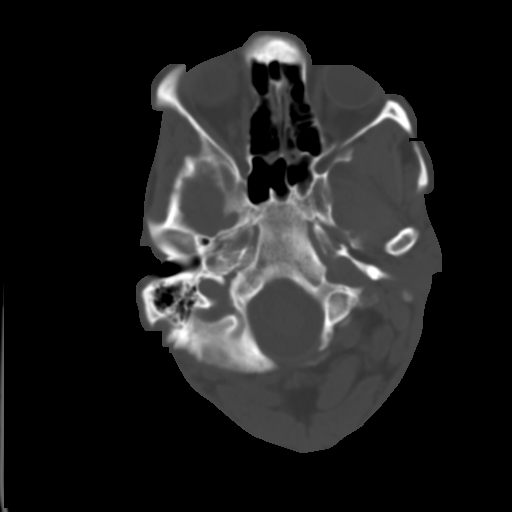
[im 4/30  brain]
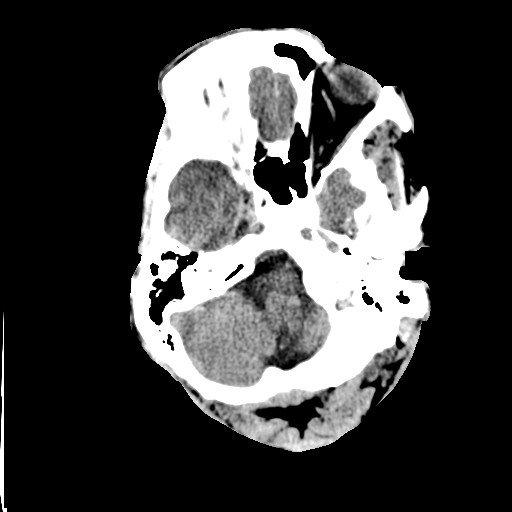
[im 6/30  brain]
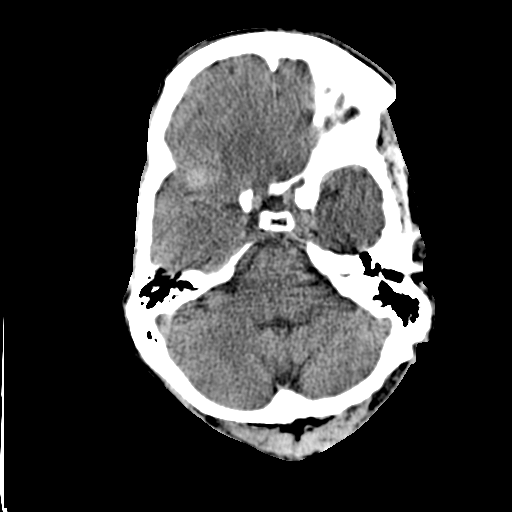
[im 8/30  brain]
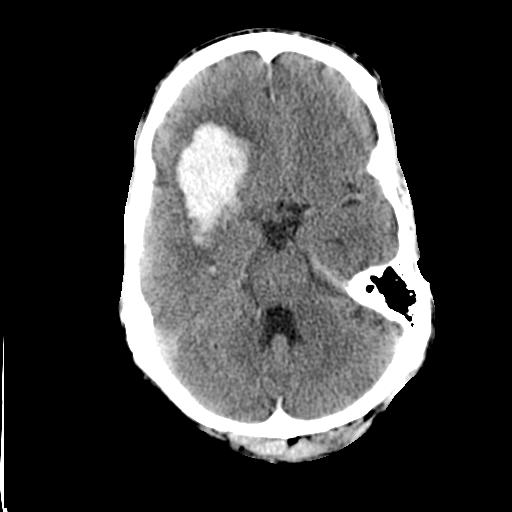
[im 9/30  brain]
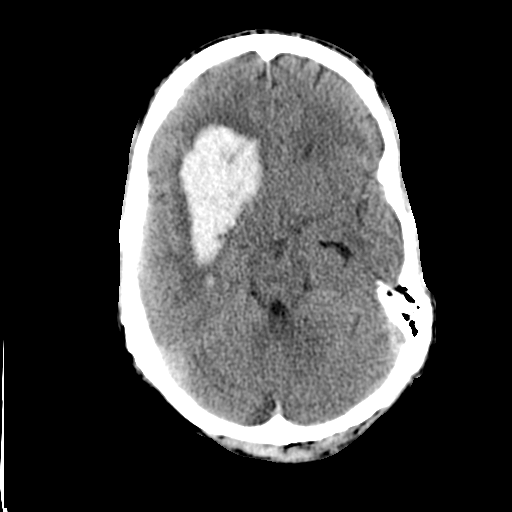
[im 9/30  bone]
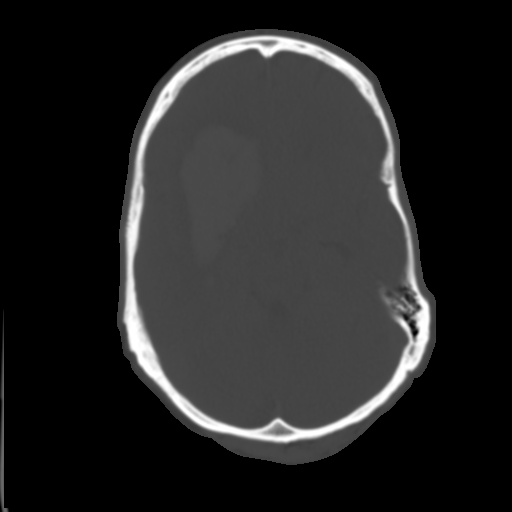
[im 11/30  brain]
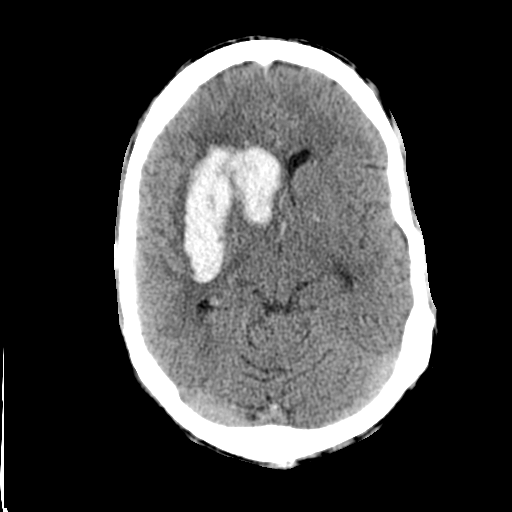
[im 13/30  brain]
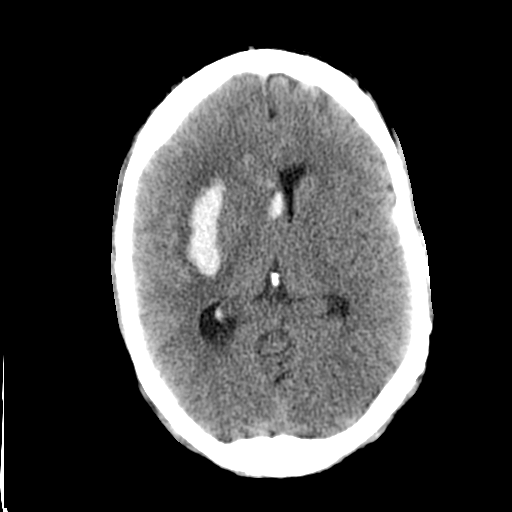
[im 15/30  brain]
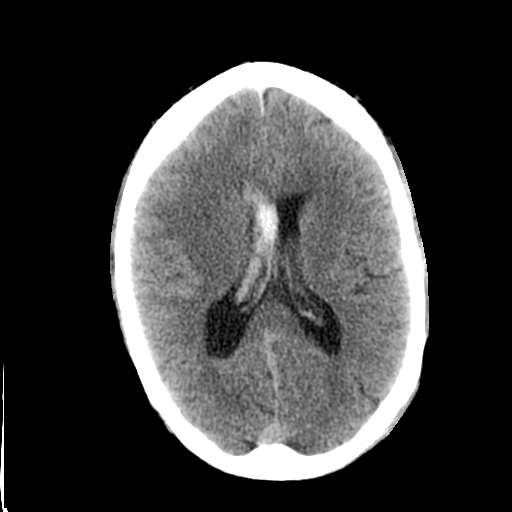
[im 16/30  brain]
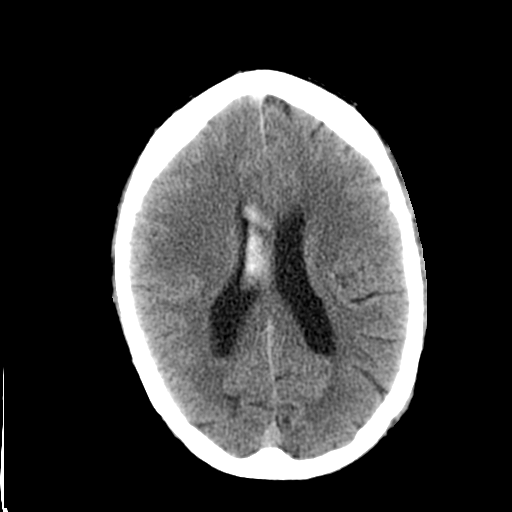
[im 16/30  bone]
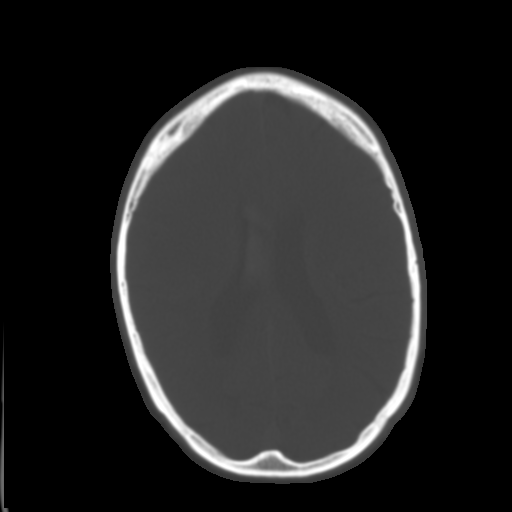
[im 18/30  brain]
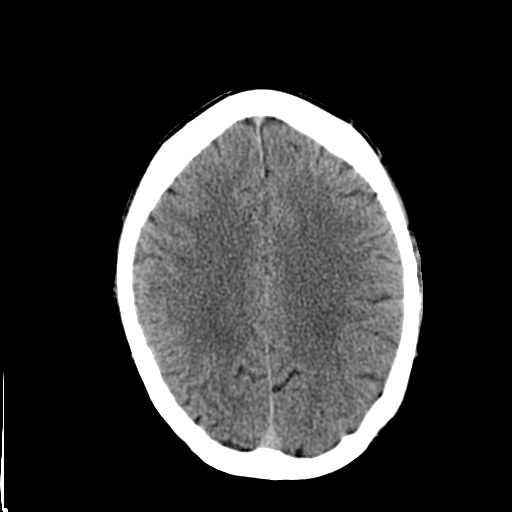
[im 20/30  brain]
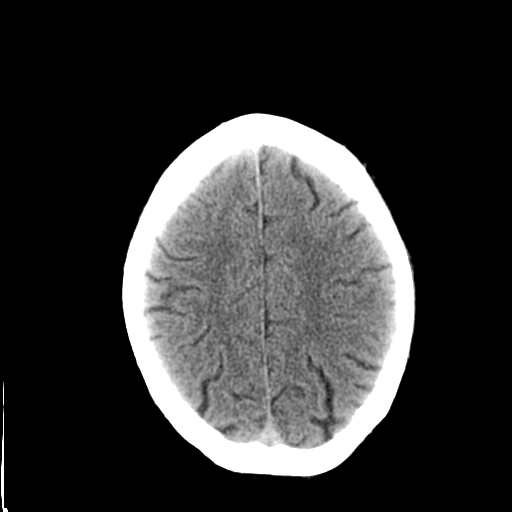
[im 22/30  brain]
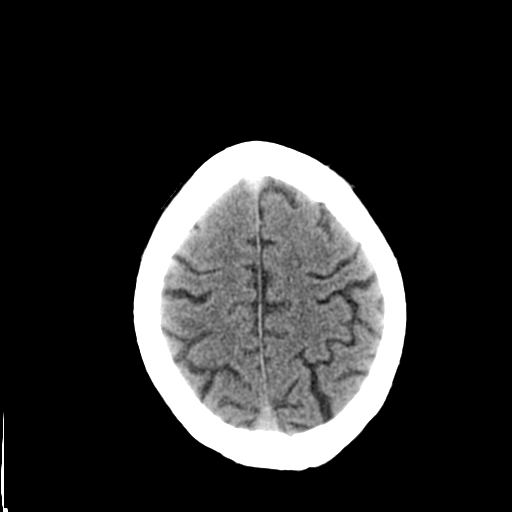
[im 23/30  brain]
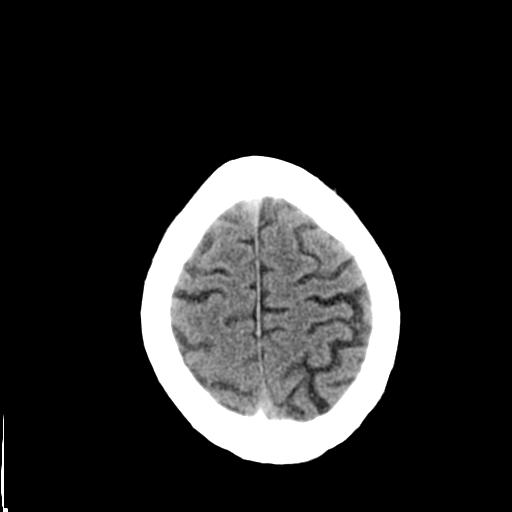
[im 23/30  bone]
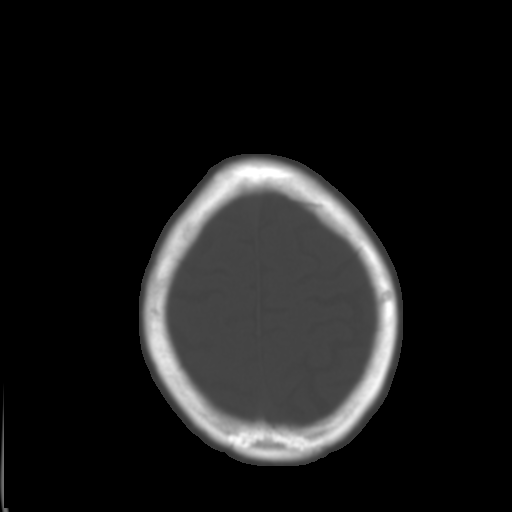
[im 25/30  brain]
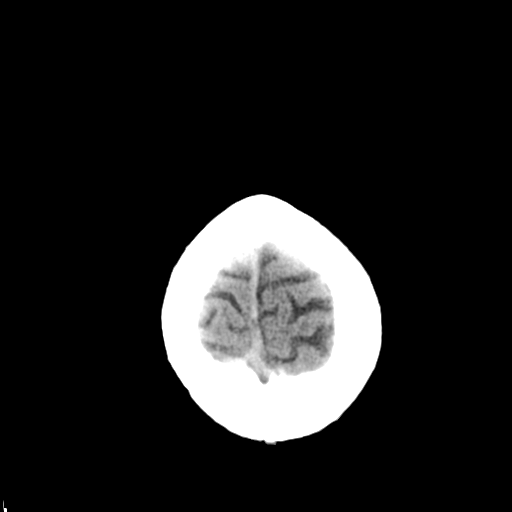
[im 27/30  brain]
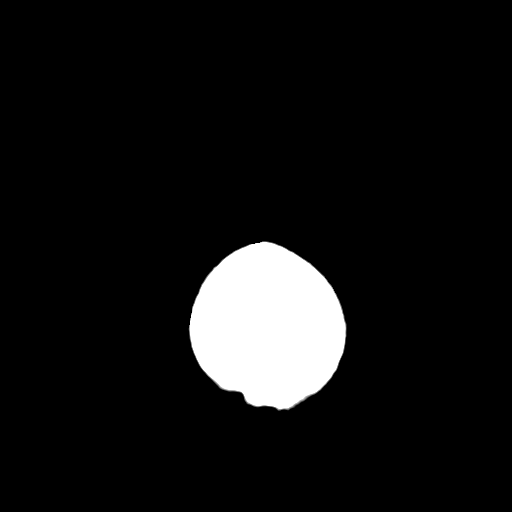
[im 29/30  brain]
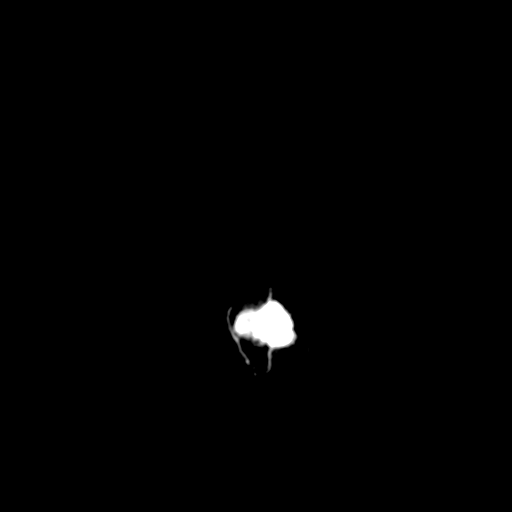

[16 of 30 positions shown; findings below may reference images not displayed]

FINDINGS: Slight increase size right hemisphere intraparenchymal
hemorrhage centered in the basal ganglia, now measuring 61 x 35 mm
cross-section.  Right-to-left shift of 7 mm probably unchanged from
priors accounting for slight differences in measurement.  Slight
intraventricular extension.  Left lateral ventricle at risk for
trapping.  Slight medial displacement of the uncus without frank
herniation.
IMPRESSION: Slight increased sized right basal ganglia intraparenchymal
hemorrhage, now 61 x 35 mm.  Unchanged 7 mm right-to-left shift.

## 2014-06-18 IMAGING — CR DG ABD PORTABLE 1V
1 series · 1 of 1 positions shown · non-contrast
Comparison: Portable exam 7805 hours compared to 90 [DATE]
Correlation:  CT abdomen and pelvis 05/31/2004

CLINICAL DATA: Stroke, OGT placement

PORTABLE ABDOMEN - 1 VIEW

[AP]
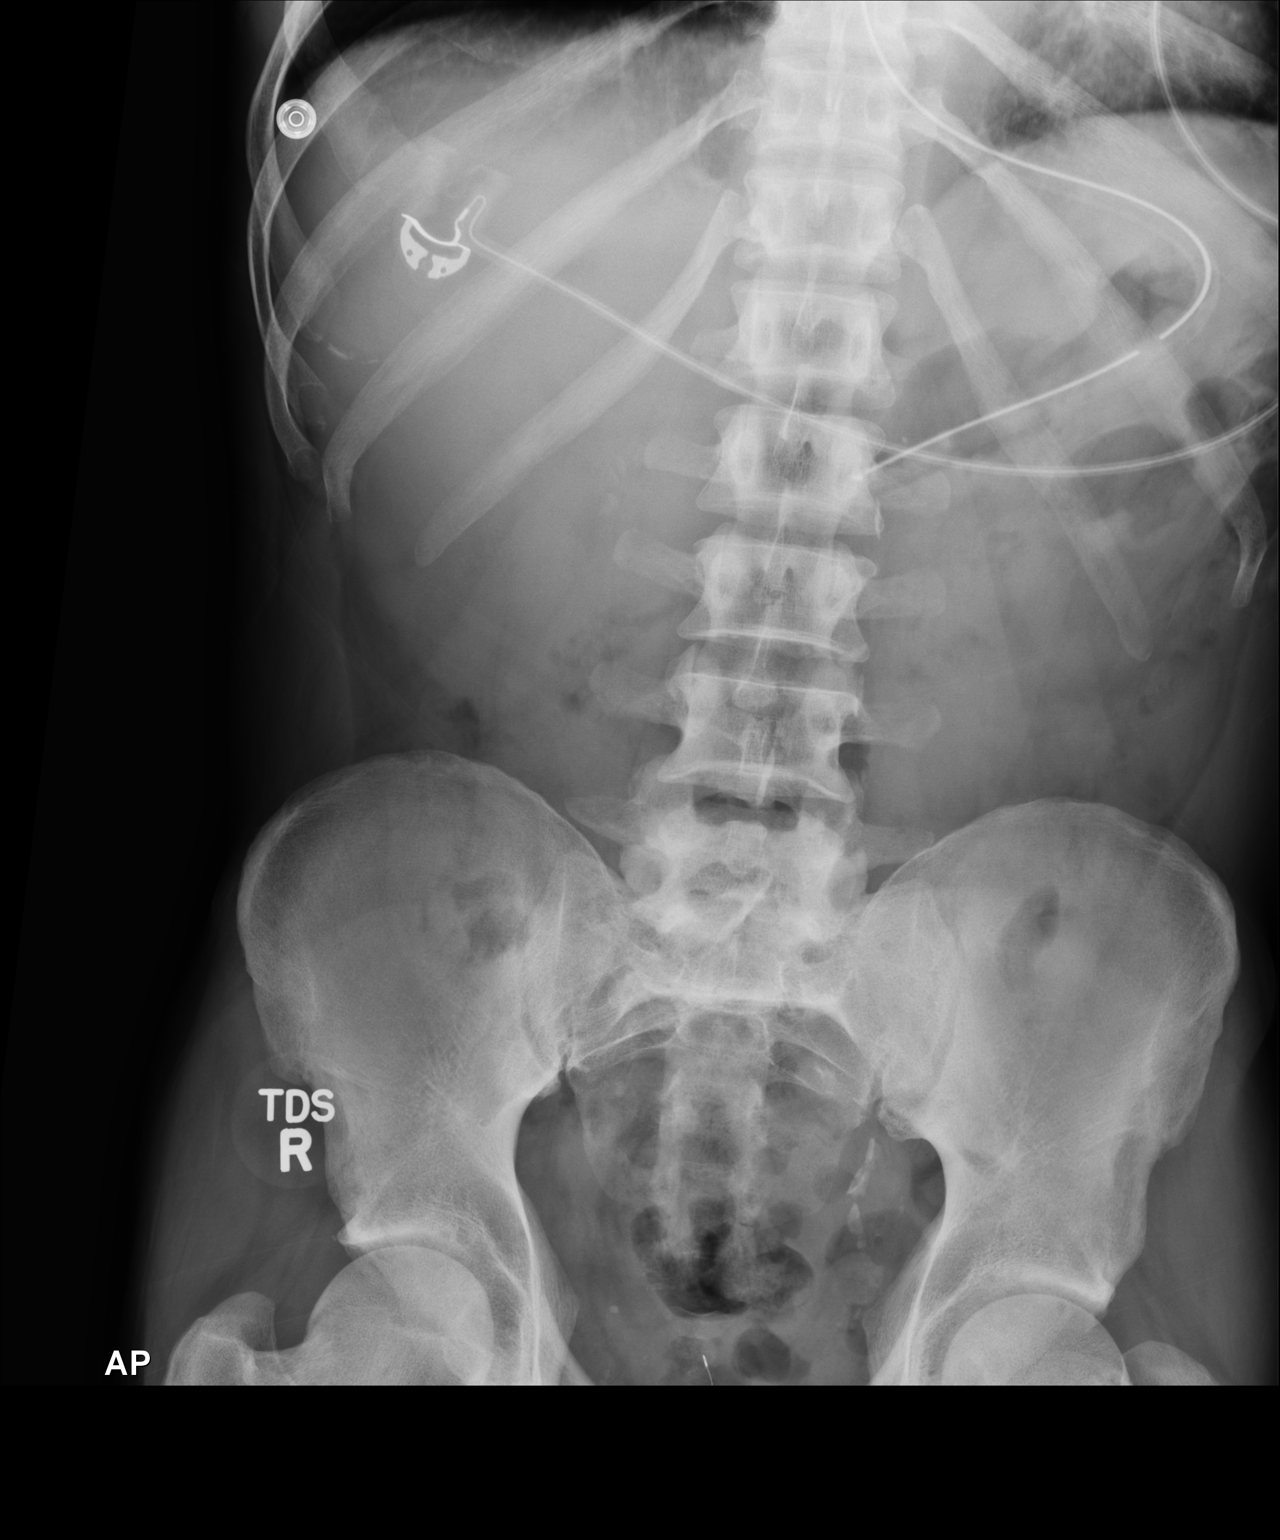

[1 of 1 positions shown; findings below may reference images not displayed]

FINDINGS: Tip of gastric tube projects over distal stomach.
Normal bowel gas pattern.
No bowel dilatation or bowel wall thickening.
Right upper quadrant calcifications, clustered, gallstones by prior
CT.
No definite urinary tract calcification.
Scattered vascular calcifications.
No acute osseous findings.
IMPRESSION: Tip of gastric tube projects over gastric antrum.
Cholelithiasis.

## 2014-06-18 IMAGING — CT CT HEAD W/O CM
1 of 2 series · 13 of 30 positions shown, 17 images · non-contrast
Comparison: None.

CLINICAL DATA: Code stroke, left-sided weakness.

CT HEAD WITHOUT CONTRAST
TECHNIQUE: Contiguous axial images were obtained from the base of
the skull through the vertex without contrast.

[Series 2: brain · axial · 0.49mm/px · z∈[+143,+277]mm · 13 of 32 slices shown, 17 images]
[im 3/32  brain]
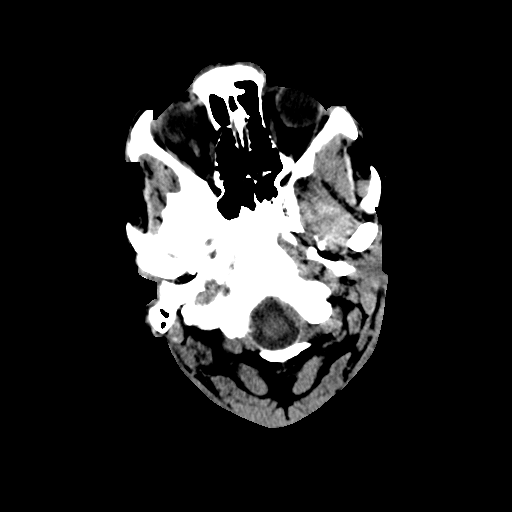
[im 3/32  bone]
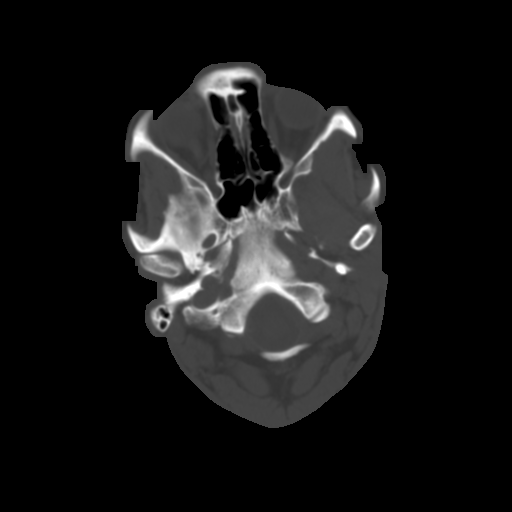
[im 5/32  brain]
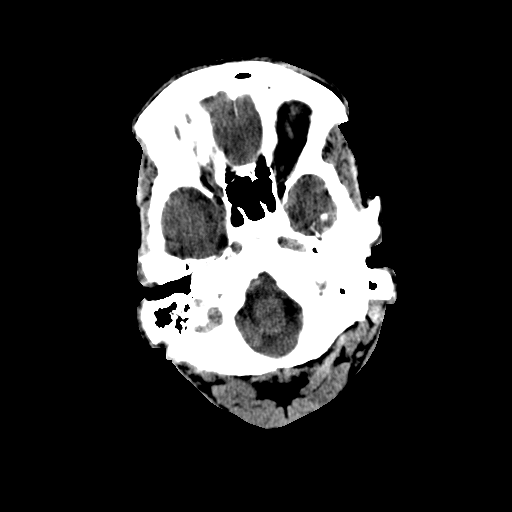
[im 7/32  brain]
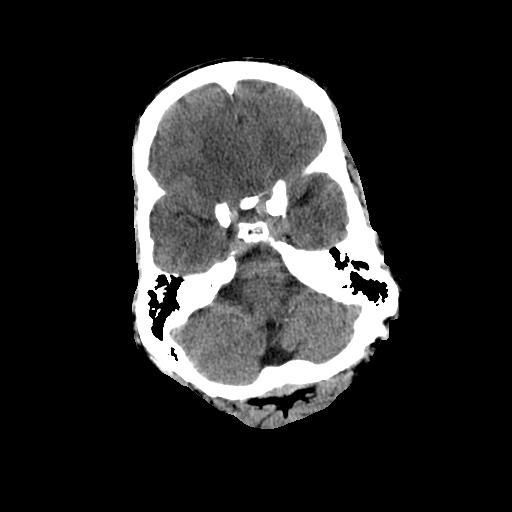
[im 9/32  brain]
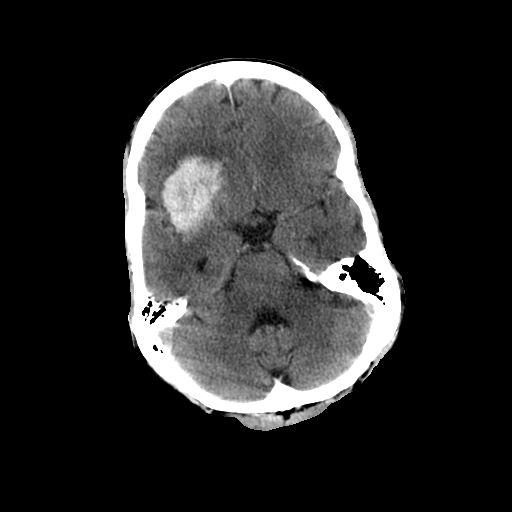
[im 12/32  brain]
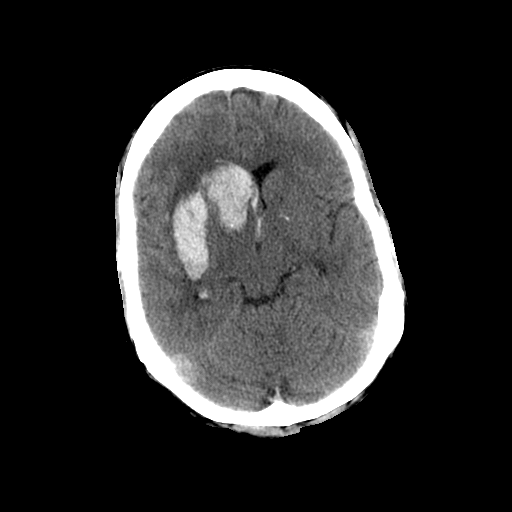
[im 12/32  bone]
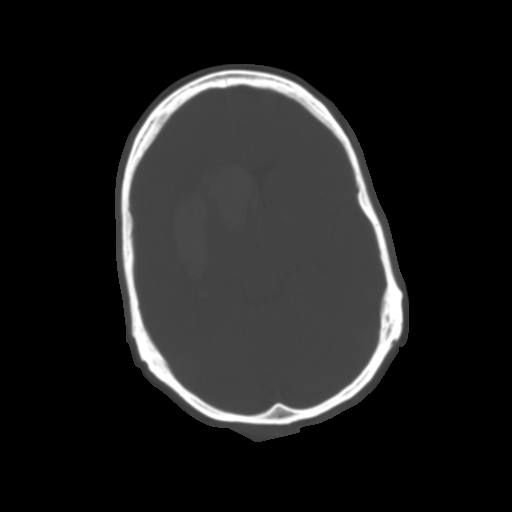
[im 14/32  brain]
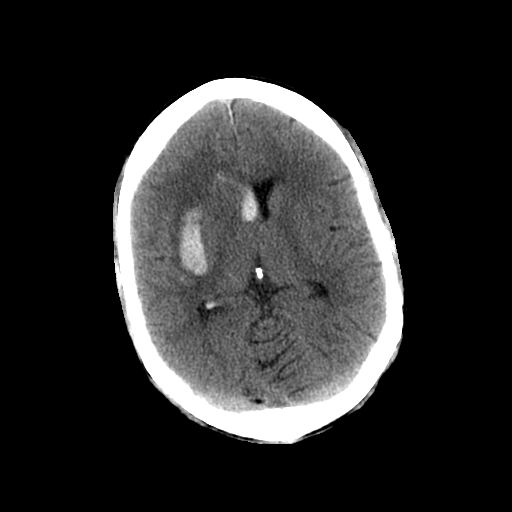
[im 16/32  brain]
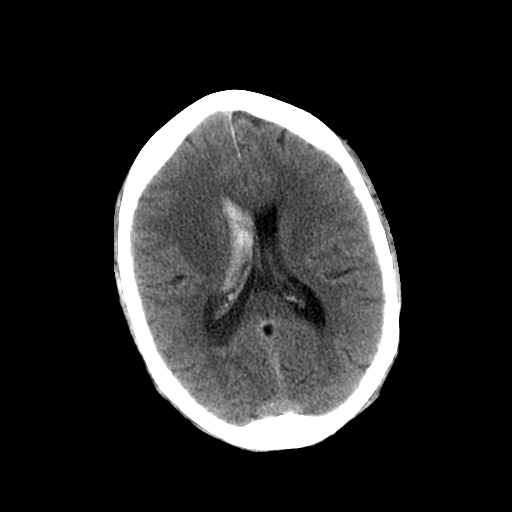
[im 18/32  brain]
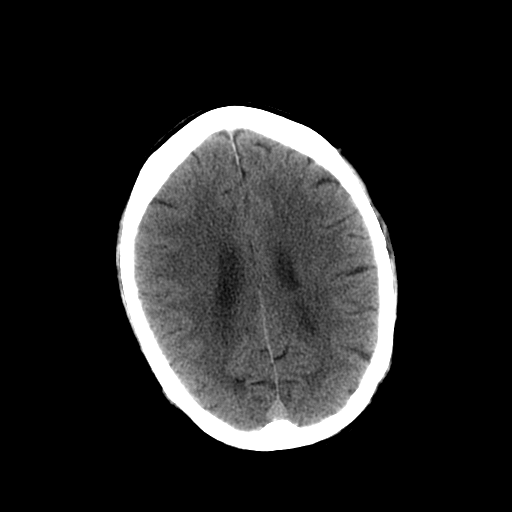
[im 20/32  brain]
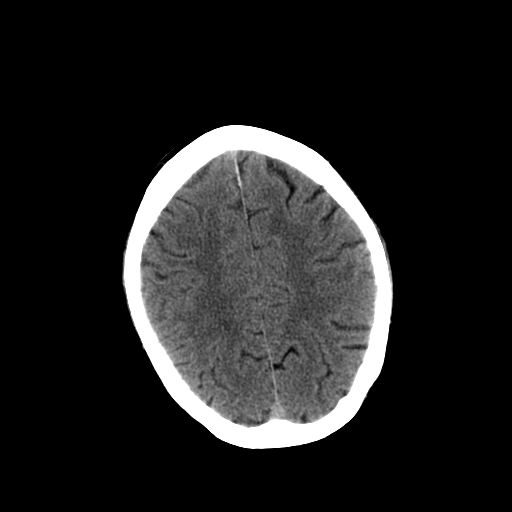
[im 20/32  bone]
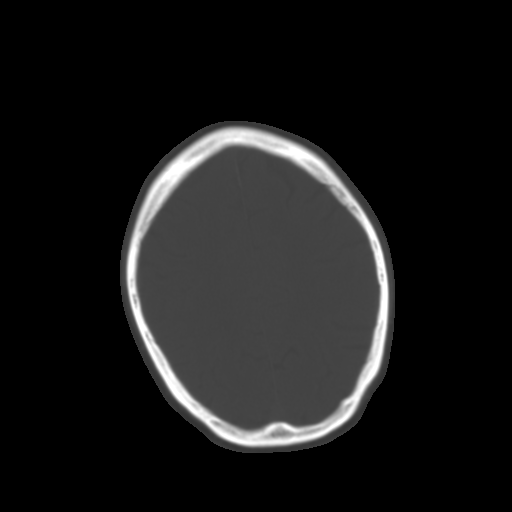
[im 23/32  brain]
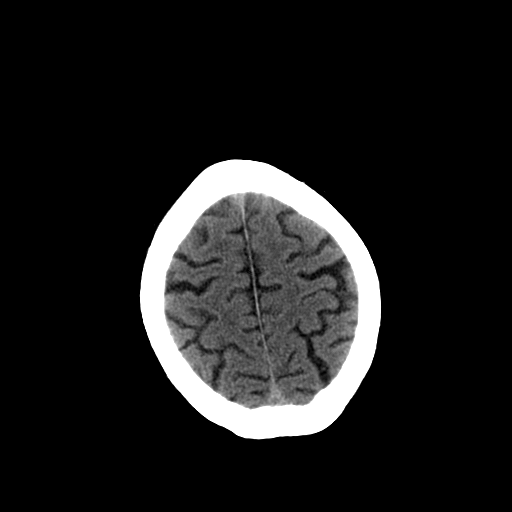
[im 25/32  brain]
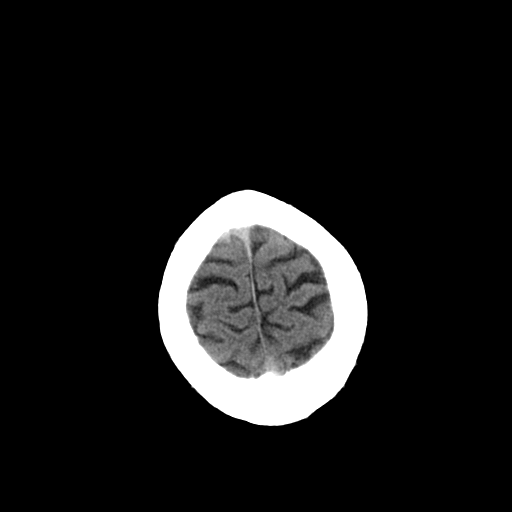
[im 27/32  brain]
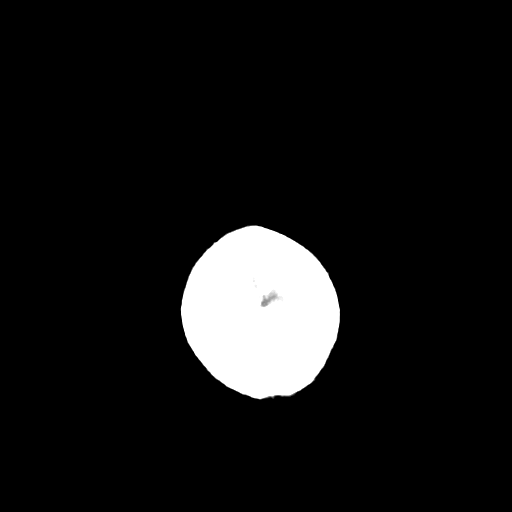
[im 29/32  brain]
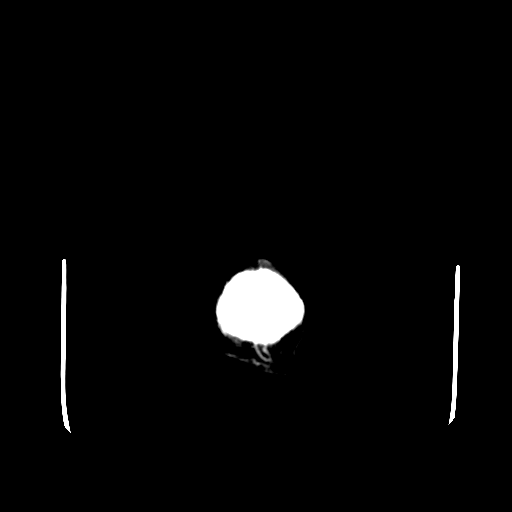
[im 29/32  bone]
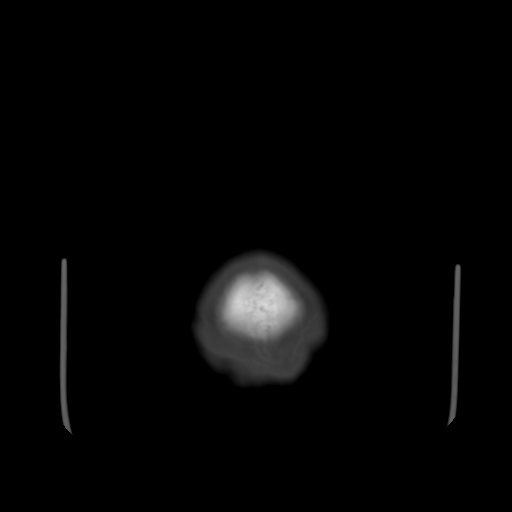

[13 of 30 positions shown; findings below may reference images not displayed]

FINDINGS: Intraparenchymal hemorrhage centered within the right
basal ganglia measures up to 5.3 cm in diameter.  There is mass
effect on the frontal horn of the right lateral ventricle and the
third ventricle as well as intraventricular extension of blood into
the right lateral ventricle and third ventricle. 8 mm of right-to-
left midline shift. Right cerebral sulcal effacement.  No overt
hydrocephalus at this time. The visualized paranasal sinuses and
mastoid air cells are predominately clear.
IMPRESSION: Intraparenchymal hemorrhage centered within the right basal
ganglia, with intraventricular extension and 8 mm right to left
midline shift.

Critical Value/emergent results were called by telephone at the
time of interpretation on 01/30/2013 at [DATE] a.m. to Dr. Don Lolito
[place], who verbally acknowledged these results.

## 2015-03-02 ENCOUNTER — Emergency Department (HOSPITAL_COMMUNITY)
Admission: EM | Admit: 2015-03-02 | Discharge: 2015-03-02 | Disposition: A | Discharge status: Home / Self Care | Payer: Self-pay | Attending: Emergency Medicine | Admitting: Emergency Medicine

## 2015-03-02 ENCOUNTER — Emergency Department (HOSPITAL_COMMUNITY): Payer: Self-pay

## 2015-03-02 ENCOUNTER — Encounter (HOSPITAL_COMMUNITY): Payer: Self-pay | Admitting: Emergency Medicine

## 2015-03-02 DIAGNOSIS — R42 Dizziness and giddiness: Secondary | ICD-10-CM | POA: Insufficient documentation

## 2015-03-02 DIAGNOSIS — Y9289 Other specified places as the place of occurrence of the external cause: Secondary | ICD-10-CM | POA: Insufficient documentation

## 2015-03-02 DIAGNOSIS — R51 Headache: Secondary | ICD-10-CM | POA: Insufficient documentation

## 2015-03-02 DIAGNOSIS — X30XXXA Exposure to excessive natural heat, initial encounter: Secondary | ICD-10-CM | POA: Insufficient documentation

## 2015-03-02 DIAGNOSIS — R61 Generalized hyperhidrosis: Secondary | ICD-10-CM | POA: Insufficient documentation

## 2015-03-02 DIAGNOSIS — Y9301 Activity, walking, marching and hiking: Secondary | ICD-10-CM | POA: Insufficient documentation

## 2015-03-02 DIAGNOSIS — Z8701 Personal history of pneumonia (recurrent): Secondary | ICD-10-CM | POA: Insufficient documentation

## 2015-03-02 DIAGNOSIS — R55 Syncope and collapse: Secondary | ICD-10-CM | POA: Insufficient documentation

## 2015-03-02 DIAGNOSIS — Z79899 Other long term (current) drug therapy: Secondary | ICD-10-CM | POA: Insufficient documentation

## 2015-03-02 DIAGNOSIS — Z8669 Personal history of other diseases of the nervous system and sense organs: Secondary | ICD-10-CM | POA: Insufficient documentation

## 2015-03-02 DIAGNOSIS — Z8673 Personal history of transient ischemic attack (TIA), and cerebral infarction without residual deficits: Secondary | ICD-10-CM | POA: Insufficient documentation

## 2015-03-02 DIAGNOSIS — I10 Essential (primary) hypertension: Secondary | ICD-10-CM | POA: Insufficient documentation

## 2015-03-02 DIAGNOSIS — Z8709 Personal history of other diseases of the respiratory system: Secondary | ICD-10-CM | POA: Insufficient documentation

## 2015-03-02 DIAGNOSIS — R112 Nausea with vomiting, unspecified: Secondary | ICD-10-CM | POA: Insufficient documentation

## 2015-03-02 DIAGNOSIS — Y998 Other external cause status: Secondary | ICD-10-CM | POA: Insufficient documentation

## 2015-03-02 LAB — CBC WITH DIFFERENTIAL/PLATELET
Basophils Absolute: 0 10*3/uL (ref 0.0–0.1)
Basophils Relative: 0 % (ref 0–1)
Eosinophils Absolute: 0 10*3/uL (ref 0.0–0.7)
Eosinophils Relative: 1 % (ref 0–5)
HCT: 43.7 % (ref 39.0–52.0)
Hemoglobin: 15.2 g/dL (ref 13.0–17.0)
Lymphocytes Relative: 26 % (ref 12–46)
Lymphs Abs: 2.2 10*3/uL (ref 0.7–4.0)
MCH: 37.1 pg — ABNORMAL HIGH (ref 26.0–34.0)
MCHC: 34.8 g/dL (ref 30.0–36.0)
MCV: 106.6 fL — ABNORMAL HIGH (ref 78.0–100.0)
Monocytes Absolute: 0.7 10*3/uL (ref 0.1–1.0)
Monocytes Relative: 8 % (ref 3–12)
Neutro Abs: 5.4 10*3/uL (ref 1.7–7.7)
Neutrophils Relative %: 65 % (ref 43–77)
Platelets: 130 10*3/uL — ABNORMAL LOW (ref 150–400)
RBC: 4.1 MIL/uL — ABNORMAL LOW (ref 4.22–5.81)
RDW: 14.7 % (ref 11.5–15.5)
WBC: 8.2 10*3/uL (ref 4.0–10.5)

## 2015-03-02 LAB — BASIC METABOLIC PANEL
Anion gap: 16 — ABNORMAL HIGH (ref 5–15)
BUN: 10 mg/dL (ref 6–20)
CO2: 22 mmol/L (ref 22–32)
Calcium: 8.6 mg/dL — ABNORMAL LOW (ref 8.9–10.3)
Chloride: 101 mmol/L (ref 101–111)
Creatinine, Ser: 1.15 mg/dL (ref 0.61–1.24)
GFR calc Af Amer: >60 mL/min (ref >60)
GFR calc non Af Amer: >60 mL/min (ref >60)
Glucose, Bld: 85 mg/dL (ref 65–99)
Potassium: 3.8 mmol/L (ref 3.5–5.1)
Sodium: 139 mmol/L (ref 135–145)

## 2015-03-02 LAB — CBG MONITORING, ED: Glucose-Capillary: 87 mg/dL (ref 65–99)

## 2015-03-02 MED ORDER — CLONIDINE HCL 0.1 MG PO TABS: 0.1000 mg | ORAL_TABLET | Freq: Two times a day (BID) | ORAL | Status: DC

## 2015-03-02 MED ORDER — SODIUM CHLORIDE 0.9 % IV BOLUS (SEPSIS)
1000.0000 mL | Freq: Once | INTRAVENOUS | Status: AC
  Administered 2015-03-02: 1000 mL via INTRAVENOUS

## 2015-03-02 MED ORDER — CLONIDINE HCL 0.1 MG PO TABS
0.1000 mg | ORAL_TABLET | Freq: Once | ORAL | Status: AC
  Administered 2015-03-02: 0.1 mg via ORAL
  Filled 2015-03-02: qty 1

## 2015-03-02 MED ORDER — ACETAMINOPHEN 500 MG PO TABS
1000.0000 mg | ORAL_TABLET | Freq: Once | ORAL | Status: AC
  Administered 2015-03-02: 1000 mg via ORAL
  Filled 2015-03-02: qty 2

## 2015-03-02 NOTE — ED Notes (Signed)
PT states no B/P medication in two days and states needs new prescriptions.

## 2015-03-02 NOTE — ED Notes (Signed)
PT states he was walking outside down the road and became nauseated and vomiting x1 then felt faint and sat down and got a ride to the hospital around 1200 today. PT sweating on arrival to ED and c/o HTN.

## 2015-03-02 NOTE — ED Provider Notes (Signed)
CSN: [id number]     Arrival date & time 03/02/15  1446 History   First MD Initiated Contact with Patient 03/02/15 1515     Chief Complaint  Patient presents with  . Heat Exposure     (Consider location/radiation/quality/duration/timing/severity/associated sxs/prior Treatment) HPI 49 year old male with a history of chronic hypertension presents after a near syncopal episode while he was outside. The patient was walking to his sister's house in over 90 heat and he started to feel dizzy had nausea and vomiting 1 and felt like he was about to pass out. He has then developed a severe left-sided headache. The patient did not actually pass out. No palpitations, chest pain, or shortness of breath. The patient has felt shaky and was sweating on arrival to triage. No weakness or numbness. The patient also is requesting a refill of his clonidine as he has run out of this medicine. He last took his metoprolol last night, states he only takes it once a day at night.  Past Medical History  Diagnosis Date  . Hypertension   . Pneumonia   . Hemothorax on left   . Stroke 2007    no deficits  . Cerebral hemorrhage 2014   History reviewed. No pertinent past surgical history. History reviewed. No pertinent family history. History  Substance Use Topics  . Smoking status: Current Some Day Smoker -- 1.00 packs/day    Types: Cigarettes  . Smokeless tobacco: Never Used  . Alcohol Use: Yes     Comment: once a week (3 days ago)    Review of Systems  Constitutional: Positive for diaphoresis.  Respiratory: Negative for shortness of breath.   Cardiovascular: Negative for chest pain.  Gastrointestinal: Positive for nausea and vomiting.  Neurological: Positive for dizziness and headaches. Negative for syncope.  All other systems reviewed and are negative.     Allergies  Benadryl  Home Medications   Prior to Admission medications   Medication Sig Start Date End Date Taking? Authorizing Provider   cloNIDine (CATAPRES) 0.1 MG tablet Take 0.1 mg by mouth 2 (two) times daily.   Yes Historical Provider, MD  DULERA 100-5 MCG/ACT AERO INHALE 2 PUFFS BY MOUTH TWICE DAILY. RINSE MOUTH AFTER EACH USE 01/21/15  Yes Historical Provider, MD  metoprolol (LOPRESSOR) 100 MG tablet Take 100 mg by mouth 2 (two) times daily.   Yes Historical Provider, MD  PROVENTIL HFA 108 (90 BASE) MCG/ACT inhaler INHALE 2 PUFFS BY MOUTH EVERY 4 TO 6 HOURS AS NEEDED FOR COUGHING, WHEEZING OR SHORTNESS OF BREATH. 01/21/15  Yes Historical Provider, MD   BP 176/121 mmHg  Pulse 93  Temp(Src) 98.8 F (37.1 C) (Oral)  Resp 24  SpO2 100% Physical Exam  Constitutional: He is oriented to person, place, and time. He appears well-developed and well-nourished.  HENT:  Head: Normocephalic and atraumatic.  Right Ear: External ear normal.  Left Ear: External ear normal.  Nose: Nose normal.  Eyes: EOM are normal. Pupils are equal, round, and reactive to light. Right eye exhibits no discharge. Left eye exhibits no discharge.  Neck: Normal range of motion. Neck supple.  Cardiovascular: Normal rate, regular rhythm, normal heart sounds and intact distal pulses.   No murmur heard. Pulmonary/Chest: Effort normal.  Abdominal: Soft. There is no tenderness.  Musculoskeletal: He exhibits no edema.  Neurological: He is alert and oriented to person, place, and time.  CN 2-12 grossly intact. 5/5 strength in all 4 extremities  Skin: Skin is warm and dry.  Nursing note and  vitals reviewed.   ED Course  Procedures (including critical care time) Labs Review Labs Reviewed  CBC WITH DIFFERENTIAL/PLATELET - Abnormal; Notable for the following:    RBC 4.10 (*)    MCV 106.6 (*)    MCH 37.1 (*)    Platelets 130 (*)    All other components within normal limits  BASIC METABOLIC PANEL - Abnormal; Notable for the following:    Calcium 8.6 (*)    Anion gap 16 (*)    All other components within normal limits  CBG MONITORING, ED    Imaging  Review Ct Head Wo Contrast  03/02/2015   CLINICAL DATA:  49 year old male with history of sudden onset of nausea and vomiting.  EXAM: CT HEAD WITHOUT CONTRAST  TECHNIQUE: Contiguous axial images were obtained from the base of the skull through the vertex without intravenous contrast.  COMPARISON:  Head CT 05/20/2013.  FINDINGS: Well-defined area of low attenuation in the right sub insular region, compatible with encephalomalacia related to resolve to hemorrhagic infarct (see CT scan of the head 01/31/2013). Small physiologic calcification in the left basal ganglia noted. No acute intracranial abnormalities. Specifically, no evidence of acute intracranial hemorrhage, no definite findings of acute/subacute cerebral ischemia, no mass, mass effect, hydrocephalus or abnormal intra or extra-axial fluid collections. Visualized paranasal sinuses and mastoids are well pneumatized. No acute displaced skull fractures are identified.  IMPRESSION: 1. No acute intracranial abnormalities. 2. 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   Electronically Signed   By: Trudie Reed M.D.   On: 03/02/2015 16:39     EKG Interpretation   Date/Time:  Sunday March 02 2015 15:31:48 EDT Ventricular Rate:  72 PR Interval:  144 QRS Duration: 75 QT Interval:  428 QTC Calculation: 468 R Axis:   88 Text Interpretation:  Sinus rhythm Probable left atrial enlargement  Probable left ventricular hypertrophy Nonspecific T abnormalities, lateral  leads Baseline wander in lead(s) II III aVF nonspecific T waves not as  severe as Aug 2015 Confirmed by Criss Alvine  MD, Yamilette Garretson [phone]) on 03/02/2015  3:47:11 PM      MDM   Final diagnoses:  Near syncope  Essential hypertension    I believe the patient's near syncopal episode was due to being out in the heat. He now feels normal after being observed in the ER with a fluid bolus. He was complaining of a sudden onset left-sided  headache that started a couple hours prior to arrival. Given that the CT is negative within 6 hours of his headache onset in combination with a normal neurologic exam and no meningismus I have extremely low suspicion for a subarachnoid hemorrhage. He is hypertensive but this is chronic. He has run out of his clonidine, given one dose in the ER and then will give a prescription. Follow-up with PCP.    Pricilla Loveless, MD 03/02/15 2147

## 2015-03-02 NOTE — ED Notes (Signed)
edp aware of bp.  Pt requesting bp pill prior to d/c.  Notified edp and med given.

## 2015-08-05 ENCOUNTER — Emergency Department (HOSPITAL_COMMUNITY)
Admission: EM | Admit: 2015-08-05 | Discharge: 2015-08-05 | Disposition: A | Discharge status: Home / Self Care | Payer: Self-pay | Attending: Emergency Medicine | Admitting: Emergency Medicine

## 2015-08-05 ENCOUNTER — Encounter (HOSPITAL_COMMUNITY): Payer: Self-pay | Admitting: *Deleted

## 2015-08-05 DIAGNOSIS — I10 Essential (primary) hypertension: Secondary | ICD-10-CM | POA: Insufficient documentation

## 2015-08-05 DIAGNOSIS — L309 Dermatitis, unspecified: Secondary | ICD-10-CM | POA: Insufficient documentation

## 2015-08-05 DIAGNOSIS — Z8701 Personal history of pneumonia (recurrent): Secondary | ICD-10-CM | POA: Insufficient documentation

## 2015-08-05 DIAGNOSIS — Z79899 Other long term (current) drug therapy: Secondary | ICD-10-CM | POA: Insufficient documentation

## 2015-08-05 DIAGNOSIS — F17210 Nicotine dependence, cigarettes, uncomplicated: Secondary | ICD-10-CM | POA: Insufficient documentation

## 2015-08-05 DIAGNOSIS — Z8673 Personal history of transient ischemic attack (TIA), and cerebral infarction without residual deficits: Secondary | ICD-10-CM | POA: Insufficient documentation

## 2015-08-05 DIAGNOSIS — Z8709 Personal history of other diseases of the respiratory system: Secondary | ICD-10-CM | POA: Insufficient documentation

## 2015-08-05 MED ORDER — IBUPROFEN 400 MG PO TABS
600.0000 mg | ORAL_TABLET | Freq: Once | ORAL | Status: AC
  Administered 2015-08-05: 600 mg via ORAL
  Filled 2015-08-05: qty 2

## 2015-08-05 MED ORDER — HYDROXYZINE HCL 25 MG PO TABS
50.0000 mg | ORAL_TABLET | Freq: Once | ORAL | Status: AC
  Administered 2015-08-05: 50 mg via ORAL
  Filled 2015-08-05: qty 2

## 2015-08-05 MED ORDER — HYDROXYZINE HCL 50 MG PO TABS: 50.0000 mg | ORAL_TABLET | Freq: Three times a day (TID) | ORAL | Status: DC | PRN

## 2015-08-05 MED ORDER — TRIAMCINOLONE ACETONIDE 0.1 % EX CREA: 1.0000 "application " | TOPICAL_CREAM | Freq: Two times a day (BID) | CUTANEOUS | Status: DC

## 2015-08-05 NOTE — ED Notes (Signed)
Pt c/o rash all over body that started x 2 days ago

## 2015-08-05 NOTE — Discharge Instructions (Signed)
Eczema Eczema, also called atopic dermatitis, is a skin disorder that causes inflammation of the skin. It causes a red rash and dry, scaly skin. The skin becomes very itchy. Eczema is generally worse during the cooler winter months and often improves with the warmth of summer. Eczema usually starts showing signs in infancy. Some children outgrow eczema, but it may last through adulthood.  CAUSES  The exact cause of eczema is not known, but it appears to run in families. People with eczema often have a family history of eczema, allergies, asthma, or hay fever. Eczema is not contagious. Flare-ups of the condition may be caused by:   Contact with something you are sensitive or allergic to.   Stress. SIGNS AND SYMPTOMS  Dry, scaly skin.   Red, itchy rash.   Itchiness. This may occur before the skin rash and may be very intense.  DIAGNOSIS  The diagnosis of eczema is usually made based on symptoms and medical history. TREATMENT  Eczema cannot be cured, but symptoms usually can be controlled with treatment and other strategies. A treatment plan might include:  Controlling the itching and scratching.   Use over-the-counter antihistamines as directed for itching. This is especially useful at night when the itching tends to be worse.   Use over-the-counter steroid creams as directed for itching.   Avoid scratching. Scratching makes the rash and itching worse. It may also result in a skin infection (impetigo) due to a break in the skin caused by scratching.   Keeping the skin well moisturized with creams every day. This will seal in moisture and help prevent dryness. Lotions that contain alcohol and water should be avoided because they can dry the skin.   Limiting exposure to things that you are sensitive or allergic to (allergens).   Recognizing situations that cause stress.   Developing a plan to manage stress.  HOME CARE INSTRUCTIONS   Only take over-the-counter or  prescription medicines as directed by your health care provider.   Do not use anything on the skin without checking with your health care provider.   Keep baths or showers short (5 minutes) in warm (not hot) water. Use mild cleansers for bathing. These should be unscented. You may add nonperfumed bath oil to the bath water. It is best to avoid soap and bubble bath.   Immediately after a bath or shower, when the skin is still damp, apply a moisturizing ointment to the entire body. This ointment should be a petroleum ointment. This will seal in moisture and help prevent dryness. The thicker the ointment, the better. These should be unscented.   Keep fingernails cut short. Children with eczema may need to wear soft gloves or mittens at night after applying an ointment.   Dress in clothes made of cotton or cotton blends. Dress lightly, because heat increases itching.   A child with eczema should stay away from anyone with fever blisters or cold sores. The virus that causes fever blisters (herpes simplex) can cause a serious skin infection in children with eczema. SEEK MEDICAL CARE IF:   Your itching interferes with sleep.   Your rash gets worse or is not better within 1 week after starting treatment.   You see pus or soft yellow scabs in the rash area.   You have a fever.   You have a rash flare-up after contact with someone who has fever blisters.    This information is not intended to replace advice given to you by your health care   provider. Make sure you discuss any questions you have with your health care provider.   Document Released: 09/03/2000 Document Revised: 06/27/2013 Document Reviewed: 04/09/2013 Elsevier Interactive Patient Education 2016 Else[employer] Inc[employer]  

## 2015-08-05 NOTE — ED Notes (Signed)
   08/05/15 0401  Skin Color/Condition  Skin Color/Condition (WDL) X  Skin Color Appropriate for ethnicity  Skin Integrity Abrasion;Cracking;Eczema  Skin Condition Dry  Skin Dry  Mucous Membranes Moist  Skin Turgor Non-tenting  Pt c/o itching and rash x 5 days.

## 2015-08-05 NOTE — ED Provider Notes (Signed)
CSN: [id number]     Arrival date & time 08/05/15  0348 History   First MD Initiated Contact with Patient 08/05/15 0358     Chief Complaint  Patient presents with  . Rash     (Consider location/radiation/quality/duration/timing/severity/associated sxs/prior Treatment) HPI  This is a 49 year old male with history of hypertension who presents with a rash. Patient reports 5 days of symptoms but worsening rash over the last 3 days. He states that it is itchy it is mostly on his legs and his torso. He denies any new soaps or detergents. He did move in with a new roommate 5 days ago. The roommate does not have any symptoms. Patient has taken Benadryl at home with minimal relief. He has not tried any ointments.  No known allergies. Patient denies systemic symptoms including fevers, chest pain, shortness of breath, wheezing.  Past Medical History  Diagnosis Date  . Hypertension   . Pneumonia   . Hemothorax on left   . Stroke [facility]) 2007    no deficits  . Cerebral hemorrhage (HCC) 2014   History reviewed. No pertinent past surgical history. History reviewed. No pertinent family history. Social History  Substance Use Topics  . Smoking status: Current Some Day Smoker -- 1.00 packs/day    Types: Cigarettes  . Smokeless tobacco: Never Used  . Alcohol Use: Yes     Comment: once a week (3 days ago)    Review of Systems  Constitutional: Negative for fever.  Respiratory: Negative for shortness of breath.   Cardiovascular: Negative for chest pain.  Skin: Positive for rash.       Itching  All other systems reviewed and are negative.     Allergies  Review of patient's allergies indicates no active allergies.  Home Medications   Prior to Admission medications   Medication Sig Start Date End Date Taking? Authorizing Provider  cloNIDine (CATAPRES) 0.1 MG tablet Take 1 tablet (0.1 mg total) by mouth 2 (two) times daily. 03/02/15   Pricilla Loveless, MD  DULERA 100-5 MCG/ACT AERO INHALE 2  PUFFS BY MOUTH TWICE DAILY. RINSE MOUTH AFTER [facility] USE 01/21/15   Historical Provider, MD  hydrOXYzine (ATARAX/VISTARIL) 50 MG tablet Take 1 tablet (50 mg total) by mouth every 8 (eight) hours as needed for itching. 08/05/15   Shon Baton, MD  metoprolol (LOPRESSOR) 100 MG tablet Take 100 mg by mouth 2 (two) times daily.    Historical Provider, MD  PROVENTIL HFA 108 (90 BASE) MCG/ACT inhaler INHALE 2 PUFFS BY MOUTH EVERY 4 TO 6 HOURS AS NEEDED FOR COUGHING, WHEEZING OR SHORTNESS OF BREATH. 01/21/15   Historical Provider, MD  triamcinolone cream (KENALOG) 0.1 % Apply 1 application topically 2 (two) times daily. 08/05/15   Shon Baton, MD   BP 183/127 mmHg  Pulse 93  Temp(Src) 98.3 F (36.8 C)  Resp 18  Ht 5\' 10"  (1.778 m)  Wt 173 lb (78.472 kg)  BMI 24.82 kg/m2  SpO2 96% Physical Exam  Constitutional: He is oriented to person, place, and time. He appears well-developed and well-nourished. No distress.  HENT:  Head: Normocephalic and atraumatic.  Mouth/Throat: Oropharynx is clear and moist.  Cardiovascular: Normal rate and regular rhythm.   Pulmonary/Chest: Effort normal. No respiratory distress.  Abdominal: Soft. There is no tenderness.  Musculoskeletal: He exhibits no edema.  Neurological: He is alert and oriented to person, place, and time.  Skin: Skin is warm and dry.  Dry cracked skin noted over the bilateral lower extremities, lower  trunk and back, there are discrete plaque-like lesions over the proximal thighs that are less than 2 cm in diameter, multiple excoriations noted secondary to scratching, no adjacent erythema  Psychiatric: He has a normal mood and affect.  Nursing note and vitals reviewed.   ED Course  Procedures (including critical care time) Labs Review Labs Reviewed - No data to display  Imaging Review No results found. I have personally reviewed and evaluated these images and lab results as part of my medical decision-making.   EKG  Interpretation None      MDM   Final diagnoses:  Dermatitis    Patient presents with a rash. No signs or symptoms of systemic illness. He has very dry skin.  Given this and the fact that he is African-American, the rash is somewhat difficult to characterize. However, given itchiness and diffuse nature, contact dermatitis and eczema are the top of the differential. No signs of overlying infection at this time; however, patient has multiple excoriations and is beginning to have some skin breakdown secondary to scratching. Discussed with patient that he would be at risk for infection. He was given Atarax. He was provided with a basic moisturizer and barrier cream. He will be discharged with topical steroid. He needs to follow-up with his primary physician. He was given return precautions including precautions regarding infection.  After history, exam, and medical workup I feel the patient has been appropriately medically screened and is safe for discharge home. Pertinent diagnoses were discussed with the patient. Patient was given return precautions.     Shon Baton, MD 08/05/15 [phone]

## 2016-03-04 ENCOUNTER — Emergency Department (HOSPITAL_COMMUNITY)
Admission: EM | Admit: 2016-03-04 | Discharge: 2016-03-04 | Disposition: A | Discharge status: Home / Self Care | Payer: Self-pay | Attending: Emergency Medicine | Admitting: Emergency Medicine

## 2016-03-04 ENCOUNTER — Other Ambulatory Visit: Payer: Self-pay

## 2016-03-04 ENCOUNTER — Encounter (HOSPITAL_COMMUNITY): Payer: Self-pay

## 2016-03-04 ENCOUNTER — Emergency Department (HOSPITAL_COMMUNITY): Payer: Self-pay

## 2016-03-04 DIAGNOSIS — I10 Essential (primary) hypertension: Secondary | ICD-10-CM | POA: Insufficient documentation

## 2016-03-04 DIAGNOSIS — G5632 Lesion of radial nerve, left upper limb: Secondary | ICD-10-CM | POA: Insufficient documentation

## 2016-03-04 DIAGNOSIS — F10220 Alcohol dependence with intoxication, uncomplicated: Secondary | ICD-10-CM | POA: Insufficient documentation

## 2016-03-04 DIAGNOSIS — F10920 Alcohol use, unspecified with intoxication, uncomplicated: Secondary | ICD-10-CM

## 2016-03-04 DIAGNOSIS — F17210 Nicotine dependence, cigarettes, uncomplicated: Secondary | ICD-10-CM | POA: Insufficient documentation

## 2016-03-04 DIAGNOSIS — R29898 Other symptoms and signs involving the musculoskeletal system: Secondary | ICD-10-CM

## 2016-03-04 DIAGNOSIS — Z8673 Personal history of transient ischemic attack (TIA), and cerebral infarction without residual deficits: Secondary | ICD-10-CM | POA: Insufficient documentation

## 2016-03-04 DIAGNOSIS — R42 Dizziness and giddiness: Secondary | ICD-10-CM | POA: Insufficient documentation

## 2016-03-04 LAB — APTT: aPTT: 28 seconds (ref 24–37)

## 2016-03-04 LAB — CBC
HCT: 40.5 % (ref 39.0–52.0)
Hemoglobin: 14.1 g/dL (ref 13.0–17.0)
MCH: 37.6 pg — ABNORMAL HIGH (ref 26.0–34.0)
MCHC: 34.8 g/dL (ref 30.0–36.0)
MCV: 108 fL — ABNORMAL HIGH (ref 78.0–100.0)
Platelets: 161 10*3/uL (ref 150–400)
RBC: 3.75 MIL/uL — ABNORMAL LOW (ref 4.22–5.81)
RDW: 15.3 % (ref 11.5–15.5)
WBC: 7 10*3/uL (ref 4.0–10.5)

## 2016-03-04 LAB — COMPREHENSIVE METABOLIC PANEL
ALT: 47 U/L (ref 17–63)
AST: 117 U/L — ABNORMAL HIGH (ref 15–41)
Albumin: 3.6 g/dL (ref 3.5–5.0)
Alkaline Phosphatase: 68 U/L (ref 38–126)
Anion gap: 12 (ref 5–15)
BUN: 17 mg/dL (ref 6–20)
CO2: 23 mmol/L (ref 22–32)
Calcium: 9.6 mg/dL (ref 8.9–10.3)
Chloride: 102 mmol/L (ref 101–111)
Creatinine, Ser: 1.02 mg/dL (ref 0.61–1.24)
GFR calc Af Amer: >60 mL/min (ref >60)
GFR calc non Af Amer: >60 mL/min (ref >60)
Glucose, Bld: 106 mg/dL — ABNORMAL HIGH (ref 65–99)
Potassium: 3.3 mmol/L — ABNORMAL LOW (ref 3.5–5.1)
Sodium: 137 mmol/L (ref 135–145)
Total Bilirubin: 0.6 mg/dL (ref 0.3–1.2)
Total Protein: 8.7 g/dL — ABNORMAL HIGH (ref 6.5–8.1)

## 2016-03-04 LAB — DIFFERENTIAL
Basophils Absolute: 0.1 10*3/uL (ref 0.0–0.1)
Basophils Relative: 1 %
Eosinophils Absolute: 0.1 10*3/uL (ref 0.0–0.7)
Eosinophils Relative: 2 %
Lymphocytes Relative: 53 %
Lymphs Abs: 3.8 10*3/uL (ref 0.7–4.0)
Monocytes Absolute: 1 10*3/uL (ref 0.1–1.0)
Monocytes Relative: 14 %
Neutro Abs: 2.1 10*3/uL (ref 1.7–7.7)
Neutrophils Relative %: 30 %

## 2016-03-04 LAB — ETHANOL: Alcohol, Ethyl (B): 259 mg/dL — ABNORMAL HIGH (ref <5)

## 2016-03-04 LAB — PROTIME-INR
INR: 1.08 (ref 0.00–1.49)
Prothrombin Time: 14.2 seconds (ref 11.6–15.2)

## 2016-03-04 LAB — I-STAT CHEM 8, ED
BUN: 17 mg/dL (ref 6–20)
Calcium, Ion: 1.22 mmol/L (ref 1.12–1.23)
Chloride: 102 mmol/L (ref 101–111)
Creatinine, Ser: 1.5 mg/dL — ABNORMAL HIGH (ref 0.61–1.24)
Glucose, Bld: 106 mg/dL — ABNORMAL HIGH (ref 65–99)
HCT: 42 % (ref 39.0–52.0)
Hemoglobin: 14.3 g/dL (ref 13.0–17.0)
Potassium: 3.4 mmol/L — ABNORMAL LOW (ref 3.5–5.1)
Sodium: 140 mmol/L (ref 135–145)
TCO2: 25 mmol/L (ref 0–100)

## 2016-03-04 LAB — TROPONIN I: Troponin I: 0.03 ng/mL (ref <0.031)

## 2016-03-04 LAB — CBG MONITORING, ED: Glucose-Capillary: 95 mg/dL (ref 65–99)

## 2016-03-04 LAB — I-STAT TROPONIN, ED: Troponin i, poc: 0.02 ng/mL (ref 0.00–0.08)

## 2016-03-04 LAB — I-STAT CG4 LACTIC ACID, ED: Lactic Acid, Venous: 1.75 mmol/L (ref 0.5–2.0)

## 2016-03-04 NOTE — Discharge Instructions (Signed)
Radial Nerve Palsy There is no new stroke. Follow up with the neurologist. Return to the ED if you develop new or worsening symptoms. The loss of function of the radial nerve in your arm or hand is called radial nerve palsy. The radial nerve extends from your shoulder, around the back of your upper arm, and down along the outside of your lower arm. An injury to this nerve causes certain muscles and tendons in your arm and wrist to not work properly, which leads to a condition known as wrist drop. This means that you cannot extend your wrist. If you are standing with your arm stretched straight out in front of you, your wrist will bend and your hand will drop down toward the floor. An injury to the radial nerve may also result in lost feeling (sensation) in parts of your arm. CAUSES Common causes of this condition include:  A break (fracture) of your upper arm bone (humerus) or the upper part of one of the lower arm bones (radius). This is because the radial nerve winds around both of these bones.  Complications of the surgical repair of a fractured humerus or radius.  Improper use of crutches. Crutches that are too long can put pressure on the radial nerve where it passes through the armpit (crutch palsy).  Keeping your arm in a position for a long time that places pressure on the radial nerve, such as having your arm over the back of a chair, or during sleep (Saturday night palsy).  Performing repetitive activities that involve rotation of your lower arm or movement of your wrist (repetitive use injury). RISK FACTORS This condition is more likely to develop if you have:  Diabetes.  Rheumatoid arthritis.  Hypothyroidism. SYMPTOMS  Symptoms of this condition include:  Being unable to extend your wrist.  Having difficulty straightening out your elbow in addition to your wrist.  Having numbness or tingling in the back of your arm, forearm, or hand. DIAGNOSIS This condition is diagnosed  with a thorough history of the injury or the circumstances that led up to the wrist drop. Your health care provider will examine your shoulder, arm, wrist, and hand. To confirm the diagnosis, you may also have tests, including:  Nerve conduction studies. These tests show if the radial nerve is sending electrical signals well. If not, the test can be used to figure out where the problem is within the radial nerve.  X-rays. These may be done if your health care provider suspects that you have an injury to one or more bones in your arm.  MRI. This may be used to determine the cause of your radial nerve palsy, or to make sure there is not another condition causing your symptoms. TREATMENT Treatment for this condition depends on the cause.  Removing pressure on the radial nerve. This is done if the condition is caused by pressure on the nerve. Using this treatment may allow the nerve to go back to normal within a few weeks or a few months.  Physical and occupational therapy. Your health care provider may ask you to work with a therapist to regain strength in your hand and wrist and perform stretching exercises to maintain range of motion of your wrist and elbow.  Splinting. Your therapist may make one or more splints for you to wear during the day or at night to help with motion and positioning of your wrist.  Medicines.  A steroid injection may be used to decrease swelling around the nerve.  Nonsteroidal anti-inflammatory drugs (NSAIDs) may be used to control pain.  Surgery. Depending on the cause of your radial nerve palsy, surgery may be required to:  Remove pressure on the nerve (entrapment).  Repair one or more broken bones.  Relocate (transfer) tendons in your lower arm to help you regain strength and mobility of your wrist.  Transfer of a nerve to the injury site to restore nerve function. HOME CARE INSTRUCTIONS  Follow your health care provider's instructions about how to protect  your hand and wrist.  If you have been given one or more braces to wear, use them as directed by your health care provider or therapist.  Protect your hand from extreme temperature injuries, such as burns and frostbite, as directed by your health care provider or therapist.  Exercise your hand, wrist, and arm on a regular basis as directed by your health care provider or therapist. SEEK MEDICAL CARE IF:  You have a sudden increase in pain.  You develop new numbness or new loss of sensation in your hand.  You have a sudden change in your ability to move your arm, wrist, or hand.  Your fingers become bluish in color or they feel cold to the touch.   This information is not intended to replace advice given to you by your health care provider. Make sure you discuss any questions you have with your health care provider.   Document Released: 05/13/2006 Document Revised: 01/21/2015 Document Reviewed: 11/21/2013 Elsevier Interactive Patient Education [employer]

## 2016-03-04 NOTE — ED Provider Notes (Signed)
CSN: [id number]     Arrival date & time 03/04/16  1815 History   First MD Initiated Contact with Patient 03/04/16 1911     Chief Complaint  Patient presents with  . Extremity Weakness     (Consider location/radiation/quality/duration/timing/severity/associated sxs/prior Treatment) HPI Comments: Patient presents with left upper extremity weakness. He states he was fine when he woke up at 7:30 this morning. He took a nap around 10:30 and woke up around 2:00 with weakness in his left hand. He's had a stroke previously in 2014 that was hemorrhagic that left him with left-sided weakness.this is much worse weakness in his left hand now. Denies any change in his left leg weakness. Denies any facial droop and difficulty speaking or swallowing. Denies any headache. Denies any chest pain, shortness of breath, nausea or vomiting. No abdominal pain or back pain. Denies any alcohol or drug abuse. Admits to poor compliance with his blood pressure medications. Does not take any blood thinners.  Patient is a 50 y.o. male presenting with extremity weakness. The history is provided by the patient. The history is limited by the condition of the patient.  Extremity Weakness Pertinent negatives include no chest pain, no abdominal pain, no headaches and no shortness of breath.    Past Medical History  Diagnosis Date  . Hypertension   . Pneumonia   . Hemothorax on left   . Stroke [facility]) 2007    no deficits  . Cerebral hemorrhage (HCC) 2014   History reviewed. No pertinent past surgical history. No family history on file. Social History  Substance Use Topics  . Smoking status: Current Some Day Smoker -- 1.00 packs/day    Types: Cigarettes  . Smokeless tobacco: Never Used  . Alcohol Use: Yes     Comment: once a week     Review of Systems  Constitutional: Positive for diaphoresis. Negative for fever, activity change and appetite change.  Eyes: Negative for visual disturbance.  Respiratory: Negative for  cough, chest tightness and shortness of breath.   Cardiovascular: Negative for chest pain.  Gastrointestinal: Negative for nausea, vomiting and abdominal pain.  Genitourinary: Negative for dysuria, urgency, hematuria and testicular pain.  Musculoskeletal: Positive for extremity weakness. Negative for myalgias and arthralgias.  Skin: Negative for wound.  Neurological: Positive for dizziness, weakness and numbness. Negative for facial asymmetry and headaches.  A complete 10 system review of systems was obtained and all systems are negative except as noted in the HPI and PMH.      Allergies  Benadryl  Home Medications   Prior to Admission medications   Medication Sig Start Date End Date Taking? Authorizing Provider  cloNIDine (CATAPRES) 0.1 MG tablet Take 1 tablet (0.1 mg total) by mouth 2 (two) times daily. 03/02/15  Yes Pricilla Loveless, MD  PROVENTIL HFA 108 (90 BASE) MCG/ACT inhaler INHALE 2 PUFFS BY MOUTH EVERY 4 TO 6 HOURS AS NEEDED FOR COUGHING, WHEEZING OR SHORTNESS OF BREATH. 01/21/15   Historical Provider, MD   BP 130/91 mmHg  Pulse 97  Temp(Src) 98.4 F (36.9 C) (Rectal)  Resp 18  Ht 5\' 11"  (1.803 m)  Wt 173 lb (78.472 kg)  BMI 24.14 kg/m2  SpO2 94% Physical Exam  Constitutional: He is oriented to person, place, and time. He appears well-developed and well-nourished. No distress.  Diaphoretic, denies pain, afebrile  HENT:  Head: Normocephalic and atraumatic.  Mouth/Throat: Oropharynx is clear and moist. No oropharyngeal exudate.  Eyes: Conjunctivae and EOM are normal. Pupils are equal, round, and  reactive to light.  Neck: Normal range of motion. Neck supple.  No meningismus.  Cardiovascular: Normal rate, regular rhythm, normal heart sounds and intact distal pulses.   No murmur heard. Pulmonary/Chest: Effort normal and breath sounds normal. No respiratory distress. He exhibits no tenderness.  Abdominal: Soft. There is no tenderness. There is no rebound and no guarding.   Musculoskeletal: Normal range of motion. He exhibits no edema or tenderness.  Neurological: He is alert and oriented to person, place, and time. No cranial nerve deficit. He exhibits normal muscle tone. Coordination normal.  Cranial nerves II-12 intact. 3/5 strength of left upper extremity with grip strength weakness and pronator drift. 4/5 strength of left lower extremity. 5/5 strength on right. Unable to test finger-to-nose on the left. No ataxia finger to nose on the right. Wrist drop on L.   Skin: Skin is warm.  Psychiatric: He has a normal mood and affect. His behavior is normal.  Nursing note and vitals reviewed.   ED Course  Procedures (including critical care time) Labs Review Labs Reviewed  ETHANOL - Abnormal; Notable for the following:    Alcohol, Ethyl (B) 259 (*)    All other components within normal limits  CBC - Abnormal; Notable for the following:    RBC 3.75 (*)    MCV 108.0 (*)    MCH 37.6 (*)    All other components within normal limits  COMPREHENSIVE METABOLIC PANEL - Abnormal; Notable for the following:    Potassium 3.3 (*)    Glucose, Bld 106 (*)    Total Protein 8.7 (*)    AST 117 (*)    All other components within normal limits  I-STAT CHEM 8, ED - Abnormal; Notable for the following:    Potassium 3.4 (*)    Creatinine, Ser 1.50 (*)    Glucose, Bld 106 (*)    All other components within normal limits  PROTIME-INR  APTT  DIFFERENTIAL  URINE RAPID DRUG SCREEN, HOSP PERFORMED  URINALYSIS, ROUTINE W REFLEX MICROSCOPIC (NOT AT [facility])  TROPONIN I  I-STAT TROPOININ, ED  CBG MONITORING, ED  I-STAT CG4 LACTIC ACID, ED    Imaging Review Dg Chest 2 View  03/04/2016  CLINICAL DATA:  Weakness in left hand, onset after awakening from a nap. EXAM: CHEST  2 VIEW COMPARISON:  04/22/2014 FINDINGS: New new long-term chronic prominence of the right hilum, especially the lower portion of the hilum. This was also present back in 2007. There is some mild scarring in the  right upper lobe. The right basilar airspace opacity shown on 04/22/2014 is no longer present. There is linear subsegmental atelectasis along the left lung base. No pleural effusion. Suspected emphysema. IMPRESSION: 1. Right hilar prominence is chronic from 2007 and accordingly probably benign. 2. Suspected emphysema. 3. Minimal scarring at the left lung base and right lung apex. 4. Mild interstitial accentuation, as can commonly be encountered in smokers. Electronically Signed   By: Gaylyn Rong M.D.   On: 03/04/2016 19:54   Ct Head Wo Contrast  03/04/2016  CLINICAL DATA:  Weakness in left hand. Previous intracranial hemorrhage. EXAM: CT HEAD WITHOUT CONTRAST TECHNIQUE: Contiguous axial images were obtained from the base of the skull through the vertex without intravenous contrast. COMPARISON:  03/02/2015 FINDINGS: Again noted is encephalomalacia in the right frontal lobe and right insular cortex region. Changes related to prior hemorrhage in this area. There is mild enlargement of the right lateral ventricle anterior horn which is stable. No evidence for acute  hemorrhage, mass lesion, significant midline shift or hydrocephalus. No evidence to suggest a new large infarct. The visualized paranasal sinuses are clear. No acute bone abnormality. IMPRESSION: No acute intracranial abnormality. Stable encephalomalacia in the right frontal lobe and right insular cortex region. Electronically Signed   By: Richarda Overlie M.D.   On: 03/04/2016 20:06   I have personally reviewed and evaluated these images and lab results as part of my medical decision-making.   EKG Interpretation None      MDM   Final diagnoses:  LUE weakness  Radial nerve palsy, left  Alcohol intoxication, uncomplicated (HCC)   Patient presents with worsening left upper extremity weakness, last seen normal was 10:30 AM. Code stroke not activated as patient is more than 8 hours out of window. Also has previous hemorrhagic infarct is not  candidate for IV TPA.  CT of head negative for hemorrhage or acute pathology. Patient significantly intoxicated.  Seen by neurologist who feels likely radial nerve palsy. Doubts new infarct, but does recommend MRI  D/w Neurologist Dr. Elta Guadeloupe.He evaluated the patient and feels he has a left wrist drop which is superimposed on his chronic left hemiparesis. Does recommend MRI as above. Feels he can be discharged if MRI is negative  MRI shows no new infarct.  Patient denies pain.  He is tolerating PO and ambulatory. He was initially diaphoretic which he states he "always is". EKG unchanged. CBG normal. Afebrile. Troponin negative.  Will obtain second troponin.  Will allow patient to sober a bit. Dr. Clarene Duke to disposition after second troponin.  Follow up with neurology for suspected radial nerve palsy. Wrist splint given.  ED ECG REPORT   Date: 03/04/2016  Rate: 96  Rhythm: normal sinus rhythm  QRS Axis: left  Intervals: normal  ST/T Wave abnormalities: nonspecific ST/T changes  Conduction Disutrbances:none  Narrative Interpretation: LV[facility]  Old EKG Reviewed: unchanged  I have personally reviewed the EKG tracing and agree with the computerized printout as noted.   Glynn Octave, MD 03/04/16 [phone]

## 2016-03-04 NOTE — ED Notes (Signed)
Teleneuro complete. 

## 2016-03-04 NOTE — ED Notes (Signed)
Pt was wandering around in halls asking for discharge instructions now, so pt was discharged at desk due to him not going back to his room.

## 2016-03-04 NOTE — ED Provider Notes (Signed)
Pt received at sign out with 2nd troponin pending. Result is negative. Pt has been ambulatory with steady gait, tol PO well. Pt is currently standing at the [facility] requesting discharge. Dx and testing d/w pt.  Questions answered.  Verb understanding, agreeable to d/c home with outpt f/u.   Samuel Jester, DO 03/04/16 2313

## 2016-03-04 NOTE — ED Notes (Signed)
Pt reports got up at 7:30 this morning and felt fine.  Pt reports he worked on some cars then took a nap around 1030 this morning.  Pt says when he woke up around 2 pm, he had weakness in left hand.  Pt says he can't use his left hand, unable to make a tight fist or grip anything.

## 2016-03-04 NOTE — ED Notes (Signed)
SOC on called consulted. CT to be faxed.

## 2016-03-04 NOTE — ED Notes (Signed)
Patient ambulated, dressed himself and tolerated PO with no diffuculties

## 2017-08-16 ENCOUNTER — Emergency Department (HOSPITAL_COMMUNITY)
Admission: EM | Admit: 2017-08-16 | Discharge: 2017-08-17 | Disposition: A | Discharge status: Home / Self Care | Payer: Self-pay | Attending: Emergency Medicine | Admitting: Emergency Medicine

## 2017-08-16 ENCOUNTER — Encounter (HOSPITAL_COMMUNITY): Payer: Self-pay

## 2017-08-16 ENCOUNTER — Emergency Department (HOSPITAL_COMMUNITY): Payer: Self-pay

## 2017-08-16 DIAGNOSIS — Z8673 Personal history of transient ischemic attack (TIA), and cerebral infarction without residual deficits: Secondary | ICD-10-CM | POA: Insufficient documentation

## 2017-08-16 DIAGNOSIS — Z79899 Other long term (current) drug therapy: Secondary | ICD-10-CM | POA: Insufficient documentation

## 2017-08-16 DIAGNOSIS — F17210 Nicotine dependence, cigarettes, uncomplicated: Secondary | ICD-10-CM | POA: Insufficient documentation

## 2017-08-16 DIAGNOSIS — F10229 Alcohol dependence with intoxication, unspecified: Secondary | ICD-10-CM | POA: Insufficient documentation

## 2017-08-16 DIAGNOSIS — I10 Essential (primary) hypertension: Secondary | ICD-10-CM | POA: Insufficient documentation

## 2017-08-16 DIAGNOSIS — F10929 Alcohol use, unspecified with intoxication, unspecified: Secondary | ICD-10-CM

## 2017-08-16 LAB — COMPREHENSIVE METABOLIC PANEL
ALT: 15 U/L — ABNORMAL LOW (ref 17–63)
AST: 29 U/L (ref 15–41)
Albumin: 3.6 g/dL (ref 3.5–5.0)
Alkaline Phosphatase: 73 U/L (ref 38–126)
Anion gap: 11 (ref 5–15)
BUN: 10 mg/dL (ref 6–20)
CO2: 22 mmol/L (ref 22–32)
Calcium: 8.6 mg/dL — ABNORMAL LOW (ref 8.9–10.3)
Chloride: 105 mmol/L (ref 101–111)
Creatinine, Ser: 0.78 mg/dL (ref 0.61–1.24)
GFR calc Af Amer: >60 mL/min (ref >60)
GFR calc non Af Amer: >60 mL/min (ref >60)
Glucose, Bld: 115 mg/dL — ABNORMAL HIGH (ref 65–99)
Potassium: 3.1 mmol/L — ABNORMAL LOW (ref 3.5–5.1)
Sodium: 138 mmol/L (ref 135–145)
Total Bilirubin: 0.4 mg/dL (ref 0.3–1.2)
Total Protein: 8.8 g/dL — ABNORMAL HIGH (ref 6.5–8.1)

## 2017-08-16 LAB — ETHANOL: Alcohol, Ethyl (B): 488 mg/dL (ref <10)

## 2017-08-16 LAB — CBC WITH DIFFERENTIAL/PLATELET
Basophils Absolute: 0.1 10*3/uL (ref 0.0–0.1)
Basophils Relative: 1 %
Eosinophils Absolute: 0.2 10*3/uL (ref 0.0–0.7)
Eosinophils Relative: 2 %
HCT: 44.9 % (ref 39.0–52.0)
Hemoglobin: 15.1 g/dL (ref 13.0–17.0)
Lymphocytes Relative: 60 %
Lymphs Abs: 7.8 10*3/uL — ABNORMAL HIGH (ref 0.7–4.0)
MCH: 37.6 pg — ABNORMAL HIGH (ref 26.0–34.0)
MCHC: 33.6 g/dL (ref 30.0–36.0)
MCV: 111.7 fL — ABNORMAL HIGH (ref 78.0–100.0)
Monocytes Absolute: 0.9 10*3/uL (ref 0.1–1.0)
Monocytes Relative: 7 %
Neutro Abs: 4.1 10*3/uL (ref 1.7–7.7)
Neutrophils Relative %: 32 %
Platelets: 227 10*3/uL (ref 150–400)
RBC: 4.02 MIL/uL — ABNORMAL LOW (ref 4.22–5.81)
RDW: 13.5 % (ref 11.5–15.5)
WBC: 13.1 10*3/uL — ABNORMAL HIGH (ref 4.0–10.5)

## 2017-08-16 LAB — CBG MONITORING, ED: Glucose-Capillary: 86 mg/dL (ref 65–99)

## 2017-08-16 MED ORDER — SODIUM CHLORIDE 0.9 % IV SOLN
INTRAVENOUS | Status: DC
  Administered 2017-08-16: 18:00:00 via INTRAVENOUS

## 2017-08-16 MED ORDER — SODIUM CHLORIDE 0.9 % IV BOLUS (SEPSIS)
1000.0000 mL | Freq: Once | INTRAVENOUS | Status: AC
  Administered 2017-08-16: 1000 mL via INTRAVENOUS

## 2017-08-16 NOTE — ED Notes (Signed)
edp aware.  

## 2017-08-16 NOTE — ED Triage Notes (Signed)
Per [employer], pt was found unconscious in the bathroom of a convenient store with an empty bottle of vodka beside him.  Pt arrived with [employer].  [employer] tried to take him home but pt gave them the wrong address.

## 2017-08-16 NOTE — ED Notes (Signed)
Pt incontinent of urine. Pt cleaned & dried. Bed linens changed.

## 2017-08-16 NOTE — ED Notes (Signed)
Pt o2 at 86% on room air. Pt placed on 2 liters O2 per RN Tiffany.

## 2017-08-16 NOTE — ED Notes (Signed)
Pt resting w/ eyes closed, snoring respirations. NAD noted.

## 2017-08-16 NOTE — ED Provider Notes (Signed)
[facility] EMERGENCY DEPARTMENT Provider Note   CSN: [id number] Arrival date & time: 08/16/17  1620    Level 5 caveat: Altered mental status History   Chief Complaint Chief Complaint  Patient presents with  . Alcohol Intoxication    HPI Rick Williams is a 51 y.o. male.  HPI Patient presents to the emergency room for evaluation of altered mental status.  Patient was brought in by police.  He was found in the bathroom of the convenient store with an empty bottle of vodka beside him.  When the police department tried to take him home the patient gave the wrong address.  Patient is minimally arousable.  He will mumble some answers but they are unintelligible. Past Medical History:  Diagnosis Date  . Cerebral hemorrhage (HCC) 2014  . Hemothorax on left   . Hypertension   . Pneumonia   . Stroke [facility]) 2007   no deficits    Patient Active Problem List   Diagnosis Date Noted  . Left-sided weakness 02/26/2013  . Leg weakness 02/26/2013  . Pain in joint, lower leg 02/26/2013  . Muscle weakness (generalized) 02/26/2013  . Lack of coordination 02/26/2013  . ICH (intracerebral hemorrhage) (HCC) 02/07/2013  . Essential hypertension, malignant 02/06/2013  . Encounter for long-term (current) use of high-risk medication 02/06/2013  . Hemorrhagic stroke (HCC) 01/30/2013  . Acute respiratory failure due to AMS 01/30/2013  . Hypertension 01/30/2013  . IVH (intraventricular hemorrhage) (HCC) 01/30/2013  . Intracranial hypertension 01/30/2013    History reviewed. No pertinent surgical history.     Home Medications    Prior to Admission medications   Medication Sig Start Date End Date Taking? Authorizing Provider  cloNIDine (CATAPRES) 0.1 MG tablet Take 1 tablet (0.1 mg total) by mouth 2 (two) times daily. 03/02/15   Pricilla Loveless, MD  PROVENTIL HFA 108 (90 BASE) MCG/ACT inhaler INHALE 2 PUFFS BY MOUTH EVERY 4 TO 6 HOURS AS NEEDED FOR COUGHING, WHEEZING OR SHORTNESS OF  BREATH. 01/21/15   [provider]    Family History No family history on file.  Social History Social History   Tobacco Use  . Smoking status: Current Some Day Smoker    Packs/day: 1.00    Types: Cigarettes  . Smokeless tobacco: Never Used  Substance Use Topics  . Alcohol use: Yes    Comment: once a week   . Drug use: No    Comment: last use 2 months ago     Allergies   Benadryl [diphenhydramine hcl (sleep)]   Review of Systems Review of Systems  All other systems reviewed and are negative.    Physical Exam Updated Vital Signs BP (!) 181/119   Pulse 74   Resp 18   SpO2 97%   Physical Exam  Constitutional: No distress.  HENT:  Head: Normocephalic and atraumatic.  Right Ear: External ear normal.  Left Ear: External ear normal.  Eyes: Conjunctivae are normal. Right eye exhibits no discharge. Left eye exhibits no discharge. No scleral icterus.  Neck: Neck supple. No tracheal deviation present.  Cardiovascular: Normal rate, regular rhythm and intact distal pulses.  Pulmonary/Chest: Effort normal and breath sounds normal. No stridor. No respiratory distress. He has no wheezes. He has no rales.  Abdominal: Soft. Bowel sounds are normal. He exhibits no distension. There is no tenderness. There is no rebound and no guarding.  Musculoskeletal: He exhibits no edema or tenderness.       Right shoulder: He exhibits no tenderness, no bony tenderness  and no swelling.       Left shoulder: He exhibits no tenderness, no bony tenderness and no swelling.       Right wrist: He exhibits no tenderness, no bony tenderness and no swelling.       Left wrist: He exhibits no tenderness, no bony tenderness and no swelling.       Right hip: He exhibits normal range of motion, no tenderness, no bony tenderness and no swelling.       Left hip: He exhibits normal range of motion, no tenderness and no bony tenderness.       Right ankle: He exhibits no swelling. No tenderness.        Left ankle: He exhibits no swelling. No tenderness.       Cervical back: He exhibits no tenderness, no bony tenderness and no swelling.       Thoracic back: He exhibits no tenderness, no bony tenderness and no swelling.       Lumbar back: He exhibits no tenderness, no bony tenderness and no swelling.  Neurological: He is alert. No cranial nerve deficit (no facial droop, extraocular movements intact, no slurred speech) or sensory deficit. He exhibits normal muscle tone. He displays no seizure activity. GCS eye subscore is 2. GCS verbal subscore is 4. GCS motor subscore is 6.  Patient will grip my fingers bilaterally when I asked him to, he does not move his feet, patient mumbles some answers although it is very difficult to understand.  Skin: Skin is warm and dry. No rash noted. He is not diaphoretic.  Psychiatric: He has a normal mood and affect.  Nursing note and vitals reviewed.    ED Treatments / Results  Labs (all labs ordered are listed, but only abnormal results are displayed) Labs Reviewed  COMPREHENSIVE METABOLIC PANEL - Abnormal; Notable for the following components:      Result Value   Potassium 3.1 (*)    Glucose, Bld 115 (*)    Calcium 8.6 (*)    Total Protein 8.8 (*)    ALT 15 (*)    All other components within normal limits  CBC WITH DIFFERENTIAL/PLATELET - Abnormal; Notable for the following components:   WBC 13.1 (*)    RBC 4.02 (*)    MCV 111.7 (*)    MCH 37.6 (*)    Lymphs Abs 7.8 (*)    All other components within normal limits  ETHANOL - Abnormal; Notable for the following components:   Alcohol, Ethyl (B) 488 (*)    All other components within normal limits  PATHOLOGIST SMEAR REVIEW  CBG MONITORING, ED    EKG  EKG Interpretation  Date/Time:  Tuesday August 16 2017 17:22:32 EST Ventricular Rate:  80 PR Interval:    QRS Duration: 82 QT Interval:  443 QTC Calculation: 512 R Axis:   96 Text Interpretation:  Sinus rhythm Borderline right axis deviation  Probable left ventricular hypertrophy Nonspecific T abnormalities, lateral leads Prolonged QT interval Artifact in lead(s) I III aVR aVL V1 V2 Poor data quality otherwise no sig change Confirmed by Linwood Dibbles [phone]) on 08/16/2017 5:50:31 PM       Radiology Ct Head Wo Contrast  Result Date: 08/16/2017 CLINICAL DATA:  51 year old male with altered mental status. EXAM: CT HEAD WITHOUT CONTRAST TECHNIQUE: Contiguous axial images were obtained from the base of the skull through the vertex without intravenous contrast. COMPARISON:  03/04/2016 CT and MRI, and prior studies. FINDINGS: Brain: No evidence of acute infarction, hemorrhage,  hydrocephalus, extra-axial collection or mass lesion/mass effect. Mild atrophy and chronic small-vessel white matter ischemic changes again noted. Vascular: No hyperdense vessel or unexpected calcification. Skull: Normal. Negative for fracture or focal lesion. Sinuses/Orbits: No acute finding. Other: None. IMPRESSION: 1. No evidence of acute intracranial abnormality. 2. Mild atrophy and chronic small-vessel white matter ischemic changes. Electronically Signed   By: Harmon PierJeffrey  Hu M.D.   On: 08/16/2017 18:41   Dg Chest Port 1 View  Result Date: 08/16/2017 CLINICAL DATA:  Altered level of consciousness. EXAM: PORTABLE CHEST 1 VIEW COMPARISON:  03/04/2016 FINDINGS: Borderline cardiomegaly accentuated by technique. Interstitial edema/ vascular congestion evidences diffuse interstitial prominence and indistinctness. Mild scarring in the right upper lobe. No asymmetric airspace opacity, visible fluid, or pneumothorax. IMPRESSION: Edema/vascular congestion accentuated by low volumes. Electronically Signed   By: Marnee SpringJonathon  Watts M.D.   On: 08/16/2017 17:59    Procedures Procedures (including critical care time)  Medications Ordered in ED Medications  sodium chloride 0.9 % bolus 1,000 mL (0 mLs Intravenous Stopped 08/16/17 1819)    And  0.9 %  sodium chloride infusion (  Intravenous New Bag/Given 08/16/17 1822)     Initial Impression / Assessment and Plan / ED Course  I have reviewed the triage vital signs and the nursing notes.  Pertinent labs & imaging results that were available during my care of the patient were reviewed by me and considered in my medical decision making (see chart for details).  Clinical Course as of Aug 17 2155  Tue Aug 16, 2017  1947 Patient's alcohol level is extremely elevated at 408  [JK]  1947 We will continue to monitor until he sobers up  [JK]    Clinical Course User Index [JK] Linwood DibblesKnapp, Tris Howell, MD    Patient presented to the emergency room with altered mental status, presumed to be alcohol intoxication.  Patient's laboratory tests confirm his extreme level of alcohol intoxication.  Mild hypokalemia and elevated white blood cell count noted.  I do not think these are clinically significant.  Patient will be monitored in the emergency room until he is sober enough to be safely discharged.  Care turned over to oncoming MD  Final Clinical Impressions(s) / ED Diagnoses   Final diagnoses:  Alcoholic intoxication with complication [facility](HCC)      Linwood DibblesKnapp, Rami Waddle, MD 08/16/17 2158

## 2017-08-16 NOTE — ED Notes (Signed)
Pt is arousable by touch. Is snoring at this time.

## 2017-08-17 LAB — PATHOLOGIST SMEAR REVIEW

## 2017-08-17 NOTE — ED Provider Notes (Signed)
Patient signed out to me to observe as she metabolizes ethanol.  He presented quite intoxicated.  He was sleeping through most of the night.  He is noted to be ambulatory and steady on his feet and is felt to be safe for discharge.  He is given outpatient resources.   Dione Booze, MD 08/17/17 [phone]

## 2018-03-19 NOTE — Congregational Nurse Program (Signed)
Congregational Nurse Program Note  Date of Encounter: 03/19/2018  Past Medical History: Past Medical History:  Diagnosis Date  . Cerebral hemorrhage (HCC) 2014  . Hemothorax on left   . Hypertension   . Pneumonia   . Stroke [facility]) 2007   no deficits    Encounter Details: CNP Questionnaire - 03/01/18 1115      Questionnaire   Patient Status  Not Applicable    Race  Black or African American    Location Patient Served At  [employer], [employer]  Not Applicable    Uninsured  Uninsured (NEW 1x/quarter)    Food  No food insecurities    Housing/Utilities  Yes, have permanent housing    Transportation  No transportation needs    Interpersonal Safety  Yes, feel physically and emotionally safe where you currently live    Medication  No medication insecurities    Medical Provider  No    Referrals  [employer], trade [employer]    ED Visit Averted  Not Applicable    Life-Saving Intervention Made  Not Applicable     B/P 209/128- P 61 TCT [facility] Depa[facility] not on record at either place.  Encouraged client to go and be seen at [facility]  ER, but client refused. Stated he had to go to work. Explained  Dangers of having a pressure That high but client insisted on getting to work Stated he will have his B/P checked again later on today Jenene Slicker RN, [place] [facility] [phone]

## 2018-09-24 ENCOUNTER — Encounter (HOSPITAL_COMMUNITY): Payer: Self-pay | Admitting: Emergency Medicine

## 2018-09-24 ENCOUNTER — Other Ambulatory Visit: Payer: Self-pay

## 2018-09-24 ENCOUNTER — Emergency Department (HOSPITAL_COMMUNITY)
Admission: EM | Admit: 2018-09-24 | Discharge: 2018-09-24 | Disposition: A | Discharge status: Home / Self Care | Payer: Self-pay | Attending: Emergency Medicine | Admitting: Emergency Medicine

## 2018-09-24 ENCOUNTER — Emergency Department (HOSPITAL_COMMUNITY): Payer: Self-pay

## 2018-09-24 DIAGNOSIS — Z79899 Other long term (current) drug therapy: Secondary | ICD-10-CM | POA: Insufficient documentation

## 2018-09-24 DIAGNOSIS — F1010 Alcohol abuse, uncomplicated: Secondary | ICD-10-CM | POA: Insufficient documentation

## 2018-09-24 DIAGNOSIS — F17210 Nicotine dependence, cigarettes, uncomplicated: Secondary | ICD-10-CM | POA: Insufficient documentation

## 2018-09-24 DIAGNOSIS — I10 Essential (primary) hypertension: Secondary | ICD-10-CM | POA: Insufficient documentation

## 2018-09-24 DIAGNOSIS — F1410 Cocaine abuse, uncomplicated: Secondary | ICD-10-CM | POA: Insufficient documentation

## 2018-09-24 HISTORY — DX: Alcohol abuse, uncomplicated: F10.10

## 2018-09-24 LAB — CBC
HCT: 40.8 % (ref 39.0–52.0)
Hemoglobin: 13.9 g/dL (ref 13.0–17.0)
MCH: 36.1 pg — ABNORMAL HIGH (ref 26.0–34.0)
MCHC: 34.1 g/dL (ref 30.0–36.0)
MCV: 106 fL — ABNORMAL HIGH (ref 80.0–100.0)
Platelets: 167 10*3/uL (ref 150–400)
RBC: 3.85 MIL/uL — ABNORMAL LOW (ref 4.22–5.81)
RDW: 14.2 % (ref 11.5–15.5)
WBC: 10 10*3/uL (ref 4.0–10.5)
nRBC: 0.2 % (ref 0.0–0.2)

## 2018-09-24 LAB — COMPREHENSIVE METABOLIC PANEL
ALT: 31 U/L (ref 0–44)
AST: 65 U/L — ABNORMAL HIGH (ref 15–41)
Albumin: 3.6 g/dL (ref 3.5–5.0)
Alkaline Phosphatase: 72 U/L (ref 38–126)
Anion gap: 8 (ref 5–15)
BUN: 13 mg/dL (ref 6–20)
CO2: 24 mmol/L (ref 22–32)
Calcium: 8.5 mg/dL — ABNORMAL LOW (ref 8.9–10.3)
Chloride: 105 mmol/L (ref 98–111)
Creatinine, Ser: 0.77 mg/dL (ref 0.61–1.24)
GFR calc Af Amer: >60 mL/min (ref >60)
GFR calc non Af Amer: >60 mL/min (ref >60)
Glucose, Bld: 90 mg/dL (ref 70–99)
Potassium: 3.4 mmol/L — ABNORMAL LOW (ref 3.5–5.1)
Sodium: 137 mmol/L (ref 135–145)
Total Bilirubin: 0.7 mg/dL (ref 0.3–1.2)
Total Protein: 8.4 g/dL — ABNORMAL HIGH (ref 6.5–8.1)

## 2018-09-24 LAB — RAPID URINE DRUG SCREEN, HOSP PERFORMED
Amphetamines: NOT DETECTED
Barbiturates: NOT DETECTED
Benzodiazepines: NOT DETECTED
Cocaine: POSITIVE — AB
Opiates: NOT DETECTED
Tetrahydrocannabinol: POSITIVE — AB

## 2018-09-24 LAB — ETHANOL: Alcohol, Ethyl (B): 213 mg/dL — ABNORMAL HIGH (ref <10)

## 2018-09-24 MED ORDER — VITAMIN B-1 100 MG PO TABS
100.0000 mg | ORAL_TABLET | Freq: Once | ORAL | Status: AC
  Administered 2018-09-24: 100 mg via ORAL
  Filled 2018-09-24: qty 1

## 2018-09-24 MED ORDER — CHLORDIAZEPOXIDE HCL 25 MG PO CAPS: ORAL_CAPSULE | ORAL | 0 refills | Status: DC

## 2018-09-24 NOTE — ED Provider Notes (Signed)
[facility] EMERGENCY DEPARTMENT Provider Note   CSN: [id number] Arrival date & time: 09/24/18  1304     History   Chief Complaint Chief Complaint  Patient presents with  . Medical Clearance    HPI Rick Williams is a 53 y.o. male.  HPI Patient presents requesting detox of alcohol.  States he drinks wine and drinks up to 4-5 fifths of it a day.  Drank last night.  States has been drinking heavily for years.  Over the last year to the long sobriety is had is around 5 months and states that was forced upon him because he was in jail.  Denies other substance abuse.  Occasional cough.  No chest pain.  No fevers or chills.  States he is feeling a little withdrawal now but not too bad.  Never had seizures with withdrawal. Past Medical History:  Diagnosis Date  . Alcohol abuse   . Cerebral hemorrhage (HCC) 2014  . Hemothorax on left   . Hypertension   . Pneumonia   . Stroke [facility]) 2007   no deficits    Patient Active Problem List   Diagnosis Date Noted  . Left-sided weakness 02/26/2013  . Leg weakness 02/26/2013  . Pain in joint, lower leg 02/26/2013  . Muscle weakness (generalized) 02/26/2013  . Lack of coordination 02/26/2013  . ICH (intracerebral hemorrhage) (HCC) 02/07/2013  . Essential hypertension, malignant 02/06/2013  . Encounter for long-term (current) use of high-risk medication 02/06/2013  . Hemorrhagic stroke (HCC) 01/30/2013  . Acute respiratory failure due to AMS 01/30/2013  . Hypertension 01/30/2013  . IVH (intraventricular hemorrhage) (HCC) 01/30/2013  . Intracranial hypertension 01/30/2013    History reviewed. No pertinent surgical history.      Home Medications    Prior to Admission medications   Medication Sig Start Date End Date Taking? Authorizing Provider  cloNIDine (CATAPRES) 0.1 MG tablet Take 1 tablet (0.1 mg total) by mouth 2 (two) times daily. Patient not taking: Reported on 09/24/2018 03/02/15   Pricilla Loveless, MD  PROVENTIL HFA 108 (90  BASE) MCG/ACT inhaler INHALE 2 PUFFS BY MOUTH EVERY 4 TO 6 HOURS AS NEEDED FOR COUGHING, WHEEZING OR SHORTNESS OF BREATH. 01/21/15   [provider]    Family History History reviewed. No pertinent family history.  Social History Social History   Tobacco Use  . Smoking status: Current Some Day Smoker    Packs/day: 1.00    Types: Cigarettes  . Smokeless tobacco: Never Used  Substance Use Topics  . Alcohol use: Yes    Comment: once a week   . Drug use: No    Types: Marijuana    Comment: last use 2 months ago     Allergies   Benadryl [diphenhydramine hcl (sleep)]   Review of Systems Review of Systems  Constitutional: Negative for appetite change.  Respiratory: Positive for cough. Negative for shortness of breath.   Cardiovascular: Negative for chest pain.  Gastrointestinal: Negative for abdominal pain.  Genitourinary: Negative for flank pain.  Musculoskeletal: Negative for back pain.  Skin: Negative for rash.  Neurological: Positive for tremors.  Psychiatric/Behavioral: Negative for dysphoric mood and suicidal ideas.     Physical Exam Updated Vital Signs BP (!) 138/101 (BP Location: Right Arm)   Pulse (!) 111   Temp 98.1 F (36.7 C) (Oral)   Resp 16   Ht 5\' 10"  (1.778 m)   Wt 77.1 kg   SpO2 96%   BMI 24.39 kg/m   Physical Exam HENT:  Head: Normocephalic.     Mouth/Throat:     Mouth: Mucous membranes are moist.  Eyes:     Extraocular Movements: Extraocular movements intact.  Neck:     Musculoskeletal: Normal range of motion.  Cardiovascular:     Comments: Mild tachycardia Pulmonary:     Breath sounds: No wheezing or rhonchi.  Abdominal:     Tenderness: There is no abdominal tenderness.  Musculoskeletal:        General: No tenderness.  Skin:    General: Skin is warm.     Capillary Refill: Capillary refill takes less than 2 seconds.  Neurological:     Mental Status: He is alert and oriented to person, place, and time.  Psychiatric:         Mood and Affect: Mood normal.      ED Treatments / Results  Labs (all labs ordered are listed, but only abnormal results are displayed) Labs Reviewed  RAPID URINE DRUG SCREEN, HOSP PERFORMED - Abnormal; Notable for the following components:      Result Value   Cocaine POSITIVE (*)    Tetrahydrocannabinol POSITIVE (*)    All other components within normal limits  COMPREHENSIVE METABOLIC PANEL  ETHANOL  CBC    EKG None  Radiology No results found.  Procedures Procedures (including critical care time)  Medications Ordered in ED Medications  thiamine (VITAMIN B-1) tablet 100 mg (has no administration in time range)     Initial Impression / Assessment and Plan / ED Course  I have reviewed the triage vital signs and the nursing notes.  Pertinent labs & imaging results that were available during my care of the patient were reviewed by me and considered in my medical decision making (see chart for details).     Patient requesting detox off of alcohol.  Denies other drug use but cocaine and marijuana showed up in the urine.  Lab work pending.  Mild tachycardia.  Thiamine given.  Care will be turned over to oncoming physician but patient likely be able to discharged with Librium for outpatient management.  Final Clinical Impressions(s) / ED Diagnoses   Final diagnoses:  Alcohol abuse  Cocaine abuse [facility])    ED Discharge Orders    None       Benjiman Core, MD 09/24/18 1452

## 2018-09-24 NOTE — ED Provider Notes (Signed)
Patient is hemodynamically stable.  Will Rx Librium 25 mg as an outpatient for alcohol withdrawal.   Donnetta Hutching, MD 09/24/18 1800

## 2018-09-24 NOTE — ED Notes (Signed)
Pt continues to sleep.  Even rise and fall of chest noted.

## 2018-09-24 NOTE — Discharge Instructions (Addendum)
Medication to help you come off from alcohol.  Do not drink while taking the medicine.  Recommend AA meetings.

## 2018-09-24 NOTE — ED Triage Notes (Signed)
Pt wants help to stop drinking alcohol. Pt reports dranks 2-3 fifths of liquor a day. Last drink was last night.denies homicidal or suicidal thoughts

## 2018-09-24 NOTE — ED Notes (Signed)
Pt is sleeping soundly at this time.  

## 2018-09-28 ENCOUNTER — Other Ambulatory Visit: Payer: Self-pay

## 2018-09-28 ENCOUNTER — Emergency Department (HOSPITAL_COMMUNITY)
Admission: EM | Admit: 2018-09-28 | Discharge: 2018-09-28 | Disposition: A | Discharge status: Home / Self Care | Payer: Self-pay | Attending: Emergency Medicine | Admitting: Emergency Medicine

## 2018-09-28 ENCOUNTER — Encounter (HOSPITAL_COMMUNITY): Payer: Self-pay

## 2018-09-28 DIAGNOSIS — F10220 Alcohol dependence with intoxication, uncomplicated: Secondary | ICD-10-CM | POA: Insufficient documentation

## 2018-09-28 DIAGNOSIS — F17210 Nicotine dependence, cigarettes, uncomplicated: Secondary | ICD-10-CM | POA: Insufficient documentation

## 2018-09-28 DIAGNOSIS — Z8673 Personal history of transient ischemic attack (TIA), and cerebral infarction without residual deficits: Secondary | ICD-10-CM | POA: Insufficient documentation

## 2018-09-28 DIAGNOSIS — I10 Essential (primary) hypertension: Secondary | ICD-10-CM | POA: Insufficient documentation

## 2018-09-28 DIAGNOSIS — Z7289 Other problems related to lifestyle: Secondary | ICD-10-CM

## 2018-09-28 DIAGNOSIS — F1090 Alcohol use, unspecified, uncomplicated: Secondary | ICD-10-CM

## 2018-09-28 MED ORDER — CHLORDIAZEPOXIDE HCL 25 MG PO CAPS
50.0000 mg | ORAL_CAPSULE | Freq: Once | ORAL | Status: AC
  Administered 2018-09-28: 50 mg via ORAL
  Filled 2018-09-28: qty 2

## 2018-09-28 MED ORDER — CHLORDIAZEPOXIDE HCL 25 MG PO CAPS: ORAL_CAPSULE | ORAL | 0 refills | Status: DC

## 2018-09-28 NOTE — ED Triage Notes (Signed)
Pt endorses wanting detox from alcohol, went to [facility] Sunday and given prescription for librium but could not afford it. Last drink 2 days ago. Pt appears slightly anxious and shaky in triage.

## 2018-09-30 NOTE — ED Provider Notes (Signed)
Emergency Department Provider Note   I have reviewed the triage vital signs and the nursing notes.   HISTORY  Chief Complaint Needs detox   HPI Rick Williams is a 53 y.o. male who presents the emergency department today for detox from alcohol and cocaine.  Patient states that he has been doing these for multiple years he is ready to quit.  Has been seen and got a Librium prescription previously but could not afford it.  Is wanting help with some type of resources to help with his symptoms.  Some tremors but denies any hallucinations or seizures.  Slightly anxious otherwise no other concerns. No other associated or modifying symptoms.    Past Medical History:  Diagnosis Date  . Alcohol abuse   . Cerebral hemorrhage (HCC) 2014  . Hemothorax on left   . Hypertension   . Pneumonia   . Stroke [facility]) 2007   no deficits    Patient Active Problem List   Diagnosis Date Noted  . Left-sided weakness 02/26/2013  . Leg weakness 02/26/2013  . Pain in joint, lower leg 02/26/2013  . Muscle weakness (generalized) 02/26/2013  . Lack of coordination 02/26/2013  . ICH (intracerebral hemorrhage) (HCC) 02/07/2013  . Essential hypertension, malignant 02/06/2013  . Encounter for long-term (current) use of high-risk medication 02/06/2013  . Hemorrhagic stroke (HCC) 01/30/2013  . Acute respiratory failure due to AMS 01/30/2013  . Hypertension 01/30/2013  . IVH (intraventricular hemorrhage) (HCC) 01/30/2013  . Intracranial hypertension 01/30/2013    History reviewed. No pertinent surgical history.  Current Outpatient Rx  . Order #: [id number] Class: Print    Allergies Benadryl [diphenhydramine hcl (sleep)]  History reviewed. No pertinent family history.  Social History Social History   Tobacco Use  . Smoking status: Current Some Day Smoker    Packs/day: 1.00    Types: Cigarettes  . Smokeless tobacco: Never Used  Substance Use Topics  . Alcohol use: Yes    Comment: large  amount every day  . Drug use: No    Types: Marijuana    Comment: last use 2 months ago    Review of Systems  All other systems negative except as documented in the HPI. All pertinent positives and negatives as reviewed in the HPI. ____________________________________________   PHYSICAL EXAM:  VITAL SIGNS: ED Triage Vitals  Enc Vitals Group     BP 09/28/18 1142 (!) 153/102     Pulse Rate 09/28/18 1142 90     Resp 09/28/18 1142 20     Temp 09/28/18 1142 98.4 F (36.9 C)     Temp Source 09/28/18 1142 Oral     SpO2 09/28/18 1142 99 %    Constitutional: Alert and oriented. Well appearing and in no acute distress. Eyes: Conjunctivae are normal. PERRL. EOMI. Head: Atraumatic. Nose: No congestion/rhinnorhea. Mouth/Throat: Mucous membranes are moist.  Oropharynx non-erythematous. Neck: No stridor.  No meningeal signs.   Cardiovascular: Normal rate, regular rhythm. Good peripheral circulation. Grossly normal heart sounds.   Respiratory: Normal respiratory effort.  No retractions. Lungs CTAB. Gastrointestinal: Soft and nontender. No distention.  Musculoskeletal: No lower extremity tenderness nor edema. No gross deformities of extremities. Neurologic:  Normal speech and language. No gross focal neurologic deficits are appreciated.  Skin:  Skin is warm, dry and intact. No rash noted.   ____________________________________________     INITIAL IMPRESSION / ASSESSMENT AND PLAN / ED COURSE  Appears to have uncomplicated alcohol withdrawal at this time.  No concerning findings on history or  examination to make me worry about DTs or the need for inpatient hospitalization for detox from this alcohol.  Resources and Librium provided.  Stable for discharge with his uncle at this time.     Pertinent labs & imaging results that were available during my care of the patient were reviewed by me and considered in my medical decision making (see chart for  details).  ____________________________________________  FINAL CLINICAL IMPRESSION(S) / ED DIAGNOSES  Final diagnoses:  Alcohol drinking problem     MEDICATIONS GIVEN DURING THIS VISIT:  Medications  chlordiazePOXIDE (LIBRIUM) capsule 50 mg (50 mg Oral Given 09/28/18 1333)     NEW OUTPATIENT MEDICATIONS STARTED DURING THIS VISIT:  Discharge Medication List as of 09/28/2018  1:44 PM      Note:  This note was prepared with assistance of Dragon voice recognition software. Occasional wrong-word or sound-a-like substitutions may have occurred due to the inherent limitations of voice recognition software.   Ellorie Kindall, Barbara Cower, MD 09/30/18 [phone]

## 2018-12-19 ENCOUNTER — Encounter (HOSPITAL_COMMUNITY): Payer: Self-pay | Admitting: Emergency Medicine

## 2018-12-19 ENCOUNTER — Other Ambulatory Visit: Payer: Self-pay

## 2018-12-19 ENCOUNTER — Emergency Department (HOSPITAL_COMMUNITY): Payer: Self-pay

## 2018-12-19 ENCOUNTER — Emergency Department (HOSPITAL_COMMUNITY)
Admission: EM | Admit: 2018-12-19 | Discharge: 2018-12-20 | Disposition: A | Discharge status: Home / Self Care | Payer: Self-pay | Attending: Emergency Medicine | Admitting: Emergency Medicine

## 2018-12-19 DIAGNOSIS — I10 Essential (primary) hypertension: Secondary | ICD-10-CM | POA: Insufficient documentation

## 2018-12-19 DIAGNOSIS — F17210 Nicotine dependence, cigarettes, uncomplicated: Secondary | ICD-10-CM | POA: Insufficient documentation

## 2018-12-19 DIAGNOSIS — J069 Acute upper respiratory infection, unspecified: Secondary | ICD-10-CM | POA: Insufficient documentation

## 2018-12-19 DIAGNOSIS — B9789 Other viral agents as the cause of diseases classified elsewhere: Secondary | ICD-10-CM

## 2018-12-19 NOTE — ED Notes (Signed)
Patient stated he has a cough and runny nose. Denies any other symptoms. Patient stated his roommates will not allow him to return home unless he gets tested for COVID 19.

## 2018-12-19 NOTE — ED Notes (Signed)
Gave pt cup of ice water. 

## 2018-12-19 NOTE — ED Notes (Signed)
Pt tolerating oral fluids 

## 2018-12-19 NOTE — ED Triage Notes (Signed)
Pt here by [facility] due to cough, EMS reports pt friends will not allow him in the house due to cough "until he is tested", explained to pt no tests being completed, pt reports he has had cough and runny nose x 2 weeks, pt reports he feels like it's allergies and denies any other symptoms, per EMS pt has hx Htn, pt reports he has been out of Clonidine for a couple of mos

## 2018-12-20 MED ORDER — CLONIDINE HCL 0.1 MG PO TABS
0.1000 mg | ORAL_TABLET | Freq: Once | ORAL | Status: AC
  Administered 2018-12-20: 0.1 mg via ORAL
  Filled 2018-12-20: qty 1

## 2018-12-20 MED ORDER — CLONIDINE HCL 0.1 MG PO TABS: 0.1000 mg | ORAL_TABLET | Freq: Two times a day (BID) | ORAL | 0 refills | Status: DC

## 2018-12-20 NOTE — Discharge Instructions (Addendum)
Your chest xray is clear tonight and your symptoms suggest you have a viral upper respiratory infection, and not the coronavirus, especially since you do not have a fever or shortness of breath.  Rest and increase your fluid intake.  Your blood pressure is concerningly high today and you need to make sure you do not run out of your blood pressure medicine.  I have prescribed this for you - you will need to get followup care with a primary doctor before you run out of this medicine again.  I recommend seeing the [facility] or the [facility] as listed.   [facility]  [address] [place], [place] [address] [phone]  Services [facility] offers a variety of basic health services.  Services include but are not limited to: Blood pressure checks  Heart rate checks  Blood sugar checks  Urine analysis  Rapid strep tests  Pregnancy tests.  Health education and referrals  People needing more complex services will be directed to a physician online. Using these virtual visits, doctors can evaluate and prescribe medicine and treatments. There will be no medication on-site, though [facility] will help patients fill their prescriptions at little to no cost.   For More information please go to: [email or web address]

## 2018-12-21 NOTE — ED Provider Notes (Signed)
[facility]   CSN: [id number] Arrival date & time: 12/19/18  2112    History   Chief Complaint Chief Complaint  Patient presents with  . Cough    HPI Rick Williams is a 53 y.o. male presenting with a 2 week history of nonproductive cough in association with clear rhinorrhea and nasal congestion.  He believes his symptoms are from springtime allergies, however he was told by his roommates he cannot return to the house until he is tested for coronavirus.  He denies fevers, chills, weakness, sob, chest pain, also denies headache, n/v/d and abdominal pain.  He has had occasional sneezing, denies itchy or watery eyes.  He has had no treatments for his cough or allergy symptoms.  Of note, pt's bp is very elevated today. He reports no current pcp and ran out of his clonidine over a month ago.      The history is provided by the patient.    Past Medical History:  Diagnosis Date  . Alcohol abuse   . Cerebral hemorrhage (HCC) 2014  . Hemothorax on left   . Hypertension   . Pneumonia   . Stroke [facility](HCC) 2007   no deficits    Patient Active Problem List   Diagnosis Date Noted  . Left-sided weakness 02/26/2013  . Leg weakness 02/26/2013  . Pain in joint, lower leg 02/26/2013  . Muscle weakness (generalized) 02/26/2013  . Lack of coordination 02/26/2013  . ICH (intracerebral hemorrhage) (HCC) 02/07/2013  . Essential hypertension, malignant 02/06/2013  . Encounter for long-term (current) use of high-risk medication 02/06/2013  . Hemorrhagic stroke (HCC) 01/30/2013  . Acute respiratory failure due to AMS 01/30/2013  . Hypertension 01/30/2013  . IVH (intraventricular hemorrhage) (HCC) 01/30/2013  . Intracranial hypertension 01/30/2013    History reviewed. No pertinent surgical history.      Home Medications    Prior to Admission medications   Medication Sig Start Date End Date Taking? Authorizing Provider  chlordiazePOXIDE (LIBRIUM) 25  MG capsule 50mg  PO TID x 2D, then 25-50mg  PO BID X 2D, then 25-50mg  PO QD X 1D Patient not taking: Reported on 12/19/2018 09/28/18   Mesner, Barbara CowerJason, MD  cloNIDine (CATAPRES) 0.1 MG tablet Take 1 tablet (0.1 mg total) by mouth 2 (two) times daily. 12/20/18   Burgess AmorIdol, Adreanna Fickel, PA-C    Family History History reviewed. No pertinent family history.  Social History Social History   Tobacco Use  . Smoking status: Current Some Day Smoker    Packs/day: 1.00    Types: Cigarettes  . Smokeless tobacco: Never Used  Substance Use Topics  . Alcohol use: Yes    Comment: large amount every day  . Drug use: No    Types: Marijuana    Comment: last use 2 months ago     Allergies   Benadryl [diphenhydramine hcl (sleep)]   Review of Systems Review of Systems  Constitutional: Negative for chills and fever.  HENT: Positive for congestion, rhinorrhea and sneezing. Negative for ear pain, sinus pressure, sore throat, trouble swallowing and voice change.   Eyes: Negative for discharge and itching.  Respiratory: Positive for cough. Negative for shortness of breath, wheezing and stridor.   Cardiovascular: Negative for chest pain.  Gastrointestinal: Negative for abdominal pain, diarrhea, nausea and vomiting.  Genitourinary: Negative.   Skin: Negative.   Neurological: Negative for dizziness and headaches.     Physical Exam Updated Vital Signs BP (!) 181/123 (BP Location: Left Arm)   Pulse 74  Temp 98.4 F (36.9 C) (Oral)   Resp 18   Ht 5\' 10"  (1.778 m)   Wt 78.5 kg   SpO2 95%   BMI 24.82 kg/m   Physical Exam Constitutional:      Appearance: He is well-developed.  HENT:     Head: Normocephalic and atraumatic.     Right Ear: Tympanic membrane and ear canal normal.     Left Ear: Tympanic membrane and ear canal normal.     Nose: Mucosal edema and rhinorrhea present. No congestion.     Mouth/Throat:     Pharynx: Uvula midline. No oropharyngeal exudate or posterior oropharyngeal erythema.      Tonsils: No tonsillar abscesses.  Eyes:     Conjunctiva/sclera: Conjunctivae normal.  Neck:     Musculoskeletal: No muscular tenderness.  Cardiovascular:     Rate and Rhythm: Normal rate.     Heart sounds: Normal heart sounds.  Pulmonary:     Effort: Pulmonary effort is normal. No respiratory distress.     Breath sounds: No wheezing, rhonchi or rales.  Abdominal:     Palpations: Abdomen is soft.     Tenderness: There is no abdominal tenderness.  Musculoskeletal: Normal range of motion.  Lymphadenopathy:     Cervical: No cervical adenopathy.  Skin:    General: Skin is warm and dry.     Findings: No rash.  Neurological:     Mental Status: He is alert and oriented to person, place, and time.      ED Treatments / Results  Labs (all labs ordered are listed, but only abnormal results are displayed) Labs Reviewed - No data to display  EKG None  Radiology Dg Chest 2 View  Result Date: 12/20/2018 CLINICAL DATA:  53 year old male with cough. EXAM: CHEST - 2 VIEW COMPARISON:  Chest radiograph dated 09/24/2018 FINDINGS: There is no focal consolidation, pleural effusion, or pneumothorax. Mild interstitial prominence, likely chronic. The cardiac silhouette is within normal limits. No acute osseous pathology. IMPRESSION: No active cardiopulmonary disease. Electronically Signed   By: Elgie Collard M.D.   On: 12/20/2018 01:25    Procedures Procedures (including critical care time)  Medications Ordered in ED Medications  cloNIDine (CATAPRES) tablet 0.1 mg (0.1 mg Oral Given 12/20/18 0203)     Initial Impression / Assessment and Plan / ED Course  I have reviewed the triage vital signs and the nursing notes.  Pertinent labs & imaging results that were available during my care of the patient were reviewed by me and considered in my medical decision making (see chart for details).        Pt with cough and congestion, possible viral uri vs allergy.  He also has very elevated bp  with noncompliance secondary to lack of f/u care.  No sx suggesting end organ damage. His clonidine was refilled, first dose given here.  He has no sob, no fever or h/o fever, no suggestion of covid 19 and he denies possible exposure to this virus.  He was given documentation of this to show his roomates.  Also given referrals for establishing a pcp with local clinic. Advised to have bp recheck within the week at [facility]. Pt agrees to this plan.  ANTWUAN RUEFF was evaluated in Emergency Department on 12/19/2018 for the symptoms described in the history of present illness. He was evaluated in the context of the global COVID-19 pandemic, which necessitated consideration that the patient might be at risk for infection with the SARS-CoV-2 virus  that causes COVID-19. Institutional protocols and algorithms that pertain to the evaluation of patients at risk for COVID-19 are in a state of rapid change based on information released by regulatory bodies including the CDC and federal and state organizations. These policies and algorithms were followed during the patient's care in the ED.   Final Clinical Impressions(s) / ED Diagnoses   Final diagnoses:  Viral URI with cough  Essential hypertension    ED Discharge Orders         Ordered    cloNIDine (CATAPRES) 0.1 MG tablet  2 times daily     12/20/18 0221           Burgess Amor, PA-C 12/21/18 1321    Samuel Jester, DO 12/23/18 1541

## 2019-02-22 ENCOUNTER — Encounter (HOSPITAL_COMMUNITY): Payer: Self-pay | Admitting: *Deleted

## 2019-02-22 ENCOUNTER — Emergency Department (HOSPITAL_COMMUNITY)
Admission: EM | Admit: 2019-02-22 | Discharge: 2019-02-22 | Disposition: A | Discharge status: Home / Self Care | Payer: Self-pay | Attending: Emergency Medicine | Admitting: Emergency Medicine

## 2019-02-22 ENCOUNTER — Other Ambulatory Visit: Payer: Self-pay

## 2019-02-22 DIAGNOSIS — Y908 Blood alcohol level of 240 mg/100 ml or more: Secondary | ICD-10-CM | POA: Insufficient documentation

## 2019-02-22 DIAGNOSIS — D696 Thrombocytopenia, unspecified: Secondary | ICD-10-CM | POA: Insufficient documentation

## 2019-02-22 DIAGNOSIS — D7589 Other specified diseases of blood and blood-forming organs: Secondary | ICD-10-CM | POA: Insufficient documentation

## 2019-02-22 DIAGNOSIS — F10920 Alcohol use, unspecified with intoxication, uncomplicated: Secondary | ICD-10-CM

## 2019-02-22 DIAGNOSIS — Z79899 Other long term (current) drug therapy: Secondary | ICD-10-CM | POA: Insufficient documentation

## 2019-02-22 DIAGNOSIS — I10 Essential (primary) hypertension: Secondary | ICD-10-CM | POA: Insufficient documentation

## 2019-02-22 DIAGNOSIS — F17210 Nicotine dependence, cigarettes, uncomplicated: Secondary | ICD-10-CM | POA: Insufficient documentation

## 2019-02-22 DIAGNOSIS — Z8673 Personal history of transient ischemic attack (TIA), and cerebral infarction without residual deficits: Secondary | ICD-10-CM | POA: Insufficient documentation

## 2019-02-22 DIAGNOSIS — F10220 Alcohol dependence with intoxication, uncomplicated: Secondary | ICD-10-CM | POA: Insufficient documentation

## 2019-02-22 DIAGNOSIS — E876 Hypokalemia: Secondary | ICD-10-CM | POA: Insufficient documentation

## 2019-02-22 DIAGNOSIS — R748 Abnormal levels of other serum enzymes: Secondary | ICD-10-CM | POA: Insufficient documentation

## 2019-02-22 LAB — COMPREHENSIVE METABOLIC PANEL
ALT: 38 U/L (ref 0–44)
AST: 94 U/L — ABNORMAL HIGH (ref 15–41)
Albumin: 3.3 g/dL — ABNORMAL LOW (ref 3.5–5.0)
Alkaline Phosphatase: 73 U/L (ref 38–126)
Anion gap: 8 (ref 5–15)
BUN: 11 mg/dL (ref 6–20)
CO2: 26 mmol/L (ref 22–32)
Calcium: 8.2 mg/dL — ABNORMAL LOW (ref 8.9–10.3)
Chloride: 99 mmol/L (ref 98–111)
Creatinine, Ser: 0.95 mg/dL (ref 0.61–1.24)
GFR calc Af Amer: >60 mL/min (ref >60)
GFR calc non Af Amer: >60 mL/min (ref >60)
Glucose, Bld: 113 mg/dL — ABNORMAL HIGH (ref 70–99)
Potassium: 3.2 mmol/L — ABNORMAL LOW (ref 3.5–5.1)
Sodium: 133 mmol/L — ABNORMAL LOW (ref 135–145)
Total Bilirubin: 0.7 mg/dL (ref 0.3–1.2)
Total Protein: 8.2 g/dL — ABNORMAL HIGH (ref 6.5–8.1)

## 2019-02-22 LAB — CBC WITH DIFFERENTIAL/PLATELET
Abs Immature Granulocytes: 0.02 10*3/uL (ref 0.00–0.07)
Basophils Absolute: 0.1 10*3/uL (ref 0.0–0.1)
Basophils Relative: 1 %
Eosinophils Absolute: 0.1 10*3/uL (ref 0.0–0.5)
Eosinophils Relative: 2 %
HCT: 39.5 % (ref 39.0–52.0)
Hemoglobin: 13.5 g/dL (ref 13.0–17.0)
Immature Granulocytes: 0 %
Lymphocytes Relative: 52 %
Lymphs Abs: 3.1 10*3/uL (ref 0.7–4.0)
MCH: 35.2 pg — ABNORMAL HIGH (ref 26.0–34.0)
MCHC: 34.2 g/dL (ref 30.0–36.0)
MCV: 102.9 fL — ABNORMAL HIGH (ref 80.0–100.0)
Monocytes Absolute: 0.7 10*3/uL (ref 0.1–1.0)
Monocytes Relative: 12 %
Neutro Abs: 1.9 10*3/uL (ref 1.7–7.7)
Neutrophils Relative %: 33 %
Platelets: 133 10*3/uL — ABNORMAL LOW (ref 150–400)
RBC: 3.84 MIL/uL — ABNORMAL LOW (ref 4.22–5.81)
RDW: 13.7 % (ref 11.5–15.5)
WBC: 5.9 10*3/uL (ref 4.0–10.5)
nRBC: 0.3 % — ABNORMAL HIGH (ref 0.0–0.2)

## 2019-02-22 LAB — LIPASE, BLOOD: Lipase: 66 U/L — ABNORMAL HIGH (ref 11–51)

## 2019-02-22 LAB — ETHANOL: Alcohol, Ethyl (B): 489 mg/dL (ref <10)

## 2019-02-22 MED ORDER — POTASSIUM CHLORIDE CRYS ER 20 MEQ PO TBCR
40.0000 meq | EXTENDED_RELEASE_TABLET | Freq: Once | ORAL | Status: AC
  Administered 2019-02-22: 40 meq via ORAL
  Filled 2019-02-22: qty 2

## 2019-02-22 NOTE — ED Notes (Signed)
Patient sleeping

## 2019-02-22 NOTE — ED Notes (Signed)
Date and time results received: 02/22/19 02:35 (use smartphrase ".now" to insert current time)  Test: Alcohol Ethyl Critical Value: 489  Name of Provider Notified: Preston Fleeting  Orders Received? Or Actions Taken?:Dr. Preston Fleeting notified.

## 2019-02-22 NOTE — Discharge Instructions (Addendum)
Follow up with your md in 1-2 days.   Follow up with [facility] to stop drinking

## 2019-02-22 NOTE — ED Triage Notes (Signed)
Pt brought in by [facility] for c/o alcohol intoxication; sister reported to ems that pt has been drinking wine all day; pt states he drinks everyday

## 2019-02-22 NOTE — ED Provider Notes (Signed)
[facility] EMERGENCY DEPARTMENT Provider Note   CSN: [id number] Arrival date & time: 02/22/19  0018    History   Chief Complaint Chief Complaint  Patient presents with  . Alcohol Intoxication    HPI Rick Williams is a 53 y.o. male.   The history is provided by the patient. The history is limited by the condition of the patient (Intoxicated).  He has history of hypertension, stroke, alcohol abuse and was brought in by ambulance because of alcohol intoxication.  Patient is only able to give very simple answers to questions, but does admit to drinking today as well as using cocaine and marijuana.  Past Medical History:  Diagnosis Date  . Alcohol abuse   . Cerebral hemorrhage (HCC) 2014  . Hemothorax on left   . Hypertension   . Pneumonia   . Stroke [facility]) 2007   no deficits    Patient Active Problem List   Diagnosis Date Noted  . Left-sided weakness 02/26/2013  . Leg weakness 02/26/2013  . Pain in joint, lower leg 02/26/2013  . Muscle weakness (generalized) 02/26/2013  . Lack of coordination 02/26/2013  . ICH (intracerebral hemorrhage) (HCC) 02/07/2013  . Essential hypertension, malignant 02/06/2013  . Encounter for long-term (current) use of high-risk medication 02/06/2013  . Hemorrhagic stroke (HCC) 01/30/2013  . Acute respiratory failure due to AMS 01/30/2013  . Hypertension 01/30/2013  . IVH (intraventricular hemorrhage) (HCC) 01/30/2013  . Intracranial hypertension 01/30/2013    History reviewed. No pertinent surgical history.      Home Medications    Prior to Admission medications   Medication Sig Start Date End Date Taking? Authorizing Provider  chlordiazePOXIDE (LIBRIUM) 25 MG capsule 50mg  PO TID x 2D, then 25-50mg  PO BID X 2D, then 25-50mg  PO QD X 1D Patient not taking: Reported on 12/19/2018 09/28/18   Mesner, Barbara Cower, MD  cloNIDine (CATAPRES) 0.1 MG tablet Take 1 tablet (0.1 mg total) by mouth 2 (two) times daily. 12/20/18   Burgess Amor, PA-C     Family History History reviewed. No pertinent family history.  Social History Social History   Tobacco Use  . Smoking status: Current Some Day Smoker    Packs/day: 1.00    Types: Cigarettes  . Smokeless tobacco: Never Used  Substance Use Topics  . Alcohol use: Yes    Comment: large amount every day  . Drug use: No    Types: Marijuana    Comment: last use 2 months ago     Allergies   Benadryl [diphenhydramine hcl (sleep)]   Review of Systems Review of Systems  Unable to perform ROS: Mental status change     Physical Exam Updated Vital Signs BP (!) 142/100   Pulse 97   Temp 98 F (36.7 C) (Oral)   Resp 18   Wt 78.5 kg   SpO2 100%   BMI 24.83 kg/m   Physical Exam Vitals signs and nursing note reviewed.    53 year old male, resting comfortably and in no acute distress. Vital signs are significant for elevated blood pressure. Oxygen saturation is 100%, which is normal.  He is somnolent but arousable, but will only give 1 or 2 word answers to questions. Head is normocephalic and atraumatic. PERRLA, EOMI. Oropharynx is clear. Neck is nontender and supple without adenopathy or JVD. Back is nontender and there is no CVA tenderness. Lungs are clear without rales, wheezes, or rhonchi. Chest is nontender. Heart has regular rate and rhythm without murmur. Abdomen is soft, flat, nontender  without masses or hepatosplenomegaly and peristalsis is normoactive. Extremities have no cyanosis or edema, full range of motion is present. Skin is warm and dry without rash. Neurologic: Mental status is as noted above, cranial nerves are intact, there are no gross motor or sensory deficits.  ED Treatments / Results  Labs (all labs ordered are listed, but only abnormal results are displayed) Labs Reviewed  COMPREHENSIVE METABOLIC PANEL - Abnormal; Notable for the following components:      Result Value   Sodium 133 (*)    Potassium 3.2 (*)    Glucose, Bld 113 (*)    Calcium 8.2  (*)    Total Protein 8.2 (*)    Albumin 3.3 (*)    AST 94 (*)    All other components within normal limits  ETHANOL - Abnormal; Notable for the following components:   Alcohol, Ethyl (B) 489 (*)    All other components within normal limits  LIPASE, BLOOD - Abnormal; Notable for the following components:   Lipase 66 (*)    All other components within normal limits  CBC WITH DIFFERENTIAL/PLATELET - Abnormal; Notable for the following components:   RBC 3.84 (*)    MCV 102.9 (*)    MCH 35.2 (*)    Platelets 133 (*)    nRBC 0.3 (*)    All other components within normal limits  RAPID URINE DRUG SCREEN, HOSP PERFORMED    Procedures Procedures   Medications Ordered in ED Medications  potassium chloride SA (K-DUR) CR tablet 40 mEq (40 mEq Oral Given 02/22/19 0732)     Initial Impression / Assessment and Plan / ED Course  I have reviewed the triage vital signs and the nursing notes.  Pertinent lab results that were available during my care of the patient were reviewed by me and considered in my medical decision making (see chart for details).  Alcohol intoxication with polysubstance abuse.  Old records are reviewed, and he does have prior ED visits for substance abuse.  He will need to be observed in the ED until he is ambulatory, will need to reassess mental status once alcohol level has come down.  Ethanol level is come back markedly elevated at 489.  Patient was observed overnight and reevaluated and he was awake and alert.  Will ambulate to see if he is steady on his feet.  He will be discharged with resources for detox programs.  Other labs showed mild hypokalemia and is given a dose of oral potassium, mild elevation of lipase which is not felt to be clinically significant, macrocytosis and mild thrombocytopenia.  Final Clinical Impressions(s) / ED Diagnoses   Final diagnoses:  Alcoholic intoxication without complication (HCC)  Hypokalemia  Elevated lipase  Macrocytosis   Thrombocytopenia [facility])    ED Discharge Orders    None       Dione Booze, MD 02/22/19 [phone]

## 2019-02-22 NOTE — ED Notes (Signed)
Patient able to ambulate around nurses station with no difficulty.

## 2019-10-23 ENCOUNTER — Emergency Department (HOSPITAL_COMMUNITY): Payer: Self-pay

## 2019-10-23 ENCOUNTER — Emergency Department (HOSPITAL_COMMUNITY)
Admission: EM | Admit: 2019-10-23 | Discharge: 2019-10-23 | Disposition: A | Discharge status: Home / Self Care | Payer: Self-pay | Attending: Emergency Medicine | Admitting: Emergency Medicine

## 2019-10-23 ENCOUNTER — Other Ambulatory Visit: Payer: Self-pay

## 2019-10-23 ENCOUNTER — Encounter (HOSPITAL_COMMUNITY): Payer: Self-pay | Admitting: Emergency Medicine

## 2019-10-23 DIAGNOSIS — F10920 Alcohol use, unspecified with intoxication, uncomplicated: Secondary | ICD-10-CM | POA: Insufficient documentation

## 2019-10-23 DIAGNOSIS — R519 Headache, unspecified: Secondary | ICD-10-CM | POA: Insufficient documentation

## 2019-10-23 DIAGNOSIS — Y908 Blood alcohol level of 240 mg/100 ml or more: Secondary | ICD-10-CM | POA: Insufficient documentation

## 2019-10-23 DIAGNOSIS — I6381 Other cerebral infarction due to occlusion or stenosis of small artery: Secondary | ICD-10-CM | POA: Insufficient documentation

## 2019-10-23 DIAGNOSIS — Z20822 Contact with and (suspected) exposure to covid-19: Secondary | ICD-10-CM | POA: Insufficient documentation

## 2019-10-23 LAB — URINALYSIS, ROUTINE W REFLEX MICROSCOPIC
Bacteria, UA: NONE SEEN
Bilirubin Urine: NEGATIVE
Glucose, UA: NEGATIVE mg/dL
Ketones, ur: NEGATIVE mg/dL
Leukocytes,Ua: NEGATIVE
Nitrite: NEGATIVE
Protein, ur: 100 mg/dL — AB
Specific Gravity, Urine: 1.009 (ref 1.005–1.030)
pH: 5 (ref 5.0–8.0)

## 2019-10-23 LAB — BASIC METABOLIC PANEL
Anion gap: 12 (ref 5–15)
BUN: 11 mg/dL (ref 6–20)
CO2: 29 mmol/L (ref 22–32)
Calcium: 8.8 mg/dL — ABNORMAL LOW (ref 8.9–10.3)
Chloride: 97 mmol/L — ABNORMAL LOW (ref 98–111)
Creatinine, Ser: 0.95 mg/dL (ref 0.61–1.24)
GFR calc Af Amer: >60 mL/min (ref >60)
GFR calc non Af Amer: >60 mL/min (ref >60)
Glucose, Bld: 99 mg/dL (ref 70–99)
Potassium: 3.9 mmol/L (ref 3.5–5.1)
Sodium: 138 mmol/L (ref 135–145)

## 2019-10-23 LAB — CBC
HCT: 41.6 % (ref 39.0–52.0)
Hemoglobin: 14.2 g/dL (ref 13.0–17.0)
MCH: 36.2 pg — ABNORMAL HIGH (ref 26.0–34.0)
MCHC: 34.1 g/dL (ref 30.0–36.0)
MCV: 106.1 fL — ABNORMAL HIGH (ref 80.0–100.0)
Platelets: 157 10*3/uL (ref 150–400)
RBC: 3.92 MIL/uL — ABNORMAL LOW (ref 4.22–5.81)
RDW: 13.2 % (ref 11.5–15.5)
WBC: 7.4 10*3/uL (ref 4.0–10.5)
nRBC: 0 % (ref 0.0–0.2)

## 2019-10-23 LAB — ETHANOL: Alcohol, Ethyl (B): 343 mg/dL (ref <10)

## 2019-10-23 LAB — RESPIRATORY PANEL BY RT PCR (FLU A&B, COVID)
Influenza A by PCR: NEGATIVE
Influenza B by PCR: NEGATIVE
SARS Coronavirus 2 by RT PCR: NEGATIVE

## 2019-10-23 MED ORDER — CLONIDINE HCL 0.2 MG PO TABS
0.2000 mg | ORAL_TABLET | Freq: Once | ORAL | Status: AC
  Administered 2019-10-23: 09:00:00 0.1 mg via ORAL
  Filled 2019-10-23: qty 1

## 2019-10-23 MED ORDER — METOCLOPRAMIDE HCL 5 MG/ML IJ SOLN
10.0000 mg | Freq: Once | INTRAMUSCULAR | Status: AC
  Administered 2019-10-23: 10 mg via INTRAVENOUS
  Filled 2019-10-23: qty 2

## 2019-10-23 MED ORDER — CLONIDINE HCL 0.1 MG PO TABS: 0.2000 mg | ORAL_TABLET | Freq: Two times a day (BID) | ORAL | 1 refills | Status: DC

## 2019-10-23 MED ORDER — SODIUM CHLORIDE 0.9% FLUSH: 3.0000 mL | Freq: Once | INTRAVENOUS | Status: DC

## 2019-10-23 NOTE — Discharge Instructions (Signed)
Make sure you take your blood pressure medicines or you will have a large stroke and possibly die.  Stop drinking alcohol.  Follow-up with Dr. Gerilyn Pilgrim in 2 weeks

## 2019-10-23 NOTE — ED Triage Notes (Signed)
Pt states he does not feel good but cannot elaborate. Pt states he feels like he did when he had a stroke and aneurysm in 2015.

## 2019-10-23 NOTE — ED Notes (Signed)
Pt back from CT

## 2019-10-23 NOTE — ED Provider Notes (Signed)
[facility] EMERGENCY DEPARTMENT Provider Note   CSN: [id number] Arrival date & time: 10/23/19  3154   Time seen 4:45 AM  History Chief Complaint  Patient presents with  . Weakness    Rick Williams is a 54 y.o. male.  HPI   Patient states about noon he felt like he was having another stroke and aneurysm.  He states he has a right posterior headache and he cannot see out of both eyes.  He states it is extremely blurred.  He states if I had not of spoken he would not have known I was in the room.  However patient is able to look at the remote and turn off the TV.  He states he has chronic numbness in both arms and legs.  He feels like both legs are weak today, he denies weakness in his arms but states his hands feel weak.  Patient has a history of alcohol abuse however he states he had had anything to drink in a week.  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  PCP PCP [facility] Department    Past Medical History:  Diagnosis Date  . Alcohol abuse   . Cerebral hemorrhage (HCC) 2014  . Hemothorax on left   . Hypertension   . Pneumonia   . Stroke [facility]) 01/17/06   no deficits    Patient Active Problem List   Diagnosis Date Noted  . Left-sided weakness 02/26/2013  . Leg weakness 02/26/2013  . Pain in joint, lower leg 02/26/2013  . Muscle weakness (generalized) 02/26/2013  . Lack of coordination 02/26/2013  . ICH (intracerebral hemorrhage) (HCC) 02/07/2013  . Essential hypertension, malignant 02/06/2013  . Encounter for long-term (current) use of high-risk medication 02/06/2013  . Hemorrhagic stroke (HCC) 01/30/2013  . Acute respiratory failure due to AMS 01/30/2013  . Hypertension 01/30/2013  . IVH (intraventricular hemorrhage) (HCC) 01/30/2013  . Intracranial hypertension 01/30/2013    History reviewed. No pertinent surgical history.     No family history on file.  Social History   Tobacco Use  . Smoking status: Current Every  Day Smoker    Packs/day: 1.00    Types: Cigarettes  . Smokeless tobacco: Never Used  Substance Use Topics  . Alcohol use: Yes    Comment: large amount every day  . Drug use: No    Types: Marijuana    Comment: last use 2 months ago    Home Medications Prior to Admission medications   Medication Sig Start Date End Date Taking? Authorizing Provider  chlordiazePOXIDE (LIBRIUM) 25 MG capsule 50mg  PO TID x 2D, then 25-50mg  PO BID X 2D, then 25-50mg  PO QD X 1D Patient not taking: Reported on 12/19/2018 09/28/18   Mesner, 11/27/18, MD  cloNIDine (CATAPRES) 0.1 MG tablet Take 1 tablet (0.1 mg total) by mouth 2 (two) times daily. 12/20/18   02/19/19, PA-C    Allergies    Benadryl [diphenhydramine hcl (sleep)]  Review of Systems   Review of Systems  All other systems reviewed and are negative.   Physical Exam Updated Vital Signs BP (!) 177/127   Pulse 89   Resp 15   Ht 5\' 10"  (1.778 m)   Wt 77.1 kg   SpO2 100%   BMI 24.39 kg/m   Physical Exam Vitals and nursing note reviewed.  Constitutional:      General: He is not in acute distress.    Appearance: Normal appearance. He is well-developed.  He is not ill-appearing or toxic-appearing.  HENT:     Head: Normocephalic and atraumatic.     Right Ear: External ear normal.     Left Ear: External ear normal.     Nose: Nose normal. No mucosal edema or rhinorrhea.     Mouth/Throat:     Mouth: Mucous membranes are moist.     Dentition: No dental abscesses.     Pharynx: No oropharyngeal exudate, posterior oropharyngeal erythema or uvula swelling.  Eyes:     Extraocular Movements: Extraocular movements intact.     Conjunctiva/sclera: Conjunctivae normal.     Pupils: Pupils are equal, round, and reactive to light.  Cardiovascular:     Rate and Rhythm: Normal rate and regular rhythm.     Heart sounds: Normal heart sounds. No murmur. No friction rub. No gallop.   Pulmonary:     Effort: Pulmonary effort is normal. No respiratory distress.       Breath sounds: Normal breath sounds. No wheezing, rhonchi or rales.  Chest:     Chest wall: No tenderness or crepitus.  Musculoskeletal:        General: No tenderness. Normal range of motion.     Cervical back: Full passive range of motion without pain, normal range of motion and neck supple.     Comments: Moves all extremities well.   Skin:    General: Skin is warm and dry.     Coloration: Skin is not pale.     Findings: No erythema or rash.  Neurological:     General: No focal deficit present.     Mental Status: He is alert and oriented to person, place, and time.     Cranial Nerves: No cranial nerve deficit.     Comments: Patient's grips are equal, he does not have pronator drift, he does not have any motor weakness.  Psychiatric:        Mood and Affect: Mood normal. Mood is not anxious.        Speech: Speech normal.        Behavior: Behavior normal.        Thought Content: Thought content normal.     ED Results / Procedures / Treatments   Labs (all labs ordered are listed, but only abnormal results are displayed) Results for orders placed or performed during the hospital encounter of 10/23/19  Basic metabolic panel  Result Value Ref Range   Sodium 138 135 - 145 mmol/L   Potassium 3.9 3.5 - 5.1 mmol/L   Chloride 97 (L) 98 - 111 mmol/L   CO2 29 22 - 32 mmol/L   Glucose, Bld 99 70 - 99 mg/dL   BUN 11 6 - 20 mg/dL   Creatinine, Ser 0.62 0.61 - 1.24 mg/dL   Calcium 8.8 (L) 8.9 - 10.3 mg/dL   GFR calc non Af Amer >60 >60 mL/min   GFR calc Af Amer >60 >60 mL/min   Anion gap 12 5 - 15  CBC  Result Value Ref Range   WBC 7.4 4.0 - 10.5 K/uL   RBC 3.92 (L) 4.22 - 5.81 MIL/uL   Hemoglobin 14.2 13.0 - 17.0 g/dL   HCT 37.6 28.3 - 15.1 %   MCV 106.1 (H) 80.0 - 100.0 fL   MCH 36.2 (H) 26.0 - 34.0 pg   MCHC 34.1 30.0 - 36.0 g/dL   RDW 76.1 60.7 - 37.1 %   Platelets 157 150 - 400 K/uL   nRBC 0.0 0.0 - 0.2 %  Urinalysis, Routine w reflex microscopic  Result Value Ref Range    Color, Urine YELLOW YELLOW   APPearance CLEAR CLEAR   Specific Gravity, Urine 1.009 1.005 - 1.030   pH 5.0 5.0 - 8.0   Glucose, UA NEGATIVE NEGATIVE mg/dL   Hgb urine dipstick SMALL (A) NEGATIVE   Bilirubin Urine NEGATIVE NEGATIVE   Ketones, ur NEGATIVE NEGATIVE mg/dL   Protein, ur 818 (A) NEGATIVE mg/dL   Nitrite NEGATIVE NEGATIVE   Leukocytes,Ua NEGATIVE NEGATIVE   RBC / HPF 0-5 0 - 5 RBC/hpf   WBC, UA 0-5 0 - 5 WBC/hpf   Bacteria, UA NONE SEEN NONE SEEN   Squamous Epithelial / LPF 0-5 0 - 5  Ethanol  Result Value Ref Range   Alcohol, Ethyl (B) 343 (HH) <10 mg/dL   Laboratory interpretation all normal except alcohol intoxication, chronic macrocytosis consistent with his history of alcoholism    EKG EKG Interpretation  Date/Time:  Tuesday October 23 2019 04:38:50 EST Ventricular Rate:  84 PR Interval:    QRS Duration: 124 QT Interval:  411 QTC Calculation: 483 R Axis:   94 Text Interpretation: Sinus rhythm Atrial premature complex Probable left atrial enlargement Left ventricular hypertrophy Anterolateral infarct, age indeterminate Artifact in lead(s) II III aVR aVL aVF V1 V2 V3 V4 V5 V6 Poor data quality Confirmed by Devoria Albe ([id number]) on 10/23/2019 4:46:52 AM   Radiology CT Head Wo Contrast  Result Date: 10/23/2019 CLINICAL DATA:  Headache and weakness. EXAM: CT HEAD WITHOUT CONTRAST TECHNIQUE: Contiguous axial images were obtained from the base of the skull through the vertex without intravenous contrast. COMPARISON:  08/16/2017 FINDINGS: Brain: Stable changes from a remote right-sided basal ganglia/insular cortex infarct with encephalomalacia and mild ex vacuo dilatation of the right lateral ventricle. Stable periventricular white matter changes. No acute intracranial findings or mass lesions. No extra-axial fluid collections are identified. The brainstem and cerebellum are grossly normal. Vascular: No hyperdense vessels or obvious aneurysm. Skull: No skull fracture or  bone lesion. Sinuses/Orbits: The paranasal sinuses and mastoid air cells are clear. The globes are intact. Other: No scalp lesions or hematoma. IMPRESSION: 1. Remote right-sided basal ganglia/insular cortex infarct. 2. No acute intracranial findings or mass lesions. Electronically Signed   By: Rudie Meyer M.D.   On: 10/23/2019 05:33    Procedures Procedures (including critical care time)  Medications Ordered in ED Medications  sodium chloride flush (NS) 0.9 % injection 3 mL (3 mLs Intravenous Not Given 10/23/19 0553)  metoCLOPramide (REGLAN) injection 10 mg (10 mg Intravenous Given 10/23/19 0553)    ED Course  I have reviewed the triage vital signs and the nursing notes.  Pertinent labs & imaging results that were available during my care of the patient were reviewed by me and considered in my medical decision making (see chart for details).    MDM Rules/Calculators/A&P                      Review of patient's chart shows he had a stroke in 2007 without deficits and then had a intraparenchymal hemorrhage within the right basal ganglia with intraventricular extension with right to left shift of 8 mm.  Patient's head CT does not show anything acute.  He was given Reglan for his headache.  MRI of the brain without contrast was ordered to rule out stroke that is not seen on CT.  6:00 AM I went in to talk to the patient about his CT and getting the MRI  however he is extremely sleepy and hard to awaken, he is also have the IV Reglan.  Patient was turned over to Dr. Roderic Palau at change of shift to get the results of his MR brain.  Final Clinical Impression(s) / ED Diagnoses Final diagnoses:  Alcoholic intoxication without complication ([place])  Right-sided headache    Rx / DC Orders  Disposition pending   Rolland Porter, MD, Barbette Or, MD 10/23/19 [phone]

## 2019-10-23 NOTE — ED Notes (Signed)
Date and time results received: 10/23/19 5:52 AM  (use smartphrase ".now" to insert current time)  Test: etoh Critical Value: 343  Name of Provider Notified: Devoria Albe  Orders Received? Or Actions Taken?: acknowledged

## 2019-10-23 NOTE — ED Notes (Signed)
Pt transported to CT at this time.

## 2019-10-23 NOTE — ED Provider Notes (Addendum)
CT scan shows 2 small lacunar infarcts recent.  I spoke with neurology and they recommend ultrasound of the carotids.  The ultrasound was not significant stenosis.  I spoke with neurology and the patient will be discharged home follow-up with neurology.  The patient has not been taking his blood pressure medicines and this has been stressed.  Patient had made it clear that he did not want to be admitted to the hospital   Bethann Berkshire, MD 10/23/19 1312    Bethann Berkshire, MD 10/23/19 1312

## 2020-01-19 ENCOUNTER — Emergency Department (HOSPITAL_COMMUNITY): Payer: Self-pay

## 2020-01-19 ENCOUNTER — Inpatient Hospital Stay (HOSPITAL_COMMUNITY)
Admission: EM | Admit: 2020-01-19 | Discharge: 2020-01-23 | DRG: 074 | Disposition: A | Discharge status: Home / Self Care | Payer: Self-pay | Attending: Internal Medicine | Admitting: Internal Medicine

## 2020-01-19 ENCOUNTER — Encounter (HOSPITAL_COMMUNITY): Payer: Self-pay | Admitting: *Deleted

## 2020-01-19 ENCOUNTER — Other Ambulatory Visit: Payer: Self-pay

## 2020-01-19 DIAGNOSIS — Z8249 Family history of ischemic heart disease and other diseases of the circulatory system: Secondary | ICD-10-CM

## 2020-01-19 DIAGNOSIS — Z79899 Other long term (current) drug therapy: Secondary | ICD-10-CM

## 2020-01-19 DIAGNOSIS — Z8701 Personal history of pneumonia (recurrent): Secondary | ICD-10-CM

## 2020-01-19 DIAGNOSIS — H547 Unspecified visual loss: Secondary | ICD-10-CM | POA: Diagnosis present

## 2020-01-19 DIAGNOSIS — I7389 Other specified peripheral vascular diseases: Secondary | ICD-10-CM | POA: Diagnosis present

## 2020-01-19 DIAGNOSIS — M21332 Wrist drop, left wrist: Principal | ICD-10-CM | POA: Diagnosis present

## 2020-01-19 DIAGNOSIS — Y908 Blood alcohol level of 240 mg/100 ml or more: Secondary | ICD-10-CM | POA: Diagnosis present

## 2020-01-19 DIAGNOSIS — R29702 NIHSS score 2: Secondary | ICD-10-CM | POA: Diagnosis not present

## 2020-01-19 DIAGNOSIS — G35 Multiple sclerosis: Secondary | ICD-10-CM | POA: Diagnosis present

## 2020-01-19 DIAGNOSIS — Z8673 Personal history of transient ischemic attack (TIA), and cerebral infarction without residual deficits: Secondary | ICD-10-CM

## 2020-01-19 DIAGNOSIS — R7303 Prediabetes: Secondary | ICD-10-CM | POA: Diagnosis present

## 2020-01-19 DIAGNOSIS — R200 Anesthesia of skin: Secondary | ICD-10-CM | POA: Diagnosis present

## 2020-01-19 DIAGNOSIS — R29898 Other symptoms and signs involving the musculoskeletal system: Secondary | ICD-10-CM | POA: Diagnosis present

## 2020-01-19 DIAGNOSIS — J439 Emphysema, unspecified: Secondary | ICD-10-CM | POA: Diagnosis present

## 2020-01-19 DIAGNOSIS — I619 Nontraumatic intracerebral hemorrhage, unspecified: Secondary | ICD-10-CM | POA: Diagnosis present

## 2020-01-19 DIAGNOSIS — F10120 Alcohol abuse with intoxication, uncomplicated: Secondary | ICD-10-CM | POA: Diagnosis present

## 2020-01-19 DIAGNOSIS — F1010 Alcohol abuse, uncomplicated: Secondary | ICD-10-CM | POA: Diagnosis present

## 2020-01-19 DIAGNOSIS — E876 Hypokalemia: Secondary | ICD-10-CM | POA: Diagnosis present

## 2020-01-19 DIAGNOSIS — D7589 Other specified diseases of blood and blood-forming organs: Secondary | ICD-10-CM

## 2020-01-19 DIAGNOSIS — R946 Abnormal results of thyroid function studies: Secondary | ICD-10-CM | POA: Diagnosis present

## 2020-01-19 DIAGNOSIS — F17210 Nicotine dependence, cigarettes, uncomplicated: Secondary | ICD-10-CM | POA: Diagnosis present

## 2020-01-19 DIAGNOSIS — Z888 Allergy status to other drugs, medicaments and biological substances status: Secondary | ICD-10-CM

## 2020-01-19 DIAGNOSIS — I10 Essential (primary) hypertension: Secondary | ICD-10-CM | POA: Diagnosis present

## 2020-01-19 DIAGNOSIS — F10920 Alcohol use, unspecified with intoxication, uncomplicated: Secondary | ICD-10-CM | POA: Diagnosis present

## 2020-01-19 DIAGNOSIS — Z20822 Contact with and (suspected) exposure to covid-19: Secondary | ICD-10-CM | POA: Diagnosis present

## 2020-01-19 DIAGNOSIS — R7401 Elevation of levels of liver transaminase levels: Secondary | ICD-10-CM | POA: Diagnosis present

## 2020-01-19 LAB — CBC WITH DIFFERENTIAL/PLATELET
Abs Immature Granulocytes: 0.01 10*3/uL (ref 0.00–0.07)
Basophils Absolute: 0.1 10*3/uL (ref 0.0–0.1)
Basophils Relative: 1 %
Eosinophils Absolute: 0.2 10*3/uL (ref 0.0–0.5)
Eosinophils Relative: 3 %
HCT: 41 % (ref 39.0–52.0)
Hemoglobin: 14 g/dL (ref 13.0–17.0)
Immature Granulocytes: 0 %
Lymphocytes Relative: 49 %
Lymphs Abs: 2.7 10*3/uL (ref 0.7–4.0)
MCH: 35.6 pg — ABNORMAL HIGH (ref 26.0–34.0)
MCHC: 34.1 g/dL (ref 30.0–36.0)
MCV: 104.3 fL — ABNORMAL HIGH (ref 80.0–100.0)
Monocytes Absolute: 0.4 10*3/uL (ref 0.1–1.0)
Monocytes Relative: 8 %
Neutro Abs: 2.2 10*3/uL (ref 1.7–7.7)
Neutrophils Relative %: 39 %
Platelets: 148 10*3/uL — ABNORMAL LOW (ref 150–400)
RBC: 3.93 MIL/uL — ABNORMAL LOW (ref 4.22–5.81)
RDW: 15.1 % (ref 11.5–15.5)
WBC: 5.6 10*3/uL (ref 4.0–10.5)
nRBC: 0.4 % — ABNORMAL HIGH (ref 0.0–0.2)

## 2020-01-19 LAB — CBG MONITORING, ED: Glucose-Capillary: 92 mg/dL (ref 70–99)

## 2020-01-19 MED ORDER — IOHEXOL 350 MG/ML SOLN
75.0000 mL | Freq: Once | INTRAVENOUS | Status: AC | PRN
  Administered 2020-01-20: 75 mL via INTRAVENOUS

## 2020-01-19 NOTE — ED Provider Notes (Signed)
Emergency Department Provider Note   I have reviewed the triage vital signs and the nursing notes.   HISTORY  Chief Complaint unable to move left hand   HPI Rick Williams is a 54 y.o. male with past history of prior stroke, hypertension, alcohol abuse presents to the emergency department with acute onset left hand weakness along with numbness.  Symptoms began yesterday afternoon.  Patient states that he was up at the [employer] waiting on work when he dozed off.  He states he was sitting down and had his arms crossed in front of him.  He was not leaning over chair or sleeping with his arm pinned.  When he woke up he had weakness in his left hand.  He also notes some numbness in the entire hand which stops at the wrist.  He has normal strength in his left arm and shoulder.  Denies any sudden severe headaches.  No vision changes although does have baseline poor vision.  No symptoms in the legs.  No speech change or gait instability.   Past Medical History:  Diagnosis Date  . Alcohol abuse   . Cerebral hemorrhage (HCC) 2014  . Hemothorax on left   . Hypertension   . Pneumonia   . Stroke [facility]) 2007   no deficits    Patient Active Problem List   Diagnosis Date Noted  . Left arm weakness 01/20/2020  . Wrist drop, left 01/20/2020  . Left wrist drop 01/20/2020  . Alcohol intoxication, uncomplicated (HCC)   . Elevated AST (SGOT)   . Macrocytosis without anemia   . Uncontrolled hypertension   . Alcohol abuse   . Visual impairment   . Left-sided weakness 02/26/2013  . Leg weakness 02/26/2013  . Pain in joint, lower leg 02/26/2013  . Muscle weakness (generalized) 02/26/2013  . Lack of coordination 02/26/2013  . ICH (intracerebral hemorrhage) (HCC) 02/07/2013  . Essential hypertension, malignant 02/06/2013  . Encounter for Jacquelynne Guedes-term (current) use of high-risk medication 02/06/2013  . History of Hemorrhagic stroke 01/30/2013  . Acute respiratory failure due to AMS  01/30/2013  . Hypertension 01/30/2013  . IVH (intraventricular hemorrhage) (HCC) 01/30/2013  . Intracranial hypertension 01/30/2013    History reviewed. No pertinent surgical history.  Allergies Benadryl [diphenhydramine hcl (sleep)]  Family History  Problem Relation Age of Onset  . Hypertension Mother   . Hypertension Father     Social History Social History   Tobacco Use  . Smoking status: Current Every Day Smoker    Packs/day: 0.50    Types: Cigarettes  . Smokeless tobacco: Never Used  Substance Use Topics  . Alcohol use: Yes    Comment: twice a week   . Drug use: Yes    Types: Marijuana    Comment: last used 2 weeks ago    Review of Systems  Constitutional: No fever/chills Eyes: No visual changes. ENT: No sore throat. Cardiovascular: Denies chest pain. Respiratory: Denies shortness of breath. Gastrointestinal: No abdominal pain.  No nausea, no vomiting.  No diarrhea.  No constipation. Genitourinary: Negative for dysuria. Musculoskeletal: Negative for back pain.  Skin: Negative for rash. Neurological: Negative for headaches. Left hand weakness and numbness.   10-point ROS otherwise negative.  ____________________________________________   PHYSICAL EXAM:  VITAL SIGNS: ED Triage Vitals  Enc Vitals Group     BP 01/19/20 2146 (!) 159/103     Pulse Rate 01/19/20 2146 90     Resp 01/19/20 2146 16     Temp 01/19/20 2146  98.1 F (36.7 C)     Temp Source 01/19/20 2146 Oral     SpO2 01/19/20 2146 93 %     Weight 01/19/20 2147 170 lb (77.1 kg)     Height 01/19/20 2147 5\' 10"  (1.778 m)   Constitutional: Alert and oriented. Well appearing and in no acute distress. Eyes: Conjunctivae are normal. PERRL. Head: Atraumatic. Nose: No congestion/rhinnorhea. Mouth/Throat: Mucous membranes are moist.   Neck: No stridor. No cervical spine tenderness to palpation. Cardiovascular: Normal rate, regular rhythm. Good peripheral circulation. Grossly normal heart sounds.    Respiratory: Normal respiratory effort.  No retractions. Lungs CTAB. Gastrointestinal: Soft and nontender. No distention.  Musculoskeletal: No lower extremity tenderness nor edema. No gross deformities of extremities. Neurologic:  Normal speech and language.  Patient with 0/5 strength extension of the right wrist and 3/5 flexion strength. Normal strength in the remaining extremities including the RUE. Decreased sensation of light touch over the right hand but normal sensation just above the right wrist.  Skin:  Skin is warm, dry and intact. No rash noted.   ____________________________________________   LABS (all labs ordered are listed, but only abnormal results are displayed)  Labs Reviewed  CBC WITH DIFFERENTIAL/PLATELET - Abnormal; Notable for the following components:      Result Value   RBC 3.93 (*)    MCV 104.3 (*)    MCH 35.6 (*)    Platelets 148 (*)    nRBC 0.4 (*)    All other components within normal limits  COMPREHENSIVE METABOLIC PANEL - Abnormal; Notable for the following components:   Calcium 8.2 (*)    Albumin 3.4 (*)    AST 49 (*)    All other components within normal limits  ETHANOL - Abnormal; Notable for the following components:   Alcohol, Ethyl (B) 260 (*)    All other components within normal limits  URINALYSIS, ROUTINE W REFLEX MICROSCOPIC - Abnormal; Notable for the following components:   Color, Urine COLORLESS (*)    Specific Gravity, Urine 1.000 (*)    All other components within normal limits  COMPREHENSIVE METABOLIC PANEL - Abnormal; Notable for the following components:   Calcium 8.1 (*)    Albumin 3.3 (*)    AST 49 (*)    All other components within normal limits  MAGNESIUM - Abnormal; Notable for the following components:   Magnesium 1.4 (*)    All other components within normal limits  CBC - Abnormal; Notable for the following components:   RBC 4.07 (*)    MCV 102.9 (*)    MCH 34.6 (*)    Platelets 145 (*)    nRBC 0.3 (*)    All other  components within normal limits  HEMOGLOBIN A1C - Abnormal; Notable for the following components:   Hgb A1c MFr Bld 5.8 (*)    All other components within normal limits  LIPID PANEL - Abnormal; Notable for the following components:   Cholesterol 206 (*)    All other components within normal limits  SEDIMENTATION RATE - Abnormal; Notable for the following components:   Sed Rate 33 (*)    All other components within normal limits  TSH - Abnormal; Notable for the following components:   TSH 4.716 (*)    All other components within normal limits  VITAMIN D 25 HYDROXY (VIT D DEFICIENCY, FRACTURES) - Abnormal; Notable for the following components:   Vit D, 25-Hydroxy 19.15 (*)    All other components within normal limits  GLUCOSE, CAPILLARY -  Abnormal; Notable for the following components:   Glucose-Capillary 122 (*)    All other components within normal limits  COMPREHENSIVE METABOLIC PANEL - Abnormal; Notable for the following components:   Sodium 131 (*)    Chloride 97 (*)    Calcium 8.2 (*)    Albumin 2.8 (*)    Total Bilirubin 1.7 (*)    All other components within normal limits  RESPIRATORY PANEL BY RT PCR (FLU A&B, COVID)  PROTIME-INR  APTT  RAPID URINE DRUG SCREEN, HOSP PERFORMED  PHOSPHORUS  HIV ANTIBODY (ROUTINE TESTING W REFLEX)  C-REACTIVE PROTEIN  VITAMIN B12  T4, FREE  GLUCOSE, CAPILLARY  FOLATE RBC  CBC WITH DIFFERENTIAL/PLATELET  CBG MONITORING, ED   ____________________________________________  EKG   EKG Interpretation  Date/Time:  Sunday Jan 20 2020 19:13:05 EDT Ventricular Rate:  81 PR Interval:    QRS Duration: 81 QT Interval:  379 QTC Calculation: 440 R Axis:   102 Text Interpretation: Sinus rhythm Ventricular premature complex Right axis deviation Consider left ventricular hypertrophy Non-specific ST-t changes Confirmed by Lajean Saver [phone]) on 01/21/2020 2:52:54 PM       ____________________________________________  RADIOLOGY  CT Head Wo  Contrast  Result Date: 01/19/2020 CLINICAL DATA:  Unable to move left hand since yesterday EXAM: CT HEAD WITHOUT CONTRAST TECHNIQUE: Contiguous axial images were obtained from the base of the skull through the vertex without intravenous contrast. COMPARISON:  MRI 10/23/2019 FINDINGS: Brain: Stable region of gliosis in the right insula/external capsule. New hypoattenuating focus in the left corona radiata may correspond to an evolving lacunar infarct seen on comparison MRI. Previously seen thalamic diffusion restriction is without CT correlate. No convincing CT evidence of vascular territory or cortically based acute infarction. No acute hemorrhage, hydrocephalus, extra-axial collection or mass lesion/mass effect. Symmetric prominence of the cisterns and sulci compatible with parenchymal volume loss. Asymmetric dilatation of the right lateral ventricle likely related to ex vacuo dilatation. Patchy areas of white matter hypoattenuation are most compatible with chronic microvascular angiopathy. Vascular: Atherosclerotic calcification of the carotid siphons. No hyperdense vessel. Skull: No calvarial fracture or suspicious osseous lesion. No scalp swelling or hematoma. Sinuses/Orbits: Paranasal sinuses and mastoid air cells are predominantly clear. Included orbital structures are unremarkable. Other: None IMPRESSION: 1. No convincing CT evidence of acute vascular territory or cortically based infarction. If there is persisting clinical concern, MRI could be obtained. 2. Tiny hypoattenuating focus in the left corona radiata may correspond to evolution of a lacunar infarct seen on comparison MRI. Previously seen thalamic diffusion restriction is without CT correlate. Stable region of gliosis in the right insula/external capsule. 3. Stable parenchymal volume loss and chronic microvascular angiopathy. Electronically Signed   By: Lovena Le M.D.   On: 01/19/2020 23:12     ____________________________________________   PROCEDURES  Procedure(s) performed:   Procedures  CRITICAL CARE Performed by: Margette Fast Total critical care time: 35 minutes Critical care time was exclusive of separately billable procedures and treating other patients. Critical care was necessary to treat or prevent imminent or life-threatening deterioration. Critical care was time spent personally by me on the following activities: development of treatment plan with patient and/or surrogate as well as nursing, discussions with consultants, evaluation of patient's response to treatment, examination of patient, obtaining history from patient or surrogate, ordering and performing treatments and interventions, ordering and review of laboratory studies, ordering and review of radiographic studies, pulse oximetry and re-evaluation of patient's condition.  Nanda Quinton, MD Emergency Medicine  ____________________________________________  INITIAL IMPRESSION / ASSESSMENT AND PLAN / ED COURSE  Pertinent labs & imaging results that were available during my care of the patient were reviewed by me and considered in my medical decision making (see chart for details).   Patient presents emergency department evaluation of acute onset left wrist weakness with some associated numbness.  Symptoms began after he woke from a nap.  Suspect peripheral etiology clinically as this seems most consistent with a wrist drop.  Patient does have some associated numbness and does have a stroke history.  Plan for CT imaging without contrast of the head along with basic labs and teleneurology consultation.  I do not have MRI at this hour at this facility.   11:15 PM  Spoke with Tele-neuro after their evaluation. They have ordered CTA head and neck and will review with final recommendations at that time.   Care transferred to Dr. Preston Fleeting.  ____________________________________________  FINAL CLINICAL  IMPRESSION(S) / ED DIAGNOSES  Final diagnoses:  Right arm numbness  Right hand weakness  Alcohol intoxication, uncomplicated (HCC)  Elevated AST (SGOT)  Macrocytosis without anemia    Note:  This document was prepared using Dragon voice recognition software and may include unintentional dictation errors.  Alona Bene, MD, [facility] Emergency Medicine    Lawonda Pretlow, Arlyss Repress, MD 01/22/20 1257

## 2020-01-19 NOTE — ED Notes (Signed)
Tele neuro exam complete.

## 2020-01-19 NOTE — ED Triage Notes (Signed)
Pt c/o of no feeling and unable to move left hand since yesterday around 1300.  Pt with hx of CVA and hx of same in the past. Denies any other symptoms.

## 2020-01-20 ENCOUNTER — Observation Stay (HOSPITAL_COMMUNITY): Payer: Self-pay

## 2020-01-20 ENCOUNTER — Inpatient Hospital Stay (HOSPITAL_COMMUNITY): Payer: Self-pay

## 2020-01-20 DIAGNOSIS — F1010 Alcohol abuse, uncomplicated: Secondary | ICD-10-CM | POA: Diagnosis present

## 2020-01-20 DIAGNOSIS — R7401 Elevation of levels of liver transaminase levels: Secondary | ICD-10-CM | POA: Diagnosis present

## 2020-01-20 DIAGNOSIS — I10 Essential (primary) hypertension: Secondary | ICD-10-CM | POA: Insufficient documentation

## 2020-01-20 DIAGNOSIS — F10920 Alcohol use, unspecified with intoxication, uncomplicated: Secondary | ICD-10-CM | POA: Diagnosis present

## 2020-01-20 DIAGNOSIS — D7589 Other specified diseases of blood and blood-forming organs: Secondary | ICD-10-CM | POA: Insufficient documentation

## 2020-01-20 DIAGNOSIS — G459 Transient cerebral ischemic attack, unspecified: Secondary | ICD-10-CM

## 2020-01-20 DIAGNOSIS — H547 Unspecified visual loss: Secondary | ICD-10-CM

## 2020-01-20 DIAGNOSIS — M21332 Wrist drop, left wrist: Secondary | ICD-10-CM | POA: Diagnosis present

## 2020-01-20 DIAGNOSIS — R29898 Other symptoms and signs involving the musculoskeletal system: Secondary | ICD-10-CM | POA: Diagnosis present

## 2020-01-20 LAB — SEDIMENTATION RATE: Sed Rate: 33 mm/hr — ABNORMAL HIGH (ref 0–16)

## 2020-01-20 LAB — COMPREHENSIVE METABOLIC PANEL
ALT: 15 U/L (ref 0–44)
ALT: 15 U/L (ref 0–44)
AST: 49 U/L — ABNORMAL HIGH (ref 15–41)
AST: 49 U/L — ABNORMAL HIGH (ref 15–41)
Albumin: 3.4 g/dL — ABNORMAL LOW (ref 3.5–5.0)
Albumin: 3.3 g/dL — ABNORMAL LOW (ref 3.5–5.0)
Alkaline Phosphatase: 68 U/L (ref 38–126)
Alkaline Phosphatase: 67 U/L (ref 38–126)
Anion gap: 11 (ref 5–15)
Anion gap: 10 (ref 5–15)
BUN: 8 mg/dL (ref 6–20)
BUN: 8 mg/dL (ref 6–20)
CO2: 24 mmol/L (ref 22–32)
CO2: 23 mmol/L (ref 22–32)
Calcium: 8.2 mg/dL — ABNORMAL LOW (ref 8.9–10.3)
Calcium: 8.1 mg/dL — ABNORMAL LOW (ref 8.9–10.3)
Chloride: 102 mmol/L (ref 98–111)
Chloride: 105 mmol/L (ref 98–111)
Creatinine, Ser: 0.89 mg/dL (ref 0.61–1.24)
Creatinine, Ser: 0.86 mg/dL (ref 0.61–1.24)
GFR calc Af Amer: >60 mL/min (ref >60)
GFR calc Af Amer: >60 mL/min (ref >60)
GFR calc non Af Amer: >60 mL/min (ref >60)
GFR calc non Af Amer: >60 mL/min (ref >60)
Glucose, Bld: 97 mg/dL (ref 70–99)
Glucose, Bld: 71 mg/dL (ref 70–99)
Potassium: 3.5 mmol/L (ref 3.5–5.1)
Potassium: 3.9 mmol/L (ref 3.5–5.1)
Sodium: 137 mmol/L (ref 135–145)
Sodium: 138 mmol/L (ref 135–145)
Total Bilirubin: 0.8 mg/dL (ref 0.3–1.2)
Total Bilirubin: 0.9 mg/dL (ref 0.3–1.2)
Total Protein: 8.1 g/dL (ref 6.5–8.1)
Total Protein: 7.8 g/dL (ref 6.5–8.1)

## 2020-01-20 LAB — URINALYSIS, ROUTINE W REFLEX MICROSCOPIC
Bilirubin Urine: NEGATIVE
Glucose, UA: NEGATIVE mg/dL
Hgb urine dipstick: NEGATIVE
Ketones, ur: NEGATIVE mg/dL
Leukocytes,Ua: NEGATIVE
Nitrite: NEGATIVE
Protein, ur: NEGATIVE mg/dL
Specific Gravity, Urine: 1 — ABNORMAL LOW (ref 1.005–1.030)
pH: 6 (ref 5.0–8.0)

## 2020-01-20 LAB — CBC
HCT: 41.9 % (ref 39.0–52.0)
Hemoglobin: 14.1 g/dL (ref 13.0–17.0)
MCH: 34.6 pg — ABNORMAL HIGH (ref 26.0–34.0)
MCHC: 33.7 g/dL (ref 30.0–36.0)
MCV: 102.9 fL — ABNORMAL HIGH (ref 80.0–100.0)
Platelets: 145 10*3/uL — ABNORMAL LOW (ref 150–400)
RBC: 4.07 MIL/uL — ABNORMAL LOW (ref 4.22–5.81)
RDW: 15 % (ref 11.5–15.5)
WBC: 8 10*3/uL (ref 4.0–10.5)
nRBC: 0.3 % — ABNORMAL HIGH (ref 0.0–0.2)

## 2020-01-20 LAB — PROTIME-INR
INR: 1.1 (ref 0.8–1.2)
Prothrombin Time: 13.8 seconds (ref 11.4–15.2)

## 2020-01-20 LAB — LIPID PANEL
Cholesterol: 206 mg/dL — ABNORMAL HIGH (ref 0–200)
HDL: 85 mg/dL (ref >40)
LDL Cholesterol: 92 mg/dL (ref 0–99)
Total CHOL/HDL Ratio: 2.4 RATIO
Triglycerides: 143 mg/dL (ref <150)
VLDL: 29 mg/dL (ref 0–40)

## 2020-01-20 LAB — C-REACTIVE PROTEIN: CRP: 0.5 mg/dL (ref <1.0)

## 2020-01-20 LAB — RAPID URINE DRUG SCREEN, HOSP PERFORMED
Amphetamines: NOT DETECTED
Barbiturates: NOT DETECTED
Benzodiazepines: NOT DETECTED
Cocaine: NOT DETECTED
Opiates: NOT DETECTED
Tetrahydrocannabinol: NOT DETECTED

## 2020-01-20 LAB — PHOSPHORUS: Phosphorus: 3.6 mg/dL (ref 2.5–4.6)

## 2020-01-20 LAB — RESPIRATORY PANEL BY RT PCR (FLU A&B, COVID)
Influenza A by PCR: NEGATIVE
Influenza B by PCR: NEGATIVE
SARS Coronavirus 2 by RT PCR: NEGATIVE

## 2020-01-20 LAB — T4, FREE: Free T4: 0.63 ng/dL (ref 0.61–1.12)

## 2020-01-20 LAB — VITAMIN B12: Vitamin B-12: 277 pg/mL (ref 180–914)

## 2020-01-20 LAB — APTT: aPTT: 35 seconds (ref 24–36)

## 2020-01-20 LAB — TSH: TSH: 4.716 u[IU]/mL — ABNORMAL HIGH (ref 0.350–4.500)

## 2020-01-20 LAB — HEMOGLOBIN A1C
Hgb A1c MFr Bld: 5.8 % — ABNORMAL HIGH (ref 4.8–5.6)
Mean Plasma Glucose: 119.76 mg/dL

## 2020-01-20 LAB — ETHANOL: Alcohol, Ethyl (B): 260 mg/dL — ABNORMAL HIGH (ref <10)

## 2020-01-20 LAB — HIV ANTIBODY (ROUTINE TESTING W REFLEX): HIV Screen 4th Generation wRfx: NONREACTIVE

## 2020-01-20 LAB — VITAMIN D 25 HYDROXY (VIT D DEFICIENCY, FRACTURES): Vit D, 25-Hydroxy: 19.15 ng/mL — ABNORMAL LOW (ref 30–100)

## 2020-01-20 LAB — MAGNESIUM: Magnesium: 1.4 mg/dL — ABNORMAL LOW (ref 1.7–2.4)

## 2020-01-20 MED ORDER — CLONIDINE HCL 0.2 MG PO TABS
0.2000 mg | ORAL_TABLET | Freq: Two times a day (BID) | ORAL | Status: DC
  Administered 2020-01-20 – 2020-01-23 (×7): 0.2 mg via ORAL
  Filled 2020-01-20 (×7): qty 1

## 2020-01-20 MED ORDER — SODIUM CHLORIDE 0.9 % IV SOLN: INTRAVENOUS | Status: DC

## 2020-01-20 MED ORDER — FOLIC ACID 1 MG PO TABS
1.0000 mg | ORAL_TABLET | Freq: Every day | ORAL | Status: DC
  Administered 2020-01-20 – 2020-01-23 (×4): 1 mg via ORAL
  Filled 2020-01-20 (×4): qty 1

## 2020-01-20 MED ORDER — PANTOPRAZOLE SODIUM 40 MG IV SOLR
40.0000 mg | INTRAVENOUS | Status: DC
  Administered 2020-01-20 – 2020-01-21 (×2): 40 mg via INTRAVENOUS
  Filled 2020-01-20 (×2): qty 40

## 2020-01-20 MED ORDER — ENOXAPARIN SODIUM 40 MG/0.4ML ~~LOC~~ SOLN
40.0000 mg | Freq: Every day | SUBCUTANEOUS | Status: DC
  Administered 2020-01-20 – 2020-01-22 (×3): 40 mg via SUBCUTANEOUS
  Filled 2020-01-20 (×4): qty 0.4

## 2020-01-20 MED ORDER — ACETAMINOPHEN 325 MG PO TABS: 650.0000 mg | ORAL_TABLET | ORAL | Status: DC | PRN

## 2020-01-20 MED ORDER — HYDRALAZINE HCL 25 MG PO TABS: 25.0000 mg | ORAL_TABLET | Freq: Four times a day (QID) | ORAL | Status: DC | PRN

## 2020-01-20 MED ORDER — THIAMINE HCL 100 MG PO TABS
100.0000 mg | ORAL_TABLET | Freq: Every day | ORAL | Status: DC
  Administered 2020-01-20 – 2020-01-23 (×4): 100 mg via ORAL
  Filled 2020-01-20 (×4): qty 1

## 2020-01-20 MED ORDER — ACETAMINOPHEN 160 MG/5ML PO SOLN: 650.0000 mg | ORAL | Status: DC | PRN

## 2020-01-20 MED ORDER — SENNOSIDES-DOCUSATE SODIUM 8.6-50 MG PO TABS
1.0000 | ORAL_TABLET | Freq: Every evening | ORAL | Status: DC | PRN
  Filled 2020-01-20: qty 1

## 2020-01-20 MED ORDER — METOPROLOL TARTRATE 5 MG/5ML IV SOLN: 5.0000 mg | Freq: Four times a day (QID) | INTRAVENOUS | Status: DC | PRN

## 2020-01-20 MED ORDER — MAGNESIUM SULFATE 4 GM/100ML IV SOLN
4.0000 g | Freq: Once | INTRAVENOUS | Status: AC
  Administered 2020-01-20: 4 g via INTRAVENOUS
  Filled 2020-01-20: qty 100

## 2020-01-20 MED ORDER — ONDANSETRON HCL 4 MG/2ML IJ SOLN
4.0000 mg | Freq: Four times a day (QID) | INTRAMUSCULAR | Status: DC | PRN
  Administered 2020-01-20: 4 mg via INTRAVENOUS
  Filled 2020-01-20: qty 2

## 2020-01-20 MED ORDER — ATORVASTATIN CALCIUM 40 MG PO TABS
40.0000 mg | ORAL_TABLET | Freq: Every day | ORAL | Status: DC
  Administered 2020-01-20 – 2020-01-23 (×4): 40 mg via ORAL
  Filled 2020-01-20 (×4): qty 1

## 2020-01-20 MED ORDER — LORAZEPAM 1 MG PO TABS: 1.0000 mg | ORAL_TABLET | ORAL | Status: AC | PRN

## 2020-01-20 MED ORDER — ADULT MULTIVITAMIN W/MINERALS CH
1.0000 | ORAL_TABLET | Freq: Every day | ORAL | Status: DC
  Administered 2020-01-20 – 2020-01-23 (×4): 1 via ORAL
  Filled 2020-01-20 (×4): qty 1

## 2020-01-20 MED ORDER — STROKE: EARLY STAGES OF RECOVERY BOOK
Freq: Once | Status: DC
  Filled 2020-01-20: qty 1

## 2020-01-20 MED ORDER — ACETAMINOPHEN 650 MG RE SUPP: 650.0000 mg | RECTAL | Status: DC | PRN

## 2020-01-20 MED ORDER — LORAZEPAM 2 MG/ML IJ SOLN: 1.0000 mg | INTRAMUSCULAR | Status: AC | PRN

## 2020-01-20 MED ORDER — ASPIRIN EC 81 MG PO TBEC
81.0000 mg | DELAYED_RELEASE_TABLET | Freq: Every day | ORAL | Status: DC
  Administered 2020-01-20 – 2020-01-23 (×4): 81 mg via ORAL
  Filled 2020-01-20 (×4): qty 1

## 2020-01-20 MED ORDER — THIAMINE HCL 100 MG/ML IJ SOLN: 100.0000 mg | Freq: Every day | INTRAMUSCULAR | Status: DC

## 2020-01-20 NOTE — H&P (Signed)
TRH H&P    Patient Demographics:    Rick Williams, is a 54 y.o. male  MRN: [id number]  DOB - 05/27/1966  Admit Date - 01/19/2020  Referring MD/NP/PA: Dr. Roxanne Mins  Outpatient Primary MD for the patient is Patient, No Pcp Per  Patient coming from: Home  Chief complaint-left hand weakness/wrist drop   HPI:    Rick Williams  is a 54 y.o. male, with history of hypertension, CVA, intracranial hemorrhage with intraventricular extension, alcohol abuse, hemothorax presented to ED with complaints of left hand weakness for past 3 days.  Patient says that he slept in a metal chair at work and when he woke up he found that his left hand was weak and he was unable to use it.  Patient said that he had similar weakness last year which resolved after few days.  He denies injury to left arm.  Denies sleeping with left arm hanging from chair. Patient said that he has had blurred vision for past 6 months. Denies slurred speech. Denies weakness of lower extremities He does complain of loss of sensation in the left arm. He denies chest pain or shortness of breath Denies nausea vomiting or diarrhea Denies abdominal pain or dysuria    Review of systems:    In addition to the HPI above,    All other systems reviewed and are negative.    Past History of the following :    Past Medical History:  Diagnosis Date  . Alcohol abuse   . Cerebral hemorrhage ([place]) 2014  . Hemothorax on left   . Hypertension   . Pneumonia   . Stroke [facility]) 2007   no deficits      History reviewed. No pertinent surgical history.    Social History:      Social History   Tobacco Use  . Smoking status: Current Every Day Smoker    Packs/day: 0.50    Types: Cigarettes  . Smokeless tobacco: Never Used  Substance Use Topics  . Alcohol use: Yes    Comment: twice a week        Family History :   No family history of cancer   Home Medications:   Prior to Admission medications   Medication Sig Start Date End Date Taking? Authorizing Provider  chlordiazePOXIDE (LIBRIUM) 25 MG capsule 77m PO TID x 2D, then 25-561mPO BID X 2D, then 25-5023mO QD X 1D Patient not taking: Reported on 12/19/2018 09/28/18   Mesner, JasCorene CorneaD  cloNIDine (CATAPRES) 0.1 MG tablet Take 2 tablets (0.2 mg total) by mouth 2 (two) times daily. 10/23/19   ZamMilton FergusonD     Allergies:     Allergies  Allergen Reactions  . Benadryl [Diphenhydramine Hcl (Sleep)]      Physical Exam:   Vitals  Blood pressure (!) 153/107, pulse 92, temperature 98.1 F (36.7 C), temperature source Oral, resp. rate 13, height _0  (1.778 m), weight 77.1 kg, SpO2 95 %.  1.  General: Appears in no acute distress  2. Psychiatric: Alert, oriented x3, intact  insight and judgment  3. Neurologic: Cranial nerves II through XII grossly intact, left upper extremity motor strength 3/5, right upper extremity strength 5/5.  Bilateral lower extremity strength 5/5, sensation is reduced in left hand to his wrist.  DTRs 2+ bilaterally  4. HEENMT:  Atraumatic normocephalic, extraocular muscles are intact  5. Respiratory : Clear to auscultation bilaterally  6. Cardiovascular : S1-S2, regular, no murmur auscultated, no edema in the lower extremities  7. Gastrointestinal:  Abdomen is soft, nontender, no organomegaly     Data Review:    CBC Recent Labs  Lab 01/19/20 2230  WBC 5.6  HGB 14.0  HCT 41.0  PLT 148*  MCV 104.3*  MCH 35.6*  MCHC 34.1  RDW 15.1  LYMPHSABS 2.7  MONOABS 0.4  EOSABS 0.2  BASOSABS 0.1   ------------------------------------------------------------------------------------------------------------------  Results for orders placed or performed during the hospital encounter of 01/19/20 (from the past 48 hour(s))  CBC with Differential     Status: Abnormal   Collection Time: 01/19/20 10:30 PM  Result Value Ref Range   WBC 5.6 4.0  - 10.5 K/uL   RBC 3.93 (L) 4.22 - 5.81 MIL/uL   Hemoglobin 14.0 13.0 - 17.0 g/dL   HCT 41.0 39.0 - 52.0 %   MCV 104.3 (H) 80.0 - 100.0 fL   MCH 35.6 (H) 26.0 - 34.0 pg   MCHC 34.1 30.0 - 36.0 g/dL   RDW 15.1 11.5 - 15.5 %   Platelets 148 (L) 150 - 400 K/uL   nRBC 0.4 (H) 0.0 - 0.2 %   Neutrophils Relative % 39 %   Neutro Abs 2.2 1.7 - 7.7 K/uL   Lymphocytes Relative 49 %   Lymphs Abs 2.7 0.7 - 4.0 K/uL   Monocytes Relative 8 %   Monocytes Absolute 0.4 0.1 - 1.0 K/uL   Eosinophils Relative 3 %   Eosinophils Absolute 0.2 0.0 - 0.5 K/uL   Basophils Relative 1 %   Basophils Absolute 0.1 0.0 - 0.1 K/uL   Immature Granulocytes 0 %   Abs Immature Granulocytes 0.01 0.00 - 0.07 K/uL    Comment: Performed at [facility], [address], [place], [place] [address]  Comprehensive metabolic panel     Status: Abnormal   Collection Time: 01/19/20 11:07 PM  Result Value Ref Range   Sodium 137 135 - 145 mmol/L   Potassium 3.5 3.5 - 5.1 mmol/L   Chloride 102 98 - 111 mmol/L   CO2 24 22 - 32 mmol/L   Glucose, Bld 97 70 - 99 mg/dL    Comment: Glucose reference range applies only to samples taken after fasting for at least 8 hours.   BUN 8 6 - 20 mg/dL   Creatinine, Ser 0.89 0.61 - 1.24 mg/dL   Calcium 8.2 (L) 8.9 - 10.3 mg/dL   Total Protein 8.1 6.5 - 8.1 g/dL   Albumin 3.4 (L) 3.5 - 5.0 g/dL   AST 49 (H) 15 - 41 U/L   ALT 15 0 - 44 U/L   Alkaline Phosphatase 68 38 - 126 U/L   Total Bilirubin 0.8 0.3 - 1.2 mg/dL   GFR calc non Af Amer >60 >60 mL/min   GFR calc Af Amer >60 >60 mL/min   Anion gap 11 5 - 15    Comment: Performed at [facility], [address], [place], [place] [address]  Urine rapid drug screen (hosp performed)     Status: None   Collection Time: 01/19/20 11:19 PM  Result Value  Ref Range   Opiates NONE DETECTED NONE DETECTED   Cocaine NONE DETECTED NONE DETECTED   Benzodiazepines NONE DETECTED NONE DETECTED   Amphetamines NONE DETECTED NONE DETECTED    Tetrahydrocannabinol NONE DETECTED NONE DETECTED   Barbiturates NONE DETECTED NONE DETECTED    Comment: (NOTE) DRUG SCREEN FOR MEDICAL PURPOSES ONLY.  IF CONFIRMATION IS NEEDED FOR ANY PURPOSE, NOTIFY LAB WITHIN 5 DAYS. LOWEST DETECTABLE LIMITS FOR URINE DRUG SCREEN Drug Class                     Cutoff (ng/mL) Amphetamine and metabolites    1000 Barbiturate and metabolites    200 Benzodiazepine                 702 Tricyclics and metabolites     300 Opiates and metabolites        300 Cocaine and metabolites        300 THC                            50 Performed at [facility], [address], [place], [place] [address]   Urinalysis, Routine w reflex microscopic     Status: Abnormal   Collection Time: 01/19/20 11:19 PM  Result Value Ref Range   Color, Urine COLORLESS (A) YELLOW   APPearance CLEAR CLEAR   Specific Gravity, Urine 1.000 (L) 1.005 - 1.030   pH 6.0 5.0 - 8.0   Glucose, UA NEGATIVE NEGATIVE mg/dL   Hgb urine dipstick NEGATIVE NEGATIVE   Bilirubin Urine NEGATIVE NEGATIVE   Ketones, ur NEGATIVE NEGATIVE mg/dL   Protein, ur NEGATIVE NEGATIVE mg/dL   Nitrite NEGATIVE NEGATIVE   Leukocytes,Ua NEGATIVE NEGATIVE    Comment: Performed at [facility], [address] [place], [place] [address]  Respiratory Panel by RT PCR (Flu A&B, Covid) - Nasopharyngeal Swab     Status: None   Collection Time: 01/19/20 11:20 PM   Specimen: Nasopharyngeal Swab  Result Value Ref Range   SARS Coronavirus 2 by RT PCR NEGATIVE NEGATIVE    Comment: (NOTE) SARS-CoV-2 target nucleic acids are NOT DETECTED. The SARS-CoV-2 RNA is generally detectable in upper respiratoy specimens during the acute phase of infection. The lowest concentration of SARS-CoV-2 viral copies this assay can detect is 131 copies/mL. A negative result does not preclude SARS-Cov-2 infection and should not be used as the sole basis for treatment or other patient management decisions. A negative result may occur with    improper specimen collection/handling, submission of specimen other than nasopharyngeal swab, presence of viral mutation(s) within the areas targeted by this assay, and inadequate number of viral copies (<131 copies/mL). A negative result must be combined with clinical observations, patient history, and epidemiological information. The expected result is Negative. Fact Sheet for Patients:  PinkCheek.be Fact Sheet for Healthcare Providers:  GravelBags.it This test is not yet ap proved or cleared by the [place] FDA and  has been authorized for detection and/or diagnosis of SARS-CoV-2 by FDA under an Emergency Use Authorization (EUA). This EUA will remain  in effect (meaning this test can be used) for the duration of the COVID-19 declaration under Section 564(b)(1) of the Act, 21 U.S.C. section 360bbb-3(b)(1), unless the authorization is terminated or revoked sooner.    Influenza A by PCR NEGATIVE NEGATIVE   Influenza B by PCR NEGATIVE NEGATIVE    Comment: (NOTE) The Xpert Xpress SARS-CoV-2/FLU/RSV assay is intended as an aid in  the diagnosis of influenza from Nasopharyngeal swab specimens and  should not be used as a sole basis for treatment. Nasal washings and  aspirates are unacceptable for Xpert Xpress SARS-CoV-2/FLU/RSV  testing. Fact Sheet for Patients: PinkCheek.be Fact Sheet for Healthcare Providers: GravelBags.it This test is not yet approved or cleared by the [place] FDA and  has been authorized for detection and/or diagnosis of SARS-CoV-2 by  FDA under an Emergency Use Authorization (EUA). This EUA will remain  in effect (meaning this test can be used) for the duration of the  Covid-19 declaration under Section 564(b)(1) of the Act, 21  U.S.C. section 360bbb-3(b)(1), unless the authorization is  terminated or revoked. Performed at [facility], [address], [place], [place] [address]   Ethanol     Status: Abnormal   Collection Time: 01/19/20 11:27 PM  Result Value Ref Range   Alcohol, Ethyl (B) 260 (H) <10 mg/dL    Comment: (NOTE) Lowest detectable limit for serum alcohol is 10 mg/dL. For medical purposes only. Performed at [facility], [address], [place], [place] [address]   Protime-INR     Status: None   Collection Time: 01/19/20 11:27 PM  Result Value Ref Range   Prothrombin Time 13.8 11.4 - 15.2 seconds   INR 1.1 0.8 - 1.2    Comment: (NOTE) INR goal varies based on device and disease states. Performed at [facility], [address], [place], [place] [address]   APTT     Status: None   Collection Time: 01/19/20 11:27 PM  Result Value Ref Range   aPTT 35 24 - 36 seconds    Comment: Performed at [facility], [address] [place], [place] [address]  CBG monitoring, ED     Status: None   Collection Time: 01/19/20 11:47 PM  Result Value Ref Range   Glucose-Capillary 92 70 - 99 mg/dL    Comment: Glucose reference range applies only to samples taken after fasting for at least 8 hours.    Chemistries  Recent Labs  Lab 01/19/20 2307  NA 137  K 3.5  CL 102  CO2 24  GLUCOSE 97  BUN 8  CREATININE 0.89  CALCIUM 8.2*  AST 49*  ALT 15  ALKPHOS 68  BILITOT 0.8   ------------------------------------------------------------------------------------------------------------------  ------------------------------------------------------------------------------------------------------------------ GFR: Estimated Creatinine Clearance: 99.1 mL/min (by C-G formula based on SCr of 0.89 mg/dL). Liver Function Tests: Recent Labs  Lab 01/19/20 2307  AST 49*  ALT 15  ALKPHOS 68  BILITOT 0.8  PROT 8.1  ALBUMIN 3.4*   No results for input(s): LIPASE, AMYLASE in the last 168 hours. No results for input(s): AMMONIA in the last 168 hours. Coagulation Profile: Recent Labs  Lab 01/19/20 2327  INR 1.1    Cardiac Enzymes: No results for input(s): CKTOTAL, CKMB, CKMBINDEX, TROPONINI in the last 168 hours. BNP (last 3 results) No results for input(s): PROBNP in the last 8760 hours. HbA1C: No results for input(s): HGBA1C in the last 72 hours. CBG: Recent Labs  Lab 01/19/20 2347  GLUCAP 92   Lipid Profile: No results for input(s): CHOL, HDL, LDLCALC, TRIG, CHOLHDL, LDLDIRECT in the last 72 hours. Thyroid Function Tests: No results for input(s): TSH, T4TOTAL, FREET4, T3FREE, THYROIDAB in the last 72 hours. Anemia Panel: No results for input(s): VITAMINB12, FOLATE, FERRITIN, TIBC, IRON, RETICCTPCT in the last 72 hours.  --------------------------------------------------------------------------------------------------------------- Urine analysis:    Component Value Date/Time   COLORURINE COLORLESS (A) 01/19/2020 2319   APPEARANCEUR CLEAR 01/19/2020 2319  LABSPEC 1.000 (L) 01/19/2020 2319   PHURINE 6.0 01/19/2020 2319   GLUCOSEU NEGATIVE 01/19/2020 2319   HGBUR NEGATIVE 01/19/2020 2319   BILIRUBINUR NEGATIVE 01/19/2020 2319   KETONESUR NEGATIVE 01/19/2020 2319   PROTEINUR NEGATIVE 01/19/2020 2319   UROBILINOGEN 0.2 02/08/2013 1605   NITRITE NEGATIVE 01/19/2020 2319   LEUKOCYTESUR NEGATIVE 01/19/2020 2319      Imaging Results:    CT ANGIO HEAD W OR WO CONTRAST  Result Date: 01/20/2020 CLINICAL DATA:  Left hand weakness EXAM: CT ANGIOGRAPHY HEAD AND NECK TECHNIQUE: Multidetector CT imaging of the head and neck was performed using the standard protocol during bolus administration of intravenous contrast. Multiplanar CT image reconstructions and MIPs were obtained to evaluate the vascular anatomy. Carotid stenosis measurements (when applicable) are obtained utilizing NASCET criteria, using the distal internal carotid diameter as the denominator. CONTRAST:  36m OMNIPAQUE IOHEXOL 350 MG/ML SOLN COMPARISON:  None. FINDINGS: CT HEAD FINDINGS Brain: There is no mass, hemorrhage or  extra-axial collection. There is generalized atrophy without lobar predilection. There is an old right subinsular infarct. There is hypoattenuation of the periventricular white matter, most commonly indicating chronic ischemic microangiopathy. Skull: The visualized skull base, calvarium and extracranial soft tissues are normal. Sinuses/Orbits: No fluid levels or advanced mucosal thickening of the visualized paranasal sinuses. No mastoid or middle ear effusion. The orbits are normal. CTA NECK FINDINGS SKELETON: There is no bony spinal canal stenosis. No lytic or blastic lesion. OTHER NECK: Normal pharynx, larynx and major salivary glands. No cervical lymphadenopathy. Unremarkable thyroid gland. UPPER CHEST: Emphysema AORTIC ARCH: There is no calcific atherosclerosis of the aortic arch. There is no aneurysm, dissection or hemodynamically significant stenosis of the visualized portion of the aorta. Conventional 3 vessel aortic branching pattern. The visualized proximal subclavian arteries are widely patent. RIGHT CAROTID SYSTEM: No dissection, occlusion or aneurysm. There is mixed density atherosclerosis extending into the proximal ICA, resulting in less than 50% stenosis. LEFT CAROTID SYSTEM: No dissection, occlusion or aneurysm. There is mixed density atherosclerosis extending into the proximal ICA, resulting in less than 50% stenosis. VERTEBRAL ARTERIES: Left dominant configuration. Both origins are clearly patent. There is no dissection, occlusion or flow-limiting stenosis to the skull base (V1-V3 segments). CTA HEAD FINDINGS POSTERIOR CIRCULATION: --Vertebral arteries: Normal V4 segments. --Posterior inferior cerebellar arteries (PICA): Patent origins from the vertebral arteries. --Anterior inferior cerebellar arteries (AICA): Patent origins from the basilar artery. --Basilar artery: Diminutive --Superior cerebellar arteries: Normal. --Posterior cerebral arteries: Normal. Both are predominantly supplied by the  posterior communicating arteries (p-comm). ANTERIOR CIRCULATION: --Intracranial internal carotid arteries: Normal. --Anterior cerebral arteries (ACA): Normal. Both A1 segments are present. Patent anterior communicating artery (a-comm). --Middle cerebral arteries (MCA): Normal. VENOUS SINUSES: As permitted by contrast timing, patent. ANATOMIC VARIANTS: Fetal origins of both posterior cerebral arteries. Review of the MIP images confirms the above findings. IMPRESSION: 1. No intracranial arterial occlusion or high-grade stenosis. 2. Bilateral mixed density atherosclerosis of the proximal internal carotid arteries without hemodynamically significant stenosis by NASCET criteria. 3. Chronic small vessel ischemia. 4. Emphysema (ICD10-J43.9). Electronically Signed   By: KUlyses JarredM.D.   On: 01/20/2020 00:27   CT Head Wo Contrast  Result Date: 01/19/2020 CLINICAL DATA:  Unable to move left hand since yesterday EXAM: CT HEAD WITHOUT CONTRAST TECHNIQUE: Contiguous axial images were obtained from the base of the skull through the vertex without intravenous contrast. COMPARISON:  MRI 10/23/2019 FINDINGS: Brain: Stable region of gliosis in the right insula/external capsule. New hypoattenuating focus in the left  corona radiata may correspond to an evolving lacunar infarct seen on comparison MRI. Previously seen thalamic diffusion restriction is without CT correlate. No convincing CT evidence of vascular territory or cortically based acute infarction. No acute hemorrhage, hydrocephalus, extra-axial collection or mass lesion/mass effect. Symmetric prominence of the cisterns and sulci compatible with parenchymal volume loss. Asymmetric dilatation of the right lateral ventricle likely related to ex vacuo dilatation. Patchy areas of white matter hypoattenuation are most compatible with chronic microvascular angiopathy. Vascular: Atherosclerotic calcification of the carotid siphons. No hyperdense vessel. Skull: No calvarial  fracture or suspicious osseous lesion. No scalp swelling or hematoma. Sinuses/Orbits: Paranasal sinuses and mastoid air cells are predominantly clear. Included orbital structures are unremarkable. Other: None IMPRESSION: 1. No convincing CT evidence of acute vascular territory or cortically based infarction. If there is persisting clinical concern, MRI could be obtained. 2. Tiny hypoattenuating focus in the left corona radiata may correspond to evolution of a lacunar infarct seen on comparison MRI. Previously seen thalamic diffusion restriction is without CT correlate. Stable region of gliosis in the right insula/external capsule. 3. Stable parenchymal volume loss and chronic microvascular angiopathy. Electronically Signed   By: Lovena Le M.D.   On: 01/19/2020 23:12   CT ANGIO NECK W OR WO CONTRAST  Result Date: 01/20/2020 CLINICAL DATA:  Left hand weakness EXAM: CT ANGIOGRAPHY HEAD AND NECK TECHNIQUE: Multidetector CT imaging of the head and neck was performed using the standard protocol during bolus administration of intravenous contrast. Multiplanar CT image reconstructions and MIPs were obtained to evaluate the vascular anatomy. Carotid stenosis measurements (when applicable) are obtained utilizing NASCET criteria, using the distal internal carotid diameter as the denominator. CONTRAST:  66m OMNIPAQUE IOHEXOL 350 MG/ML SOLN COMPARISON:  None. FINDINGS: CT HEAD FINDINGS Brain: There is no mass, hemorrhage or extra-axial collection. There is generalized atrophy without lobar predilection. There is an old right subinsular infarct. There is hypoattenuation of the periventricular white matter, most commonly indicating chronic ischemic microangiopathy. Skull: The visualized skull base, calvarium and extracranial soft tissues are normal. Sinuses/Orbits: No fluid levels or advanced mucosal thickening of the visualized paranasal sinuses. No mastoid or middle ear effusion. The orbits are normal. CTA NECK FINDINGS  SKELETON: There is no bony spinal canal stenosis. No lytic or blastic lesion. OTHER NECK: Normal pharynx, larynx and major salivary glands. No cervical lymphadenopathy. Unremarkable thyroid gland. UPPER CHEST: Emphysema AORTIC ARCH: There is no calcific atherosclerosis of the aortic arch. There is no aneurysm, dissection or hemodynamically significant stenosis of the visualized portion of the aorta. Conventional 3 vessel aortic branching pattern. The visualized proximal subclavian arteries are widely patent. RIGHT CAROTID SYSTEM: No dissection, occlusion or aneurysm. There is mixed density atherosclerosis extending into the proximal ICA, resulting in less than 50% stenosis. LEFT CAROTID SYSTEM: No dissection, occlusion or aneurysm. There is mixed density atherosclerosis extending into the proximal ICA, resulting in less than 50% stenosis. VERTEBRAL ARTERIES: Left dominant configuration. Both origins are clearly patent. There is no dissection, occlusion or flow-limiting stenosis to the skull base (V1-V3 segments). CTA HEAD FINDINGS POSTERIOR CIRCULATION: --Vertebral arteries: Normal V4 segments. --Posterior inferior cerebellar arteries (PICA): Patent origins from the vertebral arteries. --Anterior inferior cerebellar arteries (AICA): Patent origins from the basilar artery. --Basilar artery: Diminutive --Superior cerebellar arteries: Normal. --Posterior cerebral arteries: Normal. Both are predominantly supplied by the posterior communicating arteries (p-comm). ANTERIOR CIRCULATION: --Intracranial internal carotid arteries: Normal. --Anterior cerebral arteries (ACA): Normal. Both A1 segments are present. Patent anterior communicating artery (a-comm). --Middle cerebral arteries (  MCA): Normal. VENOUS SINUSES: As permitted by contrast timing, patent. ANATOMIC VARIANTS: Fetal origins of both posterior cerebral arteries. Review of the MIP images confirms the above findings. IMPRESSION: 1. No intracranial arterial occlusion  or high-grade stenosis. 2. Bilateral mixed density atherosclerosis of the proximal internal carotid arteries without hemodynamically significant stenosis by NASCET criteria. 3. Chronic small vessel ischemia. 4. Emphysema (ICD10-J43.9). Electronically Signed   By: Ulyses Jarred M.D.   On: 01/20/2020 00:27    My personal review of EKG: Rhythm NSR, no ST-T changes   Assessment & Plan:    Active Problems:   Left arm weakness   1. Left arm weakness-concerning for brachial plexus injury, Erb palsy versus stroke.  Patient was evaluated by tele neurology, MRI brain and MRI cervical spine were recommended.  Will obtain both studies at [facility] lipid profile, hemoglobin A1c, ESR, CRP.  Start aspirin 81 mg daily, atorvastatin 40 mg p.o. daily. 2. Alcohol abuse-patient's alcohol level elevated at 260.  Will start CIWA protocol.  Also start thiamine and folate. 3. Hypertension-patient has history of intracranial hemorrhage.  Restart home medication including Catapres 0.2 mg p.o. twice daily.  Goal to keep SBP less than 160 as per tele neurology.    DVT Prophylaxis-   Lovenox   AM Labs Ordered, also please review Full Orders  Family Communication: Admission, patients condition and plan of care including tests being ordered have been discussed with the patient who indicate understanding and agree with the plan and Code Status.  Code Status: Full code  Admission status: Observation  Time spent in minutes : 60 minutes   Nehemias Sauceda S Lyall Faciane M.D

## 2020-01-20 NOTE — Consult Note (Addendum)
[facility] Tele[facility]urology Consult Services  Stat Consult  Date of Service:   01/19/2020 22:20:13  Impression:     .  I63.9 - Cerebrovascular accident (CVA), unspecified mechanism ([place])  Comments/Sign-Out: [name] is a 54 y.o. 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 Exam a this time show 0/5 wrist extension, wrist flexion weakness, grip weakness, and weakness of the interossei as well (cannot spread his fingers). There is also some slight biceps and triceps weakness in the LUE. He reports complete numbness in his left hand in all parts (no peripheral nerve or dermatomal distribution). CT head shows no acute intracranial process. CTA head and neck shows b/l calcifications at the proximal ICAs without flow limiting stenosis and no large vessel occlusion. The basilar artery appears hypoplastic throughout its course and stenotic at its tip, but there are robust b/l fetal variant PCA arteries b/l on my interpretation of the images. His MRA Head from before also showed his basilar was diminutive, irregular, and terminated in the SCAs. At this time his symptoms most likely represent a process affecting the peripheral nerves. Saturday night palsy comes to mind (radial nerve compression), but he denies any inciting compression of the nerve and there is also weakness in other nerve distributions given the slight biceps weakness, the grip weakness, and interossei weakness. A polyneuropathy or poly radiculopathy would be a consideration. Stroke is less likely, but also possible given his symptoms. He is not an Alteplase candidate as his LKW is >4.5 hours from presentation. At this time I recommend admission for further evaluation and treatment of his symptoms. I  spoke to Dr. Roxanne Mins of the ER about my assessment and recommendations who reported understanding.  Dr. Roxanne Mins states patient will be transferred to sister hospital with in-patient Neurology as they do not have MRI here on the weekend.  I said this is reasonable.    CT HEAD: Showed No Acute Hemorrhage or Acute Core Infarct  Metrics: [facility] Notification Time: 01/19/2020 22:19:00 Stamp Time: 01/19/2020 22:20:13 Callback Response Time: 01/19/2020 22:21:22  Our recommendations are outlined below.  Recommendations:     .  Recommend Cardiac telemetry     .  Recommend euglycemia and euthermia     .  Recommend q4h neuro-checks     .  Recommend MRI Aaron Edelman W/WO and MRI C-spine W/WO to evaluate for infarct or radiculopathy     .  Recommend systolic goal <903 for now with gradual goal of normotension over the next few weeks, will need close follow up of BP as out-patient given history of ICH and ischemic stroke     .  Recommend Aspirin 41m PO daily for now     .  Recommend Atorvastatin 434mPO daily for now, titrate to a goal LDL <70     .  Recommend TTE with bubble     .  Recommend CIWA while in-patient     .  Recommend checking A1c, LDL, ESR, CRP, TSH, Vitamin B12, MMA, and Folate level     .  Recommend routine Ophthalmology consultation regarding his vision loss     .  Recommend smoking cessation     .  If no central cause of left hand symptoms is found on imaging then routine EMG/[place] would be indicated     .  Alert Neurology with any neuro-worsening     .  If no local Neurology consultation available, or patient not transferred, for follow up of these tests once obtained, then please place repeat STAT consult to Neurology at [facility] for further recommendations and evaluation   Imaging Studies:     .  MRI Head Without Contrast     .  Echocardiogram - Transthoracic Echocardiogram  Therapies:     .  Physical Therapy     .  Occupational Therapy  Other WorkUp:     .  Check labs  as above   Disposition: Neurology Follow Up Recommended  Sign Out:     .  Discussed with Emergency Department Provider  ----------------------------------------------------------------------------------------------------  Chief Complaint: left hand weakness/numbness  History of Present Illness: Patient is a 54 year old Male.  [name] is a 54 y.o. 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 He reports that he was fine yesterday at 1400 while at the farmers [place] for work. He then took at nap and awoke at 1500 at which time he noted that his left hand had was weak with numbness/tingling. He reports falling asleep with his arms crossed and did not sleep on his left hand in any odd way. There is no recent trauma to his left shoulder or under his arm. Since that time his left hand has been weak and numb. There have not been any other new symptoms. He reports similar symptoms with his previous stroke in 2014, but notes that his whole left side was involved at that time. He denies any vertigo, nausea, headache, or numbness/tingling in any other extremity. He reports no changes in vision, but reports severely impaired vision over the last 6 months. There is no known history of glaucoma. He reports his vision is poor to the point he can see objects and people moving, but not much else. He cannot even make me out on the screen. He reports that he takes no medications, but notes he "probably should". He drinks about two pints of wine a week. He denies any illicit drugs other than marijuana and currently smokes tobacco. In reviewing his chart he presented to the ER in February of 2021 with the complaint of diffuse weakness and with concern that he was having another  stroke. He did have an MRI Brain at that time that showed an acute infarct in the left thalamus and a sub-acute infarct in the left corona radiata. Carotid dopplers at that time were ok, but it does not appear he had any other work-up. He did mention visual problems at that time as well. 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    Past Medical History:     . Hypertension     . Stroke     . There is NO history of Diabetes Mellitus     . There is NO history of Hyperlipidemia     . There is NO history of Atrial Fibrillation     . There is NO history of Coronary Artery Disease  Anticoagulant use:  No  Antiplatelet use: No     Examination: BP(159/105), Pulse(90), Blood Glucose(92) 1A: Level of Consciousness - Alert; keenly responsive + 0  1B: Ask Month and Age - Both Questions Right + 0 1C: Blink Eyes & Squeeze Hands - Performs Both Tasks + 0 2: Test Horizontal Extraocular Movements - Normal + 0 3: Test Visual Fields - No Visual Loss + 0 4: Test Facial Palsy (Use Grimace if Obtunded) - Normal symmetry + 0 5A: Test Left Arm Motor Drift - No Drift for 10 Seconds + 0 5B: Test Right Arm Motor Drift - No Drift for 10 Seconds + 0 6A: Test Left Leg Motor Drift - No Drift for 5 Seconds + 0 6B: Test Right Leg Motor Drift - No Drift for 5 Seconds + 0 7: Test Limb Ataxia (FNF/Heel-Shin) - No Ataxia + 0 8: Test Sensation - Normal; No sensory loss + 0 9: Test Language/Aphasia - Normal; No aphasia + 0 10: Test Dysarthria - Normal + 0 11: Test Extinction/Inattention - No abnormality + 0  NIHSS Score: 0   Mental status is intact.  Patient is A&Ox3.   There is no receptive or expressive aphasia.      EOMI without nystagmus.  PERL.  Cannot motion with right eye.  With left eye can only count fingers in central vision. There is no facial weakness.   No dysarthria.   Tongue midline. Sensation intact in V1-V3 to  light touch b/l.     Easily anti-gravity in all 4 extremities.  There is no pronator drift. RUE grip is 3/5, wrist extenors 0/5, wrist flexors 3/5, biceps 4+/5, and triceps 4+/5.  LUE is 5/5 throughout.  b/l 5/5 at the hip flexors, plantarflexors, and dorsiflexors.    Sensation absent in his whole left hand to his wrist.     No dysmetria on FNF b/l.   No dysmetria on heel to shin b/l.     Patient/Family was informed the Neurology Consult would occur via TeleHealth consult by way of interactive audio and video telecommunications and consented to receiving care in this manner.    Dr Kathee Delton   [facility] [phone]  Case [id number]  Addendum:  I followed up the final CTA head and neck reports and impressions as noted below.  No LVO or significant flow limiting stenosis.  Does have emphysematous changes and would recommend smoking cessation as noted before.  Recommendations as above.    Kathee Delton, MD   CTA head and neck 01/19/2020 IMPRESSION: 1. No intracranial arterial occlusion or high-grade stenosis. 2. Bilateral mixed density atherosclerosis of the proximal internal carotid arteries without hemodynamically significant stenosis by NASCET criteria. 3. Chronic small vessel ischemia. 4. Emphysema (ICD10-J43.9).

## 2020-01-20 NOTE — ED Notes (Signed)
One silver colored necklace was laying in pt's room after pt was transferred, necklace placed in biohazard bag and placed in lockers in er with pt's label on both, message left at [phone] with location of necklace

## 2020-01-20 NOTE — Progress Notes (Signed)
*  PRELIMINARY RESULTS* Echocardiogram 2D Echocardiogram with bubble study has been performed.  Jeryl Columbia 01/20/2020, 9:13 AM

## 2020-01-20 NOTE — ED Notes (Signed)
Silver color necklace left in ED . C-RN left VM on pt's phone . Will notify receiving RN

## 2020-01-20 NOTE — Progress Notes (Signed)
Orthopedic Tech Progress Note Patient Details:  Rick Williams 10-03-1965 [id number] Let secretary know to tell nurse who was busy at the time that patient has on thumb spica but patient had IV on left wrist. Ortho Devices Type of Ortho Device: Thumb velcro splint Ortho Device/Splint Location: LUE Ortho Device/Splint Interventions: Ordered, Application, Adjustment   Post Interventions Patient Tolerated: Well Instructions Provided: Adjustment of device, Care of device   Shanequa Whitenight 01/20/2020, 8:44 PM

## 2020-01-20 NOTE — ED Provider Notes (Signed)
Care assumed from Dr. Jacqulyn Bath, patient with right wrist drop and numbness in the right hand with exam not completely consistent with radial nerve palsy and not completely consistent with stroke.  At time of care transfer, CT angiogram of head and neck were pending.  The show no acute process not, no occlusions.  Case was discussed with the teleneurologist who feels patient should be admitted for MRI of head and cervical spine both with and without contrast.  Case is discussed with Dr. Sharl Ma of [facility], who agrees to admit the patient.  Results for orders placed or performed during the hospital encounter of 01/19/20  CBC with Differential  Result Value Ref Range   WBC 5.6 4.0 - 10.5 K/uL   RBC 3.93 (L) 4.22 - 5.81 MIL/uL   Hemoglobin 14.0 13.0 - 17.0 g/dL   HCT 89.2 11.9 - 41.7 %   MCV 104.3 (H) 80.0 - 100.0 fL   MCH 35.6 (H) 26.0 - 34.0 pg   MCHC 34.1 30.0 - 36.0 g/dL   RDW 40.8 14.4 - 81.8 %   Platelets 148 (L) 150 - 400 K/uL   nRBC 0.4 (H) 0.0 - 0.2 %   Neutrophils Relative % 39 %   Neutro Abs 2.2 1.7 - 7.7 K/uL   Lymphocytes Relative 49 %   Lymphs Abs 2.7 0.7 - 4.0 K/uL   Monocytes Relative 8 %   Monocytes Absolute 0.4 0.1 - 1.0 K/uL   Eosinophils Relative 3 %   Eosinophils Absolute 0.2 0.0 - 0.5 K/uL   Basophils Relative 1 %   Basophils Absolute 0.1 0.0 - 0.1 K/uL   Immature Granulocytes 0 %   Abs Immature Granulocytes 0.01 0.00 - 0.07 K/uL  Comprehensive metabolic panel  Result Value Ref Range   Sodium 137 135 - 145 mmol/L   Potassium 3.5 3.5 - 5.1 mmol/L   Chloride 102 98 - 111 mmol/L   CO2 24 22 - 32 mmol/L   Glucose, Bld 97 70 - 99 mg/dL   BUN 8 6 - 20 mg/dL   Creatinine, Ser 5.63 0.61 - 1.24 mg/dL   Calcium 8.2 (L) 8.9 - 10.3 mg/dL   Total Protein 8.1 6.5 - 8.1 g/dL   Albumin 3.4 (L) 3.5 - 5.0 g/dL   AST 49 (H) 15 - 41 U/L   ALT 15 0 - 44 U/L   Alkaline Phosphatase 68 38 - 126 U/L   Total Bilirubin 0.8 0.3 - 1.2 mg/dL   GFR calc non Af Amer >60 >60 mL/min   GFR calc Af Amer >60 >60 mL/min   Anion gap 11 5 - 15  Ethanol  Result Value Ref Range   Alcohol, Ethyl (B) 260 (H) <10 mg/dL  Protime-INR  Result Value Ref Range   Prothrombin Time 13.8 11.4 - 15.2 seconds   INR 1.1 0.8 - 1.2  APTT  Result Value Ref Range   aPTT 35 24 - 36 seconds  Urine rapid drug screen (hosp performed)  Result Value Ref Range   Opiates NONE DETECTED NONE DETECTED   Cocaine NONE DETECTED NONE DETECTED   Benzodiazepines NONE DETECTED NONE DETECTED   Amphetamines NONE DETECTED NONE DETECTED   Tetrahydrocannabinol NONE DETECTED NONE DETECTED   Barbiturates NONE DETECTED NONE DETECTED  Urinalysis, Routine w reflex microscopic  Result Value Ref Range   Color, Urine COLORLESS (A) YELLOW   APPearance CLEAR CLEAR   Specific Gravity, Urine 1.000 (L) 1.005 - 1.030   pH 6.0 5.0 - 8.0  Glucose, UA NEGATIVE NEGATIVE mg/dL   Hgb urine dipstick NEGATIVE NEGATIVE   Bilirubin Urine NEGATIVE NEGATIVE   Ketones, ur NEGATIVE NEGATIVE mg/dL   Protein, ur NEGATIVE NEGATIVE mg/dL   Nitrite NEGATIVE NEGATIVE   Leukocytes,Ua NEGATIVE NEGATIVE  CBG monitoring, ED  Result Value Ref Range   Glucose-Capillary 92 70 - 99 mg/dL   CT ANGIO HEAD W OR WO CONTRAST  Result Date: 01/20/2020 CLINICAL DATA:  Left hand weakness EXAM: CT ANGIOGRAPHY HEAD AND NECK TECHNIQUE: Multidetector CT imaging of the head and neck was performed using the standard protocol during bolus administration of intravenous contrast. Multiplanar CT image reconstructions and MIPs were obtained to evaluate the vascular anatomy. Carotid stenosis measurements (when applicable) are obtained utilizing NASCET criteria, using the distal internal carotid diameter as the denominator. CONTRAST:  54mL OMNIPAQUE IOHEXOL 350 MG/ML SOLN COMPARISON:  None. FINDINGS: CT HEAD FINDINGS Brain: There is no mass, hemorrhage or extra-axial collection. There is generalized atrophy without lobar predilection. There is an old right subinsular  infarct. There is hypoattenuation of the periventricular white matter, most commonly indicating chronic ischemic microangiopathy. Skull: The visualized skull base, calvarium and extracranial soft tissues are normal. Sinuses/Orbits: No fluid levels or advanced mucosal thickening of the visualized paranasal sinuses. No mastoid or middle ear effusion. The orbits are normal. CTA NECK FINDINGS SKELETON: There is no bony spinal canal stenosis. No lytic or blastic lesion. OTHER NECK: Normal pharynx, larynx and major salivary glands. No cervical lymphadenopathy. Unremarkable thyroid gland. UPPER CHEST: Emphysema AORTIC ARCH: There is no calcific atherosclerosis of the aortic arch. There is no aneurysm, dissection or hemodynamically significant stenosis of the visualized portion of the aorta. Conventional 3 vessel aortic branching pattern. The visualized proximal subclavian arteries are widely patent. RIGHT CAROTID SYSTEM: No dissection, occlusion or aneurysm. There is mixed density atherosclerosis extending into the proximal ICA, resulting in less than 50% stenosis. LEFT CAROTID SYSTEM: No dissection, occlusion or aneurysm. There is mixed density atherosclerosis extending into the proximal ICA, resulting in less than 50% stenosis. VERTEBRAL ARTERIES: Left dominant configuration. Both origins are clearly patent. There is no dissection, occlusion or flow-limiting stenosis to the skull base (V1-V3 segments). CTA HEAD FINDINGS POSTERIOR CIRCULATION: --Vertebral arteries: Normal V4 segments. --Posterior inferior cerebellar arteries (PICA): Patent origins from the vertebral arteries. --Anterior inferior cerebellar arteries (AICA): Patent origins from the basilar artery. --Basilar artery: Diminutive --Superior cerebellar arteries: Normal. --Posterior cerebral arteries: Normal. Both are predominantly supplied by the posterior communicating arteries (p-comm). ANTERIOR CIRCULATION: --Intracranial internal carotid arteries: Normal.  --Anterior cerebral arteries (ACA): Normal. Both A1 segments are present. Patent anterior communicating artery (a-comm). --Middle cerebral arteries (MCA): Normal. VENOUS SINUSES: As permitted by contrast timing, patent. ANATOMIC VARIANTS: Fetal origins of both posterior cerebral arteries. Review of the MIP images confirms the above findings. IMPRESSION: 1. No intracranial arterial occlusion or high-grade stenosis. 2. Bilateral mixed density atherosclerosis of the proximal internal carotid arteries without hemodynamically significant stenosis by NASCET criteria. 3. Chronic small vessel ischemia. 4. Emphysema (ICD10-J43.9). Electronically Signed   By: Ulyses Jarred M.D.   On: 01/20/2020 00:27   CT Head Wo Contrast  Result Date: 01/19/2020 CLINICAL DATA:  Unable to move left hand since yesterday EXAM: CT HEAD WITHOUT CONTRAST TECHNIQUE: Contiguous axial images were obtained from the base of the skull through the vertex without intravenous contrast. COMPARISON:  MRI 10/23/2019 FINDINGS: Brain: Stable region of gliosis in the right insula/external capsule. New hypoattenuating focus in the left corona radiata may correspond to an evolving lacunar  infarct seen on comparison MRI. Previously seen thalamic diffusion restriction is without CT correlate. No convincing CT evidence of vascular territory or cortically based acute infarction. No acute hemorrhage, hydrocephalus, extra-axial collection or mass lesion/mass effect. Symmetric prominence of the cisterns and sulci compatible with parenchymal volume loss. Asymmetric dilatation of the right lateral ventricle likely related to ex vacuo dilatation. Patchy areas of white matter hypoattenuation are most compatible with chronic microvascular angiopathy. Vascular: Atherosclerotic calcification of the carotid siphons. No hyperdense vessel. Skull: No calvarial fracture or suspicious osseous lesion. No scalp swelling or hematoma. Sinuses/Orbits: Paranasal sinuses and mastoid air  cells are predominantly clear. Included orbital structures are unremarkable. Other: None IMPRESSION: 1. No convincing CT evidence of acute vascular territory or cortically based infarction. If there is persisting clinical concern, MRI could be obtained. 2. Tiny hypoattenuating focus in the left corona radiata may correspond to evolution of a lacunar infarct seen on comparison MRI. Previously seen thalamic diffusion restriction is without CT correlate. Stable region of gliosis in the right insula/external capsule. 3. Stable parenchymal volume loss and chronic microvascular angiopathy. Electronically Signed   By: Kreg Shropshire M.D.   On: 01/19/2020 23:12   CT ANGIO NECK W OR WO CONTRAST  Result Date: 01/20/2020 CLINICAL DATA:  Left hand weakness EXAM: CT ANGIOGRAPHY HEAD AND NECK TECHNIQUE: Multidetector CT imaging of the head and neck was performed using the standard protocol during bolus administration of intravenous contrast. Multiplanar CT image reconstructions and MIPs were obtained to evaluate the vascular anatomy. Carotid stenosis measurements (when applicable) are obtained utilizing NASCET criteria, using the distal internal carotid diameter as the denominator. CONTRAST:  13mL OMNIPAQUE IOHEXOL 350 MG/ML SOLN COMPARISON:  None. FINDINGS: CT HEAD FINDINGS Brain: There is no mass, hemorrhage or extra-axial collection. There is generalized atrophy without lobar predilection. There is an old right subinsular infarct. There is hypoattenuation of the periventricular white matter, most commonly indicating chronic ischemic microangiopathy. Skull: The visualized skull base, calvarium and extracranial soft tissues are normal. Sinuses/Orbits: No fluid levels or advanced mucosal thickening of the visualized paranasal sinuses. No mastoid or middle ear effusion. The orbits are normal. CTA NECK FINDINGS SKELETON: There is no bony spinal canal stenosis. No lytic or blastic lesion. OTHER NECK: Normal pharynx, larynx and  major salivary glands. No cervical lymphadenopathy. Unremarkable thyroid gland. UPPER CHEST: Emphysema AORTIC ARCH: There is no calcific atherosclerosis of the aortic arch. There is no aneurysm, dissection or hemodynamically significant stenosis of the visualized portion of the aorta. Conventional 3 vessel aortic branching pattern. The visualized proximal subclavian arteries are widely patent. RIGHT CAROTID SYSTEM: No dissection, occlusion or aneurysm. There is mixed density atherosclerosis extending into the proximal ICA, resulting in less than 50% stenosis. LEFT CAROTID SYSTEM: No dissection, occlusion or aneurysm. There is mixed density atherosclerosis extending into the proximal ICA, resulting in less than 50% stenosis. VERTEBRAL ARTERIES: Left dominant configuration. Both origins are clearly patent. There is no dissection, occlusion or flow-limiting stenosis to the skull base (V1-V3 segments). CTA HEAD FINDINGS POSTERIOR CIRCULATION: --Vertebral arteries: Normal V4 segments. --Posterior inferior cerebellar arteries (PICA): Patent origins from the vertebral arteries. --Anterior inferior cerebellar arteries (AICA): Patent origins from the basilar artery. --Basilar artery: Diminutive --Superior cerebellar arteries: Normal. --Posterior cerebral arteries: Normal. Both are predominantly supplied by the posterior communicating arteries (p-comm). ANTERIOR CIRCULATION: --Intracranial internal carotid arteries: Normal. --Anterior cerebral arteries (ACA): Normal. Both A1 segments are present. Patent anterior communicating artery (a-comm). --Middle cerebral arteries (MCA): Normal. VENOUS SINUSES: As permitted by contrast  timing, patent. ANATOMIC VARIANTS: Fetal origins of both posterior cerebral arteries. Review of the MIP images confirms the above findings. IMPRESSION: 1. No intracranial arterial occlusion or high-grade stenosis. 2. Bilateral mixed density atherosclerosis of the proximal internal carotid arteries without  hemodynamically significant stenosis by NASCET criteria. 3. Chronic small vessel ischemia. 4. Emphysema (ICD10-J43.9). Electronically Signed   By: Deatra Robinson M.D.   On: 01/20/2020 00:27     Dione Booze, MD 01/20/20 [phone]

## 2020-01-20 NOTE — Progress Notes (Signed)
ASSUMPTION OF CARE NOTE   01/20/2020 8:05 AM  Rick Williams was seen and examined.  The H&P by the admitting provider, orders, imaging was reviewed.  He has a pronounced left wrist drop.  His BPs are poorly controlled.  He reports history of brain aneurysm.  Plan is to transfer to [facility] for MRI and neurology consult.  Thumb spica splint ordered fro left wrist.  IV prn BP meds ordered.  CIWA protocol in place.  Please see new orders.  Will continue to follow while waiting for transfer.   Vitals:   01/20/20 0630 01/20/20 0707  BP: (!) 149/101   Pulse: 88 74  Resp: 18 20  Temp:    SpO2:  97%    Results for orders placed or performed during the hospital encounter of 01/19/20  Respiratory Panel by RT PCR (Flu A&B, Covid) - Nasopharyngeal Swab   Specimen: Nasopharyngeal Swab  Result Value Ref Range   SARS Coronavirus 2 by RT PCR NEGATIVE NEGATIVE   Influenza A by PCR NEGATIVE NEGATIVE   Influenza B by PCR NEGATIVE NEGATIVE  CBC with Differential  Result Value Ref Range   WBC 5.6 4.0 - 10.5 K/uL   RBC 3.93 (L) 4.22 - 5.81 MIL/uL   Hemoglobin 14.0 13.0 - 17.0 g/dL   HCT 41.0 39.0 - 52.0 %   MCV 104.3 (H) 80.0 - 100.0 fL   MCH 35.6 (H) 26.0 - 34.0 pg   MCHC 34.1 30.0 - 36.0 g/dL   RDW 15.1 11.5 - 15.5 %   Platelets 148 (L) 150 - 400 K/uL   nRBC 0.4 (H) 0.0 - 0.2 %   Neutrophils Relative % 39 %   Neutro Abs 2.2 1.7 - 7.7 K/uL   Lymphocytes Relative 49 %   Lymphs Abs 2.7 0.7 - 4.0 K/uL   Monocytes Relative 8 %   Monocytes Absolute 0.4 0.1 - 1.0 K/uL   Eosinophils Relative 3 %   Eosinophils Absolute 0.2 0.0 - 0.5 K/uL   Basophils Relative 1 %   Basophils Absolute 0.1 0.0 - 0.1 K/uL   Immature Granulocytes 0 %   Abs Immature Granulocytes 0.01 0.00 - 0.07 K/uL  Comprehensive metabolic panel  Result Value Ref Range   Sodium 137 135 - 145 mmol/L   Potassium 3.5 3.5 - 5.1 mmol/L   Chloride 102 98 - 111 mmol/L   CO2 24 22 - 32 mmol/L   Glucose, Bld 97 70 - 99 mg/dL   BUN 8 6 - 20  mg/dL   Creatinine, Ser 0.89 0.61 - 1.24 mg/dL   Calcium 8.2 (L) 8.9 - 10.3 mg/dL   Total Protein 8.1 6.5 - 8.1 g/dL   Albumin 3.4 (L) 3.5 - 5.0 g/dL   AST 49 (H) 15 - 41 U/L   ALT 15 0 - 44 U/L   Alkaline Phosphatase 68 38 - 126 U/L   Total Bilirubin 0.8 0.3 - 1.2 mg/dL   GFR calc non Af Amer >60 >60 mL/min   GFR calc Af Amer >60 >60 mL/min   Anion gap 11 5 - 15  Ethanol  Result Value Ref Range   Alcohol, Ethyl (B) 260 (H) <10 mg/dL  Protime-INR  Result Value Ref Range   Prothrombin Time 13.8 11.4 - 15.2 seconds   INR 1.1 0.8 - 1.2  APTT  Result Value Ref Range   aPTT 35 24 - 36 seconds  Urine rapid drug screen (hosp performed)  Result Value Ref Range   Opiates NONE  DETECTED NONE DETECTED   Cocaine NONE DETECTED NONE DETECTED   Benzodiazepines NONE DETECTED NONE DETECTED   Amphetamines NONE DETECTED NONE DETECTED   Tetrahydrocannabinol NONE DETECTED NONE DETECTED   Barbiturates NONE DETECTED NONE DETECTED  Urinalysis, Routine w reflex microscopic  Result Value Ref Range   Color, Urine COLORLESS (A) YELLOW   APPearance CLEAR CLEAR   Specific Gravity, Urine 1.000 (L) 1.005 - 1.030   pH 6.0 5.0 - 8.0   Glucose, UA NEGATIVE NEGATIVE mg/dL   Hgb urine dipstick NEGATIVE NEGATIVE   Bilirubin Urine NEGATIVE NEGATIVE   Ketones, ur NEGATIVE NEGATIVE mg/dL   Protein, ur NEGATIVE NEGATIVE mg/dL   Nitrite NEGATIVE NEGATIVE   Leukocytes,Ua NEGATIVE NEGATIVE  Comprehensive metabolic panel  Result Value Ref Range   Sodium 138 135 - 145 mmol/L   Potassium 3.9 3.5 - 5.1 mmol/L   Chloride 105 98 - 111 mmol/L   CO2 23 22 - 32 mmol/L   Glucose, Bld 71 70 - 99 mg/dL   BUN 8 6 - 20 mg/dL   Creatinine, Ser 1.95 0.61 - 1.24 mg/dL   Calcium 8.1 (L) 8.9 - 10.3 mg/dL   Total Protein 7.8 6.5 - 8.1 g/dL   Albumin 3.3 (L) 3.5 - 5.0 g/dL   AST 49 (H) 15 - 41 U/L   ALT 15 0 - 44 U/L   Alkaline Phosphatase 67 38 - 126 U/L   Total Bilirubin 0.9 0.3 - 1.2 mg/dL   GFR calc non Af Amer >60 >60  mL/min   GFR calc Af Amer >60 >60 mL/min   Anion gap 10 5 - 15  Magnesium  Result Value Ref Range   Magnesium 1.4 (L) 1.7 - 2.4 mg/dL  Phosphorus  Result Value Ref Range   Phosphorus 3.6 2.5 - 4.6 mg/dL  CBC  Result Value Ref Range   WBC 8.0 4.0 - 10.5 K/uL   RBC 4.07 (L) 4.22 - 5.81 MIL/uL   Hemoglobin 14.1 13.0 - 17.0 g/dL   HCT 09.3 26.7 - 12.4 %   MCV 102.9 (H) 80.0 - 100.0 fL   MCH 34.6 (H) 26.0 - 34.0 pg   MCHC 33.7 30.0 - 36.0 g/dL   RDW 58.0 99.8 - 33.8 %   Platelets 145 (L) 150 - 400 K/uL   nRBC 0.3 (H) 0.0 - 0.2 %  Sedimentation rate  Result Value Ref Range   Sed Rate 33 (H) 0 - 16 mm/hr  C-reactive protein  Result Value Ref Range   CRP 0.5 <1.0 mg/dL  TSH  Result Value Ref Range   TSH 4.716 (H) 0.350 - 4.500 uIU/mL  Vitamin B12  Result Value Ref Range   Vitamin B-12 277 180 - 914 pg/mL  CBG monitoring, ED  Result Value Ref Range   Glucose-Capillary 92 70 - 99 mg/dL   Tiime Spent: 32 minutes   Maryln Manuel, MD [facility]   01/19/2020  9:49 PM How to contact the [facility] Attending or Consulting provider 7A - 7P or covering provider during after hours 7P -7A, for this patient?  1. Check the care team in [facility] and look for a) attending/consulting [facility] provider listed and b) the [facility] team listed 2. Log into [email or web address] and use [place]'s universal password to access. If you do not have the password, please contact the hospital operator. 3. Locate the [facility] provider you are looking for under [facility] and page to a number that you can be directly reached. 4. If you still have  difficulty reaching the provider, please page the [facility] (Director on Call) for the Hospitalists listed on amion for assistance.

## 2020-01-20 NOTE — ED Notes (Signed)
Spoke with Ruby at [facility] to set up transportation at this time.  

## 2020-01-21 ENCOUNTER — Inpatient Hospital Stay (HOSPITAL_COMMUNITY): Payer: Self-pay

## 2020-01-21 ENCOUNTER — Encounter (HOSPITAL_COMMUNITY): Payer: Self-pay | Admitting: Family Medicine

## 2020-01-21 DIAGNOSIS — F10920 Alcohol use, unspecified with intoxication, uncomplicated: Secondary | ICD-10-CM

## 2020-01-21 DIAGNOSIS — H547 Unspecified visual loss: Secondary | ICD-10-CM

## 2020-01-21 DIAGNOSIS — R29898 Other symptoms and signs involving the musculoskeletal system: Secondary | ICD-10-CM

## 2020-01-21 DIAGNOSIS — G540 Brachial plexus disorders: Secondary | ICD-10-CM

## 2020-01-21 DIAGNOSIS — M21332 Wrist drop, left wrist: Principal | ICD-10-CM

## 2020-01-21 DIAGNOSIS — F1010 Alcohol abuse, uncomplicated: Secondary | ICD-10-CM

## 2020-01-21 DIAGNOSIS — R7401 Elevation of levels of liver transaminase levels: Secondary | ICD-10-CM

## 2020-01-21 DIAGNOSIS — I10 Essential (primary) hypertension: Secondary | ICD-10-CM

## 2020-01-21 LAB — GLUCOSE, CAPILLARY
Glucose-Capillary: 86 mg/dL (ref 70–99)
Glucose-Capillary: 122 mg/dL — ABNORMAL HIGH (ref 70–99)

## 2020-01-21 MED ORDER — GADOBUTROL 1 MMOL/ML IV SOLN
7.5000 mL | Freq: Once | INTRAVENOUS | Status: AC | PRN
  Administered 2020-01-21: 7.5 mL via INTRAVENOUS

## 2020-01-21 MED ORDER — VITAMIN D (ERGOCALCIFEROL) 1.25 MG (50000 UNIT) PO CAPS
50000.0000 [IU] | ORAL_CAPSULE | ORAL | Status: DC
  Filled 2020-01-21: qty 1

## 2020-01-21 MED ORDER — GADOBUTROL 1 MMOL/ML IV SOLN
7.0000 mL | Freq: Once | INTRAVENOUS | Status: AC | PRN
  Administered 2020-01-21: 7 mL via INTRAVENOUS

## 2020-01-21 NOTE — Evaluation (Signed)
Occupational Therapy Evaluation Patient Details Name: Rick Williams MRN: [id number] DOB: 01-01-1966 Today's Date: 01/21/2020    History of Present Illness Pt is a 54 yo male presenting with numbness and weakness of L wrist. PMH includes: HTN, CVAs, R basal ganglia ICH, and alcohol abuse.   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  He has struggled with poor vision x 6 months. He also reports having had a similar episode such as this with wrist drop once before from which he gradually recovered. Pt is overall functioning at a min assist level in ADL and mobility. He presents with L UE weakness in elbow extensors distal with impaired sensation from mid forearm through digits. Will follow acutely.    Follow Up Recommendations  Outpatient OT    Equipment Recommendations  None recommended by OT    Recommendations for Other Services       Precautions / Restrictions Precautions Precautions: Fall Precaution Comments: desat to mid 18s on RA, low vision Restrictions Weight Bearing Restrictions: No      Mobility Bed Mobility Overal bed mobility: Modified Independent                Transfers Overall transfer level: Needs assistance Equipment used: None Transfers: Sit to/from Stand Sit to Stand: Supervision         General transfer comment: supervision for lines and due to low vision    Balance Overall balance assessment: Needs assistance Sitting-balance support: No upper extremity supported;Feet supported Sitting balance-Leahy Scale: Good     Standing balance support: Single extremity supported Standing balance-Leahy Scale: Fair                             ADL either performed or assessed with clinical judgement   ADL Overall ADL's : Needs assistance/impaired Eating/Feeding: Minimal assistance;Sitting Eating/Feeding Details (indicate cue type and reason): assist to  open containers, typically self feeds with L hand, but now using R Grooming: Oral care;Standing;Minimal assistance Grooming Details (indicate cue type and reason): assist to set up Upper Body Bathing: Minimal assistance;Sitting   Lower Body Bathing: Minimal assistance;Sit to/from stand   Upper Body Dressing : Minimal assistance;Sitting   Lower Body Dressing: Minimal assistance;Sit to/from stand   Toilet Transfer: Minimal assistance;Ambulation   Toileting- Clothing Manipulation and Hygiene: Minimal assistance;Sit to/from stand       Functional mobility during ADLs: Minimal assistance General ADL Comments: Educated in positioning R UE to avoid overstretching wrist extensors     Vision Patient Visual Report: Blurring of vision;Peripheral vision impairment Additional Comments: pt reports poor visual acuity and impaired peripheral vision x 6 months     Perception     Praxis      Pertinent Vitals/Pain Pain Assessment: No/denies pain     Hand Dominance ("Both")   Extremity/Trunk Assessment Upper Extremity Assessment Upper Extremity Assessment: LUE deficits/detail LUE Deficits / Details: shoulder and elbow flexors 5/5, 4-/5 elbow extensior, 3/5 forearm, 0/5 wrist and can move fingers slightly LUE Sensation: decreased light touch(from 3 inches proximal to wrist through hand) LUE Coordination: decreased fine motor   Lower Extremity Assessment Lower Extremity Assessment: Defer to PT evaluation   Cervical / Trunk Assessment Cervical / Trunk Assessment: Normal   Communication Communication Communication: No difficulties   Cognition Arousal/Alertness: Awake/alert Behavior During Therapy: WFL for tasks assessed/performed Overall Cognitive Status: Within Functional Limits for tasks assessed  General Comments: pt pleasant and agreeable, good insight to importance for safety   General Comments       Exercises     Shoulder  Instructions      Home Living Family/patient expects to be discharged to:: Private residence Living Arrangements: Alone Available Help at Discharge: Family;Friend(s);Available PRN/intermittently Type of Home: House Home Access: Stairs to enter [employer] of Steps: 2 Entrance Stairs-Rails: Right Home Layout: One level     Bathroom Shower/Tub: Chief Strategy Officer: Standard     Home Equipment: Shower seat;Grab bars - tub/shower          Prior Functioning/Environment Level of Independence: Independent        Comments: pt reports his friend helps him cross the street due to low vision, 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        OT Problem List: Decreased strength;Impaired balance (sitting and/or standing);Impaired vision/perception;Decreased knowledge of use of DME or AE;Impaired UE functional use      OT Treatment/Interventions: Self-care/ADL training;Neuromuscular education;Patient/family education;Balance training;Therapeutic activities;Visual/perceptual remediation/compensation    OT Goals(Current goals can be found in the care plan section) Acute Rehab OT Goals Patient Stated Goal: return home OT Goal Formulation: With patient Time For Goal Achievement: 02/04/20 Potential to Achieve Goals: Good ADL Goals Pt Will Perform Eating: with modified independence;sitting Pt Will Perform Grooming: with modified independence;standing Pt Will Perform Upper Body Dressing: with modified independence;sitting Pt Will Perform Lower Body Dressing: with modified independence;sit to/from stand Pt Will Transfer to Toilet: with modified independence;ambulating Pt Will Perform Toileting - Clothing Manipulation and hygiene: with modified independence;sit to/from stand Pt/caregiver will Perform Home Exercise Program: Increased strength;Increased ROM;Left upper extremity;Independently Additional ADL Goal #1: Pt will be knowledgeable in use of L  wrist splint/positioning to avoid injury and deformity.  OT Frequency: Min 2X/week   Barriers to D/C:            Co-evaluation              AM-PAC OT "6 Clicks" Daily Activity     Outcome Measure Help from another person eating meals?: A Little Help from another person taking care of personal grooming?: A Little Help from another person toileting, which includes using toliet, bedpan, or urinal?: A Little Help from another person bathing (including washing, rinsing, drying)?: A Little Help from another person to put on and taking off regular upper body clothing?: A Little Help from another person to put on and taking off regular lower body clothing?: A Little 6 Click Score: 18   End of Session Equipment Utilized During Treatment: Gait belt  Activity Tolerance: Patient tolerated treatment well Patient left: in bed;with call bell/phone within reach;with family/visitor present  OT Visit Diagnosis: Other abnormalities of gait and mobility (R26.89);Muscle weakness (generalized) (M62.81);Low vision, both eyes (H54.2)                Time: 1301-1330 OT Time Calculation (min): 29 min Charges:  OT General Charges $OT Visit: 1 Visit OT Evaluation $OT Eval Moderate Complexity: 1 Mod OT Treatments $Neuromuscular Re-education: 8-22 mins  Martie Round, OTR/L Acute Rehabilitation Services Pager: [phone] Office: [phone]  Evern Bio 01/21/2020, 2:38 PM

## 2020-01-21 NOTE — Progress Notes (Signed)
Pt on room air o2 sats 98%. After walking to bathroom and returning to bed, pt oxygen at 84%.  RN placed 1L [place] on patient and oxygen slowly returned to 90s.

## 2020-01-21 NOTE — Consult Note (Addendum)
Neurology Consultation  Reason for Consult: Left wrist drop Referring Physician: Tyrone Nine, MD  CC: Left wrist drop  History is obtained from: Patient  HPI: Rick Williams is a 54 y.o. male with history of stroke in 2007 and cerebral hemorrhage in 2014.  Also with a history of alcohol abuse and hypertension.  Neurology was consulted for patient's c/c of new onset left upper extremity weakness and numbness, predominantly involving the hand and wrist. The wrist weakness is significant enough to produce a clinical picture suggestive of wrist drop.  In talking to the patient, he states that this last Friday he was at the [employer] with no issues at that time.  He got in his car with his friend.  He was trying to wave to another friend when he noticed that his left wrist was unable to move along with weakness in his left arm more proximally.  Patient states that he has no feeling in his left hand up to his wrist, with severe left hand weakness. He also has weakness of elbow flexion/extension and with lifting of his left arm at the shoulder.    Although documentation in the chart suggests a possible radial nerve pressure palsy from falling asleep in a metal chair, the patient states that at no time did he have his arm hanging over a chair, while awake or sleeping.  He also denies any trauma to his left shoulder or upper arm.  He has had a stroke in the past which left him with left arm and leg weakness; however, he states that he has fully recovered from this.  He does admit to drinking alcohol but says he does not drink a significant amount.  Work up that has been done: -MRI of brain -MRI cervical spine -CT angio of head and neck -CT head  LKW: 01/25/2020 tpa given?: no, out of window Premorbid modified Rankin scale (mRS): 0 NIH stroke score of 2   Past Medical History:  Diagnosis Date  . Alcohol abuse   . Cerebral hemorrhage (HCC) 2014  . Hemothorax on left   .  Hypertension   . Pneumonia   . Stroke [facility]) 2007   no deficits     Family History  Problem Relation Age of Onset  . Hypertension Mother   . Hypertension Father    Social History:   reports that he has been smoking cigarettes. He has been smoking about 0.50 packs per day. He has never used smokeless tobacco. He reports current alcohol use. He reports current drug use. Drug: Marijuana.  Medications  Current Facility-Administered Medications:  .   stroke: mapping our early stages of recovery book, , Does not apply, Once, [place], Gagan S, MD .  0.9 %  sodium chloride infusion, , Intravenous, Continuous, Sharl Ma, Sarina Ill, MD, Last Rate: 10 mL/hr at 01/20/20 0339, New Bag at 01/20/20 0339 .  acetaminophen (TYLENOL) tablet 650 mg, 650 mg, Oral, Q4H PRN **OR** acetaminophen (TYLENOL) 160 MG/5ML solution 650 mg, 650 mg, Per Tube, Q4H PRN **OR** acetaminophen (TYLENOL) suppository 650 mg, 650 mg, Rectal, Q4H PRN, Sharl Ma, Sarina Ill, MD .  aspirin EC tablet 81 mg, 81 mg, Oral, Daily, Meredeth Ide, MD, 81 mg at 01/21/20 9702 .  atorvastatin (LIPITOR) tablet 40 mg, 40 mg, Oral, Daily, Meredeth Ide, MD, 40 mg at 01/21/20 6378 .  cloNIDine (CATAPRES) tablet 0.2 mg, 0.2 mg, Oral, BID, Sharl Ma, Sarina Ill, MD, 0.2 mg at 01/21/20 5885 .  enoxaparin (LOVENOX) injection 40  mg, 40 mg, Subcutaneous, Daily, Oswald Hillock, MD, 40 mg at 01/21/20 9892 .  folic acid (FOLVITE) tablet 1 mg, 1 mg, Oral, Daily, Darrick Meigs, Marge Duncans, MD, 1 mg at 01/21/20 1194 .  hydrALAZINE (APRESOLINE) tablet 25 mg, 25 mg, Oral, Q6H PRN, Darrick Meigs, Marge Duncans, MD .  LORazepam (ATIVAN) tablet 1-4 mg, 1-4 mg, Oral, Q1H PRN **OR** LORazepam (ATIVAN) injection 1-4 mg, 1-4 mg, Intravenous, Q1H PRN, Darrick Meigs, Marge Duncans, MD .  metoprolol tartrate (LOPRESSOR) injection 5 mg, 5 mg, Intravenous, Q6H PRN, Johnson, Clanford L, MD .  multivitamin with minerals tablet 1 tablet, 1 tablet, Oral, Daily, Oswald Hillock, MD, 1 tablet at 01/21/20 [phone] .  ondansetron (ZOFRAN) injection 4  mg, 4 mg, Intravenous, Q6H PRN, Johnson, Clanford L, MD, 4 mg at 01/20/20 1359 .  pantoprazole (PROTONIX) injection 40 mg, 40 mg, Intravenous, Q24H, Johnson, Clanford L, MD, 40 mg at 01/20/20 1400 .  senna-docusate (Senokot-S) tablet 1 tablet, 1 tablet, Oral, QHS PRN, Oswald Hillock, MD .  thiamine tablet 100 mg, 100 mg, Oral, Daily, 100 mg at 01/21/20 8144 **OR** thiamine (B-1) injection 100 mg, 100 mg, Intravenous, Daily, Darrick Meigs, Marge Duncans, MD  ROS: General ROS: negative for - chills, fatigue, fever, night sweats, weight gain or weight loss Psychological ROS: negative for - behavioral disorder, hallucinations, memory difficulties, mood swings or suicidal ideation Ophthalmic ROS: Positive for -decreased vision bilaterally ENT ROS: negative for - epistaxis, nasal discharge, oral lesions, sore throat, tinnitus or vertigo Allergy and Immunology ROS: negative for - hives or itchy/watery eyes Hematological and Lymphatic ROS: negative for - bleeding problems, bruising or swollen lymph nodes Endocrine ROS: negative for - galactorrhea, hair pattern changes, polydipsia/polyuria or temperature intolerance Respiratory ROS: negative for - cough, hemoptysis, shortness of breath or wheezing Cardiovascular ROS: negative for - chest pain, dyspnea on exertion, edema or irregular heartbeat Gastrointestinal ROS: negative for - abdominal pain, diarrhea, hematemesis, nausea/vomiting or stool incontinence Genito-Urinary ROS: negative for - dysuria, hematuria, incontinence or urinary frequency/urgency Musculoskeletal ROS: Positive for - muscular weakness Neurological ROS: as noted in HPI Dermatological ROS: negative for rash and skin lesion changes  Exam: Current vital signs: BP (!) 137/99 (BP Location: Right Arm)   Pulse 66   Temp 98.1 F (36.7 C) (Oral)   Resp 20   Ht 5\' 10"  (1.778 m)   Wt 70.4 kg   SpO2 97%   BMI 22.27 kg/m  Vital signs in last 24 hours: Temp:  [98.1 F (36.7 C)-98.6 F (37 C)] 98.1 F  (36.7 C) (05/03 0827) Pulse Rate:  [66-78] 66 (05/03 0827) Resp:  [16-20] 20 (05/03 0827) BP: (132-176)/(84-106) 137/99 (05/03 0827) SpO2:  [97 %-100 %] 97 % (05/03 0827) Weight:  [70.4 kg] 70.4 kg (05/02 2027)   Constitutional: Appears well-developed and well-nourished.  Psych: Affect appropriate to situation Eyes: No scleral injection HENT: No OP obstrucion Head: Normocephalic.  Cardiovascular: Normal rate and regular rhythm.  Respiratory: Effort normal, non-labored breathing GI: Soft.  No distension. There is no tenderness.  Skin: WDI  Neuro: Mental Status: Patient is awake, alert, oriented to person, place, month, year, and situation. Speech-no aphasia or dysarthria. Naming, repeating and comprehension intact. Patient is able to give a clear and coherent history. Cranial Nerves: II: Visual Fields are full.  However, he has significantly decreased visual acuity, having difficulty counting fingers with each eye. PERRL.  III,IV, VI: EOMI without ptosis or diplopia.  V: Facial sensation is symmetric to temperature VII: Facial movement is  symmetric.  VIII: hearing is intact to voice X: Palate elevates symmetrically XI: Shoulder shrug is symmetric. XII: Tongue is midline without atrophy or fasciculations.  Motor: Right arm and leg have 5/5 strength.  Left leg with 5/5 strength. Left shoulder abduction 4/5, left bicep flexion 4/5, left tricep extension 4/5. 1/5 left wrist extension, finger abduction, finger flexion and finger extension. Wrist flexion 2/5. Sensory:  Left dorsal and palmar regions of hand with complete sensory loss to temperature and FT, up to the wrist.  Otherwise sensation is symmetric to light touch and temperature in the arms and legs. Deep Tendon Reflexes: 2+ and symmetric in the biceps and patellae.  Plantars: Toes are downgoing bilaterally.  Cerebellar: FNF on the left has dysmetria secondary to strength, on the right within normal limits. HKS intact  bilaterally  Labs I have reviewed labs in epic and the results pertinent to this consultation are:   CBC    Component Value Date/Time   WBC 8.0 01/20/2020 0449   RBC 4.07 (L) 01/20/2020 0449   HGB 14.1 01/20/2020 0449   HCT 41.9 01/20/2020 0449   PLT 145 (L) 01/20/2020 0449   MCV 102.9 (H) 01/20/2020 0449   MCH 34.6 (H) 01/20/2020 0449   MCHC 33.7 01/20/2020 0449   RDW 15.0 01/20/2020 0449   LYMPHSABS 2.7 01/19/2020 2230   MONOABS 0.4 01/19/2020 2230   EOSABS 0.2 01/19/2020 2230   BASOSABS 0.1 01/19/2020 2230    CMP     Component Value Date/Time   NA 138 01/20/2020 0449   K 3.9 01/20/2020 0449   CL 105 01/20/2020 0449   CO2 23 01/20/2020 0449   GLUCOSE 71 01/20/2020 0449   BUN 8 01/20/2020 0449   CREATININE 0.86 01/20/2020 0449   CALCIUM 8.1 (L) 01/20/2020 0449   PROT 7.8 01/20/2020 0449   ALBUMIN 3.3 (L) 01/20/2020 0449   AST 49 (H) 01/20/2020 0449   ALT 15 01/20/2020 0449   ALKPHOS 67 01/20/2020 0449   BILITOT 0.9 01/20/2020 0449   GFRNONAA >60 01/20/2020 0449   GFRAA >60 01/20/2020 0449    Lipid Panel     Component Value Date/Time   CHOL 206 (H) 01/20/2020 0450   TRIG 143 01/20/2020 0450   HDL 85 01/20/2020 0450   CHOLHDL 2.4 01/20/2020 0450   VLDL 29 01/20/2020 0450   LDLCALC 92 01/20/2020 0450     Imaging I have reviewed the images obtained:  CT-scan of the brain: 1. No convincing CT evidence of acute vascular territory or cortically based infarction. 2. Tiny hypoattenuating focus in the left corona radiata may correspond to evolution of a lacunar infarct seen on comparison MRI. Previously seen thalamic diffusion restriction is without CT correlate. Stable region of gliosis in the right insula/external capsule. 3. Stable parenchymal volume loss and chronic microvascular angiopathy..  CT angio head and neck-no intracranial arterial occlusion or high-grade stenosis.  Bilateral mixed density atherosclerosis of the proximal internal carotid arteries  without hemodynamically significant stenosis.  MRI examination of the brain-unchanged distribution of white matter lesions in the pattern which may be consistent with multiple sclerosis.  No demyelinating lesions of the cervical spine.  Unchanged appearance of the cervical degeneration discs with mild spinal canal stenosis at C3-C6.  Felicie Morn PA-C [facility] [phone] 01/21/2020, 10:16 AM     Assessment: 54 year old male presenting to the ED with a 4-day history of left hand numbness in conjunction with weakness of his left wrist and fingers.   1. On exam, he  remains weak in his left upper extremity with a proximal-distal gradient, the deficit being most severe in his hand and wrist.  At this time the weakness and numbness do not follow any specific peripheral nerve distribution, but a lower brachial plexopathy from a mass or inflammation is possible.  2. MRI is negative for acute stroke. There is a medium-sized area of chronic hemosiderin deposition in the region of the right basal ganglia, consistent with prior ICH. Of note, this lesion is contralateral to the side of his acute weakness. Official Radiology report describes possible demyelinating lesions in the cerebral white matter, unchanged since the prior scan, but on personal review of the images, they appear more likely to be due to chronic small vessel disease.  3. His cervical spine MRI shows no lesions suggestive of spinal cord demyelination.  4. Of note, the patient describes a transient event about 4 months ago during which he had weakness in a distribution identical to that with which he currently presents. 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  5. MRI-negative stroke is also a differential diagnostic consideration.  6. Several weeks' history of binocular visual impairment. Given no occipital lobe lesion on MRI, his visual symptoms and decreased  visual acuity on exam best localize to the globes. This should be further evaluated by Ophthalmology.    Recommendations: -Transthoracic Echo  - Continue ASA   - BP goal: Given that symptoms started > 24 hours ago, the patient's systolic blood pressure goal should be less than 140 -Telemetry monitoring - Frequent neuro checks - NPO until passes stroke swallow screen - PT/OT - MRI of LEFT brachial plexus with and without contrast -- Also will need CT of left lung apex and left brachial plexus to assess for possible Pancoast tumor - While in hospital he should have a dilated retinal exam and tonoscopy by Ophthalmology as he has significant impairment of visual acuity without visual field cut.  - May need outpatient EMG/nerve conduction study if MRI brachial plexus and CT of left lung apex are unrevealing.  -- EEG (ordered)  # Please page stroke NP  Or  PA  Or MD from 8am -4 pm  as this patient from this time will be  followed by the stroke.   You can look them up on [email or web address]  Password [email or web address]  I have seen and examined the patient. I have formulated the assessment and recommendations. 54 year old male presenting with LUE weakness. Exam reveals a proximal-distal strength gradient in the LUE. MRI is negative for acute stroke, but does reveal evidence for an old hemorrhage in the region of the right basal ganglia. DDx is broad, as outlined above. Recommendations include left brachial plexus imaging.  Electronically signed: Dr. Caryl Pina

## 2020-01-21 NOTE — Evaluation (Signed)
Physical Therapy Evaluation Patient Details Name: Rick Williams MRN: [id number] DOB: 30-Sep-1965 Today's Date: 01/21/2020   History of Present Illness  Pt is a 54 yo male presenting with numbness and weakness of L wrist. PMH includes: HTN, CVAs, R basal ganglia ICH, and alcohol abuse.  Clinical Impression  Pt in bed upon arrival of PT, agreeable to evaluation at this time. Prior to admission the pt was independent with mobility and ADLs, reports no residual deficits from prior CVA. The pt now presents with limitations in functional mobility, strength, and dynamic stability due to above dx, and will continue to benefit from skilled PT to address these deficits. The pt was able to ambulate 120 ft in hallway with only minA through [facility] for stability, but desaturated to 86% on RA during ambulation, and required 1L O2 during seated rest to recover to 94%. The pt will continue to benefit from skilled PT to further progress functional endurance, strength, and stabiltiy to improve safety and independence with mobility following d/c.   5X Sit-to-Stand:  24 sec (> 11.4 sec indicates increased risk of falls for individuals aged 60-69, > 15 sec indicates increased risk of recurrent falls)     Follow Up Recommendations No PT follow up;Supervision - Intermittent    Equipment Recommendations  (possibly cane)    Recommendations for Other Services       Precautions / Restrictions Precautions Precautions: Fall Precaution Comments: desat to mid 71s on RA Restrictions Weight Bearing Restrictions: No      Mobility  Bed Mobility Overal bed mobility: Modified Independent                Transfers Overall transfer level: Needs assistance Equipment used: None Transfers: Sit to/from Stand Sit to Stand: Supervision         General transfer comment: S for safety, no LOB, some assist to move tele box/lines  Ambulation/Gait Ambulation/Gait assistance: Min assist Gait Distance (Feet): 120  Feet Assistive device: 1 person hand held assist Gait Pattern/deviations: Step-through pattern;Decreased stride length   Gait velocity interpretation: 1.31 - 2.62 ft/sec, indicative of limited community ambulator General Gait Details: minA through HHA of 1, pt may benefit from trial of cane. Pt with no LOB, but reaching for single UE support  Stairs            Wheelchair Mobility    Modified Rankin (Stroke Patients Only)       Balance Overall balance assessment: Needs assistance Sitting-balance support: No upper extremity supported;Feet supported Sitting balance-Leahy Scale: Good     Standing balance support: Single extremity supported;During functional activity Standing balance-Leahy Scale: Fair                               Pertinent Vitals/Pain Pain Assessment: No/denies pain    Home Living Family/patient expects to be discharged to:: Private residence Living Arrangements: Alone Available Help at Discharge: Family Type of Home: House Home Access: Stairs to enter Entrance Stairs-Rails: Right Entrance Stairs-Number of Steps: 2 Home Layout: One level Home Equipment: Shower seat;Grab bars - tub/shower      Prior Function Level of Independence: Independent         Comments: reports ambulating "with buddies" and using them for physical support despite no use of AD. Pt also reports he had been on home O2 in past but "ran out"     Hand Dominance   Dominant Hand: Right    Extremity/Trunk Assessment  Upper Extremity Assessment Upper Extremity Assessment: LUE deficits/detail;Overall [facility] for tasks assessed LUE Deficits / Details: sig weakness in LUE, most significantly in grip (3-/5) and wrist ext (3-/5) otherwise 4-/5 LUE Coordination: decreased fine motor    Lower Extremity Assessment Lower Extremity Assessment: Overall WFL for tasks assessed    Cervical / Trunk Assessment Cervical / Trunk Assessment: Normal  Communication    Communication: No difficulties  Cognition Arousal/Alertness: Awake/alert Behavior During Therapy: WFL for tasks assessed/performed Overall Cognitive Status: Within Functional Limits for tasks assessed                                 General Comments: pt pleasant and agreeable, good insight to importance for safety      General Comments      Exercises     Assessment/Plan    PT Assessment Patient needs continued PT services  PT Problem List Decreased strength;Decreased mobility;Decreased range of motion;Decreased activity tolerance;Decreased balance;Decreased knowledge of use of DME       PT Treatment Interventions DME instruction;Therapeutic exercise;Gait training;Stair training;Functional mobility training;Therapeutic activities;Patient/family education;Balance training    PT Goals (Current goals can be found in the Care Plan section)  Acute Rehab PT Goals Patient Stated Goal: return home PT Goal Formulation: With patient Time For Goal Achievement: 02/04/20 Potential to Achieve Goals: Good    Frequency Min 3X/week   Barriers to discharge        Co-evaluation               AM-PAC PT "6 Clicks" Mobility  Outcome Measure Help needed turning from your back to your side while in a flat bed without using bedrails?: None Help needed moving from lying on your back to sitting on the side of a flat bed without using bedrails?: None Help needed moving to and from a bed to a chair (including a wheelchair)?: A Little Help needed standing up from a chair using your arms (e.g., wheelchair or bedside chair)?: None Help needed to walk in hospital room?: A Little Help needed climbing 3-5 steps with a railing? : A Little 6 Click Score: 21    End of Session Equipment Utilized During Treatment: Gait belt;Oxygen Activity Tolerance: Patient tolerated treatment well;Patient limited by fatigue Patient left: in chair;with call bell/phone within reach Nurse  Communication: Mobility status(need for O2) PT Visit Diagnosis: Muscle weakness (generalized) (M62.81);Difficulty in walking, not elsewhere classified (R26.2)    Time: 9622-2979 PT Time Calculation (min) (ACUTE ONLY): 30 min   Charges:   PT Evaluation $PT Eval Moderate Complexity: 1 Mod PT Treatments $Gait Training: 8-22 mins        Rick Williams, PT, DPT   Acute Rehabilitation Department Pager #: [phone]   Rick Williams 01/21/2020, 12:16 PM

## 2020-01-21 NOTE — Progress Notes (Signed)
PROGRESS NOTE  Rick Williams  YQM:[id number] DOB: 1966-06-04 DOA: 01/19/2020 PCP: Patient, No Pcp Per   Brief Narrative: Rick Williams is a 54 y.o. male with a history of HTN, alcohol abuse, CVA 2007, cerebral hemorrhage 2014 who presented to [facility] ED with weakness and numbness in the left wrist/hand. He was evaluated by teleneurology who recommended neurology consult and MRI for which the patient was transferred to [facility]. This showed evidence of old right basal ganglia hemorrhage, no acute stroke and is read by radiology as consistent with multiple sclerosis. Neurology is consulted, further work up is planned.  Assessment & Plan: Principal Problem:   Wrist drop, left Active Problems:   History of Hemorrhagic stroke   Essential hypertension, malignant   Left arm weakness   Alcohol intoxication, uncomplicated (HCC)   Elevated AST (SGOT)   Alcohol abuse   Visual impairment   Left wrist drop  Left hand weakness and numbness: Unclear exact Dx. CRP neg. B12 277. HIV NR. - Appreciate neurology evaluation: Obtaining MR brachial plexus, CT left lung apex r/o Pancoast tumor. Tood's paralysis also possible, EEG ordered. Consider outpatient EMG/NCS if above is unrevealing. - Echo pending - Continuing ASA, statin  - PT/OT consulted  Binocular visual impairment, decreased visual acuity: No explanatory lesion on MRI.  - Ophthalmology consulted for evaluation. Spoke with Dr. Allena Katz who recommended close outpatient follow up, advised to call his office at discharge for an appointment this week.  Alcohol abuse: EtOH level elevated at admission to 260.  - Monitor for withdrawal, Tx as needed.  - Vitamin supplementation empirically  HTN:  - Restart clonidine  Vitamin D deficiency:  - Supplement.   Hypomagnesemia:  - Supplement and monitor  Abnormal TSH: 4.716 with normal free T4.  - Recheck at follow up.   Prediabetes:  - Outpatient follow up recommended.   DVT prophylaxis:  Lovenox Code Status: Full Family Communication: None at bedside Disposition Plan:  Status is: Inpatient  Remains inpatient appropriate because:Ongoing diagnostic testing needed not appropriate for outpatient work up   Dispo: The patient is from: Home              Anticipated d/c is to: TBD              Anticipated d/c date is: 2 days              Patient currently is not medically stable to d/c.  Consultants:   Neurology  Ophthalmology  Procedures:   None  Antimicrobials:  None   Subjective: No change in weakness and numbness of left fingers, hand, wrist. No new deficits noted, reports similar episode of this symptom complex months ago which resolved over the course of 2 months.  Objective: Vitals:   01/20/20 2027 01/21/20 0827 01/21/20 1208 01/21/20 1534  BP: (!) 151/98 (!) 137/99  (!) 140/97  Pulse: 78 66 87 69  Resp: Temp: 98.6 F (37 C) 98.1 F (36.7 C)  98 F (36.7 C)  TempSrc: Oral Oral  Oral  SpO2: 100% 97% 95% 100%  Weight: 70.4 kg     Height:        Intake/Output Summary (Last 24 hours) at 01/21/2020 1545 Last data filed at 01/21/2020 0900 Gross per 24 hour  Intake 763.5 ml  Output 400 ml  Net 363.5 ml   Filed Weights   01/19/20 2147 01/20/20 2027  Weight: 77.1 kg 70.4 kg    Gen: 54 y.o. male in no  distress Pulm: Non-labored breathing room air. Clear to auscultation bilaterally.  CV: Regular rate and rhythm. No murmur, rub, or gallop. No JVD, no pedal edema. GI: Abdomen soft, non-tender, non-distended, with normoactive bowel sounds. No organomegaly or masses felt. Ext: Warm, no deformities Skin: No rashes, lesions or ulcers Neuro: Alert and oriented. Weakness in LUE Distal > proximal across multiple distributions with extreme weakness at wrist extension, interossei, grip. Numbness from wrist distally.  Psych: Judgement and insight appear normal. Mood & affect appropriate.   Data Reviewed: I have personally reviewed following labs and  imaging studies  CBC: Recent Labs  Lab 01/19/20 2230 01/20/20 0449  WBC 5.6 8.0  NEUTROABS 2.2  --   HGB 14.0 14.1  HCT 41.0 41.9  MCV 104.3* 102.9*  PLT 148* 145*   Basic Metabolic Panel: Recent Labs  Lab 01/19/20 2307 01/20/20 0449  NA 137 138  K 3.5 3.9  CL 102 105  CO2 24 23  GLUCOSE 97 71  BUN 8 8  CREATININE 0.89 0.86  CALCIUM 8.2* 8.1*  MG  --  1.4*  PHOS  --  3.6   GFR: Estimated Creatinine Clearance: 98.9 mL/min (by C-G formula based on SCr of 0.86 mg/dL). Liver Function Tests: Recent Labs  Lab 01/19/20 2307 01/20/20 0449  AST 49* 49*  ALT 15 15  ALKPHOS 68 67  BILITOT 0.8 0.9  PROT 8.1 7.8  ALBUMIN 3.4* 3.3*   No results for input(s): LIPASE, AMYLASE in the last 168 hours. No results for input(s): AMMONIA in the last 168 hours. Coagulation Profile: Recent Labs  Lab 01/19/20 2327  INR 1.1   Cardiac Enzymes: No results for input(s): CKTOTAL, CKMB, CKMBINDEX, TROPONINI in the last 168 hours. BNP (last 3 results) No results for input(s): PROBNP in the last 8760 hours. HbA1C: Recent Labs    01/20/20 0449  HGBA1C 5.8*   CBG: Recent Labs  Lab 01/19/20 2347 01/21/20 1131  GLUCAP 92 86   Lipid Profile: Recent Labs    01/20/20 0450  CHOL 206*  HDL 85  LDLCALC 92  TRIG 143  CHOLHDL 2.4   Thyroid Function Tests: Recent Labs    01/20/20 0450 01/20/20 0500  TSH 4.716*  --   FREET4  --  0.63   Anemia Panel: Recent Labs    01/20/20 0450  VITAMINB12 277   Urine analysis:    Component Value Date/Time   COLORURINE COLORLESS (A) 01/19/2020 2319   APPEARANCEUR CLEAR 01/19/2020 2319   LABSPEC 1.000 (L) 01/19/2020 2319   PHURINE 6.0 01/19/2020 2319   GLUCOSEU NEGATIVE 01/19/2020 2319   HGBUR NEGATIVE 01/19/2020 2319   BILIRUBINUR NEGATIVE 01/19/2020 2319   KETONESUR NEGATIVE 01/19/2020 2319   PROTEINUR NEGATIVE 01/19/2020 2319   UROBILINOGEN 0.2 02/08/2013 1605   NITRITE NEGATIVE 01/19/2020 2319   LEUKOCYTESUR NEGATIVE  01/19/2020 2319   Recent Results (from the past 240 hour(s))  Respiratory Panel by RT PCR (Flu A&B, Covid) - Nasopharyngeal Swab     Status: None   Collection Time: 01/19/20 11:20 PM   Specimen: Nasopharyngeal Swab  Result Value Ref Range Status   SARS Coronavirus 2 by RT PCR NEGATIVE NEGATIVE Final    Comment: (NOTE) SARS-CoV-2 target nucleic acids are NOT DETECTED. The SARS-CoV-2 RNA is generally detectable in upper respiratoy specimens during the acute phase of infection. The lowest concentration of SARS-CoV-2 viral copies this assay can detect is 131 copies/mL. A negative result does not preclude SARS-Cov-2 infection and should not be used as the  sole basis for treatment or other patient management decisions. A negative result may occur with  improper specimen collection/handling, submission of specimen other than nasopharyngeal swab, presence of viral mutation(s) within the areas targeted by this assay, and inadequate number of viral copies (<131 copies/mL). A negative result must be combined with clinical observations, patient history, and epidemiological information. The expected result is Negative. Fact Sheet for Patients:  https://www.moore.com/ Fact Sheet for Healthcare Providers:  https://www.young.biz/ This test is not yet ap proved or cleared by the [place] FDA and  has been authorized for detection and/or diagnosis of SARS-CoV-2 by FDA under an Emergency Use Authorization (EUA). This EUA will remain  in effect (meaning this test can be used) for the duration of the COVID-19 declaration under Section 564(b)(1) of the Act, 21 U.S.C. section 360bbb-3(b)(1), unless the authorization is terminated or revoked sooner.    Influenza A by PCR NEGATIVE NEGATIVE Final   Influenza B by PCR NEGATIVE NEGATIVE Final    Comment: (NOTE) The Xpert Xpress SARS-CoV-2/FLU/RSV assay is intended as an aid in  the diagnosis of influenza from  Nasopharyngeal swab specimens and  should not be used as a sole basis for treatment. Nasal washings and  aspirates are unacceptable for Xpert Xpress SARS-CoV-2/FLU/RSV  testing. Fact Sheet for Patients: https://www.moore.com/ Fact Sheet for Healthcare Providers: https://www.young.biz/ This test is not yet approved or cleared by the [place] FDA and  has been authorized for detection and/or diagnosis of SARS-CoV-2 by  FDA under an Emergency Use Authorization (EUA). This EUA will remain  in effect (meaning this test can be used) for the duration of the  Covid-19 declaration under Section 564(b)(1) of the Act, 21  U.S.C. section 360bbb-3(b)(1), unless the authorization is  terminated or revoked. Performed at [facility], [address] [place], [place] [address]       Radiology Studies: CT ANGIO HEAD W OR WO CONTRAST  Result Date: 01/20/2020 CLINICAL DATA:  Left hand weakness EXAM: CT ANGIOGRAPHY HEAD AND NECK TECHNIQUE: Multidetector CT imaging of the head and neck was performed using the standard protocol during bolus administration of intravenous contrast. Multiplanar CT image reconstructions and MIPs were obtained to evaluate the vascular anatomy. Carotid stenosis measurements (when applicable) are obtained utilizing NASCET criteria, using the distal internal carotid diameter as the denominator. CONTRAST:  66mL OMNIPAQUE IOHEXOL 350 MG/ML SOLN COMPARISON:  None. FINDINGS: CT HEAD FINDINGS Brain: There is no mass, hemorrhage or extra-axial collection. There is generalized atrophy without lobar predilection. There is an old right subinsular infarct. There is hypoattenuation of the periventricular white matter, most commonly indicating chronic ischemic microangiopathy. Skull: The visualized skull base, calvarium and extracranial soft tissues are normal. Sinuses/Orbits: No fluid levels or advanced mucosal thickening of the visualized paranasal sinuses. No  mastoid or middle ear effusion. The orbits are normal. CTA NECK FINDINGS SKELETON: There is no bony spinal canal stenosis. No lytic or blastic lesion. OTHER NECK: Normal pharynx, larynx and major salivary glands. No cervical lymphadenopathy. Unremarkable thyroid gland. UPPER CHEST: Emphysema AORTIC ARCH: There is no calcific atherosclerosis of the aortic arch. There is no aneurysm, dissection or hemodynamically significant stenosis of the visualized portion of the aorta. Conventional 3 vessel aortic branching pattern. The visualized proximal subclavian arteries are widely patent. RIGHT CAROTID SYSTEM: No dissection, occlusion or aneurysm. There is mixed density atherosclerosis extending into the proximal ICA, resulting in less than 50% stenosis. LEFT CAROTID SYSTEM: No dissection, occlusion or aneurysm. There is mixed density atherosclerosis extending into the  proximal ICA, resulting in less than 50% stenosis. VERTEBRAL ARTERIES: Left dominant configuration. Both origins are clearly patent. There is no dissection, occlusion or flow-limiting stenosis to the skull base (V1-V3 segments). CTA HEAD FINDINGS POSTERIOR CIRCULATION: --Vertebral arteries: Normal V4 segments. --Posterior inferior cerebellar arteries (PICA): Patent origins from the vertebral arteries. --Anterior inferior cerebellar arteries (AICA): Patent origins from the basilar artery. --Basilar artery: Diminutive --Superior cerebellar arteries: Normal. --Posterior cerebral arteries: Normal. Both are predominantly supplied by the posterior communicating arteries (p-comm). ANTERIOR CIRCULATION: --Intracranial internal carotid arteries: Normal. --Anterior cerebral arteries (ACA): Normal. Both A1 segments are present. Patent anterior communicating artery (a-comm). --Middle cerebral arteries (MCA): Normal. VENOUS SINUSES: As permitted by contrast timing, patent. ANATOMIC VARIANTS: Fetal origins of both posterior cerebral arteries. Review of the MIP images  confirms the above findings. IMPRESSION: 1. No intracranial arterial occlusion or high-grade stenosis. 2. Bilateral mixed density atherosclerosis of the proximal internal carotid arteries without hemodynamically significant stenosis by NASCET criteria. 3. Chronic small vessel ischemia. 4. Emphysema (ICD10-J43.9). Electronically Signed   By: Deatra Robinson M.D.   On: 01/20/2020 00:27   CT Head Wo Contrast  Result Date: 01/19/2020 CLINICAL DATA:  Unable to move left hand since yesterday EXAM: CT HEAD WITHOUT CONTRAST TECHNIQUE: Contiguous axial images were obtained from the base of the skull through the vertex without intravenous contrast. COMPARISON:  MRI 10/23/2019 FINDINGS: Brain: Stable region of gliosis in the right insula/external capsule. New hypoattenuating focus in the left corona radiata may correspond to an evolving lacunar infarct seen on comparison MRI. Previously seen thalamic diffusion restriction is without CT correlate. No convincing CT evidence of vascular territory or cortically based acute infarction. No acute hemorrhage, hydrocephalus, extra-axial collection or mass lesion/mass effect. Symmetric prominence of the cisterns and sulci compatible with parenchymal volume loss. Asymmetric dilatation of the right lateral ventricle likely related to ex vacuo dilatation. Patchy areas of white matter hypoattenuation are most compatible with chronic microvascular angiopathy. Vascular: Atherosclerotic calcification of the carotid siphons. No hyperdense vessel. Skull: No calvarial fracture or suspicious osseous lesion. No scalp swelling or hematoma. Sinuses/Orbits: Paranasal sinuses and mastoid air cells are predominantly clear. Included orbital structures are unremarkable. Other: None IMPRESSION: 1. No convincing CT evidence of acute vascular territory or cortically based infarction. If there is persisting clinical concern, MRI could be obtained. 2. Tiny hypoattenuating focus in the left corona radiata may  correspond to evolution of a lacunar infarct seen on comparison MRI. Previously seen thalamic diffusion restriction is without CT correlate. Stable region of gliosis in the right insula/external capsule. 3. Stable parenchymal volume loss and chronic microvascular angiopathy. Electronically Signed   By: Kreg Shropshire M.D.   On: 01/19/2020 23:12   CT ANGIO NECK W OR WO CONTRAST  Result Date: 01/20/2020 CLINICAL DATA:  Left hand weakness EXAM: CT ANGIOGRAPHY HEAD AND NECK TECHNIQUE: Multidetector CT imaging of the head and neck was performed using the standard protocol during bolus administration of intravenous contrast. Multiplanar CT image reconstructions and MIPs were obtained to evaluate the vascular anatomy. Carotid stenosis measurements (when applicable) are obtained utilizing NASCET criteria, using the distal internal carotid diameter as the denominator. CONTRAST:  75mL OMNIPAQUE IOHEXOL 350 MG/ML SOLN COMPARISON:  None. FINDINGS: CT HEAD FINDINGS Brain: There is no mass, hemorrhage or extra-axial collection. There is generalized atrophy without lobar predilection. There is an old right subinsular infarct. There is hypoattenuation of the periventricular white matter, most commonly indicating chronic ischemic microangiopathy. Skull: The visualized skull base, calvarium and extracranial soft  tissues are normal. Sinuses/Orbits: No fluid levels or advanced mucosal thickening of the visualized paranasal sinuses. No mastoid or middle ear effusion. The orbits are normal. CTA NECK FINDINGS SKELETON: There is no bony spinal canal stenosis. No lytic or blastic lesion. OTHER NECK: Normal pharynx, larynx and major salivary glands. No cervical lymphadenopathy. Unremarkable thyroid gland. UPPER CHEST: Emphysema AORTIC ARCH: There is no calcific atherosclerosis of the aortic arch. There is no aneurysm, dissection or hemodynamically significant stenosis of the visualized portion of the aorta. Conventional 3 vessel aortic  branching pattern. The visualized proximal subclavian arteries are widely patent. RIGHT CAROTID SYSTEM: No dissection, occlusion or aneurysm. There is mixed density atherosclerosis extending into the proximal ICA, resulting in less than 50% stenosis. LEFT CAROTID SYSTEM: No dissection, occlusion or aneurysm. There is mixed density atherosclerosis extending into the proximal ICA, resulting in less than 50% stenosis. VERTEBRAL ARTERIES: Left dominant configuration. Both origins are clearly patent. There is no dissection, occlusion or flow-limiting stenosis to the skull base (V1-V3 segments). CTA HEAD FINDINGS POSTERIOR CIRCULATION: --Vertebral arteries: Normal V4 segments. --Posterior inferior cerebellar arteries (PICA): Patent origins from the vertebral arteries. --Anterior inferior cerebellar arteries (AICA): Patent origins from the basilar artery. --Basilar artery: Diminutive --Superior cerebellar arteries: Normal. --Posterior cerebral arteries: Normal. Both are predominantly supplied by the posterior communicating arteries (p-comm). ANTERIOR CIRCULATION: --Intracranial internal carotid arteries: Normal. --Anterior cerebral arteries (ACA): Normal. Both A1 segments are present. Patent anterior communicating artery (a-comm). --Middle cerebral arteries (MCA): Normal. VENOUS SINUSES: As permitted by contrast timing, patent. ANATOMIC VARIANTS: Fetal origins of both posterior cerebral arteries. Review of the MIP images confirms the above findings. IMPRESSION: 1. No intracranial arterial occlusion or high-grade stenosis. 2. Bilateral mixed density atherosclerosis of the proximal internal carotid arteries without hemodynamically significant stenosis by NASCET criteria. 3. Chronic small vessel ischemia. 4. Emphysema (ICD10-J43.9). Electronically Signed   By: Ulyses Jarred M.D.   On: 01/20/2020 00:27   MR BRAIN W WO CONTRAST  Result Date: 01/21/2020 CLINICAL DATA:  Left hand weakness EXAM: MRI HEAD WITHOUT AND WITH  CONTRAST MRI CERVICAL SPINE WITHOUT AND WITH CONTRAST TECHNIQUE: Multiplanar, multiecho pulse sequences of the brain and surrounding structures, and cervical spine, to include the craniocervical junction and cervicothoracic junction, were obtained without and with intravenous contrast. CONTRAST:  7.67mL GADAVIST GADOBUTROL 1 MMOL/ML IV SOLN COMPARISON:  10/23/2019 brain MRI FINDINGS: MRI HEAD FINDINGS Brain: No acute infarct, acute hemorrhage or extra-axial collection. There are numerous white matter lesions oriented perpendicularly to the long axis of the lateral ventricles, unchanged. There is atrophy greater than expected for age. There is an old infarct of the right basal ganglia. No chronic microhemorrhage. Normal midline structures. There is no abnormal contrast enhancement. Vascular: Normal flow voids. Skull and upper cervical spine: Normal marrow signal. Sinuses/Orbits: Negative. Other: None. MRI CERVICAL SPINE FINDINGS Alignment: Mild straightening of normal cervical lordosis. No static subluxation. Vertebrae: No fracture, evidence of discitis, or bone lesion. Cord: Normal signal and morphology. Posterior Fossa, vertebral arteries, paraspinal tissues: Negative Disc levels: C1-2: Unremarkable. C2-3: Normal disc space and facet joints. There is no spinal canal stenosis. No neural foraminal stenosis. C3-4: Bilateral uncovertebral hypertrophy with small disc bulge. Mild spinal canal stenosis. Moderate bilateral neural foraminal stenosis. C4-5: Small central disc protrusion. Normal facets. Mild spinal canal stenosis. No neural foraminal stenosis. C5-6: Small central disc protrusion with bilateral uncovertebral spurring. Mild spinal canal stenosis. Moderate right neural foraminal stenosis. C6-7: Disc desiccation without herniation. There is no spinal canal stenosis. No neural foraminal  stenosis. C7-T1: Normal disc space and facet joints. There is no spinal canal stenosis. No neural foraminal stenosis. No abnormal  contrast enhancement. IMPRESSION: 1. Unchanged distribution of white matter lesions in a pattern consistent with multiple sclerosis. No active demyelinating lesions. 2. No demyelinating lesions of the cervical spinal cord. 3. Unchanged appearance of cervical degenerative disc disease with mild spinal canal stenosis at C3-4, C4-5 and C5-6. 4. Moderate bilateral C3-4 and right C5-6 neural foraminal stenosis. Electronically Signed   By: Deatra Robinson M.D.   On: 01/21/2020 00:40   MR CERVICAL SPINE W WO CONTRAST  Result Date: 01/21/2020 CLINICAL DATA:  Left hand weakness EXAM: MRI HEAD WITHOUT AND WITH CONTRAST MRI CERVICAL SPINE WITHOUT AND WITH CONTRAST TECHNIQUE: Multiplanar, multiecho pulse sequences of the brain and surrounding structures, and cervical spine, to include the craniocervical junction and cervicothoracic junction, were obtained without and with intravenous contrast. CONTRAST:  7.36mL GADAVIST GADOBUTROL 1 MMOL/ML IV SOLN COMPARISON:  10/23/2019 brain MRI FINDINGS: MRI HEAD FINDINGS Brain: No acute infarct, acute hemorrhage or extra-axial collection. There are numerous white matter lesions oriented perpendicularly to the long axis of the lateral ventricles, unchanged. There is atrophy greater than expected for age. There is an old infarct of the right basal ganglia. No chronic microhemorrhage. Normal midline structures. There is no abnormal contrast enhancement. Vascular: Normal flow voids. Skull and upper cervical spine: Normal marrow signal. Sinuses/Orbits: Negative. Other: None. MRI CERVICAL SPINE FINDINGS Alignment: Mild straightening of normal cervical lordosis. No static subluxation. Vertebrae: No fracture, evidence of discitis, or bone lesion. Cord: Normal signal and morphology. Posterior Fossa, vertebral arteries, paraspinal tissues: Negative Disc levels: C1-2: Unremarkable. C2-3: Normal disc space and facet joints. There is no spinal canal stenosis. No neural foraminal stenosis. C3-4:  Bilateral uncovertebral hypertrophy with small disc bulge. Mild spinal canal stenosis. Moderate bilateral neural foraminal stenosis. C4-5: Small central disc protrusion. Normal facets. Mild spinal canal stenosis. No neural foraminal stenosis. C5-6: Small central disc protrusion with bilateral uncovertebral spurring. Mild spinal canal stenosis. Moderate right neural foraminal stenosis. C6-7: Disc desiccation without herniation. There is no spinal canal stenosis. No neural foraminal stenosis. C7-T1: Normal disc space and facet joints. There is no spinal canal stenosis. No neural foraminal stenosis. No abnormal contrast enhancement. IMPRESSION: 1. Unchanged distribution of white matter lesions in a pattern consistent with multiple sclerosis. No active demyelinating lesions. 2. No demyelinating lesions of the cervical spinal cord. 3. Unchanged appearance of cervical degenerative disc disease with mild spinal canal stenosis at C3-4, C4-5 and C5-6. 4. Moderate bilateral C3-4 and right C5-6 neural foraminal stenosis. Electronically Signed   By: Deatra Robinson M.D.   On: 01/21/2020 00:40   ECHOCARDIOGRAM COMPLETE BUBBLE STUDY  Result Date: 01/20/2020    ECHOCARDIOGRAM REPORT   Patient Name:   Rick Williams Date of Exam: 01/20/2020 Medical Rec #:  [id number]          Height:       70.0 in Accession #:    [id number]         Weight:       170.0 lb Date of Birth:  Mar 12, 1966          BSA:          1.948 m Patient Age:    53 years           BP:           161/103 mmHg Patient Gender: M  HR:           97 bpm. [facility] Procedure: 2D Echo and Saline Contrast Bubble Study Indications:    TIA (transient ischemic attack) 435.9 / G45.9  History:        Patient has no prior history of Echocardiogram examinations.                 Risk Factors:Current Smoker and Hypertension. Alcohol abuse,                 Left arm weakness, IVH (intraventricular hemorrhage.  Sonographer:    Jeryl Columbia RDCS (AE)  Referring Phys: Gomez Cleverly LAMA IMPRESSIONS  1. Left ventricular ejection fraction, by estimation, is 65 to 70%. The left ventricle has hyperdynamic function. The left ventricle has no regional wall motion abnormalities. There is moderate concentric left ventricular hypertrophy. Left ventricular diastolic parameters are consistent with Grade I diastolic dysfunction (impaired relaxation). Elevated left atrial pressure.  2. Right ventricular systolic function is normal. The right ventricular size is normal.  3. Left atrial size was mildly dilated.  4. The mitral valve is normal in structure. Trivial mitral valve regurgitation. No evidence of mitral stenosis.  5. The aortic valve is normal in structure. Aortic valve regurgitation is not visualized. No aortic stenosis is present.  6. The inferior vena cava is normal in size with greater than 50% respiratory variability, suggesting right atrial pressure of 3 mmHg. Conclusion(s)/Recommendation(s): No intracardiac source of embolism detected on this transthoracic study. A transesophageal echocardiogram is recommended to exclude cardiac source of embolism if clinically indicated. FINDINGS  Left Ventricle: Left ventricular ejection fraction, by estimation, is 65 to 70%. The left ventricle has hyperdynamic function. The left ventricle has no regional wall motion abnormalities. The left ventricular internal cavity size was normal in size. There is moderate concentric left ventricular hypertrophy. Left ventricular diastolic parameters are consistent with Grade I diastolic dysfunction (impaired relaxation). Elevated left atrial pressure. Right Ventricle: The right ventricular size is normal. No increase in right ventricular wall thickness. Right ventricular systolic function is normal. Left Atrium: Left atrial size was mildly dilated. Right Atrium: Right atrial size was normal in size. Pericardium: There is no evidence of pericardial effusion. Mitral Valve: The mitral valve is  normal in structure. Normal mobility of the mitral valve leaflets. Trivial mitral valve regurgitation. No evidence of mitral valve stenosis. Tricuspid Valve: The tricuspid valve is normal in structure. Tricuspid valve regurgitation is trivial. No evidence of tricuspid stenosis. Aortic Valve: The aortic valve is normal in structure. Aortic valve regurgitation is not visualized. No aortic stenosis is present. Pulmonic Valve: The pulmonic valve was normal in structure. Pulmonic valve regurgitation is not visualized. No evidence of pulmonic stenosis. Aorta: The aortic root is normal in size and structure. Venous: The inferior vena cava is normal in size with greater than 50% respiratory variability, suggesting right atrial pressure of 3 mmHg. IAS/Shunts: No atrial level shunt detected by color flow Doppler. Agitated saline contrast was given intravenously to evaluate for intracardiac shunting. Tobias Alexander MD Electronically signed by Tobias Alexander MD Signature Date/Time: 01/20/2020/12:42:07 PM    Final     Scheduled Meds: .  stroke: mapping our early stages of recovery book   Does not apply Once  . aspirin EC  81 mg Oral Daily  . atorvastatin  40 mg Oral Daily  . cloNIDine  0.2 mg Oral BID  . enoxaparin (LOVENOX) injection  40 mg Subcutaneous Daily  . folic  acid  1 mg Oral Daily  . multivitamin with minerals  1 tablet Oral Daily  . pantoprazole (PROTONIX) IV  40 mg Intravenous Q24H  . thiamine  100 mg Oral Daily   Or  . thiamine  100 mg Intravenous Daily   Continuous Infusions: . sodium chloride 10 mL/hr at 01/20/20 0339     LOS: 1 day   Time spent: 25 minutes.  Tyrone Nine, MD [facility] [email or web address] 01/21/2020, 3:45 PM

## 2020-01-22 ENCOUNTER — Inpatient Hospital Stay (HOSPITAL_COMMUNITY): Payer: Self-pay

## 2020-01-22 DIAGNOSIS — R202 Paresthesia of skin: Secondary | ICD-10-CM

## 2020-01-22 LAB — COMPREHENSIVE METABOLIC PANEL
ALT: 13 U/L (ref 0–44)
AST: 32 U/L (ref 15–41)
Albumin: 2.8 g/dL — ABNORMAL LOW (ref 3.5–5.0)
Alkaline Phosphatase: 55 U/L (ref 38–126)
Anion gap: 9 (ref 5–15)
BUN: 8 mg/dL (ref 6–20)
CO2: 25 mmol/L (ref 22–32)
Calcium: 8.2 mg/dL — ABNORMAL LOW (ref 8.9–10.3)
Chloride: 97 mmol/L — ABNORMAL LOW (ref 98–111)
Creatinine, Ser: 0.89 mg/dL (ref 0.61–1.24)
GFR calc Af Amer: >60 mL/min (ref >60)
GFR calc non Af Amer: >60 mL/min (ref >60)
Glucose, Bld: 91 mg/dL (ref 70–99)
Potassium: 3.5 mmol/L (ref 3.5–5.1)
Sodium: 131 mmol/L — ABNORMAL LOW (ref 135–145)
Total Bilirubin: 1.7 mg/dL — ABNORMAL HIGH (ref 0.3–1.2)
Total Protein: 7.8 g/dL (ref 6.5–8.1)

## 2020-01-22 MED ORDER — CLONIDINE HCL 0.2 MG PO TABS
0.2000 mg | ORAL_TABLET | Freq: Two times a day (BID) | ORAL | 0 refills | Status: DC
Start: 2020-01-22 — End: 2023-05-28

## 2020-01-22 MED ORDER — ASPIRIN 81 MG PO TBEC: 81.0000 mg | DELAYED_RELEASE_TABLET | Freq: Every day | ORAL | 0 refills | Status: DC

## 2020-01-22 MED ORDER — PANTOPRAZOLE SODIUM 40 MG PO TBEC
40.0000 mg | DELAYED_RELEASE_TABLET | Freq: Every day | ORAL | Status: DC
  Administered 2020-01-22: 40 mg via ORAL
  Filled 2020-01-22: qty 1

## 2020-01-22 MED ORDER — ATORVASTATIN CALCIUM 40 MG PO TABS: 40.0000 mg | ORAL_TABLET | Freq: Every day | ORAL | 0 refills | Status: DC

## 2020-01-22 MED FILL — ATORVASTATIN CALCIUM 40 MG: 40 | 30 days supply | Qty: 30 | Fill #0

## 2020-01-22 MED FILL — cloNIDine HCL 0.2 MG TABS: 0.2 | 30 days supply | Qty: 60 | Fill #0

## 2020-01-22 MED FILL — ASPIRIN LOW DOSE 81 MG TBEC: 81 | 30 days supply | Qty: 30 | Fill #0

## 2020-01-22 NOTE — Progress Notes (Signed)
MRI revealed normal appearance of the left brachial plexus.   TTE revealed normal EF with hyperdynamic function of the left ventricle. No thrombus mentioned in the report.   EEG is pending.   If EEG is negative, will need expedited outpatient EMG/NCS of the left upper extremity (within 1 week).  Electronically signed: Dr. Caryl Pina

## 2020-01-22 NOTE — Progress Notes (Signed)
EEG complete - results pending 

## 2020-01-22 NOTE — TOC Initial Note (Signed)
Transition of Care [facility]) - Initial/Assessment Note    Patient Details  Name: Rick Williams MRN: [id number] Date of Birth: January 05, 1966  Transition of Care [facility] CM/SW Contact:    Angelita Ingles, RN Phone Number: 01/22/2020, 3:46 PM  Clinical Narrative:                 Cm at bedside provided patient with alcohol and substance abuse resources. Patient does not have PCP. Appointment has been set up with [facility] in [place]. Info given to patient and added to avs summary. Match form has been completed and medications have been delivered to the room. Patient states that he has transportation to appointments. No further needs noted at this time/ CM will continue to follow for any other needs.   Expected Discharge Plan: Home/Self Care Barriers to Discharge: No Barriers Identified   Patient Goals and CMS Choice Patient states their goals for this hospitalization and ongoing recovery are:: Wants to go home   Choice offered to / list presented to : NA  Expected Discharge Plan and Services Expected Discharge Plan: Home/Self Care In-house Referral: NA Discharge Planning Services: CM Consult Post Acute Care Choice: NA Living arrangements for the past 2 months: Single Family Home                 DME Arranged: N/A DME Agency: NA       HH Arranged: NA HH Agency: NA        Prior Living Arrangements/Services Living arrangements for the past 2 months: Single Family Home Lives with:: Self Patient language and need for interpreter reviewed:: Yes Do you feel safe going back to the place where you live?: Yes      Need for Family Participation in Patient Care: Yes (Comment) Care giver support system in place?: Yes (comment) Current home services: Other (comment)(none) Criminal Activity/Legal Involvement Pertinent to Current Situation/Hospitalization: No - Comment as needed  Activities of Daily Living Home Assistive Devices/Equipment: None ADL Screening (condition at time of  admission) Patient's cognitive ability adequate to safely complete daily activities?: Yes Is the patient deaf or have difficulty hearing?: No Does the patient have difficulty seeing, even when wearing glasses/contacts?: Yes Does the patient have difficulty concentrating, remembering, or making decisions?: No Patient able to express need for assistance with ADLs?: Yes Does the patient have difficulty dressing or bathing?: No Independently performs ADLs?: Yes (appropriate for developmental age) Does the patient have difficulty walking or climbing stairs?: No Weakness of Legs: None Weakness of Arms/Hands: Left(new onset left hand numbness/weakness)  Permission Sought/Granted Permission sought to share information with : Family Supports Permission granted to share information with : Yes, Verbal Permission Granted  Share Information with NAME: Dan Maker [id number]     Permission granted to share info w Relationship: niece     Emotional Assessment Appearance:: Appears stated age Attitude/Demeanor/Rapport: Gracious Affect (typically observed): Calm, Quiet Orientation: : Oriented to Self, Oriented to Place, Oriented to  Time, Oriented to Situation Alcohol / Substance Use: Alcohol Use(Substance abuse/ alcholol resources given) Psych Involvement: No (comment)  Admission diagnosis:  Right arm numbness [R20.0] Left arm weakness [R29.898] Right hand weakness [R29.898] Macrocytosis without anemia [D75.89] Left wrist drop [M21.332] Elevated AST (SGOT) [R74.01] Alcohol intoxication, uncomplicated ([place]) [I29.798] Patient Active Problem List   Diagnosis Date Noted  . Left arm weakness 01/20/2020  . Wrist drop, left 01/20/2020  . Left wrist drop 01/20/2020  . Alcohol intoxication, uncomplicated ([place])   . Elevated AST (SGOT)   .  Macrocytosis without anemia   . Uncontrolled hypertension   . Alcohol abuse   . Visual impairment   . Left-sided weakness 02/26/2013  . Leg weakness 02/26/2013  .  Pain in joint, lower leg 02/26/2013  . Muscle weakness (generalized) 02/26/2013  . Lack of coordination 02/26/2013  . ICH (intracerebral hemorrhage) (HCC) 02/07/2013  . Essential hypertension, malignant 02/06/2013  . Encounter for long-term (current) use of high-risk medication 02/06/2013  . History of Hemorrhagic stroke 01/30/2013  . Acute respiratory failure due to AMS 01/30/2013  . Hypertension 01/30/2013  . IVH (intraventricular hemorrhage) (HCC) 01/30/2013  . Intracranial hypertension 01/30/2013   PCP:  Patient, No Pcp Per Pharmacy:   [facility] - [place] - [address] [place] [address] Phone: [phone] Fax: [phone]  [facility] - [place], [place] - [address] [place] [address] Phone: [phone] Fax: [phone]     Social Determinants of Health (SDOH) Interventions    Readmission Risk Interventions Readmission Risk Prevention Plan 01/22/2020 01/22/2020  Post Dischage Appt (No Data) Complete  Medication Screening Complete -  Transportation Screening Complete Complete  Some recent data might be hidden

## 2020-01-22 NOTE — Progress Notes (Signed)
PROGRESS NOTE  Rick Williams  [id number]:[id number] DOB: 10-06-1965 DOA: 01/19/2020 PCP: Patient, No Pcp Per   Brief Narrative: Rick Williams is a 54 y.o. male with a history of HTN, alcohol abuse, CVA 2007, cerebral hemorrhage 2014 who presented to [facility] ED with weakness and numbness in the left wrist/hand. He was evaluated by teleneurology who recommended neurology consult and MRI for which the patient was transferred to [facility]. This showed evidence of old right basal ganglia hemorrhage, no acute stroke and is read by radiology as consistent with multiple sclerosis. Neurology is consulted, further work up is planned.  Assessment & Plan: Principal Problem:   Wrist drop, left Active Problems:   History of Hemorrhagic stroke   Essential hypertension, malignant   Left arm weakness   Alcohol intoxication, uncomplicated (HCC)   Elevated AST (SGOT)   Alcohol abuse   Visual impairment   Left wrist drop  Left hand weakness and numbness: Unclear exact Dx. CRP neg. B12 277. HIV NR. - Appreciate neurology evaluation: MR to evaluate lung apex and brachial plexus shows no plexopathy or mass.  - Todd's paralysis also possible, EEG ordered by neurology yesterday, though I don't see this. I've reordered it. - If EEG negative, per neurology, will need outpatient EMG/NCS which can be arranged by calling [facility] or [facility].  - Continuing ASA, statin  - PT/OT consulted, spica splint placed.  Binocular visual impairment, decreased visual acuity: No explanatory lesion on MRI.  - Ophthalmology consulted for evaluation. Spoke with Dr. Allena Katz who recommended close outpatient follow up, advised to call his office at discharge for an appointment this week.  Alcohol abuse: EtOH level elevated at admission to 260.  - Monitor for withdrawal, Tx as needed.  - Vitamin supplementation empirically  HTN:  - Restarted clonidine  Vitamin D deficiency:  - Supplement.    Hypomagnesemia:  - Supplement and monitor  Abnormal TSH: 4.716 with normal free T4.  - Recheck at follow up.   Prediabetes:  - Outpatient follow up recommended.   DVT prophylaxis: Lovenox Code Status: Full Family Communication: None at bedside Disposition Plan:  Status is: Inpatient  Remains inpatient appropriate because:Ongoing diagnostic testing needed not appropriate for outpatient work up (EEG)   Dispo: The patient is from: Home              Anticipated d/c is to: Home              Anticipated d/c date is: 1 day              Patient currently is not medically stable to d/c.  Consultants:   Neurology  Ophthalmology, Dr. Carmela Rima  Procedures:   None  Antimicrobials:  None   Subjective: No change in weakness and numbness in left hand/wrist. No new numbness/weakness elsewhere. No seizure activity or other issues overnight.   Objective: Vitals:   01/21/20 1534 01/21/20 2211 01/22/20 0804 01/22/20 0808  BP: (!) 140/97 (!) 140/99 (!) 155/99 (!) 157/86  Pulse: 69 63 (!) 58 87  Resp: 19 20 18 19   Temp: 98 F (36.7 C) 98.1 F (36.7 C) 98.1 F (36.7 C) 98.2 F (36.8 C)  TempSrc: Oral Oral    SpO2: 100% 99% 100% 96%  Weight:      Height:        Intake/Output Summary (Last 24 hours) at 01/22/2020 1449 Last data filed at 01/22/2020 0401 Gross per 24 hour  Intake 120 ml  Output 300 ml  Net -180 ml   Filed Weights   01/19/20 2147 01/20/20 2027  Weight: 77.1 kg 70.4 kg   Gen: 54 y.o. male in no distress Pulm: Nonlabored breathing room air. Clear. CV: Regular rate and rhythm. No murmur, rub, or gallop. No JVD, no dependent edema. GI: Abdomen soft, non-tender, non-distended, with normoactive bowel sounds.  Ext: Warm, no deformities Skin: No rashes, lesions or ulcers on visualized skin. Neuro: Alert and oriented. Stable LUE focal neurological deficits. Psych: Judgement and insight appear fair. Mood euthymic & affect congruent. Behavior is appropriate.     Data Reviewed: I have personally reviewed following labs and imaging studies  CBC: Recent Labs  Lab 01/19/20 2230 01/20/20 0449  WBC 5.6 8.0  NEUTROABS 2.2  --   HGB 14.0 14.1  HCT 41.0 41.9  MCV 104.3* 102.9*  PLT 148* 145*   Basic Metabolic Panel: Recent Labs  Lab 01/19/20 2307 01/20/20 0449 01/22/20 0401  NA 137 138 131*  K 3.5 3.9 3.5  CL 102 105 97*  CO2 24 23 25   GLUCOSE 97 71 91  BUN 8 8 8   CREATININE 0.89 0.86 0.89  CALCIUM 8.2* 8.1* 8.2*  MG  --  1.4*  --   PHOS  --  3.6  --    GFR: Estimated Creatinine Clearance: 95.6 mL/min (by C-G formula based on SCr of 0.89 mg/dL). Liver Function Tests: Recent Labs  Lab 01/19/20 2307 01/20/20 0449 01/22/20 0401  AST 49* 49* 32  ALT 15 15 13   ALKPHOS 68 67 55  BILITOT 0.8 0.9 1.7*  PROT 8.1 7.8 7.8  ALBUMIN 3.4* 3.3* 2.8*   No results for input(s): LIPASE, AMYLASE in the last 168 hours. No results for input(s): AMMONIA in the last 168 hours. Coagulation Profile: Recent Labs  Lab 01/19/20 2327  INR 1.1   Cardiac Enzymes: No results for input(s): CKTOTAL, CKMB, CKMBINDEX, TROPONINI in the last 168 hours. BNP (last 3 results) No results for input(s): PROBNP in the last 8760 hours. HbA1C: Recent Labs    01/20/20 0449  HGBA1C 5.8*   CBG: Recent Labs  Lab 01/19/20 2347 01/21/20 1131 01/21/20 1608  GLUCAP 92 86 122*   Lipid Profile: Recent Labs    01/20/20 0450  CHOL 206*  HDL 85  LDLCALC 92  TRIG 143  CHOLHDL 2.4   Thyroid Function Tests: Recent Labs    01/20/20 0450 01/20/20 0500  TSH 4.716*  --   FREET4  --  0.63   Anemia Panel: Recent Labs    01/20/20 0450  VITAMINB12 277   Urine analysis:    Component Value Date/Time   COLORURINE COLORLESS (A) 01/19/2020 2319   APPEARANCEUR CLEAR 01/19/2020 2319   LABSPEC 1.000 (L) 01/19/2020 2319   PHURINE 6.0 01/19/2020 2319   GLUCOSEU NEGATIVE 01/19/2020 2319   HGBUR NEGATIVE 01/19/2020 2319   BILIRUBINUR NEGATIVE 01/19/2020  2319   KETONESUR NEGATIVE 01/19/2020 2319   PROTEINUR NEGATIVE 01/19/2020 2319   UROBILINOGEN 0.2 02/08/2013 1605   NITRITE NEGATIVE 01/19/2020 2319   LEUKOCYTESUR NEGATIVE 01/19/2020 2319   Recent Results (from the past 240 hour(s))  Respiratory Panel by RT PCR (Flu A&B, Covid) - Nasopharyngeal Swab     Status: None   Collection Time: 01/19/20 11:20 PM   Specimen: Nasopharyngeal Swab  Result Value Ref Range Status   SARS Coronavirus 2 by RT PCR NEGATIVE NEGATIVE Final    Comment: (NOTE) SARS-CoV-2 target nucleic acids are NOT DETECTED. The SARS-CoV-2 RNA is generally detectable in upper  respiratoy specimens during the acute phase of infection. The lowest concentration of SARS-CoV-2 viral copies this assay can detect is 131 copies/mL. A negative result does not preclude SARS-Cov-2 infection and should not be used as the sole basis for treatment or other patient management decisions. A negative result may occur with  improper specimen collection/handling, submission of specimen other than nasopharyngeal swab, presence of viral mutation(s) within the areas targeted by this assay, and inadequate number of viral copies (<131 copies/mL). A negative result must be combined with clinical observations, patient history, and epidemiological information. The expected result is Negative. Fact Sheet for Patients:  PinkCheek.be Fact Sheet for Healthcare Providers:  GravelBags.it This test is not yet ap proved or cleared by the [place] FDA and  has been authorized for detection and/or diagnosis of SARS-CoV-2 by FDA under an Emergency Use Authorization (EUA). This EUA will remain  in effect (meaning this test can be used) for the duration of the COVID-19 declaration under Section 564(b)(1) of the Act, 21 U.S.C. section 360bbb-3(b)(1), unless the authorization is terminated or revoked sooner.    Influenza A by PCR NEGATIVE NEGATIVE  Final   Influenza B by PCR NEGATIVE NEGATIVE Final    Comment: (NOTE) The Xpert Xpress SARS-CoV-2/FLU/RSV assay is intended as an aid in  the diagnosis of influenza from Nasopharyngeal swab specimens and  should not be used as a sole basis for treatment. Nasal washings and  aspirates are unacceptable for Xpert Xpress SARS-CoV-2/FLU/RSV  testing. Fact Sheet for Patients: PinkCheek.be Fact Sheet for Healthcare Providers: GravelBags.it This test is not yet approved or cleared by the [place] FDA and  has been authorized for detection and/or diagnosis of SARS-CoV-2 by  FDA under an Emergency Use Authorization (EUA). This EUA will remain  in effect (meaning this test can be used) for the duration of the  Covid-19 declaration under Section 564(b)(1) of the Act, 21  U.S.C. section 360bbb-3(b)(1), unless the authorization is  terminated or revoked. Performed at [facility], [address] [place], [place] [address]       Radiology Studies: MR BRAIN W WO CONTRAST  Result Date: 01/21/2020 CLINICAL DATA:  Left hand weakness EXAM: MRI HEAD WITHOUT AND WITH CONTRAST MRI CERVICAL SPINE WITHOUT AND WITH CONTRAST TECHNIQUE: Multiplanar, multiecho pulse sequences of the brain and surrounding structures, and cervical spine, to include the craniocervical junction and cervicothoracic junction, were obtained without and with intravenous contrast. CONTRAST:  7.60mL GADAVIST GADOBUTROL 1 MMOL/ML IV SOLN COMPARISON:  10/23/2019 brain MRI FINDINGS: MRI HEAD FINDINGS Brain: No acute infarct, acute hemorrhage or extra-axial collection. There are numerous white matter lesions oriented perpendicularly to the long axis of the lateral ventricles, unchanged. There is atrophy greater than expected for age. There is an old infarct of the right basal ganglia. No chronic microhemorrhage. Normal midline structures. There is no abnormal contrast enhancement. Vascular:  Normal flow voids. Skull and upper cervical spine: Normal marrow signal. Sinuses/Orbits: Negative. Other: None. MRI CERVICAL SPINE FINDINGS Alignment: Mild straightening of normal cervical lordosis. No static subluxation. Vertebrae: No fracture, evidence of discitis, or bone lesion. Cord: Normal signal and morphology. Posterior Fossa, vertebral arteries, paraspinal tissues: Negative Disc levels: C1-2: Unremarkable. C2-3: Normal disc space and facet joints. There is no spinal canal stenosis. No neural foraminal stenosis. C3-4: Bilateral uncovertebral hypertrophy with small disc bulge. Mild spinal canal stenosis. Moderate bilateral neural foraminal stenosis. C4-5: Small central disc protrusion. Normal facets. Mild spinal canal stenosis. No neural foraminal stenosis. C5-6: Small central disc protrusion  with bilateral uncovertebral spurring. Mild spinal canal stenosis. Moderate right neural foraminal stenosis. C6-7: Disc desiccation without herniation. There is no spinal canal stenosis. No neural foraminal stenosis. C7-T1: Normal disc space and facet joints. There is no spinal canal stenosis. No neural foraminal stenosis. No abnormal contrast enhancement. IMPRESSION: 1. Unchanged distribution of white matter lesions in a pattern consistent with multiple sclerosis. No active demyelinating lesions. 2. No demyelinating lesions of the cervical spinal cord. 3. Unchanged appearance of cervical degenerative disc disease with mild spinal canal stenosis at C3-4, C4-5 and C5-6. 4. Moderate bilateral C3-4 and right C5-6 neural foraminal stenosis. Electronically Signed   By: Deatra Robinson M.D.   On: 01/21/2020 00:40   MR CHEST W WO CONTRAST  Result Date: 01/21/2020 CLINICAL DATA:  Possible radial nerve palsy. No left shoulder or upper arm trauma. Was unable to move the left arm. EXAM: MRI BRACHIAL PLEXUS WITHOUT CONTRAST TECHNIQUE: Multiplanar, multiecho pulse sequences of the neck and surrounding structures were obtained  without intravenous contrast. The field of view was focused on the leftbrachial plexus from the neural foramina to the axilla. CONTRAST:  77mL GADAVIST GADOBUTROL 1 MMOL/ML IV SOLN COMPARISON:  MRI cervical spine 01/21/2020 FINDINGS: Patient motion degrades image quality limiting evaluation. Spinal cord Normal caliber and signal of visualized spinal cord. Brachial plexus Roots: Normal. Trunks: Normal. Divisions: Normal. Cords: Normal. Branches: Normal. Muscles and tendons Normal. Bones C3-4: Mild broad-based disc bulge. Bilateral uncovertebral degenerative changes with bilateral foraminal stenosis. Mild spinal stenosis. C4-5: Small central disc protrusion.  Mild spinal stenosis. C5-6: Broad-based disc bulge with a small central disc protrusion. Moderate right foraminal stenosis. C7 transverse processes: Normal. Marrow signal: Normal. Other findings None IMPRESSION: 1. Normal left brachial plexus. 2. Cervical spine spondylosis as detailed above, but better characterized on MRI of the cervical spine performed earlier same day. Electronically Signed   By: Elige Ko   On: 01/21/2020 20:43   MR CERVICAL SPINE W WO CONTRAST  Result Date: 01/21/2020 CLINICAL DATA:  Left hand weakness EXAM: MRI HEAD WITHOUT AND WITH CONTRAST MRI CERVICAL SPINE WITHOUT AND WITH CONTRAST TECHNIQUE: Multiplanar, multiecho pulse sequences of the brain and surrounding structures, and cervical spine, to include the craniocervical junction and cervicothoracic junction, were obtained without and with intravenous contrast. CONTRAST:  7.96mL GADAVIST GADOBUTROL 1 MMOL/ML IV SOLN COMPARISON:  10/23/2019 brain MRI FINDINGS: MRI HEAD FINDINGS Brain: No acute infarct, acute hemorrhage or extra-axial collection. There are numerous white matter lesions oriented perpendicularly to the long axis of the lateral ventricles, unchanged. There is atrophy greater than expected for age. There is an old infarct of the right basal ganglia. No chronic  microhemorrhage. Normal midline structures. There is no abnormal contrast enhancement. Vascular: Normal flow voids. Skull and upper cervical spine: Normal marrow signal. Sinuses/Orbits: Negative. Other: None. MRI CERVICAL SPINE FINDINGS Alignment: Mild straightening of normal cervical lordosis. No static subluxation. Vertebrae: No fracture, evidence of discitis, or bone lesion. Cord: Normal signal and morphology. Posterior Fossa, vertebral arteries, paraspinal tissues: Negative Disc levels: C1-2: Unremarkable. C2-3: Normal disc space and facet joints. There is no spinal canal stenosis. No neural foraminal stenosis. C3-4: Bilateral uncovertebral hypertrophy with small disc bulge. Mild spinal canal stenosis. Moderate bilateral neural foraminal stenosis. C4-5: Small central disc protrusion. Normal facets. Mild spinal canal stenosis. No neural foraminal stenosis. C5-6: Small central disc protrusion with bilateral uncovertebral spurring. Mild spinal canal stenosis. Moderate right neural foraminal stenosis. C6-7: Disc desiccation without herniation. There is no spinal canal stenosis. No neural  foraminal stenosis. C7-T1: Normal disc space and facet joints. There is no spinal canal stenosis. No neural foraminal stenosis. No abnormal contrast enhancement. IMPRESSION: 1. Unchanged distribution of white matter lesions in a pattern consistent with multiple sclerosis. No active demyelinating lesions. 2. No demyelinating lesions of the cervical spinal cord. 3. Unchanged appearance of cervical degenerative disc disease with mild spinal canal stenosis at C3-4, C4-5 and C5-6. 4. Moderate bilateral C3-4 and right C5-6 neural foraminal stenosis. Electronically Signed   By: Deatra Robinson M.D.   On: 01/21/2020 00:40    Scheduled Meds: .  stroke: mapping our early stages of recovery book   Does not apply Once  . aspirin EC  81 mg Oral Daily  . atorvastatin  40 mg Oral Daily  . cloNIDine  0.2 mg Oral BID  . enoxaparin (LOVENOX)  injection  40 mg Subcutaneous Daily  . folic acid  1 mg Oral Daily  . multivitamin with minerals  1 tablet Oral Daily  . pantoprazole  40 mg Oral QHS  . thiamine  100 mg Oral Daily   Or  . thiamine  100 mg Intravenous Daily  . Vitamin D (Ergocalciferol)  50,000 Units Oral Q7 days   Continuous Infusions: . sodium chloride 10 mL/hr at 01/20/20 0339     LOS: 2 days   Time spent: 25 minutes.  Tyrone Nine, MD [facility] [email or web address] 01/22/2020, 2:49 PM

## 2020-01-22 NOTE — Procedures (Signed)
Patient Name: KAHNE HELFAND  MRN: [id number]  Epilepsy Attending: Charlsie Quest  Referring Physician/Provider: Dr Hazeline Junker Date: 01/22/2020 Duration: 25.29 mins  Patient history: 54 year old male presenting to the ED with a 4-day history of left hand numbness in conjunction with weakness of his left wrist and fingers. eports similar episode few months ago which resolved. EEG to evaluate for seizure.  Level of alertness: awake, asleep  AEDs during EEG study: None  Technical aspects: This EEG study was done with scalp electrodes positioned according to the 10-20 International system of electrode placement. Electrical activity was acquired at a sampling rate of 500Hz  and reviewed with a high frequency filter of 70Hz  and a low frequency filter of 1Hz . EEG data were recorded continuously and digitally stored.   DESCRIPTION: The posterior dominant rhythm consists of 9-10 Hz activity of moderate voltage (25-35 uV) seen predominantly in posterior head regions, symmetric and reactive to eye opening and eye closing. Sleep was characterized by vertex waves, sleep spindles (12-14hz ), maixmal frontocentral. Hyperventilation and photic stimulation were not performed.  IMPRESSION: This study is within normal limits. No seizures or epileptiform discharges were seen throughout the recording.  Sybilla Malhotra 

## 2020-01-22 NOTE — Evaluation (Signed)
Speech Language Pathology Evaluation Patient Details Name: Rick Williams MRN: [id number] DOB: [birth year] Today's Date: 01/22/2020 Time: 2263-3354 SLP Time Calculation (min) (ACUTE ONLY): 16 min  Problem List:  Patient Active Problem List   Diagnosis Date Noted  . Left arm weakness 01/20/2020  . Wrist drop, left 01/20/2020  . Left wrist drop 01/20/2020  . Alcohol intoxication, uncomplicated (HCC)   . Elevated AST (SGOT)   . Macrocytosis without anemia   . Uncontrolled hypertension   . Alcohol abuse   . Visual impairment   . Left-sided weakness 02/26/2013  . Leg weakness 02/26/2013  . Pain in joint, lower leg 02/26/2013  . Muscle weakness (generalized) 02/26/2013  . Lack of coordination 02/26/2013  . ICH (intracerebral hemorrhage) (HCC) 02/07/2013  . Essential hypertension, malignant 02/06/2013  . Encounter for long-term (current) use of high-risk medication 02/06/2013  . History of Hemorrhagic stroke 01/30/2013  . Acute respiratory failure due to AMS 01/30/2013  . Hypertension 01/30/2013  . IVH (intraventricular hemorrhage) (HCC) 01/30/2013  . Intracranial hypertension 01/30/2013   Past Medical History:  Past Medical History:  Diagnosis Date  . Alcohol abuse   . Cerebral hemorrhage (HCC) 2014  . Hemothorax on left   . Hypertension   . Pneumonia   . Stroke [facility]) 2007   no deficits   Past Surgical History: History reviewed. No pertinent surgical history. HPI:  Pt is a 54 yo male presenting with numbness and weakness of L wrist. PMH includes: HTN, CVAs (2007), R basal ganglia ICH (2014) and alcohol abuse.   Assessment / Plan / Recommendation Clinical Impression  Pt's primary impairment appears to visual. Overall, feel he is functional from a cognitive standpoint to continue his activities of daily life in regards to his level of cognitive demand. He recalled 3/5 words in working memory subtest. Verbal problem solving for basic information was functional. Speech is  intelligible and adequate language abilities. No further ST needed at this time.     SLP Assessment  SLP Recommendation/Assessment: Patient does not need any further Speech Lanaguage Pathology Services SLP Visit Diagnosis: Cognitive communication deficit (R41.841)    Follow Up Recommendations  None    Frequency and Duration           SLP Evaluation Cognition  Overall Cognitive Status: Within Functional Limits for tasks assessed Arousal/Alertness: Awake/alert Orientation Level: Oriented X4 Attention: Sustained Sustained Attention: Appears intact Memory: (recalled 3/5 words independently) Awareness: Appears intact Problem Solving: Appears intact Safety/Judgment: Appears intact       Comprehension  Auditory Comprehension Overall Auditory Comprehension: Appears within functional limits for tasks assessed Visual Recognition/Discrimination Discrimination: Not tested Reading Comprehension Reading Status: Not tested    Expression Expression Primary Mode of Expression: Verbal Verbal Expression Overall Verbal Expression: Appears within functional limits for tasks assessed Pragmatics: No impairment Written Expression Dominant Hand: ("both" but usually writes with right) Written Expression: Not tested   Oral / Motor  Oral Motor/Sensory Function Overall Oral Motor/Sensory Function: Within functional limits Motor Speech Overall Motor Speech: Appears within functional limits for tasks assessed Intelligibility: Intelligible   GO                    Rick Williams 01/22/2020, 11:42 AM  Rick Williams.Ed Nurse, children's [phone] Office [phone]

## 2020-01-23 LAB — COMPREHENSIVE METABOLIC PANEL
ALT: 17 U/L (ref 0–44)
AST: 47 U/L — ABNORMAL HIGH (ref 15–41)
Albumin: 2.8 g/dL — ABNORMAL LOW (ref 3.5–5.0)
Alkaline Phosphatase: 59 U/L (ref 38–126)
Anion gap: 8 (ref 5–15)
BUN: 10 mg/dL (ref 6–20)
CO2: 25 mmol/L (ref 22–32)
Calcium: 8.4 mg/dL — ABNORMAL LOW (ref 8.9–10.3)
Chloride: 99 mmol/L (ref 98–111)
Creatinine, Ser: 0.94 mg/dL (ref 0.61–1.24)
GFR calc Af Amer: >60 mL/min (ref >60)
GFR calc non Af Amer: >60 mL/min (ref >60)
Glucose, Bld: 103 mg/dL — ABNORMAL HIGH (ref 70–99)
Potassium: 3.4 mmol/L — ABNORMAL LOW (ref 3.5–5.1)
Sodium: 132 mmol/L — ABNORMAL LOW (ref 135–145)
Total Bilirubin: 1.2 mg/dL (ref 0.3–1.2)
Total Protein: 7.6 g/dL (ref 6.5–8.1)

## 2020-01-23 LAB — FOLATE RBC
Folate, Hemolysate: 270 ng/mL
Folate, RBC: 662 ng/mL (ref >498)
Hematocrit: 40.8 % (ref 37.5–51.0)

## 2020-01-23 MED ORDER — LISINOPRIL 20 MG PO TABS: 20.0000 mg | ORAL_TABLET | Freq: Every day | ORAL | 0 refills | Status: DC

## 2020-01-23 MED ORDER — VITAMIN D (ERGOCALCIFEROL) 1.25 MG (50000 UNIT) PO CAPS
50000.0000 [IU] | ORAL_CAPSULE | ORAL | 0 refills | Status: AC
Start: 2020-01-28 — End: 2020-03-03

## 2020-01-23 MED ORDER — FOLIC ACID 1 MG PO TABS: 1.0000 mg | ORAL_TABLET | Freq: Every day | ORAL | 0 refills | Status: DC

## 2020-01-23 MED ORDER — THIAMINE HCL 100 MG PO TABS: 100.0000 mg | ORAL_TABLET | Freq: Every day | ORAL | 0 refills | Status: DC

## 2020-01-23 MED ORDER — POTASSIUM CHLORIDE CRYS ER 20 MEQ PO TBCR
40.0000 meq | EXTENDED_RELEASE_TABLET | Freq: Once | ORAL | Status: AC
  Administered 2020-01-23: 40 meq via ORAL
  Filled 2020-01-23: qty 2

## 2020-01-23 MED ORDER — LISINOPRIL 20 MG PO TABS: 20.0000 mg | ORAL_TABLET | Freq: Every day | ORAL | Status: DC

## 2020-01-23 MED FILL — LISINOPRIL 20 MG TABLET: 20 | 30 days supply | Qty: 30 | Fill #0

## 2020-01-23 MED FILL — VITAMIN B-1 100 MG TABS: 100 | 30 days supply | Qty: 30 | Fill #0

## 2020-01-23 MED FILL — FOLIC ACID 1 MG TABS: 1 | 30 days supply | Qty: 30 | Fill #0

## 2020-01-23 MED FILL — VIT D2 1.25 MG (50,000 UNIT: 1.25 MG | 35 days supply | Qty: 5 | Fill #0

## 2020-01-23 NOTE — Discharge Summary (Signed)
Physician Discharge Summary  HARRIET BOLLEN WUJ:[id number] DOB: 12/27/1965 DOA: 01/19/2020  PCP: Patient, No Pcp Per  Admit date: 01/19/2020 Discharge date: 01/23/2020  Admitted From: Home Disposition:  Home  Recommendations for Outpatient Follow-up:  1. Follow up with PCP in 1 week 2. Follow-up with Dr. Carmela Rima, ophthalmology 3. Follow up with [facility] in 1 week with outpatient EMG/NCS  Discharge Condition: Stable CODE STATUS: Full  Diet recommendation:  Diet Orders (From admission, onward)    Start     Ordered   01/23/20 0000  Diet - low sodium heart healthy     01/23/20 1023   01/20/20 0813  Diet Heart Room service appropriate? Yes; Fluid consistency: Thin  Diet effective now    Question Answer Comment  Room service appropriate? Yes   Fluid consistency: Thin      01/20/20 9562         Brief/Interim Summary: MACEN JOSLIN is a 54 y.o. male with a history of HTN, alcohol abuse, CVA 2007, cerebral hemorrhage 2014 who presented to [facility] ED with weakness and numbness in the left wrist/hand. He was evaluated by teleneurology who recommended neurology consult and MRI for which the patient was transferred to [facility]. This showed evidence of old right basal ganglia hemorrhage, no acute stroke and is read by radiology as consistent with multiple sclerosis. Neurology was consulted.  See below for further work-up and plan.  Discharge Diagnoses:  Principal Problem:   Wrist drop, left Active Problems:   History of Hemorrhagic stroke   Essential hypertension, malignant   Left arm weakness   Alcohol intoxication, uncomplicated (HCC)   Elevated AST (SGOT)   Alcohol abuse   Visual impairment   Left wrist drop   Left hand weakness and numbness - Appreciate neurology evaluation - CT head without acute vascular territory or cortically based infarction - CTA head and neck with no intracranial arterial occlusion or high-grade stenosis - MRI chest with normal  left brachial plexus - MRI brain/cervical spine with unchanged distribution white matter lesion, consistent with multiple sclerosis, no active demyelinating lesions, no demyelinating lesions of cervical spinal cord (Per neurology, thought to be more likely due to chronic small vessel disease) - EEG within normal limits, no seizures or epileptiform discharges - Echocardiogram no intracardiac source of embolism - Will need outpatient EMG/NCS - Continuing ASA, statin  - PT/OT consulted, spica splint placed. Outpatient OT ordered   Binocular visual impairment, decreased visual acuity: No explanatory lesion on MRI.  - Ophthalmology consulted for evaluation.  Dr. Jarvis Newcomer spoke with Dr. Allena Katz who recommended close outpatient follow up, advised to call his office at discharge for an appointment this week.  Alcohol abuse: EtOH level elevated at admission to 260.  -No signs or symptoms of withdrawal at this time  HTN - Restarted clonidine, start lisinopril    Abnormal TSH: 4.716 with normal free T4.  - Recheck as outpatient  Prediabetes - Outpatient follow up recommended  Hypokalemia - Replaced   Discharge Instructions  Discharge Instructions    Ambulatory referral to Neurology   Complete by: As directed    An appointment is requested in approximately: 1 week. Will need outpatient EMG in 1 week   Ambulatory referral to Occupational Therapy   Complete by: As directed    Diet - low sodium heart healthy   Complete by: As directed    Discharge instructions   Complete by: As directed    You were cared for by a hospitalist during  your hospital stay. If you have any questions about your discharge medications or the care you received while you were in the hospital after you are discharged, you can call the unit and ask to speak with the hospitalist on call if the hospitalist that took care of you is not available. Once you are discharged, your primary care physician will handle any further  medical issues. Please note that NO REFILLS for any discharge medications will be authorized once you are discharged, as it is imperative that you return to your primary care physician (or establish a relationship with a primary care physician if you do not have one) for your aftercare needs so that they can reassess your need for medications and monitor your lab values.   Increase activity slowly   Complete by: As directed      Allergies as of 01/23/2020      Reactions   Benadryl [diphenhydramine Hcl (sleep)] Itching, Rash      Medication List    TAKE these medications   aspirin 81 MG EC tablet Take 1 tablet (81 mg total) by mouth daily.   atorvastatin 40 MG tablet Commonly known as: LIPITOR Take 1 tablet (40 mg total) by mouth daily.   cloNIDine 0.2 MG tablet Commonly known as: CATAPRES Take 1 tablet (0.2 mg total) by mouth 2 (two) times daily. What changed: medication strength   folic acid 1 MG tablet Commonly known as: FOLVITE Take 1 tablet (1 mg total) by mouth daily.   lisinopril 20 MG tablet Commonly known as: ZESTRIL Take 1 tablet (20 mg total) by mouth daily.   thiamine 100 MG tablet Take 1 tablet (100 mg total) by mouth daily.   Vitamin D (Ergocalciferol) 1.25 MG (50000 UNIT) Caps capsule Commonly known as: DRISDOL Take 1 capsule (50,000 Units total) by mouth every 7 (seven) days. Start taking on: Jan 28, 2020      Follow-up Information    [facility]. Schedule an appointment as soon as possible for a visit in 1 week(s).   Specialty: Neurology Contact information: [address] [place] [address] [phone]       [facility] in [place] Follow up.   Why: You have been set up with [facility]. This agency will set you up with a primary Care Provider. Your appointment is on Friday May 7th at 10am. If you can not keep this appointment please call the number above. The agency will call you.  Contact  information: [phone]         Allergies  Allergen Reactions  . Benadryl [Diphenhydramine Hcl (Sleep)] Itching and Rash    Consultations:  Neurology    Procedures/Studies: EEG  Result Date: 01/22/2020 Charlsie Quest, MD     01/22/2020  6:21 PM Patient Name: JERRIT HOREN MRN: [id number] Epilepsy Attending: Charlsie Quest Referring Physician/Provider: Dr Hazeline Junker Date: 01/22/2020 Duration: 25.29 mins Patient history: 54 year old male presenting to the ED with a 4-day history of left hand numbness in conjunction with weakness of his left wrist and fingers. eports similar episode few months ago which resolved. EEG to evaluate for seizure. Level of alertness: awake, asleep AEDs during EEG study: None Technical aspects: This EEG study was done with scalp electrodes positioned according to the 10-20 International system of electrode placement. Electrical activity was acquired at a sampling rate of  and reviewed with a high frequency filter of  and a low frequency filter of . EEG data were recorded  continuously and digitally stored. DESCRIPTION: The posterior dominant rhythm consists of 9-10 Hz activity of moderate voltage (25-35 uV) seen predominantly in posterior head regions, symmetric and reactive to eye opening and eye closing. Sleep was characterized by vertex waves, sleep spindles (12-14hz ), maixmal frontocentral. Hyperventilation and photic stimulation were not performed. IMPRESSION: This study is within normal limits. No seizures or epileptiform discharges were seen throughout the recording. Priyanka Annabelle Harman   CT ANGIO HEAD W OR WO CONTRAST  Result Date: 01/20/2020 CLINICAL DATA:  Left hand weakness EXAM: CT ANGIOGRAPHY HEAD AND NECK TECHNIQUE: Multidetector CT imaging of the head and neck was performed using the standard protocol during bolus administration of intravenous contrast. Multiplanar CT image reconstructions and MIPs were obtained to evaluate the vascular  anatomy. Carotid stenosis measurements (when applicable) are obtained utilizing NASCET criteria, using the distal internal carotid diameter as the denominator. CONTRAST:  75mL OMNIPAQUE IOHEXOL 350 MG/ML SOLN COMPARISON:  None. FINDINGS: CT HEAD FINDINGS Brain: There is no mass, hemorrhage or extra-axial collection. There is generalized atrophy without lobar predilection. There is an old right subinsular infarct. There is hypoattenuation of the periventricular white matter, most commonly indicating chronic ischemic microangiopathy. Skull: The visualized skull base, calvarium and extracranial soft tissues are normal. Sinuses/Orbits: No fluid levels or advanced mucosal thickening of the visualized paranasal sinuses. No mastoid or middle ear effusion. The orbits are normal. CTA NECK FINDINGS SKELETON: There is no bony spinal canal stenosis. No lytic or blastic lesion. OTHER NECK: Normal pharynx, larynx and major salivary glands. No cervical lymphadenopathy. Unremarkable thyroid gland. UPPER CHEST: Emphysema AORTIC ARCH: There is no calcific atherosclerosis of the aortic arch. There is no aneurysm, dissection or hemodynamically significant stenosis of the visualized portion of the aorta. Conventional 3 vessel aortic branching pattern. The visualized proximal subclavian arteries are widely patent. RIGHT CAROTID SYSTEM: No dissection, occlusion or aneurysm. There is mixed density atherosclerosis extending into the proximal ICA, resulting in less than 50% stenosis. LEFT CAROTID SYSTEM: No dissection, occlusion or aneurysm. There is mixed density atherosclerosis extending into the proximal ICA, resulting in less than 50% stenosis. VERTEBRAL ARTERIES: Left dominant configuration. Both origins are clearly patent. There is no dissection, occlusion or flow-limiting stenosis to the skull base (V1-V3 segments). CTA HEAD FINDINGS POSTERIOR CIRCULATION: --Vertebral arteries: Normal V4 segments. --Posterior inferior cerebellar  arteries (PICA): Patent origins from the vertebral arteries. --Anterior inferior cerebellar arteries (AICA): Patent origins from the basilar artery. --Basilar artery: Diminutive --Superior cerebellar arteries: Normal. --Posterior cerebral arteries: Normal. Both are predominantly supplied by the posterior communicating arteries (p-comm). ANTERIOR CIRCULATION: --Intracranial internal carotid arteries: Normal. --Anterior cerebral arteries (ACA): Normal. Both A1 segments are present. Patent anterior communicating artery (a-comm). --Middle cerebral arteries (MCA): Normal. VENOUS SINUSES: As permitted by contrast timing, patent. ANATOMIC VARIANTS: Fetal origins of both posterior cerebral arteries. Review of the MIP images confirms the above findings. IMPRESSION: 1. No intracranial arterial occlusion or high-grade stenosis. 2. Bilateral mixed density atherosclerosis of the proximal internal carotid arteries without hemodynamically significant stenosis by NASCET criteria. 3. Chronic small vessel ischemia. 4. Emphysema (ICD10-J43.9). Electronically Signed   By: Deatra Robinson M.D.   On: 01/20/2020 00:27   CT Head Wo Contrast  Result Date: 01/19/2020 CLINICAL DATA:  Unable to move left hand since yesterday EXAM: CT HEAD WITHOUT CONTRAST TECHNIQUE: Contiguous axial images were obtained from the base of the skull through the vertex without intravenous contrast. COMPARISON:  MRI 10/23/2019 FINDINGS: Brain: Stable region of gliosis in the right insula/external capsule. New  hypoattenuating focus in the left corona radiata may correspond to an evolving lacunar infarct seen on comparison MRI. Previously seen thalamic diffusion restriction is without CT correlate. No convincing CT evidence of vascular territory or cortically based acute infarction. No acute hemorrhage, hydrocephalus, extra-axial collection or mass lesion/mass effect. Symmetric prominence of the cisterns and sulci compatible with parenchymal volume loss. Asymmetric  dilatation of the right lateral ventricle likely related to ex vacuo dilatation. Patchy areas of white matter hypoattenuation are most compatible with chronic microvascular angiopathy. Vascular: Atherosclerotic calcification of the carotid siphons. No hyperdense vessel. Skull: No calvarial fracture or suspicious osseous lesion. No scalp swelling or hematoma. Sinuses/Orbits: Paranasal sinuses and mastoid air cells are predominantly clear. Included orbital structures are unremarkable. Other: None IMPRESSION: 1. No convincing CT evidence of acute vascular territory or cortically based infarction. If there is persisting clinical concern, MRI could be obtained. 2. Tiny hypoattenuating focus in the left corona radiata may correspond to evolution of a lacunar infarct seen on comparison MRI. Previously seen thalamic diffusion restriction is without CT correlate. Stable region of gliosis in the right insula/external capsule. 3. Stable parenchymal volume loss and chronic microvascular angiopathy. Electronically Signed   By: Kreg Shropshire M.D.   On: 01/19/2020 23:12   CT ANGIO NECK W OR WO CONTRAST  Result Date: 01/20/2020 CLINICAL DATA:  Left hand weakness EXAM: CT ANGIOGRAPHY HEAD AND NECK TECHNIQUE: Multidetector CT imaging of the head and neck was performed using the standard protocol during bolus administration of intravenous contrast. Multiplanar CT image reconstructions and MIPs were obtained to evaluate the vascular anatomy. Carotid stenosis measurements (when applicable) are obtained utilizing NASCET criteria, using the distal internal carotid diameter as the denominator. CONTRAST:  62mL OMNIPAQUE IOHEXOL 350 MG/ML SOLN COMPARISON:  None. FINDINGS: CT HEAD FINDINGS Brain: There is no mass, hemorrhage or extra-axial collection. There is generalized atrophy without lobar predilection. There is an old right subinsular infarct. There is hypoattenuation of the periventricular white matter, most commonly indicating  chronic ischemic microangiopathy. Skull: The visualized skull base, calvarium and extracranial soft tissues are normal. Sinuses/Orbits: No fluid levels or advanced mucosal thickening of the visualized paranasal sinuses. No mastoid or middle ear effusion. The orbits are normal. CTA NECK FINDINGS SKELETON: There is no bony spinal canal stenosis. No lytic or blastic lesion. OTHER NECK: Normal pharynx, larynx and major salivary glands. No cervical lymphadenopathy. Unremarkable thyroid gland. UPPER CHEST: Emphysema AORTIC ARCH: There is no calcific atherosclerosis of the aortic arch. There is no aneurysm, dissection or hemodynamically significant stenosis of the visualized portion of the aorta. Conventional 3 vessel aortic branching pattern. The visualized proximal subclavian arteries are widely patent. RIGHT CAROTID SYSTEM: No dissection, occlusion or aneurysm. There is mixed density atherosclerosis extending into the proximal ICA, resulting in less than 50% stenosis. LEFT CAROTID SYSTEM: No dissection, occlusion or aneurysm. There is mixed density atherosclerosis extending into the proximal ICA, resulting in less than 50% stenosis. VERTEBRAL ARTERIES: Left dominant configuration. Both origins are clearly patent. There is no dissection, occlusion or flow-limiting stenosis to the skull base (V1-V3 segments). CTA HEAD FINDINGS POSTERIOR CIRCULATION: --Vertebral arteries: Normal V4 segments. --Posterior inferior cerebellar arteries (PICA): Patent origins from the vertebral arteries. --Anterior inferior cerebellar arteries (AICA): Patent origins from the basilar artery. --Basilar artery: Diminutive --Superior cerebellar arteries: Normal. --Posterior cerebral arteries: Normal. Both are predominantly supplied by the posterior communicating arteries (p-comm). ANTERIOR CIRCULATION: --Intracranial internal carotid arteries: Normal. --Anterior cerebral arteries (ACA): Normal. Both A1 segments are present. Patent anterior  communicating artery (a-comm). --Middle cerebral arteries (MCA): Normal. VENOUS SINUSES: As permitted by contrast timing, patent. ANATOMIC VARIANTS: Fetal origins of both posterior cerebral arteries. Review of the MIP images confirms the above findings. IMPRESSION: 1. No intracranial arterial occlusion or high-grade stenosis. 2. Bilateral mixed density atherosclerosis of the proximal internal carotid arteries without hemodynamically significant stenosis by NASCET criteria. 3. Chronic small vessel ischemia. 4. Emphysema (ICD10-J43.9). Electronically Signed   By: Deatra Robinson M.D.   On: 01/20/2020 00:27   MR BRAIN W WO CONTRAST  Result Date: 01/21/2020 CLINICAL DATA:  Left hand weakness EXAM: MRI HEAD WITHOUT AND WITH CONTRAST MRI CERVICAL SPINE WITHOUT AND WITH CONTRAST TECHNIQUE: Multiplanar, multiecho pulse sequences of the brain and surrounding structures, and cervical spine, to include the craniocervical junction and cervicothoracic junction, were obtained without and with intravenous contrast. CONTRAST:  7.42mL GADAVIST GADOBUTROL 1 MMOL/ML IV SOLN COMPARISON:  10/23/2019 brain MRI FINDINGS: MRI HEAD FINDINGS Brain: No acute infarct, acute hemorrhage or extra-axial collection. There are numerous white matter lesions oriented perpendicularly to the long axis of the lateral ventricles, unchanged. There is atrophy greater than expected for age. There is an old infarct of the right basal ganglia. No chronic microhemorrhage. Normal midline structures. There is no abnormal contrast enhancement. Vascular: Normal flow voids. Skull and upper cervical spine: Normal marrow signal. Sinuses/Orbits: Negative. Other: None. MRI CERVICAL SPINE FINDINGS Alignment: Mild straightening of normal cervical lordosis. No static subluxation. Vertebrae: No fracture, evidence of discitis, or bone lesion. Cord: Normal signal and morphology. Posterior Fossa, vertebral arteries, paraspinal tissues: Negative Disc levels: C1-2: Unremarkable.  C2-3: Normal disc space and facet joints. There is no spinal canal stenosis. No neural foraminal stenosis. C3-4: Bilateral uncovertebral hypertrophy with small disc bulge. Mild spinal canal stenosis. Moderate bilateral neural foraminal stenosis. C4-5: Small central disc protrusion. Normal facets. Mild spinal canal stenosis. No neural foraminal stenosis. C5-6: Small central disc protrusion with bilateral uncovertebral spurring. Mild spinal canal stenosis. Moderate right neural foraminal stenosis. C6-7: Disc desiccation without herniation. There is no spinal canal stenosis. No neural foraminal stenosis. C7-T1: Normal disc space and facet joints. There is no spinal canal stenosis. No neural foraminal stenosis. No abnormal contrast enhancement. IMPRESSION: 1. Unchanged distribution of white matter lesions in a pattern consistent with multiple sclerosis. No active demyelinating lesions. 2. No demyelinating lesions of the cervical spinal cord. 3. Unchanged appearance of cervical degenerative disc disease with mild spinal canal stenosis at C3-4, C4-5 and C5-6. 4. Moderate bilateral C3-4 and right C5-6 neural foraminal stenosis. Electronically Signed   By: Deatra Robinson M.D.   On: 01/21/2020 00:40   MR CHEST W WO CONTRAST  Result Date: 01/21/2020 CLINICAL DATA:  Possible radial nerve palsy. No left shoulder or upper arm trauma. Was unable to move the left arm. EXAM: MRI BRACHIAL PLEXUS WITHOUT CONTRAST TECHNIQUE: Multiplanar, multiecho pulse sequences of the neck and surrounding structures were obtained without intravenous contrast. The field of view was focused on the leftbrachial plexus from the neural foramina to the axilla. CONTRAST:  7mL GADAVIST GADOBUTROL 1 MMOL/ML IV SOLN COMPARISON:  MRI cervical spine 01/21/2020 FINDINGS: Patient motion degrades image quality limiting evaluation. Spinal cord Normal caliber and signal of visualized spinal cord. Brachial plexus Roots: Normal. Trunks: Normal. Divisions: Normal.  Cords: Normal. Branches: Normal. Muscles and tendons Normal. Bones C3-4: Mild broad-based disc bulge. Bilateral uncovertebral degenerative changes with bilateral foraminal stenosis. Mild spinal stenosis. C4-5: Small central disc protrusion.  Mild spinal stenosis. C5-6: Broad-based disc bulge with a small  central disc protrusion. Moderate right foraminal stenosis. C7 transverse processes: Normal. Marrow signal: Normal. Other findings None IMPRESSION: 1. Normal left brachial plexus. 2. Cervical spine spondylosis as detailed above, but better characterized on MRI of the cervical spine performed earlier same day. Electronically Signed   By: Elige Ko   On: 01/21/2020 20:43   MR CERVICAL SPINE W WO CONTRAST  Result Date: 01/21/2020 CLINICAL DATA:  Left hand weakness EXAM: MRI HEAD WITHOUT AND WITH CONTRAST MRI CERVICAL SPINE WITHOUT AND WITH CONTRAST TECHNIQUE: Multiplanar, multiecho pulse sequences of the brain and surrounding structures, and cervical spine, to include the craniocervical junction and cervicothoracic junction, were obtained without and with intravenous contrast. CONTRAST:  7.46mL GADAVIST GADOBUTROL 1 MMOL/ML IV SOLN COMPARISON:  10/23/2019 brain MRI FINDINGS: MRI HEAD FINDINGS Brain: No acute infarct, acute hemorrhage or extra-axial collection. There are numerous white matter lesions oriented perpendicularly to the long axis of the lateral ventricles, unchanged. There is atrophy greater than expected for age. There is an old infarct of the right basal ganglia. No chronic microhemorrhage. Normal midline structures. There is no abnormal contrast enhancement. Vascular: Normal flow voids. Skull and upper cervical spine: Normal marrow signal. Sinuses/Orbits: Negative. Other: None. MRI CERVICAL SPINE FINDINGS Alignment: Mild straightening of normal cervical lordosis. No static subluxation. Vertebrae: No fracture, evidence of discitis, or bone lesion. Cord: Normal signal and morphology. Posterior Fossa,  vertebral arteries, paraspinal tissues: Negative Disc levels: C1-2: Unremarkable. C2-3: Normal disc space and facet joints. There is no spinal canal stenosis. No neural foraminal stenosis. C3-4: Bilateral uncovertebral hypertrophy with small disc bulge. Mild spinal canal stenosis. Moderate bilateral neural foraminal stenosis. C4-5: Small central disc protrusion. Normal facets. Mild spinal canal stenosis. No neural foraminal stenosis. C5-6: Small central disc protrusion with bilateral uncovertebral spurring. Mild spinal canal stenosis. Moderate right neural foraminal stenosis. C6-7: Disc desiccation without herniation. There is no spinal canal stenosis. No neural foraminal stenosis. C7-T1: Normal disc space and facet joints. There is no spinal canal stenosis. No neural foraminal stenosis. No abnormal contrast enhancement. IMPRESSION: 1. Unchanged distribution of white matter lesions in a pattern consistent with multiple sclerosis. No active demyelinating lesions. 2. No demyelinating lesions of the cervical spinal cord. 3. Unchanged appearance of cervical degenerative disc disease with mild spinal canal stenosis at C3-4, C4-5 and C5-6. 4. Moderate bilateral C3-4 and right C5-6 neural foraminal stenosis. Electronically Signed   By: Deatra Robinson M.D.   On: 01/21/2020 00:40   ECHOCARDIOGRAM COMPLETE BUBBLE STUDY  Result Date: 01/20/2020    ECHOCARDIOGRAM REPORT   Patient Name:   ANGLE KAREL Date of Exam: 01/20/2020 Medical Rec #:  [id number]          Height:       70.0 in Accession #:    [id number]         Weight:       170.0 lb Date of Birth:  1965/10/12          BSA:          1.948 m Patient Age:    53 years           BP:           161/103 mmHg Patient Gender: M                  HR:           97 bpm. [facility]am [facility] Procedure: 2D Echo and Saline Contrast Bubble Study Indications:  TIA (transient ischemic attack) 435.9 / G45.9  History:        Patient has no prior history of Echocardiogram  examinations.                 Risk Factors:Current Smoker and Hypertension. Alcohol abuse,                 Left arm weakness, IVH (intraventricular hemorrhage.  Sonographer:    Jeryl Columbia RDCS (AE) Referring Phys: Gomez Cleverly LAMA IMPRESSIONS  1. Left ventricular ejection fraction, by estimation, is 65 to 70%. The left ventricle has hyperdynamic function. The left ventricle has no regional wall motion abnormalities. There is moderate concentric left ventricular hypertrophy. Left ventricular diastolic parameters are consistent with Grade I diastolic dysfunction (impaired relaxation). Elevated left atrial pressure.  2. Right ventricular systolic function is normal. The right ventricular size is normal.  3. Left atrial size was mildly dilated.  4. The mitral valve is normal in structure. Trivial mitral valve regurgitation. No evidence of mitral stenosis.  5. The aortic valve is normal in structure. Aortic valve regurgitation is not visualized. No aortic stenosis is present.  6. The inferior vena cava is normal in size with greater than 50% respiratory variability, suggesting right atrial pressure of 3 mmHg. Conclusion(s)/Recommendation(s): No intracardiac source of embolism detected on this transthoracic study. A transesophageal echocardiogram is recommended to exclude cardiac source of embolism if clinically indicated. FINDINGS  Left Ventricle: Left ventricular ejection fraction, by estimation, is 65 to 70%. The left ventricle has hyperdynamic function. The left ventricle has no regional wall motion abnormalities. The left ventricular internal cavity size was normal in size. There is moderate concentric left ventricular hypertrophy. Left ventricular diastolic parameters are consistent with Grade I diastolic dysfunction (impaired relaxation). Elevated left atrial pressure. Right Ventricle: The right ventricular size is normal. No increase in right ventricular wall thickness. Right ventricular systolic function is  normal. Left Atrium: Left atrial size was mildly dilated. Right Atrium: Right atrial size was normal in size. Pericardium: There is no evidence of pericardial effusion. Mitral Valve: The mitral valve is normal in structure. Normal mobility of the mitral valve leaflets. Trivial mitral valve regurgitation. No evidence of mitral valve stenosis. Tricuspid Valve: The tricuspid valve is normal in structure. Tricuspid valve regurgitation is trivial. No evidence of tricuspid stenosis. Aortic Valve: The aortic valve is normal in structure. Aortic valve regurgitation is not visualized. No aortic stenosis is present. Pulmonic Valve: The pulmonic valve was normal in structure. Pulmonic valve regurgitation is not visualized. No evidence of pulmonic stenosis. Aorta: The aortic root is normal in size and structure. Venous: The inferior vena cava is normal in size with greater than 50% respiratory variability, suggesting right atrial pressure of 3 mmHg. IAS/Shunts: No atrial level shunt detected by color flow Doppler. Agitated saline contrast was given intravenously to evaluate for intracardiac shunting. Tobias Alexander MD Electronically signed by Tobias Alexander MD Signature Date/Time: 01/20/2020/12:42:07 PM    Final        Discharge Exam: Vitals:   01/22/20 2357 01/23/20 0735  BP:  (!) 174/109  Pulse:  67  Resp:  19  Temp: 98.9 F (37.2 C) 98.5 F (36.9 C)  SpO2:  93%    General: Pt is alert, awake, not in acute distress Cardiovascular: RRR, S1/S2 +, no edema Respiratory: CTA bilaterally, no wheezing, no rhonchi, no respiratory distress, no conversational dyspnea  Abdominal: Soft, NT, ND, bowel sounds + Extremities: no edema, no cyanosis, left wrist drop  and left hand grip weakness  Psych: Normal mood and affect, stable judgement and insight     The results of significant diagnostics from this hospitalization (including imaging, microbiology, ancillary and laboratory) are listed below for reference.      Microbiology: Recent Results (from the past 240 hour(s))  Respiratory Panel by RT PCR (Flu A&B, Covid) - Nasopharyngeal Swab     Status: None   Collection Time: 01/19/20 11:20 PM   Specimen: Nasopharyngeal Swab  Result Value Ref Range Status   SARS Coronavirus 2 by RT PCR NEGATIVE NEGATIVE Final    Comment: (NOTE) SARS-CoV-2 target nucleic acids are NOT DETECTED. The SARS-CoV-2 RNA is generally detectable in upper respiratoy specimens during the acute phase of infection. The lowest concentration of SARS-CoV-2 viral copies this assay can detect is 131 copies/mL. A negative result does not preclude SARS-Cov-2 infection and should not be used as the sole basis for treatment or other patient management decisions. A negative result may occur with  improper specimen collection/handling, submission of specimen other than nasopharyngeal swab, presence of viral mutation(s) within the areas targeted by this assay, and inadequate number of viral copies (<131 copies/mL). A negative result must be combined with clinical observations, patient history, and epidemiological information. The expected result is Negative. Fact Sheet for Patients:  https://www.moore.com/ Fact Sheet for Healthcare Providers:  https://www.young.biz/ This test is not yet ap proved or cleared by the [place] FDA and  has been authorized for detection and/or diagnosis of SARS-CoV-2 by FDA under an Emergency Use Authorization (EUA). This EUA will remain  in effect (meaning this test can be used) for the duration of the COVID-19 declaration under Section 564(b)(1) of the Act, 21 U.S.C. section 360bbb-3(b)(1), unless the authorization is terminated or revoked sooner.    Influenza A by PCR NEGATIVE NEGATIVE Final   Influenza B by PCR NEGATIVE NEGATIVE Final    Comment: (NOTE) The Xpert Xpress SARS-CoV-2/FLU/RSV assay is intended as an aid in  the diagnosis of influenza from  Nasopharyngeal swab specimens and  should not be used as a sole basis for treatment. Nasal washings and  aspirates are unacceptable for Xpert Xpress SARS-CoV-2/FLU/RSV  testing. Fact Sheet for Patients: https://www.moore.com/ Fact Sheet for Healthcare Providers: https://www.young.biz/ This test is not yet approved or cleared by the [place] FDA and  has been authorized for detection and/or diagnosis of SARS-CoV-2 by  FDA under an Emergency Use Authorization (EUA). This EUA will remain  in effect (meaning this test can be used) for the duration of the  Covid-19 declaration under Section 564(b)(1) of the Act, 21  U.S.C. section 360bbb-3(b)(1), unless the authorization is  terminated or revoked. Performed at [facility], [address] [place], [place] [address]      Labs: BNP (last 3 results) No results for input(s): BNP in the last 8760 hours. Basic Metabolic Panel: Recent Labs  Lab 01/19/20 2307 01/20/20 0449 01/22/20 0401 01/23/20 0409  NA 137 138 131* 132*  K 3.5 3.9 3.5 3.4*  CL 102 105 97* 99  CO2 24 23 25 25   GLUCOSE 97 71 91 103*  BUN 8 8 8 10   CREATININE 0.89 0.86 0.89 0.94  CALCIUM 8.2* 8.1* 8.2* 8.4*  MG  --  1.4*  --   --   PHOS  --  3.6  --   --    Liver Function Tests: Recent Labs  Lab 01/19/20 2307 01/20/20 0449 01/22/20 0401 01/23/20 0409  AST 49* 49* 32 47*  ALT  15 15 13 17   ALKPHOS 68 67 55 59  BILITOT 0.8 0.9 1.7* 1.2  PROT 8.1 7.8 7.8 7.6  ALBUMIN 3.4* 3.3* 2.8* 2.8*   No results for input(s): LIPASE, AMYLASE in the last 168 hours. No results for input(s): AMMONIA in the last 168 hours. CBC: Recent Labs  Lab 01/19/20 2230 01/20/20 0449 01/20/20 0450  WBC 5.6 8.0  --   NEUTROABS 2.2  --   --   HGB 14.0 14.1  --   HCT 41.0 41.9 40.8  MCV 104.3* 102.9*  --   PLT 148* 145*  --    Cardiac Enzymes: No results for input(s): CKTOTAL, CKMB, CKMBINDEX, TROPONINI in the last 168  hours. BNP: Invalid input(s): POCBNP CBG: Recent Labs  Lab 01/19/20 2347 01/21/20 1131 01/21/20 1608  GLUCAP 92 86 122*   D-Dimer No results for input(s): DDIMER in the last 72 hours. Hgb A1c No results for input(s): HGBA1C in the last 72 hours. Lipid Profile No results for input(s): CHOL, HDL, LDLCALC, TRIG, CHOLHDL, LDLDIRECT in the last 72 hours. Thyroid function studies No results for input(s): TSH, T4TOTAL, T3FREE, THYROIDAB in the last 72 hours.  Invalid input(s): FREET3 Anemia work up No results for input(s): VITAMINB12, FOLATE, FERRITIN, TIBC, IRON, RETICCTPCT in the last 72 hours. Urinalysis    Component Value Date/Time   COLORURINE COLORLESS (A) 01/19/2020 2319   APPEARANCEUR CLEAR 01/19/2020 2319   LABSPEC 1.000 (L) 01/19/2020 2319   PHURINE 6.0 01/19/2020 2319   GLUCOSEU NEGATIVE 01/19/2020 2319   HGBUR NEGATIVE 01/19/2020 2319   BILIRUBINUR NEGATIVE 01/19/2020 2319   KETONESUR NEGATIVE 01/19/2020 2319   PROTEINUR NEGATIVE 01/19/2020 2319   UROBILINOGEN 0.2 02/08/2013 1605   NITRITE NEGATIVE 01/19/2020 2319   LEUKOCYTESUR NEGATIVE 01/19/2020 2319   Sepsis Labs Invalid input(s): PROCALCITONIN,  WBC,  LACTICIDVEN Microbiology Recent Results (from the past 240 hour(s))  Respiratory Panel by RT PCR (Flu A&B, Covid) - Nasopharyngeal Swab     Status: None   Collection Time: 01/19/20 11:20 PM   Specimen: Nasopharyngeal Swab  Result Value Ref Range Status   SARS Coronavirus 2 by RT PCR NEGATIVE NEGATIVE Final    Comment: (NOTE) SARS-CoV-2 target nucleic acids are NOT DETECTED. The SARS-CoV-2 RNA is generally detectable in upper respiratoy specimens during the acute phase of infection. The lowest concentration of SARS-CoV-2 viral copies this assay can detect is 131 copies/mL. A negative result does not preclude SARS-Cov-2 infection and should not be used as the sole basis for treatment or other patient management decisions. A negative result may occur with   improper specimen collection/handling, submission of specimen other than nasopharyngeal swab, presence of viral mutation(s) within the areas targeted by this assay, and inadequate number of viral copies (<131 copies/mL). A negative result must be combined with clinical observations, patient history, and epidemiological information. The expected result is Negative. Fact Sheet for Patients:  PinkCheek.be Fact Sheet for Healthcare Providers:  GravelBags.it This test is not yet ap proved or cleared by the [place] FDA and  has been authorized for detection and/or diagnosis of SARS-CoV-2 by FDA under an Emergency Use Authorization (EUA). This EUA will remain  in effect (meaning this test can be used) for the duration of the COVID-19 declaration under Section 564(b)(1) of the Act, 21 U.S.C. section 360bbb-3(b)(1), unless the authorization is terminated or revoked sooner.    Influenza A by PCR NEGATIVE NEGATIVE Final   Influenza B by PCR NEGATIVE NEGATIVE Final    Comment: (NOTE) The  Xpert Xpress SARS-CoV-2/FLU/RSV assay is intended as an aid in  the diagnosis of influenza from Nasopharyngeal swab specimens and  should not be used as a sole basis for treatment. Nasal washings and  aspirates are unacceptable for Xpert Xpress SARS-CoV-2/FLU/RSV  testing. Fact Sheet for Patients: https://www.moore.com/ Fact Sheet for Healthcare Providers: https://www.young.biz/ This test is not yet approved or cleared by the [place] FDA and  has been authorized for detection and/or diagnosis of SARS-CoV-2 by  FDA under an Emergency Use Authorization (EUA). This EUA will remain  in effect (meaning this test can be used) for the duration of the  Covid-19 declaration under Section 564(b)(1) of the Act, 21  U.S.C. section 360bbb-3(b)(1), unless the authorization is  terminated or revoked. Performed at  [facility], [address] [place], [place] [address]      Patient was seen and examined on the day of discharge and was found to be in stable condition. Time coordinating discharge: 35 minutes including assessment and coordination of care, as well as examination of the patient.   SIGNED:  Noralee Stain, DO [facility] 01/23/2020, 10:24 AM

## 2020-01-23 NOTE — Progress Notes (Signed)
   01/23/20 0900  OT Visit Information  Last OT Received On 01/23/20  Assistance Needed +1  History of Present Illness Pt is a 54 yo male presenting with numbness and weakness of L wrist. PMH includes: HTN, CVAs, R basal ganglia ICH, and alcohol abuse.  Precautions  Precautions Fall  Precaution Comments monitor 02, low vision  Pain Assessment  Pain Assessment No/denies pain  Cognition  Arousal/Alertness Awake/alert  Behavior During Therapy WFL for tasks assessed/performed  Overall Cognitive Status Within Functional Limits for tasks assessed  Upper Extremity Assessment  Upper Extremity Assessment LUE deficits/detail  LUE Deficits / Details improved ability to use digits for grasp and demonstrating active wrist flexion, worked on wrist extension with facilitation and reinforced use of wrist brace  LUE Sensation decreased light touch (remains consistent from eval)  LUE Coordination decreased fine motor  ADL  Overall ADL's  Needs assistance/impaired  Grooming Standing;Supervision/safety  Upper Body Dressing  Set up;Sitting  Lower Body Dressing Set up;Sitting/lateral leans  Lower Body Dressing Details (indicate cue type and reason) educated in one hand method for donning socks, fatigues easily when bending  Toilet Transfer Supervision/safety;Ambulation  Toileting- Architect and Hygiene Supervision/safety;Sit to/from stand  Functional mobility during ADLs Supervision/safety  Bed Mobility  Overal bed mobility Modified Independent  General bed mobility comments HOB up   Balance  Overall balance assessment Needs assistance  Sitting balance-Leahy Scale Good  Standing balance-Leahy Scale Fair  Transfers  Overall transfer level Needs assistance  Equipment used None  Transfers Sit to/from Stand  Sit to Stand Supervision  General transfer comment supervision for safety  OT - End of Session  Activity Tolerance Patient tolerated treatment well  Patient left in bed;with call  bell/phone within reach  OT Assessment/Plan  OT Plan Discharge plan remains appropriate  OT Visit Diagnosis Other abnormalities of gait and mobility (R26.89);Muscle weakness (generalized) (M62.81);Low vision, both eyes (H54.2)  OT Frequency (ACUTE ONLY) Min 2X/week  Follow Up Recommendations Outpatient OT  OT Equipment None recommended by OT  AM-PAC OT "6 Clicks" Daily Activity Outcome Measure (Version 2)  Help from another person eating meals? 3  Help from another person taking care of personal grooming? 3  Help from another person toileting, which includes using toliet, bedpan, or urinal? 3  Help from another person bathing (including washing, rinsing, drying)? 3  Help from another person to put on and taking off regular upper body clothing? 4  Help from another person to put on and taking off regular lower body clothing? 4  6 Click Score 20  OT Goal Progression  Progress towards OT goals Progressing toward goals  Acute Rehab OT Goals  Patient Stated Goal return home  OT Goal Formulation With patient  Time For Goal Achievement 02/04/20  Potential to Achieve Goals Good  OT Time Calculation  OT Start Time (ACUTE ONLY) 0854  OT Stop Time (ACUTE ONLY) 0911  OT Time Calculation (min) 17 min  OT General Charges  $OT Visit 1 Visit  OT Treatments  $Self Care/Home Management  8-22 mins  Martie Round, OTR/L Acute Rehabilitation Services Pager: [phone] Office: [phone]

## 2020-04-14 ENCOUNTER — Ambulatory Visit: Payer: MEDICAID | Admitting: Neurology

## 2020-04-14 ENCOUNTER — Encounter: Payer: Self-pay | Admitting: Neurology

## 2020-04-14 ENCOUNTER — Telehealth: Payer: Self-pay

## 2020-04-14 NOTE — Telephone Encounter (Signed)
Patient no-showed today's appointment with MD 

## 2023-05-25 ENCOUNTER — Observation Stay (HOSPITAL_COMMUNITY): Payer: MEDICAID

## 2023-05-25 ENCOUNTER — Encounter (HOSPITAL_COMMUNITY): Payer: Self-pay

## 2023-05-25 ENCOUNTER — Inpatient Hospital Stay (HOSPITAL_COMMUNITY)
Admission: EM | Admit: 2023-05-25 | Discharge: 2023-05-28 | DRG: 640 | Payer: MEDICAID | Attending: Internal Medicine | Admitting: Internal Medicine

## 2023-05-25 ENCOUNTER — Emergency Department (HOSPITAL_COMMUNITY): Payer: MEDICAID

## 2023-05-25 ENCOUNTER — Other Ambulatory Visit: Payer: Self-pay

## 2023-05-25 ENCOUNTER — Inpatient Hospital Stay (HOSPITAL_COMMUNITY): Payer: MEDICAID

## 2023-05-25 DIAGNOSIS — Z7951 Long term (current) use of inhaled steroids: Secondary | ICD-10-CM

## 2023-05-25 DIAGNOSIS — Z888 Allergy status to other drugs, medicaments and biological substances status: Secondary | ICD-10-CM | POA: Diagnosis not present

## 2023-05-25 DIAGNOSIS — E038 Other specified hypothyroidism: Secondary | ICD-10-CM | POA: Diagnosis present

## 2023-05-25 DIAGNOSIS — G9341 Metabolic encephalopathy: Secondary | ICD-10-CM | POA: Diagnosis present

## 2023-05-25 DIAGNOSIS — R680 Hypothermia, not associated with low environmental temperature: Secondary | ICD-10-CM | POA: Diagnosis present

## 2023-05-25 DIAGNOSIS — I130 Hypertensive heart and chronic kidney disease with heart failure and stage 1 through stage 4 chronic kidney disease, or unspecified chronic kidney disease: Secondary | ICD-10-CM | POA: Diagnosis present

## 2023-05-25 DIAGNOSIS — I509 Heart failure, unspecified: Secondary | ICD-10-CM | POA: Diagnosis not present

## 2023-05-25 DIAGNOSIS — E871 Hypo-osmolality and hyponatremia: Secondary | ICD-10-CM | POA: Diagnosis not present

## 2023-05-25 DIAGNOSIS — R4182 Altered mental status, unspecified: Secondary | ICD-10-CM | POA: Diagnosis not present

## 2023-05-25 DIAGNOSIS — N179 Acute kidney failure, unspecified: Secondary | ICD-10-CM | POA: Diagnosis present

## 2023-05-25 DIAGNOSIS — N189 Chronic kidney disease, unspecified: Secondary | ICD-10-CM | POA: Diagnosis present

## 2023-05-25 DIAGNOSIS — Z7902 Long term (current) use of antithrombotics/antiplatelets: Secondary | ICD-10-CM

## 2023-05-25 DIAGNOSIS — Z7982 Long term (current) use of aspirin: Secondary | ICD-10-CM | POA: Diagnosis not present

## 2023-05-25 DIAGNOSIS — G40909 Epilepsy, unspecified, not intractable, without status epilepticus: Secondary | ICD-10-CM | POA: Diagnosis present

## 2023-05-25 DIAGNOSIS — F17210 Nicotine dependence, cigarettes, uncomplicated: Secondary | ICD-10-CM | POA: Diagnosis present

## 2023-05-25 DIAGNOSIS — I5032 Chronic diastolic (congestive) heart failure: Secondary | ICD-10-CM | POA: Diagnosis present

## 2023-05-25 DIAGNOSIS — E162 Hypoglycemia, unspecified: Principal | ICD-10-CM

## 2023-05-25 DIAGNOSIS — Z8249 Family history of ischemic heart disease and other diseases of the circulatory system: Secondary | ICD-10-CM | POA: Diagnosis not present

## 2023-05-25 DIAGNOSIS — E860 Dehydration: Secondary | ICD-10-CM | POA: Diagnosis present

## 2023-05-25 DIAGNOSIS — Z8673 Personal history of transient ischemic attack (TIA), and cerebral infarction without residual deficits: Secondary | ICD-10-CM

## 2023-05-25 DIAGNOSIS — E222 Syndrome of inappropriate secretion of antidiuretic hormone: Secondary | ICD-10-CM | POA: Diagnosis present

## 2023-05-25 DIAGNOSIS — Z79890 Hormone replacement therapy (postmenopausal): Secondary | ICD-10-CM | POA: Diagnosis not present

## 2023-05-25 DIAGNOSIS — Z79899 Other long term (current) drug therapy: Secondary | ICD-10-CM | POA: Diagnosis not present

## 2023-05-25 DIAGNOSIS — I10 Essential (primary) hypertension: Secondary | ICD-10-CM | POA: Diagnosis present

## 2023-05-25 DIAGNOSIS — R569 Unspecified convulsions: Secondary | ICD-10-CM | POA: Diagnosis not present

## 2023-05-25 DIAGNOSIS — F1010 Alcohol abuse, uncomplicated: Secondary | ICD-10-CM

## 2023-05-25 DIAGNOSIS — T68XXXA Hypothermia, initial encounter: Secondary | ICD-10-CM

## 2023-05-25 LAB — URINALYSIS, ROUTINE W REFLEX MICROSCOPIC
Bilirubin Urine: NEGATIVE
Glucose, UA: 50 mg/dL — AB
Hgb urine dipstick: NEGATIVE
Ketones, ur: NEGATIVE mg/dL
Leukocytes,Ua: NEGATIVE
Nitrite: NEGATIVE
Protein, ur: 100 mg/dL — AB
Specific Gravity, Urine: 1.014 (ref 1.005–1.030)
pH: 6 (ref 5.0–8.0)

## 2023-05-25 LAB — COMPREHENSIVE METABOLIC PANEL
ALT: 18 U/L (ref 0–44)
AST: 30 U/L (ref 15–41)
Albumin: 3.4 g/dL — ABNORMAL LOW (ref 3.5–5.0)
Alkaline Phosphatase: 71 U/L (ref 38–126)
Anion gap: 10 (ref 5–15)
BUN: 32 mg/dL — ABNORMAL HIGH (ref 6–20)
CO2: 24 mmol/L (ref 22–32)
Calcium: 9.3 mg/dL (ref 8.9–10.3)
Chloride: 95 mmol/L — ABNORMAL LOW (ref 98–111)
Creatinine, Ser: 1.67 mg/dL — ABNORMAL HIGH (ref 0.61–1.24)
GFR, Estimated: 48 mL/min — ABNORMAL LOW (ref >60)
Glucose, Bld: 93 mg/dL (ref 70–99)
Potassium: 4.6 mmol/L (ref 3.5–5.1)
Sodium: 129 mmol/L — ABNORMAL LOW (ref 135–145)
Total Bilirubin: 0.7 mg/dL (ref 0.3–1.2)
Total Protein: 9.3 g/dL — ABNORMAL HIGH (ref 6.5–8.1)

## 2023-05-25 LAB — LACTIC ACID, PLASMA
Lactic Acid, Venous: 1 mmol/L (ref 0.5–1.9)
Lactic Acid, Venous: 0.9 mmol/L (ref 0.5–1.9)

## 2023-05-25 LAB — CBC WITH DIFFERENTIAL/PLATELET
Abs Immature Granulocytes: 0.05 10*3/uL (ref 0.00–0.07)
Basophils Absolute: 0.1 10*3/uL (ref 0.0–0.1)
Basophils Relative: 1 %
Eosinophils Absolute: 0.2 10*3/uL (ref 0.0–0.5)
Eosinophils Relative: 2 %
HCT: 41.7 % (ref 39.0–52.0)
Hemoglobin: 13.7 g/dL (ref 13.0–17.0)
Immature Granulocytes: 1 %
Lymphocytes Relative: 27 %
Lymphs Abs: 2.8 10*3/uL (ref 0.7–4.0)
MCH: 32.4 pg (ref 26.0–34.0)
MCHC: 32.9 g/dL (ref 30.0–36.0)
MCV: 98.6 fL (ref 80.0–100.0)
Monocytes Absolute: 0.6 10*3/uL (ref 0.1–1.0)
Monocytes Relative: 6 %
Neutro Abs: 6.6 10*3/uL (ref 1.7–7.7)
Neutrophils Relative %: 63 %
Platelets: 283 10*3/uL (ref 150–400)
RBC: 4.23 MIL/uL (ref 4.22–5.81)
RDW: 13.3 % (ref 11.5–15.5)
WBC: 10.2 10*3/uL (ref 4.0–10.5)
nRBC: 0 % (ref 0.0–0.2)

## 2023-05-25 LAB — RAPID URINE DRUG SCREEN, HOSP PERFORMED
Amphetamines: NOT DETECTED
Barbiturates: NOT DETECTED
Benzodiazepines: POSITIVE — AB
Cocaine: NOT DETECTED
Opiates: NOT DETECTED
Tetrahydrocannabinol: NOT DETECTED

## 2023-05-25 LAB — GLUCOSE, CAPILLARY
Glucose-Capillary: 43 mg/dL — CL (ref 70–99)
Glucose-Capillary: 58 mg/dL — ABNORMAL LOW (ref 70–99)

## 2023-05-25 LAB — PROCALCITONIN: Procalcitonin: <0.1 ng/mL

## 2023-05-25 LAB — CBG MONITORING, ED
Glucose-Capillary: 216 mg/dL — ABNORMAL HIGH (ref 70–99)
Glucose-Capillary: 41 mg/dL — CL (ref 70–99)
Glucose-Capillary: 139 mg/dL — ABNORMAL HIGH (ref 70–99)
Glucose-Capillary: 78 mg/dL (ref 70–99)
Glucose-Capillary: 159 mg/dL — ABNORMAL HIGH (ref 70–99)
Glucose-Capillary: 63 mg/dL — ABNORMAL LOW (ref 70–99)

## 2023-05-25 LAB — VITAMIN B12: Vitamin B-12: 765 pg/mL (ref 180–914)

## 2023-05-25 LAB — ETHANOL: Alcohol, Ethyl (B): <10 mg/dL (ref <10)

## 2023-05-25 LAB — FOLATE: Folate: 14.2 ng/mL (ref >5.9)

## 2023-05-25 LAB — TSH: TSH: 8.529 u[IU]/mL — ABNORMAL HIGH (ref 0.350–4.500)

## 2023-05-25 MED ORDER — DEXTROSE 50 % IV SOLN
12.5000 g | Freq: Once | INTRAVENOUS | Status: AC
  Administered 2023-05-25: 12.5 g via INTRAVENOUS

## 2023-05-25 MED ORDER — CHLORHEXIDINE GLUCONATE CLOTH 2 % EX PADS
6.0000 | MEDICATED_PAD | Freq: Every day | CUTANEOUS | Status: DC
  Administered 2023-05-25 – 2023-05-28 (×5): 6 via TOPICAL

## 2023-05-25 MED ORDER — SODIUM CHLORIDE 0.9 % IV BOLUS
1000.0000 mL | Freq: Once | INTRAVENOUS | Status: DC
Start: 2023-05-25 — End: 2023-05-25

## 2023-05-25 MED ORDER — DEXTROSE 50 % IV SOLN
1.0000 | Freq: Once | INTRAVENOUS | Status: AC
  Administered 2023-05-25: 50 mL via INTRAVENOUS
  Filled 2023-05-25: qty 50

## 2023-05-25 MED ORDER — DEXTROSE-SODIUM CHLORIDE 5-0.9 % IV SOLN: INTRAVENOUS | Status: DC

## 2023-05-25 MED ORDER — DEXTROSE 50 % IV SOLN
50.0000 mL | Freq: Once | INTRAVENOUS | Status: AC
  Administered 2023-05-25: 50 mL via INTRAVENOUS
  Filled 2023-05-25: qty 50

## 2023-05-25 MED ORDER — ACETAMINOPHEN 650 MG RE SUPP: 650.0000 mg | Freq: Four times a day (QID) | RECTAL | Status: DC | PRN

## 2023-05-25 MED ORDER — HEPARIN SODIUM (PORCINE) 5000 UNIT/ML IJ SOLN: 5000.0000 [IU] | Freq: Three times a day (TID) | INTRAMUSCULAR | Status: DC

## 2023-05-25 MED ORDER — SODIUM CHLORIDE 0.9 % IV SOLN: INTRAVENOUS | Status: AC

## 2023-05-25 MED ORDER — DEXTROSE 10 % IV SOLN: Freq: Once | INTRAVENOUS | Status: DC

## 2023-05-25 MED ORDER — SODIUM CHLORIDE 0.9 % IV BOLUS
1000.0000 mL | Freq: Once | INTRAVENOUS | Status: AC
  Administered 2023-05-25: 1000 mL via INTRAVENOUS

## 2023-05-25 MED ORDER — DEXTROSE 50 % IV SOLN
INTRAVENOUS | Status: AC
  Filled 2023-05-25: qty 50

## 2023-05-25 MED ORDER — DEXTROSE 10 % IV SOLN: Freq: Once | INTRAVENOUS | Status: AC

## 2023-05-25 MED ORDER — LEVETIRACETAM IN NACL 500 MG/100ML IV SOLN
500.0000 mg | Freq: Two times a day (BID) | INTRAVENOUS | Status: DC
  Administered 2023-05-25 – 2023-05-27 (×5): 500 mg via INTRAVENOUS
  Filled 2023-05-25 (×4): qty 100

## 2023-05-25 MED ORDER — ACETAMINOPHEN 325 MG PO TABS: 650.0000 mg | ORAL_TABLET | Freq: Four times a day (QID) | ORAL | Status: DC | PRN

## 2023-05-25 MED ORDER — NALOXONE HCL 0.4 MG/ML IJ SOLN: 0.4000 mg | INTRAMUSCULAR | Status: DC | PRN

## 2023-05-25 MED ORDER — DEXTROSE 10 % IV SOLN: INTRAVENOUS | Status: DC

## 2023-05-25 MED ORDER — THIAMINE HCL 100 MG/ML IJ SOLN
100.0000 mg | Freq: Every day | INTRAMUSCULAR | Status: DC
  Administered 2023-05-25 – 2023-05-27 (×3): 100 mg via INTRAVENOUS
  Filled 2023-05-25 (×3): qty 2

## 2023-05-25 MED ORDER — POLYETHYLENE GLYCOL 3350 17 G PO PACK: 17.0000 g | PACK | Freq: Every day | ORAL | Status: DC | PRN

## 2023-05-25 NOTE — Assessment & Plan Note (Addendum)
Has been incarcerated.  Blood alcohol level less than 10. -Check B12, folate -IV Thiamine 100mg  daily

## 2023-05-25 NOTE — ED Notes (Signed)
Attempted to do admission screening questions pt not responding to complete at this time.

## 2023-05-25 NOTE — Assessment & Plan Note (Addendum)
Incarcerated from [facility].  Somnolent on my exam, reported drooling, slow to respond.  Etiology is likely hypoglycemia, dehydration with blood sugar of 41, and AKI.  Also hypothermic temperature down to 94.4-possibly from hypoglycemia or infectious etiology.  WBC WNL- 10.2.  Actiq acid 1.  Blood alcohol level unremarkable.  Head CT showing lacunar infarcts age-indeterminate within left thalamus. -Obtain brain MRI- negative for acute abnormality -Check procalcitonin- <0.1, deferred antibiotics -UDS, UA - Portable chest x-ray-negative for acute abnormality -Remain n.p.o.  - Hydrate 1L bolus, Cont D5/Ns 100cc/hr x 20hrs -Per Med list he is on Keppra, check EEG -Pin-point pupils, equal bilaterally, trial of Narcan 0.4mg  x 1 -Warming blanket -Check B12, folate, TSH -Resume keppra 500mg  IV 500 twice daily -Thiamine 100g IV daily -Addendum-patient still hypoglycemic, down to 43, D50 x 2 given, switch fluids to D10 @ 50 and cont N/s @ 75. Patient now awake, asking and able to eat.

## 2023-05-25 NOTE — H&P (Addendum)
History and Physical    Rick Williams OZH:[id number] DOB: 09/21/1965 DOA: 05/25/2023  PCP: Patient, No Pcp Per   Patient coming from: Incarcerated- [facility]  I have personally briefly reviewed patient's old medical records in [facility]  Chief Complaint: Slow to respond, drooling  HPI: Rick Williams is a 57 y.o. male with medical history significant for intracranial hemorrhage, alcohol abuse, hypertension, per discharge summary from [facility] also CHF, hypertension. At the time of my evaluation, patient is somnolent, not responding to questions, barely responds to touch, but arouses to pain.  History is obtained from chart review and Corporate treasurer at bedside. Patient was brought to the ED from [facility] jail reports of altered mental status-slow to respond, drooling.  Per correctional officer at bedside, yesterday patient was walking to obtain and his normal self, this morning he was altered.  As far as Public relations account executive is awake, patient has been eating okay.  He was admitted 8/27 to 9/1 for 5 days at [facility]- ICU also for altered mental status and hypoglycemia with AKI, started on fluids and D10.  For some reason at this time, there are no details in Care Everywhere, but there is a stack of papers at bedside-which includes initial ED summary and initial HPI by admitting physician.  Blood sugar on arrival to their ED was 44, head CT was showed age-indeterminate basal ganglia and thalamic infarcts.  Per notes prior to arrival at ED, patient had been refusing food and medications- but might have been 2/2  altered mental status. Discharge summary, details of hospitalization and subsequent workup are not present or available.  Per correction officer at bedside, patient was initially picked up by [facility], about 10 days ago and seeing that he had medical issues, taken to [facility], admitted, subsequently discharged and sent to  [facility] jail.  ED Course: Hypothermic temperature 94.4, heart rate 55-64, respiratory 12-18, blood pressure systolic 90s to 118.  O2 sats greater than 97% on room air. On arrival to the ED here, blood sugar was 41.  Unremarkable blood alcohol level Sodium 129, creatinine elevated 1.67, WBC 10.2.  Head CT- Lacunar infarct within the left thalamus, new from the prior brain MRI of 01/21/2020 but otherwise age-indeterminate. A brain MRI may be obtained for further evaluation, as clinically warranted. D50-total of given, with 100 mL of D10. Blood cultures obtained.  Review of Systems: Unable to assess due to altered mental status  Past Medical History:  Diagnosis Date   Alcohol abuse    Cerebral hemorrhage (HCC) 2014   Hemothorax on left    Hypertension    Pneumonia    Stroke [facility]) 2007   no deficits    History reviewed. No pertinent surgical history.   reports that he has been smoking cigarettes. He has never used smokeless tobacco. He reports current alcohol use. He reports current drug use. Drug: Marijuana.  Allergies  Allergen Reactions   Benadryl [Diphenhydramine Hcl (Sleep)] Itching and Rash    Family History  Problem Relation Age of Onset   Hypertension Mother    Hypertension Father     Prior to Admission medications   Medication Sig Start Date End Date Taking? Authorizing Provider  ADVAIR DISKUS 250-50 MCG/ACT AEPB Inhale 1 puff into the lungs 2 (two) times daily. 05/17/23  Yes [provider]  amLODipine (NORVASC) 10 MG tablet Take 10 mg by mouth daily. 05/24/23  Yes [provider]  carvedilol (COREG)  6.25 MG tablet Take 6.25 mg by mouth 2 (two) times daily. 05/24/23  Yes [provider]  clopidogrel (PLAVIX) 75 MG tablet Take 75 mg by mouth daily. 05/24/23  Yes [provider]  fluticasone (FLONASE) 50 MCG/ACT nasal spray Place 1 spray into both nostrils daily. 04/19/23  Yes [provider]  hydrOXYzine (VISTARIL)  25 MG capsule Take 25 mg by mouth 3 (three) times daily. 05/24/23  Yes [provider]  isosorbide mononitrate (IMDUR) 30 MG 24 hr tablet Take 30 mg by mouth daily. 05/24/23  Yes [provider]  levETIRAcetam (KEPPRA) 500 MG tablet Take 500 mg by mouth 2 (two) times daily. 05/24/23  Yes [provider]  levothyroxine (SYNTHROID) 25 MCG tablet Take 25 mcg by mouth daily. 05/24/23  Yes [provider]  losartan-hydrochlorothiazide (HYZAAR) 100-12.5 MG tablet Take 1 tablet by mouth daily. 05/24/23  Yes [provider]  mirtazapine (REMERON SOL-TAB) 15 MG disintegrating tablet Take 15 mg by mouth at bedtime. 05/18/23  Yes [provider]  mirtazapine (REMERON) 15 MG tablet Take 15 mg by mouth at bedtime. 05/24/23  Yes [provider]  albuterol (VENTOLIN HFA) 108 (90 Base) MCG/ACT inhaler Inhale 1 puff into the lungs every 4 (four) hours as needed for wheezing or shortness of breath. 05/24/23   [provider]  aspirin EC 81 MG EC tablet Take 1 tablet (81 mg total) by mouth daily. 01/23/20   Tyrone Nine, MD  atorvastatin (LIPITOR) 40 MG tablet Take 1 tablet (40 mg total) by mouth daily. 01/23/20   Tyrone Nine, MD  cloNIDine (CATAPRES - DOSED IN MG/24 HR) 0.2 mg/24hr patch Place 0.2 mg onto the skin once a week. 05/17/23   [provider]  cloNIDine (CATAPRES) 0.2 MG tablet Take 1 tablet (0.2 mg total) by mouth 2 (two) times daily. 01/22/20   Tyrone Nine, MD  folic acid (FOLVITE) 1 MG tablet Take 1 tablet (1 mg total) by mouth daily. 01/23/20   Noralee Stain, DO  lisinopril (ZESTRIL) 20 MG tablet Take 1 tablet (20 mg total) by mouth daily. 01/23/20   Noralee Stain, DO  Multiple Vitamin (DAILY-VITE) TABS Take 1 tablet by mouth daily. 05/24/23   [provider]  nicotine (NICODERM CQ - DOSED IN MG/24 HOURS) 21 mg/24hr patch Place 21 mg onto the skin daily. 05/17/23   [provider]  thiamine 100 MG tablet Take 1 tablet (100 mg  total) by mouth daily. 01/23/20   Noralee Stain, DO    Physical Exam: Exam limited by altered mental status Vitals:   05/25/23 1300 05/25/23 1400 05/25/23 1545 05/25/23 1600  BP: 113/82 92/76 112/83 113/88  Pulse: (!) 55 (!) 57 (!) 57 (!) 59  Resp: 16 17 18 12   Temp:      TempSrc:      SpO2: 97% 99% 97% 97%  Weight: 70 kg       Constitutional: Somnolent Vitals:   05/25/23 1300 05/25/23 1400 05/25/23 1545 05/25/23 1600  BP: 113/82 92/76 112/83 113/88  Pulse: (!) 55 (!) 57 (!) 57 (!) 59  Resp: 16 17 18 12   Temp:      TempSrc:      SpO2: 97% 99% 97% 97%  Weight: 70 kg      Eyes: Pupils pinpoint, lids and conjunctivae normal ENMT: Mucous membranes are markedly dry Neck: normal, supple, no masses, no thyromegaly Respiratory: Normal respiratory effort. No accessory muscle use.  Cardiovascular: Regular rate and rhythm, no  murmurs / rubs / gallops. No extremity edema.  Extremities warm. Abdomen: no tenderness, no masses palpated. No hepatosplenomegaly.   Musculoskeletal: no clubbing / cyanosis. No joint deformity upper and lower extremities.  Skin: Firm swelling nodule-like to lateral side of left lower extremity, measuring about 2 cm across, appears dry, with scab.  Neurologic: Limited exam, handcuffs to bilateral legs, and right wrist, moving left upper extremity and bilateral lower extremity to stimulation Psychiatric: Somnolent, barely responds to touch, awakens to pain  Labs on Admission: I have personally reviewed following labs and imaging studies  CBC: Recent Labs  Lab 05/25/23 1259  WBC 10.2  NEUTROABS 6.6  HGB 13.7  HCT 41.7  MCV 98.6  PLT 283   Basic Metabolic Panel: Recent Labs  Lab 05/25/23 1259  NA 129*  K 4.6  CL 95*  CO2 24  GLUCOSE 93  BUN 32*  CREATININE 1.67*  CALCIUM 9.3   GFR: CrCl cannot be calculated (Unknown ideal weight.). Liver Function Tests: Recent Labs  Lab 05/25/23 1259  AST 30  ALT 18  ALKPHOS 71  BILITOT 0.7  PROT 9.3*   ALBUMIN 3.4*   CBG: Recent Labs  Lab 05/25/23 1246 05/25/23 1315 05/25/23 1407 05/25/23 1543  GLUCAP 41* 216* 139* 78   Radiological Exams on Admission: CT Head Wo Contrast  Result Date: 05/25/2023 CLINICAL DATA:  Provided history: Delirium. Additional history provided: Drooling, slow to respond, altered mental status. EXAM: CT HEAD WITHOUT CONTRAST TECHNIQUE: Contiguous axial images were obtained from the base of the skull through the vertex without intravenous contrast. RADIATION DOSE REDUCTION: This exam was performed according to the departmental dose-optimization program which includes automated exposure control, adjustment of the mA and/or kV according to patient size and/or use of iterative reconstruction technique. COMPARISON:  Brain MRI 01/21/2020. Prior head CT examinations 01/19/2020 and earlier. FINDINGS: Brain: Generalized cerebral atrophy. Lacunar infarct within the left thalamus, new from the prior brain MRI of 01/20/2019 but otherwise age-indeterminate. Redemonstrated chronic infarct within the right corona radiata, basal ganglia, external capsule and insula. Background moderate patchy and ill-defined hypoattenuation within the cerebral white matter. There is no acute intracranial hemorrhage. No extra-axial fluid collection. No evidence of an intracranial mass. No midline shift. Vascular: No hyperdense vessel.  Atherosclerotic calcifications. Skull: No calvarial fracture or aggressive osseous lesion. Sinuses/Orbits: No mass or acute finding within the imaged orbits. Minimal secretions within the left maxillary sinus at the imaged levels. IMPRESSION: 1. Lacunar infarct within the left thalamus, new from the prior brain MRI of 01/21/2020 but otherwise age-indeterminate. A brain MRI may be obtained for further evaluation, as clinically warranted. 2. Redemonstrated chronic infarct within the right corona radiata, basal ganglia, external capsule and insula. 3. Background moderate,  nonspecific cerebral white matter disease. 4. Generalized cerebral atrophy. Electronically Signed   By: Jackey Loge D.O.   On: 05/25/2023 16:04    EKG: Sinus rhythm.  Rate 84, QTc 445.  ST /T wave abnormalities in lead 2, 3, aVF slightly more pronounced compared to prior EKG.  Assessment/Plan Principal Problem:   Acute metabolic encephalopathy Active Problems:   AKI (acute kidney injury) (HCC)   Hypoglycemia   Hyponatremia   Hypertension   Alcohol abuse   Congestive heart failure (CHF) (HCC)  Assessment and Plan: * Acute metabolic encephalopathy Incarcerated from [facility] jail.  Somnolent on my exam, reported drooling, slow to respond.  Etiology is likely hypoglycemia, dehydration with blood sugar of 41, and AKI.  Also hypothermic temperature down to 94.4-possibly  from hypoglycemia or infectious etiology.  WBC WNL- 10.2.  Actiq acid 1.  Blood alcohol level unremarkable.  Head CT showing lacunar infarcts age-indeterminate within left thalamus. -Obtain brain MRI- negative for acute abnormality -Check procalcitonin- <0.1, deferred antibiotics -UDS, UA - Portable chest x-ray-negative for acute abnormality -Remain n.p.o.  - Hydrate 1L bolus, Cont D5/Ns 100cc/hr x 20hrs -Per Med list he is on Keppra, check EEG -Pin-point pupils, equal bilaterally, trial of Narcan 0.4mg  x 1 -Warming blanket -Check B12, folate, TSH -Resume keppra 500mg  IV 500 twice daily -Thiamine 100g IV daily -Addendum-patient still hypoglycemic, down to 43, D50 x 2 given, switch fluids to D10 @ 50 and cont N/s @ 75. Patient now awake, asking and able to eat.  Hyponatremia Sodium 129.  Baseline last check 3 years ago low 130s. -Hydrate he appears dehydrated  Hypoglycemia Blood sugars down to 41>>> 159.  Recent hospitalization at [facility] for same with altered mental status hypoglycemia and AKI. -Hydrate with D5 fluids - CBG every 2 hourly - Add-on to previous labs- -insulin, proinsulin insulin  ratio, C-peptide  AKI (acute kidney injury) (HCC) Creatinine-1.67.  Baseline ~ 0.9 -Hydrate -Hold losartan HCTZ  Congestive heart failure (CHF) (HCC) Unspecified type.  Appears stable and compensated.  Appears dehydrated.  Last echo 01/2020 EF of 65 to 70% with grade 1 DD.  Per med list not on diuretics.  Alcohol abuse Has been incarcerated.  Blood alcohol level less than 10. -Check B12, folate -IV Thiamine 100mg  daily  Hypertension Blood pressure soft, systolic 90s. -Hold carvedilol, clonidine 0.2 mg twice daily, Imdur, losartan HCTZ   DVT prophylaxis: SCDS-with hx of ICH, pending MRI consider pharmacologic DVT Code Status: Full code. Family Communication: None at bedside.  No family listed in demographics Disposition Plan: ~ 2 days Admission status: Inpt Stepdown I certify that at the point of admission it is my clinical judgment that the patient will require inpatient hospital care spanning beyond 2 midnights from the point of admission due to high intensity of service, high risk for further deterioration and high frequency of surveillance required.  Author: Onnie Boer, MD 05/25/2023 10:22 PM  For on call review [email or web address].

## 2023-05-25 NOTE — ED Notes (Signed)
Bair hugger applied. MD aware. Primary RN aware

## 2023-05-25 NOTE — Assessment & Plan Note (Addendum)
Blood sugars down to 41>>> 159.  Recent hospitalization at [facility] for same with altered mental status hypoglycemia and AKI. -Hydrate with D5 fluids - CBG every 2 hourly - Add-on to previous labs- -insulin, proinsulin insulin ratio, C-peptide

## 2023-05-25 NOTE — Assessment & Plan Note (Addendum)
Creatinine-1.67.  Baseline ~ 0.9 -Hydrate -Hold losartan HCTZ

## 2023-05-25 NOTE — ED Provider Notes (Signed)
[facility] AT [facility] Provider Note  CSN: [id number] Arrival date & time: 05/25/23 1237  Chief Complaint(s) Altered Mental Status  HPI Rick Williams is a 57 y.o. male with PMH alcohol abuse, HTN, recent hospitalization on 05/17/2023 with ICU stay on a D10 drip for unexplained hypoglycemia who presents emergency department for evaluation of altered mental status and hypoglycemia.  [facility] prison [facility] with excessive somnolence but will awaken and answer questions appropriately.  Currently denying chest pain, shortness of breath, Donnell pain, nausea, vomiting or other systemic symptoms.  Patient arrives hypothermic to 94.4 but vital signs otherwise unremarkable.   Past Medical History Past Medical History:  Diagnosis Date   Alcohol abuse    Cerebral hemorrhage (HCC) 2014   Hemothorax on left    Hypertension    Pneumonia    Stroke [facility]) 2007   no deficits   Patient Active Problem List   Diagnosis Date Noted   Acute metabolic encephalopathy 05/25/2023   AKI (acute kidney injury) (HCC) 05/25/2023   Hypoglycemia 05/25/2023   Congestive heart failure (CHF) (HCC) 05/25/2023   Hyponatremia 05/25/2023   Left arm weakness 01/20/2020   Wrist drop, left 01/20/2020   Left wrist drop 01/20/2020   Alcohol intoxication, uncomplicated (HCC)    Elevated AST (SGOT)    Macrocytosis without anemia    Uncontrolled hypertension    Alcohol abuse    Visual impairment    Left-sided weakness 02/26/2013   Leg weakness 02/26/2013   Pain in joint, lower leg 02/26/2013   Muscle weakness (generalized) 02/26/2013   Lack of coordination 02/26/2013   ICH (intracerebral hemorrhage) (HCC) 02/07/2013   Essential hypertension, malignant 02/06/2013   Encounter for long-term (current) use of high-risk medication 02/06/2013   History of Hemorrhagic stroke 01/30/2013   Acute respiratory failure due to AMS 01/30/2013   Hypertension 01/30/2013   IVH  (intraventricular hemorrhage) (HCC) 01/30/2013   Intracranial hypertension 01/30/2013   Home Medication(s) Prior to Admission medications   Medication Sig Start Date End Date Taking? Authorizing Provider  ADVAIR DISKUS 250-50 MCG/ACT AEPB Inhale 1 puff into the lungs 2 (two) times daily. 05/17/23  Yes [provider]  amLODipine (NORVASC) 10 MG tablet Take 10 mg by mouth daily. 05/24/23  Yes [provider]  carvedilol (COREG) 6.25 MG tablet Take 6.25 mg by mouth 2 (two) times daily. 05/24/23  Yes [provider]  clopidogrel (PLAVIX) 75 MG tablet Take 75 mg by mouth daily. 05/24/23  Yes [provider]  fluticasone (FLONASE) 50 MCG/ACT nasal spray Place 1 spray into both nostrils daily. 04/19/23  Yes [provider]  hydrOXYzine (VISTARIL) 25 MG capsule Take 25 mg by mouth 3 (three) times daily. 05/24/23  Yes [provider]  isosorbide mononitrate (IMDUR) 30 MG 24 hr tablet Take 30 mg by mouth daily. 05/24/23  Yes [provider]  levETIRAcetam (KEPPRA) 500 MG tablet Take 500 mg by mouth 2 (two) times daily. 05/24/23  Yes [provider]  levothyroxine (SYNTHROID) 25 MCG tablet Take 25 mcg by mouth daily. 05/24/23  Yes [provider]  losartan-hydrochlorothiazide (HYZAAR) 100-12.5 MG tablet Take 1 tablet by mouth daily. 05/24/23  Yes [provider]  mirtazapine (REMERON SOL-TAB) 15 MG disintegrating tablet Take 15 mg by mouth at bedtime. 05/18/23  Yes [provider]  mirtazapine (REMERON) 15 MG tablet Take 15 mg by mouth at bedtime. 05/24/23  Yes [provider]  albuterol (VENTOLIN HFA) 108 (90 Base) MCG/ACT inhaler  Inhale 1 puff into the lungs every 4 (four) hours as needed for wheezing or shortness of breath. 05/24/23   [provider]  aspirin EC 81 MG EC tablet Take 1 tablet (81 mg total) by mouth daily. 01/23/20   Tyrone Nine, MD  atorvastatin (LIPITOR) 40 MG tablet Take 1 tablet (40 mg total)  by mouth daily. 01/23/20   Tyrone Nine, MD  cloNIDine (CATAPRES - DOSED IN MG/24 HR) 0.2 mg/24hr patch Place 0.2 mg onto the skin once a week. 05/17/23   [provider]  cloNIDine (CATAPRES) 0.2 MG tablet Take 1 tablet (0.2 mg total) by mouth 2 (two) times daily. 01/22/20   Tyrone Nine, MD  folic acid (FOLVITE) 1 MG tablet Take 1 tablet (1 mg total) by mouth daily. 01/23/20   Noralee Stain, DO  lisinopril (ZESTRIL) 20 MG tablet Take 1 tablet (20 mg total) by mouth daily. 01/23/20   Noralee Stain, DO  Multiple Vitamin (DAILY-VITE) TABS Take 1 tablet by mouth daily. 05/24/23   [provider]  nicotine (NICODERM CQ - DOSED IN MG/24 HOURS) 21 mg/24hr patch Place 21 mg onto the skin daily. 05/17/23   [provider]  thiamine 100 MG tablet Take 1 tablet (100 mg total) by mouth daily. 01/23/20   Noralee Stain, DO                                                                                                                                    Past Surgical History History reviewed. No pertinent surgical history. Family History Family History  Problem Relation Age of Onset   Hypertension Mother    Hypertension Father     Social History Social History   Tobacco Use   Smoking status: Every Day    Current packs/day: 0.50    Types: Cigarettes   Smokeless tobacco: Never  Vaping Use   Vaping status: Never Used  Substance Use Topics   Alcohol use: Yes    Comment: twice a week    Drug use: Yes    Types: Marijuana    Comment: last used 2 weeks ago   Allergies Benadryl [diphenhydramine hcl (sleep)]  Review of Systems Review of Systems  Psychiatric/Behavioral:  Positive for confusion.     Physical Exam Vital Signs  I have reviewed the triage vital signs BP 97/75   Pulse 61   Temp (!) 94.4 F (34.7 C) (Rectal)   Resp 12   Wt 70 kg   SpO2 97%   BMI 22.14 kg/m   Physical Exam Vitals and nursing note reviewed.  Constitutional:      General: He is not in acute  distress.    Appearance: He is well-developed.  HENT:     Head: Normocephalic and atraumatic.  Eyes:     Conjunctiva/sclera: Conjunctivae normal.  Cardiovascular:     Rate and Rhythm: Normal rate and regular rhythm.  Heart sounds: No murmur heard. Pulmonary:     Effort: Pulmonary effort is normal. No respiratory distress.  Musculoskeletal:        General: No swelling.     Cervical back: Neck supple.  Skin:    General: Skin is warm and dry.  Neurological:     Mental Status: He is alert. He is disoriented.  Psychiatric:        Mood and Affect: Mood normal.     ED Results and Treatments Labs (all labs ordered are listed, but only abnormal results are displayed) Labs Reviewed  COMPREHENSIVE METABOLIC PANEL - Abnormal; Notable for the following components:      Result Value   Sodium 129 (*)    Chloride 95 (*)    BUN 32 (*)    Creatinine, Ser 1.67 (*)    Total Protein 9.3 (*)    Albumin 3.4 (*)    GFR, Estimated 48 (*)    All other components within normal limits  CBG MONITORING, ED - Abnormal; Notable for the following components:   Glucose-Capillary 41 (*)    All other components within normal limits  CBG MONITORING, ED - Abnormal; Notable for the following components:   Glucose-Capillary 216 (*)    All other components within normal limits  CBG MONITORING, ED - Abnormal; Notable for the following components:   Glucose-Capillary 139 (*)    All other components within normal limits  CBG MONITORING, ED - Abnormal; Notable for the following components:   Glucose-Capillary 159 (*)    All other components within normal limits  CULTURE, BLOOD (ROUTINE X 2)  CULTURE, BLOOD (ROUTINE X 2)  CBC WITH DIFFERENTIAL/PLATELET  ETHANOL  LACTIC ACID, PLASMA  PROCALCITONIN  URINALYSIS, ROUTINE W REFLEX MICROSCOPIC  LACTIC ACID, PLASMA  RAPID URINE DRUG SCREEN, HOSP PERFORMED  INSULIN, RANDOM  C-PEPTIDE  PROINSULIN/INSULIN RATIO  CBG MONITORING, ED                                                                                                                           Radiology CT Head Wo Contrast  Result Date: 05/25/2023 CLINICAL DATA:  Provided history: Delirium. Additional history provided: Drooling, slow to respond, altered mental status. EXAM: CT HEAD WITHOUT CONTRAST TECHNIQUE: Contiguous axial images were obtained from the base of the skull through the vertex without intravenous contrast. RADIATION DOSE REDUCTION: This exam was performed according to the departmental dose-optimization program which includes automated exposure control, adjustment of the mA and/or kV according to patient size and/or use of iterative reconstruction technique. COMPARISON:  Brain MRI 01/21/2020. Prior head CT examinations 01/19/2020 and earlier. FINDINGS: Brain: Generalized cerebral atrophy. Lacunar infarct within the left thalamus, new from the prior brain MRI of 01/20/2019 but otherwise age-indeterminate. Redemonstrated chronic infarct within the right corona radiata, basal ganglia, external capsule and insula. Background moderate patchy and ill-defined hypoattenuation within the cerebral white matter. There is no acute intracranial hemorrhage. No extra-axial fluid collection. No evidence of an  intracranial mass. No midline shift. Vascular: No hyperdense vessel.  Atherosclerotic calcifications. Skull: No calvarial fracture or aggressive osseous lesion. Sinuses/Orbits: No mass or acute finding within the imaged orbits. Minimal secretions within the left maxillary sinus at the imaged levels. IMPRESSION: 1. Lacunar infarct within the left thalamus, new from the prior brain MRI of 01/21/2020 but otherwise age-indeterminate. A brain MRI may be obtained for further evaluation, as clinically warranted. 2. Redemonstrated chronic infarct within the right corona radiata, basal ganglia, external capsule and insula. 3. Background moderate, nonspecific cerebral white matter disease. 4. Generalized cerebral  atrophy. Electronically Signed   By: Jackey Loge D.O.   On: 05/25/2023 16:04    Pertinent labs & imaging results that were available during my care of the patient were reviewed by me and considered in my medical decision making (see MDM for details).  Medications Ordered in ED Medications  sodium chloride 0.9 % bolus 1,000 mL (has no administration in time range)  naloxone [facility]) injection 0.4 mg (has no administration in time range)  dextrose 50 % solution 50 mL (50 mLs Intravenous Given 05/25/23 1258)  dextrose 50 % solution 12.5 g (12.5 g Intravenous Given 05/25/23 1602)  dextrose 10 % infusion (0 mLs Intravenous Stopped 05/25/23 1818)                                                                                                                                     Procedures .Critical Care  Performed by: Glendora Score, MD Authorized by: Glendora Score, MD   Critical care provider statement:    Critical care time (minutes):  30   Critical care was necessary to treat or prevent imminent or life-threatening deterioration of the following conditions:  Metabolic crisis   Critical care was time spent personally by me on the following activities:  Development of treatment plan with patient or surrogate, discussions with consultants, evaluation of patient's response to treatment, examination of patient, ordering and review of laboratory studies, ordering and review of radiographic studies, ordering and performing treatments and interventions, pulse oximetry, re-evaluation of patient's condition and review of old charts   (including critical care time)  Medical Decision Making / ED Course   This patient presents to the ED for concern of altered mental status, hypoglycemia, this involves an extensive number of treatment options, and is a complaint that carries with it a high risk of complications and morbidity.  The differential diagnosis includes decreased p.o. intake, insulin overdose,  insulinoma, adrenal insufficiency  MDM: Patient seen emergency room for evaluation of hypoglycemia and altered mental status.  Physical exam largely unremarkable.  Initial presenting sugars 41.  IV placed and patient given 1 amp of D50.  Follow-up POC glucose greater than 200.  Laboratory evaluation with BUN 32, creatinine 1.67 which is improving from outside labs.  Glucose downtrending fairly significantly to 139.  At time of signout, patient pending CT head and follow-up glucose  testing.  Anticipate admission for persistent hypoglycemia of unknown origin.  Please see provider signout note for continuation of workup.   Additional history obtained: -Additional history obtained from prison staff -External records from outside source obtained and reviewed including: Chart review including previous notes, labs, imaging, consultation notes   Lab Tests: -I ordered, reviewed, and interpreted labs.   The pertinent results include:   Labs Reviewed  COMPREHENSIVE METABOLIC PANEL - Abnormal; Notable for the following components:      Result Value   Sodium 129 (*)    Chloride 95 (*)    BUN 32 (*)    Creatinine, Ser 1.67 (*)    Total Protein 9.3 (*)    Albumin 3.4 (*)    GFR, Estimated 48 (*)    All other components within normal limits  CBG MONITORING, ED - Abnormal; Notable for the following components:   Glucose-Capillary 41 (*)    All other components within normal limits  CBG MONITORING, ED - Abnormal; Notable for the following components:   Glucose-Capillary 216 (*)    All other components within normal limits  CBG MONITORING, ED - Abnormal; Notable for the following components:   Glucose-Capillary 139 (*)    All other components within normal limits  CBG MONITORING, ED - Abnormal; Notable for the following components:   Glucose-Capillary 159 (*)    All other components within normal limits  CULTURE, BLOOD (ROUTINE X 2)  CULTURE, BLOOD (ROUTINE X 2)  CBC WITH DIFFERENTIAL/PLATELET   ETHANOL  LACTIC ACID, PLASMA  PROCALCITONIN  URINALYSIS, ROUTINE W REFLEX MICROSCOPIC  LACTIC ACID, PLASMA  RAPID URINE DRUG SCREEN, HOSP PERFORMED  INSULIN, RANDOM  C-PEPTIDE  PROINSULIN/INSULIN RATIO  CBG MONITORING, ED        Imaging Studies ordered: I ordered imaging studies including CT head and this is pending   Medicines ordered and prescription drug management: Meds ordered this encounter  Medications   dextrose 50 % solution 50 mL   DISCONTD: dextrose 10 % infusion   dextrose 50 % solution 12.5 g   dextrose 10 % infusion   dextrose 50 % solution    Primitivo Gauze, Angela K: cabinet override   DISCONTD: sodium chloride 0.9 % bolus 1,000 mL   sodium chloride 0.9 % bolus 1,000 mL   naloxone (NARCAN) injection 0.4 mg    -I have reviewed the patients home medicines and have made adjustments as needed  Critical interventions Glucose administration   Cardiac Monitoring: The patient was maintained on a cardiac monitor.  I personally viewed and interpreted the cardiac monitored which showed an underlying rhythm of: NSR  Social Determinants of Health:  Factors impacting patients care include: Currently incarcerated   Reevaluation: After the interventions noted above, I reevaluated the patient and found that they have :improved  Co morbidities that complicate the patient evaluation  Past Medical History:  Diagnosis Date   Alcohol abuse    Cerebral hemorrhage (HCC) 2014   Hemothorax on left    Hypertension    Pneumonia    Stroke [facility]) 2007   no deficits      Dispostion: I considered admission for this patient, and disposition pending CT imaging and repeat glucose testing.  Please see provider signout note for continuation of workup     Final Clinical Impression(s) / ED Diagnoses Final diagnoses:  None     @PCDICTATION @    Glendora Score, MD 05/25/23 1826

## 2023-05-25 NOTE — Assessment & Plan Note (Addendum)
Unspecified type.  Appears stable and compensated.  Appears dehydrated.  Last echo 01/2020 EF of 65 to 70% with grade 1 DD.  Per med list not on diuretics.

## 2023-05-25 NOTE — Assessment & Plan Note (Signed)
Sodium 129.  Baseline last check 3 years ago low 130s. -Hydrate he appears dehydrated

## 2023-05-25 NOTE — ED Triage Notes (Signed)
BIB [facility] c/o excessive drooling, slow to respond, and AMS.  Recently d/c from [facility] on 05/22/23 for hypoglycemia.  No hx of DM noted.  Hx of ETOH abuse,cva, and CHF.  Pt is A&O to self and time.  Cbg-41 in traige

## 2023-05-25 NOTE — Assessment & Plan Note (Signed)
Blood pressure soft, systolic 90s. -Hold carvedilol, clonidine 0.2 mg twice daily, Imdur, losartan HCTZ

## 2023-05-25 NOTE — ED Provider Notes (Signed)
Signout from Dr. Posey Rea.  D21-year-old male currently incarcerated here with altered mental status low blood sugar.  He has a history of stroke, hypertension, alcohol use.  He was just discharged from [facility] for hypoglycemia that included a prolonged workup.  No clear etiology was identified.  Patient's blood pressure is low here at 94 4.  He has received 1 amp of D50.  He is pending head CT and p.o. challenge.  If his blood sugars continue to trend down may need admission to the hospital for further workup. Physical Exam  BP (!) 118/92   Pulse 64   Temp (!) 94.4 F (34.7 C) (Rectal)   Resp 18   Wt 70 kg   SpO2 99%   BMI 22.14 kg/m   Physical Exam  Procedures  Procedures  ED Course / MDM    Medical Decision Making Amount and/or Complexity of Data Reviewed Labs: ordered. Radiology: ordered.  Risk Prescription drug management. Decision regarding hospitalization.   3:40 PM.  Patient is less responsive again.  Repeat core temp is now 92.3.  Bear hugger to be started again.  Ordered cultures and lactate.  Will check another fingerstick.  Repeat blood sugar 78.  Patient is drowsy arousable to stimulation will answer some simple questions but states he is very tired.  Does have a wound on his lateral left foot.  Patient's mental status continues to be very sluggish.  Blood sugars dropping so have started on D10 drip and asked hospitalist to evaluate as do not feel he would be safe to return to a medically unsupervised place.     Terrilee Files, MD 05/26/23 1027

## 2023-05-25 NOTE — ED Notes (Signed)
Pt unable to give urine specimen at this time 

## 2023-05-26 ENCOUNTER — Inpatient Hospital Stay (HOSPITAL_COMMUNITY)
Admit: 2023-05-26 | Discharge: 2023-05-26 | Disposition: A | Discharge status: Home / Self Care | Payer: MEDICAID | Attending: Internal Medicine | Admitting: Internal Medicine

## 2023-05-26 ENCOUNTER — Inpatient Hospital Stay (HOSPITAL_COMMUNITY): Payer: MEDICAID

## 2023-05-26 DIAGNOSIS — R569 Unspecified convulsions: Secondary | ICD-10-CM | POA: Diagnosis not present

## 2023-05-26 DIAGNOSIS — N179 Acute kidney failure, unspecified: Secondary | ICD-10-CM

## 2023-05-26 DIAGNOSIS — E871 Hypo-osmolality and hyponatremia: Secondary | ICD-10-CM | POA: Diagnosis not present

## 2023-05-26 DIAGNOSIS — R4182 Altered mental status, unspecified: Secondary | ICD-10-CM

## 2023-05-26 DIAGNOSIS — G9341 Metabolic encephalopathy: Secondary | ICD-10-CM | POA: Diagnosis not present

## 2023-05-26 DIAGNOSIS — E162 Hypoglycemia, unspecified: Secondary | ICD-10-CM | POA: Diagnosis not present

## 2023-05-26 LAB — GLUCOSE, CAPILLARY
Glucose-Capillary: 113 mg/dL — ABNORMAL HIGH (ref 70–99)
Glucose-Capillary: 113 mg/dL — ABNORMAL HIGH (ref 70–99)
Glucose-Capillary: 122 mg/dL — ABNORMAL HIGH (ref 70–99)
Glucose-Capillary: 129 mg/dL — ABNORMAL HIGH (ref 70–99)
Glucose-Capillary: 39 mg/dL — CL (ref 70–99)
Glucose-Capillary: 57 mg/dL — ABNORMAL LOW (ref 70–99)
Glucose-Capillary: 69 mg/dL — ABNORMAL LOW (ref 70–99)
Glucose-Capillary: 69 mg/dL — ABNORMAL LOW (ref 70–99)
Glucose-Capillary: 82 mg/dL (ref 70–99)
Glucose-Capillary: 83 mg/dL (ref 70–99)
Glucose-Capillary: 88 mg/dL (ref 70–99)
Glucose-Capillary: 96 mg/dL (ref 70–99)
Glucose-Capillary: 97 mg/dL (ref 70–99)
Glucose-Capillary: 98 mg/dL (ref 70–99)
Glucose-Capillary: 98 mg/dL (ref 70–99)
Glucose-Capillary: 99 mg/dL (ref 70–99)

## 2023-05-26 LAB — BASIC METABOLIC PANEL
Anion gap: 10 (ref 5–15)
BUN: 28 mg/dL — ABNORMAL HIGH (ref 6–20)
CO2: 22 mmol/L (ref 22–32)
Calcium: 8.4 mg/dL — ABNORMAL LOW (ref 8.9–10.3)
Chloride: 99 mmol/L (ref 98–111)
Creatinine, Ser: 1.64 mg/dL — ABNORMAL HIGH (ref 0.61–1.24)
GFR, Estimated: 49 mL/min — ABNORMAL LOW (ref >60)
Glucose, Bld: 100 mg/dL — ABNORMAL HIGH (ref 70–99)
Potassium: 3.8 mmol/L (ref 3.5–5.1)
Sodium: 131 mmol/L — ABNORMAL LOW (ref 135–145)

## 2023-05-26 LAB — CBC
HCT: 33.8 % — ABNORMAL LOW (ref 39.0–52.0)
Hemoglobin: 11.5 g/dL — ABNORMAL LOW (ref 13.0–17.0)
MCH: 32.8 pg (ref 26.0–34.0)
MCHC: 34 g/dL (ref 30.0–36.0)
MCV: 96.3 fL (ref 80.0–100.0)
Platelets: 233 10*3/uL (ref 150–400)
RBC: 3.51 MIL/uL — ABNORMAL LOW (ref 4.22–5.81)
RDW: 13.4 % (ref 11.5–15.5)
WBC: 9.1 10*3/uL (ref 4.0–10.5)
nRBC: 0 % (ref 0.0–0.2)

## 2023-05-26 LAB — CORTISOL-AM, BLOOD: Cortisol - AM: 7.4 ug/dL (ref 6.7–22.6)

## 2023-05-26 LAB — CK: Total CK: 138 U/L (ref 49–397)

## 2023-05-26 LAB — MAGNESIUM: Magnesium: 1.7 mg/dL (ref 1.7–2.4)

## 2023-05-26 LAB — T4, FREE: Free T4: 0.85 ng/dL (ref 0.61–1.12)

## 2023-05-26 LAB — HEMOGLOBIN A1C
Hgb A1c MFr Bld: 6.7 % — ABNORMAL HIGH (ref 4.8–5.6)
Mean Plasma Glucose: 145.59 mg/dL

## 2023-05-26 LAB — AMMONIA: Ammonia: 17 umol/L (ref 9–35)

## 2023-05-26 LAB — C-PEPTIDE: C-Peptide: 3.8 ng/mL (ref 1.1–4.4)

## 2023-05-26 LAB — INSULIN, RANDOM: Insulin: 4.5 u[IU]/mL (ref 2.6–24.9)

## 2023-05-26 LAB — MRSA NEXT GEN BY PCR, NASAL: MRSA by PCR Next Gen: NOT DETECTED

## 2023-05-26 LAB — HIV ANTIBODY (ROUTINE TESTING W REFLEX): HIV Screen 4th Generation wRfx: NONREACTIVE

## 2023-05-26 MED ORDER — TRAMADOL HCL 50 MG PO TABS
50.0000 mg | ORAL_TABLET | Freq: Once | ORAL | Status: AC
  Administered 2023-05-26: 50 mg via ORAL
  Filled 2023-05-26: qty 1

## 2023-05-26 MED ORDER — DEXTROSE 50 % IV SOLN
INTRAVENOUS | Status: AC
  Administered 2023-05-26: 50 mL
  Filled 2023-05-26: qty 50

## 2023-05-26 MED ORDER — HYDRALAZINE HCL 20 MG/ML IJ SOLN
10.0000 mg | Freq: Four times a day (QID) | INTRAMUSCULAR | Status: DC | PRN
  Administered 2023-05-26 – 2023-05-28 (×3): 10 mg via INTRAVENOUS
  Filled 2023-05-26 (×3): qty 1

## 2023-05-26 MED ORDER — LEVOTHYROXINE SODIUM 25 MCG PO TABS
50.0000 ug | ORAL_TABLET | Freq: Every day | ORAL | Status: DC
  Administered 2023-05-26 – 2023-05-28 (×3): 50 ug via ORAL
  Filled 2023-05-26 (×3): qty 2

## 2023-05-26 MED ORDER — DEXTROSE 50 % IV SOLN: 50.0000 mL | Freq: Once | INTRAVENOUS | Status: DC

## 2023-05-26 NOTE — Progress Notes (Addendum)
PROGRESS NOTE  Rick Williams DOB: October 23, 1965 DOA: 05/25/2023 PCP: Patient, No Pcp Per  Brief History:  57 year old male with a history of ICH (2014), alcohol abuse, hypertension, CHF, hypertension presenting from [facility] jail secondary to altered mental status.  The patient is a difficult historian.  History is obtained from review of the medical record and speaking with deputy sheriff.  The patient has been in [facility] since his discharge from [facility] on 05/22/23.  He was admitted 8/27 to 9/1 for 5 days at [facility]- ICU also for altered mental status and hypoglycemia with AKI, started on fluids and D10.  For some reason at this time, there are no details in Care Everywhere, but there is a stack of papers at bedside-which includes initial ED summary and initial HPI by admitting physician.  Blood sugar on arrival to their ED was 44, head CT was showed age-indeterminate basal ganglia and thalamic infarcts.  Per notes prior to arrival at ED, patient had been refusing food and medications- but might have been 2/2  altered mental status. Discharge summary, details of hospitalization and subsequent workup are not present or available.  The patient normally is able to converse and ambulate and is independent at baseline.  The patient returned from court on 05/25/2023 at which time, he was noted to be low bit more somnolent.  Apparently the patient ate some lunch and thereafter became increasingly somnolent and difficult to arouse.  Apparently he was drooling.  According to the [facility] who was present, the patient would arouse to painful stimuli and just mumble incomprehensibly.  There is no bowel or bladder incontinence.  The patient did not bite his tongue.  At that point, the decision was made to transport the patient to emergency department.  According to the [facility], the patient was able to stand on his own power to transfer to the  [place] to transport him although he remained quite somnolent.  The patient remained somnolent after arrival to the ED requiring protopathic stimuli to arouse him.   In the ED, the patient was hypoglycemic despite being given D50 x 2.  He was placed on D10W infusion.  He was admitted for further evaluation and treatment.  He was hypothermic but hemodynamically stable.  WBC 10.2, hemoglobin 13.7, platelets 283,000.  Sodium 139, potassium 4.6, bicarbonate 24, serum creatinine 1.67.  Upon evaluating the patient in the morning of 05/26/2023, the patient is awake and conversant.  He follows commands and seems to be near his baseline according to the [employer].  The patient denies any history of seizures, but Keppra remains on his MAR.  The patient does not recall the events of the day of admission.  He states that he has not drank any alcohol in 3 weeks and has not smoked tobacco in 2 weeks.  He denies any illicit drugs. Patient denies fevers, chills, headache, chest pain, dyspnea, nausea, vomiting, diarrhea, abdominal pain, dysuria, hematuria, hematochezia, and melena.     Assessment/Plan: Acute Metabolic Encephalopathy -Likely secondary to hypoglycemia -There is some concern about possible seizure -EEG -MRI brain negative for acute findings -B12 765 -TSH 8.529 -Folic acid 14.2 -UA negative for pyuria -Alcohol level undetectable -Continue keppra for now -ammonia  Hypoglycemia -Advance diet and wean off D10W drip -C-peptide 3.8 -Insulin level 4.5 (normal) -Check cortisol level  Hypothermia -blood cultures neg -TSH 8.529 -lactic peaked 1.0 -improved -UA neg for pyuria -  cortisol level -PCT <0.10  Acute kidney injury Presented with serum creatinine 1.67 -Unclear renal baseline although had serum creatinine 0.94 on 01/23/2020 -Continue IV fluids -Renal ultrasound -Hold losartan/hydrochlorothiazide  Essential hypertension -Holding clonidine and losartan hydrochlorothiazide -Holding  amlodipine  Chronic diastolic CHF -Appears clinically dry -Last echo 01/2020 EF of 65 to 70% with grade 1 DD.  -He is not on any diuretics  Alcohol abuse Has been incarcerated.  Blood alcohol level less than 10. -IV Thiamine 100mg  daily  Hyponatremia -Likely volume depletion and poor solute intake -Continue IV fluids  Hypothyroidism -Continue Synthroid -TSH 8.529 -Unclear if he has been receiving his Synthroid       Family Communication: no  Family at bedside  Consultants:  neurology  Code Status:  FULL   DVT Prophylaxis:   Heparin    Procedures: As Listed in Progress Note Above  Antibiotics: None       Subjective: Patient denies fevers, chills, headache, chest pain, dyspnea, nausea, vomiting, diarrhea, abdominal pain, dysuria,   Objective: Vitals:   05/26/23 0500 05/26/23 0700 05/26/23 0745 05/26/23 0800  BP: 126/87 (!) 141/92  (!) 140/105  Pulse:  83  83  Resp: (!) 23     Temp:   97.6 F (36.4 C)   TempSrc:   Oral   SpO2:  94%  94%  Weight:      Height:        Intake/Output Summary (Last 24 hours) at 05/26/2023 1030 Last data filed at 05/26/2023 0925 Gross per 24 hour  Intake 1146.41 ml  Output 650 ml  Net 496.41 ml   Weight change:  Exam:  General:  Pt is alert, follows commands appropriately, not in acute distress HEENT: No icterus, No thrush, No neck mass, [place]/AT Cardiovascular: RRR, S1/S2, no rubs, no gallops Respiratory: CTA bilaterally, no wheezing, no crackles, no rhonchi Abdomen: Soft/+BS, non tender, non distended, no guarding Extremities: No edema, No lymphangitis, No petechiae, No rashes, no synovitis Neuro:  CN II-XII intact, strength 4/5 in RUE, RLE, strength 4/5 LUE, LLE; sensation intact bilateral; no dysmetria; babinski equivocal    Data Reviewed: I have personally reviewed following labs and imaging studies Basic Metabolic Panel: Recent Labs  Lab 05/25/23 1259 05/26/23 0454  NA 129* 131*  K 4.6 3.8  CL 95* 99   CO2 24 22  GLUCOSE 93 100*  BUN 32* 28*  CREATININE 1.67* 1.64*  CALCIUM 9.3 8.4*   Liver Function Tests: Recent Labs  Lab 05/25/23 1259  AST 30  ALT 18  ALKPHOS 71  BILITOT 0.7  PROT 9.3*  ALBUMIN 3.4*   No results for input(s): "LIPASE", "AMYLASE" in the last 168 hours. No results for input(s): "AMMONIA" in the last 168 hours. Coagulation Profile: No results for input(s): "INR", "PROTIME" in the last 168 hours. CBC: Recent Labs  Lab 05/25/23 1259 05/26/23 0454  WBC 10.2 9.1  NEUTROABS 6.6  --   HGB 13.7 11.5*  HCT 41.7 33.8*  MCV 98.6 96.3  PLT 283 233   Cardiac Enzymes: No results for input(s): "CKTOTAL", "CKMB", "CKMBINDEX", "TROPONINI" in the last 168 hours. BNP: Invalid input(s): "POCBNP" CBG: Recent Labs  Lab 05/26/23 0232 05/26/23 0436 05/26/23 0603 05/26/23 0823 05/26/23 1025  GLUCAP 113* 88 129* 98 99   HbA1C: No results for input(s): "HGBA1C" in the last 72 hours. Urine analysis:    Component Value Date/Time   COLORURINE YELLOW 05/25/2023 1925   APPEARANCEUR HAZY (A) 05/25/2023 1925   LABSPEC 1.014 05/25/2023 1925  PHURINE 6.0 05/25/2023 1925   GLUCOSEU 50 (A) 05/25/2023 1925   HGBUR NEGATIVE 05/25/2023 1925   BILIRUBINUR NEGATIVE 05/25/2023 1925   KETONESUR NEGATIVE 05/25/2023 1925   PROTEINUR 100 (A) 05/25/2023 1925   UROBILINOGEN 0.2 02/08/2013 1605   NITRITE NEGATIVE 05/25/2023 1925   LEUKOCYTESUR NEGATIVE 05/25/2023 1925   Sepsis Labs: @LABRCNTIP (procalcitonin:4,lacticidven:4) ) Recent Results (from the past 240 hour(s))  Culture, blood (routine x 2)     Status: None (Preliminary result)   Collection Time: 05/25/23  3:59 PM   Specimen: BLOOD LEFT ARM  Result Value Ref Range Status   Specimen Description BLOOD LEFT ARM AEROBIC BOTTLE ONLY  Final   Special Requests   Final    Blood Culture results may not be optimal due to an inadequate volume of blood received in culture bottles   Culture   Final    NO GROWTH < 24  HOURS Performed at [facility], [address], [place], [place] [address]    Report Status PENDING  Incomplete  Culture, blood (routine x 2)     Status: None (Preliminary result)   Collection Time: 05/25/23  3:59 PM   Specimen: Right Antecubital; Blood  Result Value Ref Range Status   Specimen Description RIGHT ANTECUBITAL  Final   Special Requests   Final    BOTTLES DRAWN AEROBIC AND ANAEROBIC Blood Culture adequate volume   Culture   Final    NO GROWTH < 24 HOURS Performed at [facility], [address], [place], [place] [address]    Report Status PENDING  Incomplete  MRSA Next Gen by PCR, Nasal     Status: None   Collection Time: 05/25/23  8:34 PM   Specimen: Nasal Mucosa; Nasal Swab  Result Value Ref Range Status   MRSA by PCR Next Gen NOT DETECTED NOT DETECTED Final    Comment: (NOTE) The GeneXpert MRSA Assay (FDA approved for NASAL specimens only), is one component of a comprehensive MRSA colonization surveillance program. It is not intended to diagnose MRSA infection nor to guide or monitor treatment for MRSA infections. Test performance is not FDA approved in patients less than 24 years old. Performed at [facility], [address], [place], [place] [address]      Scheduled Meds:  Chlorhexidine Gluconate Cloth  6 each Topical Daily   thiamine (VITAMIN B1) injection  100 mg Intravenous Daily   Continuous Infusions:  sodium chloride 75 mL/hr at 05/26/23 0205   dextrose 50 mL/hr at 05/26/23 0205   levETIRAcetam 500 mg (05/26/23 0924)    Procedures/Studies: MR BRAIN WO CONTRAST  Result Date: 05/25/2023 CLINICAL DATA:  Initial evaluation for acute encephalopathy, new lacunar infarct at the left thalamus. EXAM: MRI HEAD WITHOUT CONTRAST TECHNIQUE: Multiplanar, multiecho pulse sequences of the brain and surrounding structures were obtained without intravenous contrast. COMPARISON:  Prior CT from 05/25/2023 and MRI from 01/21/2020. FINDINGS: Brain: Mildly advanced cerebral  atrophy for age. Patchy T2/FLAIR hyperintensity involving the periventricular and deep white matter both cerebral hemispheres, consistent with chronic small vessel ischemic disease, moderately advanced in nature. Remote hemorrhagic infarct involving the right basal ganglia/external capsule with associated encephalomalacia. Chronic left thalamic lacunar infarct, corresponding with abnormality on prior CT. Few additional remote lacunar infarcts noted about the left basal ganglia, pons, and right middle cerebellar peduncle. No evidence for acute or subacute ischemia. Gray-white matter differentiation otherwise maintained. No acute intracranial hemorrhage. Few scattered chronic micro hemorrhages noted, hypertensive in nature. No mass lesion, midline shift or mass effect. Mild  ex vacuo dilatation of the right lateral ventricle related to the chronic right cerebral infarct. No hydrocephalus. No extra-axial fluid collection. Pituitary gland suprasellar region within normal limits. Vascular: Loss of normal flow void within the intradural left V4 segment, which could reflect slow flow and/or occlusion (series 10, image 3). Major intracranial vascular flow voids are otherwise maintained. Skull and upper cervical spine: Craniocervical junction within normal limits. Bone marrow signal intensity normal. No scalp soft tissue abnormality. Sinuses/Orbits: Globes orbital soft tissues within normal limits. Paranasal sinuses are largely clear. No mastoid effusion. Other: None. IMPRESSION: 1. No acute intracranial abnormality. 2. Remote hemorrhagic infarct involving the right basal ganglia/external capsule. 3. Underlying atrophy with moderately advanced chronic microvascular ischemic disease, with a few scattered remote lacunar infarcts as above. One of these involves the left thalamus, corresponding with abnormality on prior CT. 4. Loss of normal flow void within the intradural left V4 segment, which could reflect slow flow and/or  occlusion. Electronically Signed   By: Rise Mu M.D.   On: 05/25/2023 19:51   DG CHEST PORT 1 VIEW  Result Date: 05/25/2023 CLINICAL DATA:  Acute encephalopathy.  Altered mental status. EXAM: PORTABLE CHEST 1 VIEW COMPARISON:  12/20/2018 FINDINGS: Mild cardiac enlargement. No vascular congestion, edema, or consolidation. Scarring in the lung apices. No pleural effusions. No pneumothorax. Mediastinal contours appear intact. IMPRESSION: Mild cardiac enlargement.  No evidence of active pulmonary disease. Electronically Signed   By: Burman Nieves M.D.   On: 05/25/2023 19:14   CT Head Wo Contrast  Result Date: 05/25/2023 CLINICAL DATA:  Provided history: Delirium. Additional history provided: Drooling, slow to respond, altered mental status. EXAM: CT HEAD WITHOUT CONTRAST TECHNIQUE: Contiguous axial images were obtained from the base of the skull through the vertex without intravenous contrast. RADIATION DOSE REDUCTION: This exam was performed according to the departmental dose-optimization program which includes automated exposure control, adjustment of the mA and/or kV according to patient size and/or use of iterative reconstruction technique. COMPARISON:  Brain MRI 01/21/2020. Prior head CT examinations 01/19/2020 and earlier. FINDINGS: Brain: Generalized cerebral atrophy. Lacunar infarct within the left thalamus, new from the prior brain MRI of 01/20/2019 but otherwise age-indeterminate. Redemonstrated chronic infarct within the right corona radiata, basal ganglia, external capsule and insula. Background moderate patchy and ill-defined hypoattenuation within the cerebral white matter. There is no acute intracranial hemorrhage. No extra-axial fluid collection. No evidence of an intracranial mass. No midline shift. Vascular: No hyperdense vessel.  Atherosclerotic calcifications. Skull: No calvarial fracture or aggressive osseous lesion. Sinuses/Orbits: No mass or acute finding within the imaged  orbits. Minimal secretions within the left maxillary sinus at the imaged levels. IMPRESSION: 1. Lacunar infarct within the left thalamus, new from the prior brain MRI of 01/21/2020 but otherwise age-indeterminate. A brain MRI may be obtained for further evaluation, as clinically warranted. 2. Redemonstrated chronic infarct within the right corona radiata, basal ganglia, external capsule and insula. 3. Background moderate, nonspecific cerebral white matter disease. 4. Generalized cerebral atrophy. Electronically Signed   By: Jackey Loge D.O.   On: 05/25/2023 16:04    Catarina Hartshorn, DO  [facility]  If 7PM-7AM, please contact night-coverage [email or web address] Password [email or web address] 05/26/2023, 10:30 AM   LOS: 1 day

## 2023-05-26 NOTE — Plan of Care (Signed)

## 2023-05-26 NOTE — Progress Notes (Signed)
Brief summary [facility]  The patient was admitted from 05/17/2023 to 05/22/2023.  In the 48 hours prior to presentation, the patient had stopped eating.  He developed altered mental status and a nurse at his jail checked his blood sugar and found it to be 20.  Upon arrival to the ED, his blood glucose level was 20.  He received D50 boluses, but would become hypoglycemic again.  He was started on D10 drip and transferred to the ICU.  The day after admission, the patient was more alert and responsive.  He was tolerating a p.o. diet.  Subsequently the following day, his D10 was stopped and his p.o. diet was continued.  He did experience some hyponatremia with a nadir of 125.  Normal saline was added to his D10.  He did require 3% saline for 1 overnight.  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  Serum awesome was 270 with urine awesome 310.  Urine sodium was 99.  Apparently his cortisol level was normal.  His TSH was 11.22 and free T40.8.  He was started on Synthroid 25 mcg daily during his hospitalization.  He was noted to have elevated serum creatinine up to 1.7.  With IV fluids improved to 1.5.  It was noted during his hospital stay the that the patient was a poor historian with short-term memory loss.  It was unclear if this was his baseline.  The patient did not exhibit any signs of alcohol withdrawal.  He was ultimately started back on his amlodipine 10 mg daily, Imdur 30 mg daily, losartan/HCTZ, clonidine 0.2 mg twice daily HCTZ, and Coreg 6.25 mg twice daily.  The patient was started on Augmentin for community-acquired pneumonia.  Chest x-ray showed a right lower lobe consolidation.  Thyroid studies at that time were consistent with subclinical hypothyroidism, not concerning for myxedema.  He was continued on his Keppra.  DTat

## 2023-05-26 NOTE — Progress Notes (Signed)
Hypoglycemic Event  CBG: 57  Treatment: 8 oz juice/soda/ D50  Symptoms: None  Follow-up CBG: Time:1621 CBG Result:122  Possible Reasons for Event: Unknown  Comments/MD notified:yes    Rick Williams

## 2023-05-26 NOTE — Hospital Course (Addendum)
57 year old male with a history of ICH (2014), alcohol abuse, hypertension, CHF, hypertension presenting from [facility] jail secondary to altered mental status.  The patient is a difficult historian.  History is obtained from review of the medical record and speaking with deputy sheriff.  The patient has been in [facility] since his [facility] on 05/22/23.  He was admitted 8/27 to 9/1 for 5 days at [facility]- ICU also for altered mental status and hypoglycemia with AKI, started on fluids and D10.  For some reason at this time, there are no details in Care Everywhere, but there is a stack of papers at bedside-which includes initial ED summary and initial HPI by admitting physician.  Blood sugar on arrival to their ED was 44, head CT was showed age-indeterminate basal ganglia and thalamic infarcts.  Per notes prior to arrival at ED, patient had been refusing food and medications- but might have been 2/2  altered mental status. Discharge summary, details of hospitalization and subsequent workup are not present or available.  The patient normally is able to converse and ambulate and is independent at baseline.  The patient returned from court on 05/25/2023 at which time, he was noted to be low bit more somnolent.  Apparently the patient ate some lunch and thereafter became increasingly somnolent and difficult to arouse.  Apparently he was drooling.  According to the [facility] who was present, the patient would arouse to painful stimuli and just mumble incomprehensibly.  There is no bowel or bladder incontinence.  The patient did not bite his tongue.  At that point, the decision was made to transport the patient to emergency department.  According to the [facility], the patient was able to stand on his own power to transfer to the [place] to transport him although he remained quite somnolent.  The patient remained somnolent after arrival to the ED requiring protopathic  stimuli to arouse him.   In the ED, the patient was hypoglycemic despite being given D50 x 2.  He was placed on D10W infusion.  He was admitted for further evaluation and treatment.  He was hypothermic but hemodynamically stable.  WBC 10.2, hemoglobin 13.7, platelets 283,000.  Sodium 139, potassium 4.6, bicarbonate 24, serum creatinine 1.67.  Upon evaluating the patient in the morning of 05/26/2023, the patient is awake and conversant.  He follows commands and seems to be near his baseline according to the [employer].  The patient denies any history of seizures, but Keppra remains on his MAR.  The patient does not recall the events of the day of admission.  He states that he has not drank any alcohol in 3 weeks and has not smoked tobacco in 2 weeks.  He denies any illicit drugs. Patient denies fevers, chills, headache, chest pain, dyspnea, nausea, vomiting, diarrhea, abdominal pain, dysuria, hematuria, hematochezia, and melena.  The patient was started on a D10W drip.  His blood sugars eventually stabilized after 24 hours.  His D10 drip was subsequently discontinued and the patient was monitored.  His diet was liberalized.  He CBGs stabilized without any further episodes of hypoglycemia.  EEG was negative for seizure.  The patient was seen by neurology and felt that hyperglycemia may certainly have precipitated seizure.  In such a case, the patient likely had a prolonged recovery secondary to his poor neurologic reserve.  In addition, the patient later revealed that he had been eating very poorly or not eating at all.  I suspect  the patient likely has poor glycogen reserves.  Certainly, the patient may have ingested some type of toxic substance contributing to his encephalopathy and hypoglycemia.  Nevertheless, the patient's sugars improved and stabilized.  His renal function stabilized, and his serum creatinine was 1.25 on the day of discharge.  Serum sodium was 130 on the day of discharge.

## 2023-05-26 NOTE — Progress Notes (Signed)
EEG complete - results pending 

## 2023-05-26 NOTE — Discharge Instructions (Signed)
  Providers Accepting New Patients in [place], [place]    [facility] [address]  [place], [place] [address] [phone] Accepts most insurances  [facility] [address] [place], [place] [address] [phone] Accepts most insurances  [facility] [place] [facility] [address] [place], [place] [address]  [phone] Must meet requirements  [facility] [address]  [place], [place] [address] [phone] Accepts most insurances  [facility] [address]  [place], [place] [address] [phone] Accepts most insurances  [facility] [address]   [place], [place]   [phone] Accepts most insurances  [facility] ([facility])  [address]  [place], [place] [address] [phone] Accepts most insurances     [facility] [address]  [place], [place] [address] [phone] Accepts most insurances  [facility] [address] [place], [place] [address] [phone] option 1 Accepts Medicaid and Uninsured  [facility] [address]  [place], [place] [address] [phone] Accepts most insurances  Tesfaye Fanta, MD [address]  [place], [place] [address] [phone] Accepts most insurances  [facility] [place] [address]  [place], [place] [address] [phone] Accepts most insurances  [facility] [address] [place],  [address] [phone] Accepts most insurances  Zack Hall, MD [address] [place],  [address] [phone]  Accepts most insurances                      

## 2023-05-26 NOTE — Progress Notes (Signed)
   05/26/23 1010  TOC Brief Assessment  Insurance and Status Lapsed  Patient has primary care physician No (List added to AVS)  Home environment has been reviewed Inmate  Prior level of function: independent  Prior/Current Home Services No current home services  Social Determinants of Health Reivew SDOH reviewed no interventions necessary  Transition of care needs no transition of care needs at this time   Patient admitted with acute metabolic encephalopathy. Inmate, resources added. TOC following.

## 2023-05-26 NOTE — Consult Note (Signed)
I connected with  [name] on 05/26/23 by a video enabled telemedicine application and verified that I am speaking with the correct person using two identifiers.   I discussed the limitations of evaluation and management by telemedicine. The patient expressed understanding and agreed to proceed.  Location of patient: [facility] Location of physician: [facility]   Neurology Consultation Reason for Consult: Altered mental status Referring Physician: Dr. Onalee Hua Tat  CC: Altered mental status  History is obtained from: Patient, chart review  HPI: Rick Williams is a 57 y.o. male with past medical history of multiple strokes, hypertension, hypothyroidism, alcohol use disorder who was brought in from [facility] jail for altered mental status.  On arrival, his blood glucose was 41.  He was started on D20 and admitted to ICU.  Neurology was consulted today due to prolonged encephalopathy even after correction of hypoglycemia.  Per review of HPI, patient had an admission for hypoglycemia recently at [facility] as well.  However I do not see any records in Care Everywhere.  Patient states he is not sure why his blood sugar keeps dropping.  Denies any recent changes in medications or loss of appetite  Of note on review of medications, patient is on Keppra 500 mg twice daily.  However patient denies any history of seizures and I cannot find any records for the same.  His UDS was also positive for benzos however I did not see any benzos being administered at our hospital.  Unsure if he was given any benzos by EMS.  Lastly, patient does have a history of alcohol use disorder but has been in jail for about 2 weeks.  ROS: All other systems reviewed and negative except as noted in the HPI.    Past Medical History:  Diagnosis Date   Alcohol abuse    Cerebral hemorrhage (HCC) 2014   Hemothorax on left    Hypertension    Pneumonia    Stroke [facility]) 2007    no deficits    Family History  Problem Relation Age of Onset   Hypertension Mother    Hypertension Father     Social History:  reports that he has been smoking cigarettes. He has never used smokeless tobacco. He reports current alcohol use. He reports current drug use. Drug: Marijuana.   Medications Prior to Admission  Medication Sig Dispense Refill Last Dose   ADVAIR DISKUS 250-50 MCG/ACT AEPB Inhale 1 puff into the lungs 2 (two) times daily.   05/25/2023   albuterol (VENTOLIN HFA) 108 (90 Base) MCG/ACT inhaler Inhale 1 puff into the lungs every 4 (four) hours as needed for wheezing or shortness of breath.   05/24/2023   amLODipine (NORVASC) 10 MG tablet Take 10 mg by mouth daily.   05/24/2023   aspirin EC 81 MG EC tablet Take 1 tablet (81 mg total) by mouth daily. 30 tablet 0 05/25/2023 at 0930   carvedilol (COREG) 6.25 MG tablet Take 6.25 mg by mouth 2 (two) times daily.   05/24/2023   cloNIDine (CATAPRES - DOSED IN MG/24 HR) 0.2 mg/24hr patch Place 0.2 mg onto the skin once a week.   05/24/2023   cloNIDine (CATAPRES) 0.2 MG tablet Take 1 tablet (0.2 mg total) by mouth 2 (two) times daily. 60 tablet 0 05/23/2023   clopidogrel (PLAVIX) 75 MG tablet Take 75 mg by mouth daily.   05/24/2023 at 2330   fluticasone (FLONASE) 50 MCG/ACT nasal spray Place 1 spray into both  nostrils daily.   05/24/2023   folic acid (FOLVITE) 1 MG tablet Take 1 tablet (1 mg total) by mouth daily. 30 tablet 0 05/24/2023   hydrOXYzine (VISTARIL) 25 MG capsule Take 25 mg by mouth 3 (three) times daily.   05/25/2023   levETIRAcetam (KEPPRA) 500 MG tablet Take 500 mg by mouth 2 (two) times daily.   Past Week   levothyroxine (SYNTHROID) 25 MCG tablet Take 25 mcg by mouth daily.   05/25/2023   losartan-hydrochlorothiazide (HYZAAR) 100-12.5 MG tablet Take 1 tablet by mouth daily.   05/23/2023   mirtazapine (REMERON SOL-TAB) 15 MG disintegrating tablet Take 15 mg by mouth at bedtime.   05/23/2023   Multiple Vitamin (DAILY-VITE) TABS Take 1 tablet  by mouth daily.   05/25/2023   nicotine (NICODERM CQ - DOSED IN MG/24 HOURS) 21 mg/24hr patch Place 21 mg onto the skin daily.   05/23/2023   thiamine 100 MG tablet Take 1 tablet (100 mg total) by mouth daily. 30 tablet 0 05/25/2023      Exam: Current vital signs: BP (!) 140/105   Pulse 83   Temp 97.6 F (36.4 C) (Oral)   Resp (!) 23   Ht 5\' 9"  (1.753 m)   Wt 54.2 kg   SpO2 94%   BMI 17.65 kg/m  Vital signs in last 24 hours: Temp:  [93.9 F (34.4 C)-99 F (37.2 C)] 97.6 F (36.4 C) (09/05 0745) Pulse Rate:  [55-85] 83 (09/05 0800) Resp:  [11-23] 23 (09/05 0500) BP: (92-144)/(73-108) 140/105 (09/05 0800) SpO2:  [91 %-100 %] 94 % (09/05 0800) Weight:  [54.2 kg-70 kg] 54.2 kg (09/04 2035)   Physical Exam  Constitutional: Appears well-developed and well-nourished.  Psych: Affect appropriate to situation Neuro: AAO x 2, not oriented to time), no aphasia, no dysarthria, cranial nerves II to XII grossly intact except has significant cataract and therefore vision exam was limited via tele, antigravity strength in all 4 extremities without drift, reports sensation intact to light touch but hands still cold bilaterally, FTN intact bilaterally  I have reviewed labs in epic and the results pertinent to this consultation are: CBC:  Recent Labs  Lab 05/25/23 1259 05/26/23 0454  WBC 10.2 9.1  NEUTROABS 6.6  --   HGB 13.7 11.5*  HCT 41.7 33.8*  MCV 98.6 96.3  PLT 283 233    Basic Metabolic Panel:  Lab Results  Component Value Date   NA 131 (L) 05/26/2023   K 3.8 05/26/2023   CO2 22 05/26/2023   GLUCOSE 100 (H) 05/26/2023   BUN 28 (H) 05/26/2023   CREATININE 1.64 (H) 05/26/2023   CALCIUM 8.4 (L) 05/26/2023   GFRNONAA 49 (L) 05/26/2023   GFRAA >60 01/23/2020   Lipid Panel:  Lab Results  Component Value Date   LDLCALC 92 01/20/2020   HgbA1c:  Lab Results  Component Value Date   HGBA1C 5.8 (H) 01/20/2020   Urine Drug Screen:     Component Value Date/Time   LABOPIA  NONE DETECTED 05/25/2023 1925   COCAINSCRNUR NONE DETECTED 05/25/2023 1925   LABBENZ POSITIVE (A) 05/25/2023 1925   AMPHETMU NONE DETECTED 05/25/2023 1925   THCU NONE DETECTED 05/25/2023 1925   LABBARB NONE DETECTED 05/25/2023 1925    Alcohol Level     Component Value Date/Time   ETH <10 05/25/2023 1259     I have reviewed the images obtained: MRI brain without contrast 05/25/23: No acute abnormality.  Remote hemorrhagic infarct involving right basal ganglia/external capsule.  Underlying  atrophy with moderately advanced chronic microvascular ischemic disease with few scattered remote lacunar infarcts.  1 of these involve left thalamus corresponding with abnormality on prior CT.  Loss of normal flow void within the intradural left V4 segment which could reflect slow flow and or occlusion.  MRI brain with and without contrast 01/21/2020: Unchanged distribution of white matter lesions in a pattern consistent with multiple sclerosis. No active demyelinating lesions. No demyelinating lesions of the cervical spinal cord.   ASSESSMENT/PLAN: 57 year old male with multiple strokes as noted in HPI presented with altered mental status in the setting of hypoglycemia (CBG 41).  UDS positive for benzos  Acute encephalopathy - Most likely secondary to hypoglycemia.  UDS was positive for benzos which in the setting of poor neurological reserve with prior strokes and alcohol use disorder could have prolonged hypoglycemic state. -Patient denies any history of seizures but is already on Keppra.  He does have increased risk of seizures due to his hemorrhagic infarcts and history of alcohol use.  It is unclear if he had any seizure prior to this admission as it was not witnessed.  There was no leukocytosis on admission.  CK is pending.  However even if he did have a seizure, it was most likely provoked in the setting of hypoglycemia -Therefore, recommend continuing Keppra 500 mg twice daily for now.  EEG is ordered  and pending but will likely not change management unless shows seizures -Defer workup of recurrent hypoglycemia to medicine team -No further workup required from neurology standpoint as long as patient continues to improve -Plan discussed with patient as well as Dr. Arbutus Leas via secure chat   Thank you for allowing [place] to participate in the care of this patient. If you have any further questions, please contact  me or neurohospitalist.   Lindie Spruce [facility] neuro[facility]ist

## 2023-05-26 NOTE — Progress Notes (Signed)
Note and medical record release signed by patient faxed to [facility] Records Department per Dr Tat request. Fax machine confirmed OK status on fax. Received records via fax back from [facility] and all paperwork/records given to Dr Tat to review.

## 2023-05-26 NOTE — Procedures (Signed)
Patient Name: Rick Williams  MRN: [id number]  Epilepsy Attending: Charlsie Quest  Referring Physician/Provider: Catarina Hartshorn, MD  Date: 05/26/2023 Duration: 22.16 mins  Patient history: 57yo m with ams getting eeg to evaluate for seizure  Level of alertness: Awake, asleep  AEDs during EEG study: LEV  Technical aspects: This EEG study was done with scalp electrodes positioned according to the 10-20 International system of electrode placement. Electrical activity was reviewed with band pass filter of 1-70Hz , sensitivity of 7 uV/mm, display speed of 51mm/sec with a 60Hz  notched filter applied as appropriate. EEG data were recorded continuously and digitally stored.  Video monitoring was available and reviewed as appropriate.  Description: The posterior dominant rhythm consists of 7 Hz activity of moderate voltage (25-35 uV) seen predominantly in posterior head regions, symmetric and reactive to eye opening and eye closing. Sleep was characterized by vertex waves, sleep spindles (12 to 14 Hz), maximal frontocentral region.  EEG showed continuous generalized 5 to 7 Hz theta slowing. Hyperventilation and photic stimulation were not performed.     ABNORMALITY - Continuous slow, generalized  IMPRESSION: This study is suggestive of mild to moderate diffuse encephalopathy. No seizures or epileptiform discharges were seen throughout the recording.  Starletta Houchin Annabelle Harman

## 2023-05-27 DIAGNOSIS — E162 Hypoglycemia, unspecified: Secondary | ICD-10-CM | POA: Diagnosis not present

## 2023-05-27 DIAGNOSIS — G9341 Metabolic encephalopathy: Secondary | ICD-10-CM | POA: Diagnosis not present

## 2023-05-27 DIAGNOSIS — N179 Acute kidney failure, unspecified: Secondary | ICD-10-CM | POA: Diagnosis not present

## 2023-05-27 LAB — GLUCOSE, CAPILLARY
Glucose-Capillary: 100 mg/dL — ABNORMAL HIGH (ref 70–99)
Glucose-Capillary: 104 mg/dL — ABNORMAL HIGH (ref 70–99)
Glucose-Capillary: 105 mg/dL — ABNORMAL HIGH (ref 70–99)
Glucose-Capillary: 108 mg/dL — ABNORMAL HIGH (ref 70–99)
Glucose-Capillary: 111 mg/dL — ABNORMAL HIGH (ref 70–99)
Glucose-Capillary: 113 mg/dL — ABNORMAL HIGH (ref 70–99)
Glucose-Capillary: 128 mg/dL — ABNORMAL HIGH (ref 70–99)
Glucose-Capillary: 51 mg/dL — ABNORMAL LOW (ref 70–99)
Glucose-Capillary: 86 mg/dL (ref 70–99)
Glucose-Capillary: 87 mg/dL (ref 70–99)
Glucose-Capillary: 90 mg/dL (ref 70–99)
Glucose-Capillary: 91 mg/dL (ref 70–99)

## 2023-05-27 LAB — CBC
HCT: 37.4 % — ABNORMAL LOW (ref 39.0–52.0)
Hemoglobin: 12.4 g/dL — ABNORMAL LOW (ref 13.0–17.0)
MCH: 31.9 pg (ref 26.0–34.0)
MCHC: 33.2 g/dL (ref 30.0–36.0)
MCV: 96.1 fL (ref 80.0–100.0)
Platelets: 260 10*3/uL (ref 150–400)
RBC: 3.89 MIL/uL — ABNORMAL LOW (ref 4.22–5.81)
RDW: 13.3 % (ref 11.5–15.5)
WBC: 8.3 10*3/uL (ref 4.0–10.5)
nRBC: 0 % (ref 0.0–0.2)

## 2023-05-27 LAB — OSMOLALITY: Osmolality: 288 mosm/kg (ref 275–295)

## 2023-05-27 LAB — BASIC METABOLIC PANEL
Anion gap: 7 (ref 5–15)
BUN: 22 mg/dL — ABNORMAL HIGH (ref 6–20)
CO2: 25 mmol/L (ref 22–32)
Calcium: 8.5 mg/dL — ABNORMAL LOW (ref 8.9–10.3)
Chloride: 98 mmol/L (ref 98–111)
Creatinine, Ser: 1.38 mg/dL — ABNORMAL HIGH (ref 0.61–1.24)
GFR, Estimated: >60 mL/min (ref >60)
Glucose, Bld: 90 mg/dL (ref 70–99)
Potassium: 4 mmol/L (ref 3.5–5.1)
Sodium: 130 mmol/L — ABNORMAL LOW (ref 135–145)

## 2023-05-27 LAB — CREATININE, URINE, RANDOM: Creatinine, Urine: 41 mg/dL

## 2023-05-27 LAB — COMPREHENSIVE METABOLIC PANEL
ALT: 14 U/L (ref 0–44)
AST: 25 U/L (ref 15–41)
Albumin: 2.6 g/dL — ABNORMAL LOW (ref 3.5–5.0)
Alkaline Phosphatase: 59 U/L (ref 38–126)
Anion gap: 10 (ref 5–15)
BUN: 22 mg/dL — ABNORMAL HIGH (ref 6–20)
CO2: 21 mmol/L — ABNORMAL LOW (ref 22–32)
Calcium: 8.3 mg/dL — ABNORMAL LOW (ref 8.9–10.3)
Chloride: 97 mmol/L — ABNORMAL LOW (ref 98–111)
Creatinine, Ser: 1.36 mg/dL — ABNORMAL HIGH (ref 0.61–1.24)
GFR, Estimated: >60 mL/min (ref >60)
Glucose, Bld: 98 mg/dL (ref 70–99)
Potassium: 4 mmol/L (ref 3.5–5.1)
Sodium: 128 mmol/L — ABNORMAL LOW (ref 135–145)
Total Bilirubin: 0.6 mg/dL (ref 0.3–1.2)
Total Protein: 7.5 g/dL (ref 6.5–8.1)

## 2023-05-27 LAB — SODIUM, URINE, RANDOM: Sodium, Ur: 77 mmol/L

## 2023-05-27 LAB — MAGNESIUM: Magnesium: 1.5 mg/dL — ABNORMAL LOW (ref 1.7–2.4)

## 2023-05-27 LAB — T3, FREE: T3, Free: 1.7 pg/mL — ABNORMAL LOW (ref 2.0–4.4)

## 2023-05-27 LAB — CORTISOL-AM, BLOOD: Cortisol - AM: 10.3 ug/dL (ref 6.7–22.6)

## 2023-05-27 MED ORDER — MAGNESIUM SULFATE 2 GM/50ML IV SOLN
2.0000 g | Freq: Once | INTRAVENOUS | Status: AC
  Administered 2023-05-27: 2 g via INTRAVENOUS
  Filled 2023-05-27: qty 50

## 2023-05-27 MED ORDER — SODIUM CHLORIDE 0.9 % IV SOLN: INTRAVENOUS | Status: AC

## 2023-05-27 MED ORDER — AMLODIPINE BESYLATE 5 MG PO TABS
10.0000 mg | ORAL_TABLET | Freq: Every day | ORAL | Status: DC
  Administered 2023-05-27 – 2023-05-28 (×2): 10 mg via ORAL
  Filled 2023-05-27 (×2): qty 2

## 2023-05-27 MED ORDER — COSYNTROPIN 0.25 MG IJ SOLR
0.2500 mg | Freq: Once | INTRAMUSCULAR | Status: AC
  Administered 2023-05-28: 0.25 mg via INTRAVENOUS
  Filled 2023-05-27: qty 0.25

## 2023-05-27 NOTE — Progress Notes (Signed)
PROGRESS NOTE  Rick Williams ZOX:[id number] DOB: 1966-03-08 DOA: 05/25/2023 PCP: Patient, No Pcp Per  Brief History:  57 year old male with a history of ICH (2014), alcohol abuse, hypertension, CHF, hypertension presenting from [facility] jail secondary to altered mental status.  The patient is a difficult historian.  History is obtained from review of the medical record and speaking with deputy sheriff.  The patient has been in [facility] since his discharge from [facility] on 05/22/23.  He was admitted 8/27 to 9/1 for 5 days at [facility]- ICU also for altered mental status and hypoglycemia with AKI, started on fluids and D10.  For some reason at this time, there are no details in Care Everywhere, but there is a stack of papers at bedside-which includes initial ED summary and initial HPI by admitting physician.  Blood sugar on arrival to their ED was 44, head CT was showed age-indeterminate basal ganglia and thalamic infarcts.  Per notes prior to arrival at ED, patient had been refusing food and medications- but might have been 2/2  altered mental status. Discharge summary, details of hospitalization and subsequent workup are not present or available.  The patient normally is able to converse and ambulate and is independent at baseline.  The patient returned from court on 05/25/2023 at which time, he was noted to be low bit more somnolent.  Apparently the patient ate some lunch and thereafter became increasingly somnolent and difficult to arouse.  Apparently he was drooling.  According to the [facility] who was present, the patient would arouse to painful stimuli and just mumble incomprehensibly.  There is no bowel or bladder incontinence.  The patient did not bite his tongue.  At that point, the decision was made to transport the patient to emergency department.  According to the [facility], the patient was able to stand on his own power to transfer to the  [place] to transport him although he remained quite somnolent.  The patient remained somnolent after arrival to the ED requiring protopathic stimuli to arouse him.   In the ED, the patient was hypoglycemic despite being given D50 x 2.  He was placed on D10W infusion.  He was admitted for further evaluation and treatment.  He was hypothermic but hemodynamically stable.  WBC 10.2, hemoglobin 13.7, platelets 283,000.  Sodium 139, potassium 4.6, bicarbonate 24, serum creatinine 1.67.  Upon evaluating the patient in the morning of 05/26/2023, the patient is awake and conversant.  He follows commands and seems to be near his baseline according to the [employer].  The patient denies any history of seizures, but Keppra remains on his MAR.  The patient does not recall the events of the day of admission.  He states that he has not drank any alcohol in 3 weeks and has not smoked tobacco in 2 weeks.  He denies any illicit drugs. Patient denies fevers, chills, headache, chest pain, dyspnea, nausea, vomiting, diarrhea, abdominal pain, dysuria, hematuria, hematochezia, and melena.     Assessment/Plan:  Acute Metabolic Encephalopathy -Likely secondary to hypoglycemia -There is some concern about possible seizure -EEG--mild to mod encephalopathy -MRI brain negative for acute findings -B12 765 -TSH 8.529 -Folic acid 14.2 -UA negative for pyuria -Alcohol level undetectable -Continue keppra for now -ammonia--17 -overall mental status improved, but has poor neurologic reserve and suspect may have a degree of underlying MCI   Hypoglycemia -Advance diet and wean off D10W drip -stop  D10 W on 9/6 -C-peptide 3.8 -Insulin level 4.5 (normal) -Check cortisol level-10.3 -suspect some PVD in upper ext altering sugars -validate any hypoglycemic CBGs with venipunture -suspect a degree of relative adrenal insufficiency, precipitated by acute medical illness   Hypothermia -blood cultures neg -TSH 8.529 -lactic  peaked 1.0 -improved -UA neg for pyuria -cortisol level--10.3 -PCT <0.10   Acute kidney injury Presented with serum creatinine 1.67 -Unclear renal baseline although had serum creatinine 0.94 on 01/23/2020 -Continue IV fluids -Renal ultrasound--negative for hydronephrosis; mildly echogenic kidneys -Hold losartan/hydrochlorothiazide -suspect pt has underlying CKD   Essential hypertension -Holding clonidine and losartan hydrochlorothiazide -restart amlodipine -will not restart hydrochlorothiazide due to recurrent hyponatremia   Chronic diastolic CHF -Appears clinically dry -Last echo 01/2020 EF of 65 to 70% with grade 1 DD.  -He is not on any diuretics   Alcohol abuse Has been incarcerated.  Blood alcohol level less than 10. -IV Thiamine 100mg  daily   Hyponatremia -Likely volume depletion and poor solute intake and hypotonic fluid from D10W -Stop D10, start NS x 18 hours -urine osm -serum osm -am cortisol 10.3   Hypothyroidism -Continue Synthroid -TSH 8.529 -he was started on synthroid at [facility] less than a week ago             Family Communication: no  Family at bedside   Consultants:  neurology   Code Status:  FULL    DVT Prophylaxis:   Heparin      Procedures: As Listed in Progress Note Above   Antibiotics: None      Subjective:  Patient denies fevers, chills, headache, chest pain, dyspnea, nausea, vomiting, diarrhea, abdominal pain, dysuria, hematuria, hematochezia, and melena.  Objective: Vitals:   05/27/23 0600 05/27/23 0631 05/27/23 0700 05/27/23 0800  BP: (!) 197/165 (!) 151/93 (!) 142/99 (!) 158/107  Pulse:  84 95 95  Resp: (!) 23 13 20 16   Temp:    97.7 F (36.5 C)  TempSrc:    Axillary  SpO2:  96% 97% 96%  Weight:      Height:        Intake/Output Summary (Last 24 hours) at 05/27/2023 1343 Last data filed at 05/27/2023 0800 Gross per 24 hour  Intake 3920.34 ml  Output 2350 ml  Net 1570.34 ml   Weight change:   Exam:  General:  Pt is alert, follows commands appropriately, not in acute distress HEENT: No icterus, No thrush, No neck mass, [place]/AT Cardiovascular: RRR, S1/S2, no rubs, no gallops Respiratory: CTA bilaterally, no wheezing, no crackles, no rhonchi Abdomen: Soft/+BS, non tender, non distended, no guarding Extremities: No edema, No lymphangitis, No petechiae, No rashes, no synovitis   Data Reviewed: I have personally reviewed following labs and imaging studies Basic Metabolic Panel: Recent Labs  Lab 05/25/23 1259 05/26/23 0454 05/26/23 1117 05/27/23 0431  NA 129* 131*  --  128*  K 4.6 3.8  --  4.0  CL 95* 99  --  97*  CO2 24 22  --  21*  GLUCOSE 93 100*  --  98  BUN 32* 28*  --  22*  CREATININE 1.67* 1.64*  --  1.36*  CALCIUM 9.3 8.4*  --  8.3*  MG  --   --  1.7 1.5*   Liver Function Tests: Recent Labs  Lab 05/25/23 1259 05/27/23 0431  AST 30 25  ALT 18 14  ALKPHOS 71 59  BILITOT 0.7 0.6  PROT 9.3* 7.5  ALBUMIN 3.4* 2.6*   No results for input(s): "LIPASE", "  AMYLASE" in the last 168 hours. Recent Labs  Lab 05/26/23 1117  AMMONIA 17   Coagulation Profile: No results for input(s): "INR", "PROTIME" in the last 168 hours. CBC: Recent Labs  Lab 05/25/23 1259 05/26/23 0454 05/27/23 0431  WBC 10.2 9.1 8.3  NEUTROABS 6.6  --   --   HGB 13.7 11.5* 12.4*  HCT 41.7 33.8* 37.4*  MCV 98.6 96.3 96.1  PLT 283 233 260   Cardiac Enzymes: Recent Labs  Lab 05/26/23 1117  CKTOTAL 138   BNP: Invalid input(s): "POCBNP" CBG: Recent Labs  Lab 05/27/23 0558 05/27/23 0621 05/27/23 0812 05/27/23 1010 05/27/23 1215  GLUCAP 51* 104* 108* 90 87   HbA1C: Recent Labs    05/26/23 1117  HGBA1C 6.7*   Urine analysis:    Component Value Date/Time   COLORURINE YELLOW 05/25/2023 1925   APPEARANCEUR HAZY (A) 05/25/2023 1925   LABSPEC 1.014 05/25/2023 1925   PHURINE 6.0 05/25/2023 1925   GLUCOSEU 50 (A) 05/25/2023 1925   HGBUR NEGATIVE 05/25/2023 1925    BILIRUBINUR NEGATIVE 05/25/2023 1925   KETONESUR NEGATIVE 05/25/2023 1925   PROTEINUR 100 (A) 05/25/2023 1925   UROBILINOGEN 0.2 02/08/2013 1605   NITRITE NEGATIVE 05/25/2023 1925   LEUKOCYTESUR NEGATIVE 05/25/2023 1925   Sepsis Labs: @LABRCNTIP (procalcitonin:4,lacticidven:4) ) Recent Results (from the past 240 hour(s))  Culture, blood (routine x 2)     Status: None (Preliminary result)   Collection Time: 05/25/23  3:59 PM   Specimen: BLOOD LEFT ARM  Result Value Ref Range Status   Specimen Description BLOOD LEFT ARM AEROBIC BOTTLE ONLY  Final   Special Requests   Final    Blood Culture results may not be optimal due to an inadequate volume of blood received in culture bottles   Culture   Final    NO GROWTH 2 DAYS Performed at [facility], [address] [place], [place] [address]    Report Status PENDING  Incomplete  Culture, blood (routine x 2)     Status: None (Preliminary result)   Collection Time: 05/25/23  3:59 PM   Specimen: Right Antecubital; Blood  Result Value Ref Range Status   Specimen Description RIGHT ANTECUBITAL  Final   Special Requests   Final    BOTTLES DRAWN AEROBIC AND ANAEROBIC Blood Culture adequate volume   Culture   Final    NO GROWTH 2 DAYS Performed at [facility], [address] [place], [place] [address]    Report Status PENDING  Incomplete  MRSA Next Gen by PCR, Nasal     Status: None   Collection Time: 05/25/23  8:34 PM   Specimen: Nasal Mucosa; Nasal Swab  Result Value Ref Range Status   MRSA by PCR Next Gen NOT DETECTED NOT DETECTED Final    Comment: (NOTE) The GeneXpert MRSA Assay (FDA approved for NASAL specimens only), is one component of a comprehensive MRSA colonization surveillance program. It is not intended to diagnose MRSA infection nor to guide or monitor treatment for MRSA infections. Test performance is not FDA approved in patients less than 32 years old. Performed at [facility], [address] [place], [place] [address]       Scheduled Meds:  amLODipine  10 mg Oral Daily   Chlorhexidine Gluconate Cloth  6 each Topical Daily   dextrose  50 mL Intravenous Once   levothyroxine  50 mcg Oral Q0600   thiamine (VITAMIN B1) injection  100 mg Intravenous Daily   Continuous Infusions:  dextrose 50 mL/hr at  05/27/23 0800   levETIRAcetam 500 mg (05/27/23 0947)    Procedures/Studies: US RENAL  Result Date: 05/26/2023 CLINICAL DATA:  Acute kidney injury EXAM: RENAL / URINARY TRACT ULTRASOUND COMPLETE COMPARISON:  CT report 05/31/2004 FINDINGS: Right Kidney: Renal measurements: 11.5 x 5.3 x 6.3 cm = volume: 201.7 mL. Slightly echogenic cortex. No mass or hydronephrosis Left Kidney: Renal measurements: 9.9 x 4.8 x 4.6 cm = volume: 113.5 mL. Slightly echogenic cortex. No mass or hydronephrosis Bladder: Appears normal for degree of bladder distention. Other: None. IMPRESSION: Slightly echogenic renal cortex bilaterally consistent with medical renal disease. No hydronephrosis. Electronically Signed   By: Jasmine Pang M.D.   On: 05/26/2023 16:50   EEG adult  Result Date: 05/26/2023 Charlsie Quest, MD     05/26/2023  3:52 PM Patient Name: AUNG MOLEY MRN: [id number] Epilepsy Attending: Charlsie Quest Referring Physician/Provider: Catarina Hartshorn, MD Date: 05/26/2023 Duration: 22.16 mins Patient history: 57yo m with ams getting eeg to evaluate for seizure Level of alertness: Awake, asleep AEDs during EEG study: LEV Technical aspects: This EEG study was done with scalp electrodes positioned according to the 10-20 International system of electrode placement. Electrical activity was reviewed with band pass filter of 1-70Hz , sensitivity of 7 uV/mm, display speed of 31mm/sec with a 60Hz  notched filter applied as appropriate. EEG data were recorded continuously and digitally stored.  Video monitoring was available and reviewed as appropriate. Description: The posterior dominant rhythm consists of 7 Hz activity of moderate voltage (25-35  uV) seen predominantly in posterior head regions, symmetric and reactive to eye opening and eye closing. Sleep was characterized by vertex waves, sleep spindles (12 to 14 Hz), maximal frontocentral region.  EEG showed continuous generalized 5 to 7 Hz theta slowing. Hyperventilation and photic stimulation were not performed.   ABNORMALITY - Continuous slow, generalized IMPRESSION: This study is suggestive of mild to moderate diffuse encephalopathy. No seizures or epileptiform discharges were seen throughout the recording. Charlsie Quest   MR BRAIN WO CONTRAST  Result Date: 05/25/2023 CLINICAL DATA:  Initial evaluation for acute encephalopathy, new lacunar infarct at the left thalamus. EXAM: MRI HEAD WITHOUT CONTRAST TECHNIQUE: Multiplanar, multiecho pulse sequences of the brain and surrounding structures were obtained without intravenous contrast. COMPARISON:  Prior CT from 05/25/2023 and MRI from 01/21/2020. FINDINGS: Brain: Mildly advanced cerebral atrophy for age. Patchy T2/FLAIR hyperintensity involving the periventricular and deep white matter both cerebral hemispheres, consistent with chronic small vessel ischemic disease, moderately advanced in nature. Remote hemorrhagic infarct involving the right basal ganglia/external capsule with associated encephalomalacia. Chronic left thalamic lacunar infarct, corresponding with abnormality on prior CT. Few additional remote lacunar infarcts noted about the left basal ganglia, pons, and right middle cerebellar peduncle. No evidence for acute or subacute ischemia. Gray-white matter differentiation otherwise maintained. No acute intracranial hemorrhage. Few scattered chronic micro hemorrhages noted, hypertensive in nature. No mass lesion, midline shift or mass effect. Mild ex vacuo dilatation of the right lateral ventricle related to the chronic right cerebral infarct. No hydrocephalus. No extra-axial fluid collection. Pituitary gland suprasellar region within normal  limits. Vascular: Loss of normal flow void within the intradural left V4 segment, which could reflect slow flow and/or occlusion (series 10, image 3). Major intracranial vascular flow voids are otherwise maintained. Skull and upper cervical spine: Craniocervical junction within normal limits. Bone marrow signal intensity normal. No scalp soft tissue abnormality. Sinuses/Orbits: Globes orbital soft tissues within normal limits. Paranasal sinuses are largely clear. No mastoid effusion. Other: None. IMPRESSION:  1. No acute intracranial abnormality. 2. Remote hemorrhagic infarct involving the right basal ganglia/external capsule. 3. Underlying atrophy with moderately advanced chronic microvascular ischemic disease, with a few scattered remote lacunar infarcts as above. One of these involves the left thalamus, corresponding with abnormality on prior CT. 4. Loss of normal flow void within the intradural left V4 segment, which could reflect slow flow and/or occlusion. Electronically Signed   By: Rise Mu M.D.   On: 05/25/2023 19:51   DG CHEST PORT 1 VIEW  Result Date: 05/25/2023 CLINICAL DATA:  Acute encephalopathy.  Altered mental status. EXAM: PORTABLE CHEST 1 VIEW COMPARISON:  12/20/2018 FINDINGS: Mild cardiac enlargement. No vascular congestion, edema, or consolidation. Scarring in the lung apices. No pleural effusions. No pneumothorax. Mediastinal contours appear intact. IMPRESSION: Mild cardiac enlargement.  No evidence of active pulmonary disease. Electronically Signed   By: Burman Nieves M.D.   On: 05/25/2023 19:14   CT Head Wo Contrast  Result Date: 05/25/2023 CLINICAL DATA:  Provided history: Delirium. Additional history provided: Drooling, slow to respond, altered mental status. EXAM: CT HEAD WITHOUT CONTRAST TECHNIQUE: Contiguous axial images were obtained from the base of the skull through the vertex without intravenous contrast. RADIATION DOSE REDUCTION: This exam was performed according  to the departmental dose-optimization program which includes automated exposure control, adjustment of the mA and/or kV according to patient size and/or use of iterative reconstruction technique. COMPARISON:  Brain MRI 01/21/2020. Prior head CT examinations 01/19/2020 and earlier. FINDINGS: Brain: Generalized cerebral atrophy. Lacunar infarct within the left thalamus, new from the prior brain MRI of 01/20/2019 but otherwise age-indeterminate. Redemonstrated chronic infarct within the right corona radiata, basal ganglia, external capsule and insula. Background moderate patchy and ill-defined hypoattenuation within the cerebral white matter. There is no acute intracranial hemorrhage. No extra-axial fluid collection. No evidence of an intracranial mass. No midline shift. Vascular: No hyperdense vessel.  Atherosclerotic calcifications. Skull: No calvarial fracture or aggressive osseous lesion. Sinuses/Orbits: No mass or acute finding within the imaged orbits. Minimal secretions within the left maxillary sinus at the imaged levels. IMPRESSION: 1. Lacunar infarct within the left thalamus, new from the prior brain MRI of 01/21/2020 but otherwise age-indeterminate. A brain MRI may be obtained for further evaluation, as clinically warranted. 2. Redemonstrated chronic infarct within the right corona radiata, basal ganglia, external capsule and insula. 3. Background moderate, nonspecific cerebral white matter disease. 4. Generalized cerebral atrophy. Electronically Signed   By: Jackey Loge D.O.   On: 05/25/2023 16:04    Catarina Hartshorn, DO  [facility]  If 7PM-7AM, please contact night-coverage [email or web address] Password [email or web address] 05/27/2023, 1:43 PM   LOS: 2 days

## 2023-05-27 NOTE — Plan of Care (Signed)
Dextros gtts stopped.  Glucose staying up to this point.  Patient eating well.

## 2023-05-27 NOTE — Plan of Care (Signed)

## 2023-05-28 DIAGNOSIS — E871 Hypo-osmolality and hyponatremia: Secondary | ICD-10-CM | POA: Diagnosis not present

## 2023-05-28 DIAGNOSIS — G9341 Metabolic encephalopathy: Secondary | ICD-10-CM | POA: Diagnosis not present

## 2023-05-28 DIAGNOSIS — N179 Acute kidney failure, unspecified: Secondary | ICD-10-CM | POA: Diagnosis not present

## 2023-05-28 LAB — ACTH STIMULATION, 3 TIME POINTS
Cortisol, 30 Min: 17.5 ug/dL
Cortisol, 60 Min: 23.1 ug/dL
Cortisol, Base: 8.9 ug/dL

## 2023-05-28 LAB — BASIC METABOLIC PANEL
Anion gap: 8 (ref 5–15)
BUN: 18 mg/dL (ref 6–20)
CO2: 23 mmol/L (ref 22–32)
Calcium: 8.4 mg/dL — ABNORMAL LOW (ref 8.9–10.3)
Chloride: 99 mmol/L (ref 98–111)
Creatinine, Ser: 1.25 mg/dL — ABNORMAL HIGH (ref 0.61–1.24)
GFR, Estimated: >60 mL/min (ref >60)
Glucose, Bld: 93 mg/dL (ref 70–99)
Potassium: 3.9 mmol/L (ref 3.5–5.1)
Sodium: 130 mmol/L — ABNORMAL LOW (ref 135–145)

## 2023-05-28 LAB — GLUCOSE, CAPILLARY
Glucose-Capillary: 96 mg/dL (ref 70–99)
Glucose-Capillary: 111 mg/dL — ABNORMAL HIGH (ref 70–99)
Glucose-Capillary: 126 mg/dL — ABNORMAL HIGH (ref 70–99)

## 2023-05-28 LAB — MAGNESIUM: Magnesium: 1.8 mg/dL (ref 1.7–2.4)

## 2023-05-28 LAB — OSMOLALITY, URINE: Osmolality, Ur: 353 mOsm/kg (ref 300–900)

## 2023-05-28 MED ORDER — THIAMINE MONONITRATE 100 MG PO TABS
100.0000 mg | ORAL_TABLET | Freq: Every day | ORAL | Status: DC
  Administered 2023-05-28: 100 mg via ORAL
  Filled 2023-05-28: qty 1

## 2023-05-28 MED ORDER — HYDROXYZINE PAMOATE 25 MG PO CAPS: 25.0000 mg | ORAL_CAPSULE | Freq: Three times a day (TID) | ORAL | Status: DC | PRN

## 2023-05-28 MED ORDER — CLOPIDOGREL BISULFATE 75 MG PO TABS
75.0000 mg | ORAL_TABLET | Freq: Every day | ORAL | Status: DC
  Administered 2023-05-28: 75 mg via ORAL
  Filled 2023-05-28: qty 1

## 2023-05-28 MED ORDER — CLONIDINE HCL 0.2 MG/24HR TD PTWK
0.2000 mg | MEDICATED_PATCH | TRANSDERMAL | Status: DC
  Administered 2023-05-28: 0.2 mg via TRANSDERMAL
  Filled 2023-05-28: qty 1

## 2023-05-28 MED ORDER — LEVOTHYROXINE SODIUM 50 MCG PO TABS: 50.0000 ug | ORAL_TABLET | Freq: Every day | ORAL | 1 refills | Status: DC

## 2023-05-28 MED ORDER — CARVEDILOL 3.125 MG PO TABS
6.2500 mg | ORAL_TABLET | Freq: Two times a day (BID) | ORAL | Status: DC
  Administered 2023-05-28: 6.25 mg via ORAL
  Filled 2023-05-28: qty 2

## 2023-05-28 MED ORDER — THIAMINE HCL 100 MG/ML IJ SOLN: 100.0000 mg | Freq: Every day | INTRAMUSCULAR | Status: DC

## 2023-05-28 MED ORDER — LEVETIRACETAM 500 MG PO TABS
500.0000 mg | ORAL_TABLET | Freq: Two times a day (BID) | ORAL | Status: DC
  Administered 2023-05-28: 500 mg via ORAL
  Filled 2023-05-28: qty 1

## 2023-05-28 NOTE — Discharge Summary (Addendum)
Physician Discharge Summary   Patient: Rick Williams MRN: [id number] DOB: July 11, 1966  Admit date:     05/25/2023  Discharge date: 05/28/23  Discharge Physician: Onalee Hua Estelita Iten   PCP: Patient, No Pcp Per   Recommendations at discharge:   Please follow up with primary care provider within 1-2 weeks  Please repeat BMP and CBC in one week    Hospital Course: 57 year old male with a history of ICH (2014), alcohol abuse, hypertension, CHF, hypertension presenting from [facility] jail secondary to altered mental status.  The patient is a difficult historian.  History is obtained from review of the medical record and speaking with deputy sheriff.  The patient has been in [facility] since his discharge from [facility] on 05/22/23.  He was admitted 8/27 to 9/1 for 5 days at [facility]- ICU also for altered mental status and hypoglycemia with AKI, started on fluids and D10.  For some reason at this time, there are no details in Care Everywhere, but there is a stack of papers at bedside-which includes initial ED summary and initial HPI by admitting physician.  Blood sugar on arrival to their ED was 44, head CT was showed age-indeterminate basal ganglia and thalamic infarcts.  Per notes prior to arrival at ED, patient had been refusing food and medications- but might have been 2/2  altered mental status. Discharge summary, details of hospitalization and subsequent workup are not present or available.  The patient normally is able to converse and ambulate and is independent at baseline.  The patient returned from court on 05/25/2023 at which time, he was noted to be low bit more somnolent.  Apparently the patient ate some lunch and thereafter became increasingly somnolent and difficult to arouse.  Apparently he was drooling.  According to the [facility] who was present, the patient would arouse to painful stimuli and just mumble incomprehensibly.  There is no bowel or bladder  incontinence.  The patient did not bite his tongue.  At that point, the decision was made to transport the patient to emergency department.  According to the [facility], the patient was able to stand on his own power to transfer to the [place] to transport him although he remained quite somnolent.  The patient remained somnolent after arrival to the ED requiring protopathic stimuli to arouse him.   In the ED, the patient was hypoglycemic despite being given D50 x 2.  He was placed on D10W infusion.  He was admitted for further evaluation and treatment.  He was hypothermic but hemodynamically stable.  WBC 10.2, hemoglobin 13.7, platelets 283,000.  Sodium 139, potassium 4.6, bicarbonate 24, serum creatinine 1.67.  Upon evaluating the patient in the morning of 05/26/2023, the patient is awake and conversant.  He follows commands and seems to be near his baseline according to the [employer].  The patient denies any history of seizures, but Keppra remains on his MAR.  The patient does not recall the events of the day of admission.  He states that he has not drank any alcohol in 3 weeks and has not smoked tobacco in 2 weeks.  He denies any illicit drugs. Patient denies fevers, chills, headache, chest pain, dyspnea, nausea, vomiting, diarrhea, abdominal pain, dysuria, hematuria, hematochezia, and melena.  The patient was started on a D10W drip.  His blood sugars eventually stabilized after 24 hours.  His D10 drip was subsequently discontinued and the patient was monitored.  His diet was liberalized.  He CBGs stabilized  without any further episodes of hypoglycemia.  EEG was negative for seizure.  The patient was seen by neurology and felt that hyperglycemia may certainly have precipitated seizure.  In such a case, the patient likely had a prolonged recovery secondary to his poor neurologic reserve.  In addition, the patient later revealed that he had been eating very poorly or not eating at all.  I suspect the  patient likely has poor glycogen reserves.  Certainly, the patient may have ingested some type of toxic substance contributing to his encephalopathy and hypoglycemia.  Nevertheless, the patient's sugars improved and stabilized.  His renal function stabilized, and his serum creatinine was 1.25 on the day of discharge.  Serum sodium was 130 on the day of discharge.   Assessment and Plan: Acute Metabolic Encephalopathy -Likely secondary to hypoglycemia -There is some concern about possible seizure -EEG--mild to mod encephalopathy -MRI brain negative for acute findings -B12 765 -TSH 8.529 -Folic acid 14.2 -UA negative for pyuria -Alcohol level undetectable -Continue keppra for now -ammonia--17 -overall mental status improved, but has poor neurologic reserve and suspect may have a degree of underlying MCI   Hypoglycemia -Advance diet and wean off D10W drip -stop D10 W on 9/6 -C-peptide 3.8 -Insulin level 4.5 (normal) -Check cortisol level-10.3 -suspect some PVD in upper ext altering sugars -validate any hypoglycemic CBGs with venipunture -suspect a degree of relative adrenal insufficiency, precipitated by acute medical illness in setting of poor glycogen reserves -monitored off D10W with diet liberalized>>no further hypoglycemia   Hypothermia -blood cultures neg -TSH 8.529 -lactic peaked 1.0 -improved -UA neg for pyuria -cortisol level--10.3 -PCT <0.10   Acute kidney injury Presented with serum creatinine 1.67 -Unclear renal baseline although had serum creatinine 0.94 on 01/23/2020 -Continue IV fluids -Renal ultrasound--negative for hydronephrosis; mildly echogenic kidneys -Hold losartan/hydrochlorothiazide -suspect pt has underlying CKD -serum creatinine 1.25 on day of d/c   Essential hypertension -Holding clonidine and losartan hydrochlorothiazide -restart amlodipine -will not restart hydrochlorothiazide due to recurrent hyponatremia and risk for dehydration and  AKI -Will not restart losartan secondary to high risk for dehydration given his poor oral intake and development of AKI -Restart carvedilol, Imdur, clonidine   Chronic diastolic CHF -Appears clinically dry -Last echo 01/2020 EF of 65 to 70% with grade 1 DD.  -He is not on any diuretics   Alcohol abuse Has been incarcerated.  Blood alcohol level less than 10. -IV Thiamine 100mg  daily   Hyponatremia -multifactorial including  volume depletion and poor solute intake and hypotonic fluid from D10W and a degree of SIADH -Stop D10, start NS x 18 hours -urine osm--pending -serum osm 288 -am cortisol 10.3 -Na 130 at time of dc   Hypothyroidism -Continue Synthroid -TSH 8.529 -he was started on synthroid at [facility] less than a week ago           Consultants: neurology Procedures performed: none  Disposition: Home Diet recommendation:  Regular diet DISCHARGE MEDICATION: Allergies as of 05/28/2023       Reactions   Benadryl [diphenhydramine Hcl (sleep)] Itching, Rash        Medication List     STOP taking these medications    aspirin EC 81 MG tablet   cloNIDine 0.2 MG tablet Commonly known as: CATAPRES   losartan-hydrochlorothiazide 100-12.5 MG tablet Commonly known as: HYZAAR       TAKE these medications    Advair Diskus 250-50 MCG/ACT Aepb Generic drug: fluticasone-salmeterol Inhale 1 puff into the lungs 2 (two) times daily.   albuterol  108 (90 Base) MCG/ACT inhaler Commonly known as: VENTOLIN HFA Inhale 1 puff into the lungs every 4 (four) hours as needed for wheezing or shortness of breath.   amLODipine 10 MG tablet Commonly known as: NORVASC Take 10 mg by mouth daily.   carvedilol 6.25 MG tablet Commonly known as: COREG Take 6.25 mg by mouth 2 (two) times daily.   cloNIDine 0.2 mg/24hr patch Commonly known as: CATAPRES - Dosed in mg/24 hr Place 0.2 mg onto the skin once a week.   clopidogrel 75 MG tablet Commonly known as: PLAVIX Take 75 mg by  mouth daily.   Daily-Vite Tabs Take 1 tablet by mouth daily.   fluticasone 50 MCG/ACT nasal spray Commonly known as: FLONASE Place 1 spray into both nostrils daily.   folic acid 1 MG tablet Commonly known as: FOLVITE Take 1 tablet (1 mg total) by mouth daily.   hydrOXYzine 25 MG capsule Commonly known as: VISTARIL Take 1 capsule (25 mg total) by mouth every 8 (eight) hours as needed for anxiety. What changed:  when to take this reasons to take this   levETIRAcetam 500 MG tablet Commonly known as: KEPPRA Take 500 mg by mouth 2 (two) times daily.   levothyroxine 50 MCG tablet Commonly known as: SYNTHROID Take 1 tablet (50 mcg total) by mouth daily at 6 (six) AM. Start taking on: May 29, 2023 What changed:  medication strength how much to take when to take this   mirtazapine 15 MG disintegrating tablet Commonly known as: REMERON SOL-TAB Take 15 mg by mouth at bedtime.   nicotine 21 mg/24hr patch Commonly known as: NICODERM CQ - dosed in mg/24 hours Place 21 mg onto the skin daily.   thiamine 100 MG tablet Commonly known as: VITAMIN B1 Take 1 tablet (100 mg total) by mouth daily.        Discharge Exam: Filed Weights   05/25/23 1300 05/25/23 2035 05/28/23 0622  Weight: 70 kg 54.2 kg 55.7 kg   HEENT:  [place]/AT, No thrush, no icterus CV:  RRR, no rub, no S3, no S4 Lung:  CTA, no wheeze, no rhonchi Abd:  soft/+BS, NT Ext:  No edema, no lymphangitis, no synovitis, no rash   Condition at discharge: stable  The results of significant diagnostics from this hospitalization (including imaging, microbiology, ancillary and laboratory) are listed below for reference.   Imaging Studies: US RENAL  Result Date: 05/26/2023 CLINICAL DATA:  Acute kidney injury EXAM: RENAL / URINARY TRACT ULTRASOUND COMPLETE COMPARISON:  CT report 05/31/2004 FINDINGS: Right Kidney: Renal measurements: 11.5 x 5.3 x 6.3 cm = volume: 201.7 mL. Slightly echogenic cortex. No mass or  hydronephrosis Left Kidney: Renal measurements: 9.9 x 4.8 x 4.6 cm = volume: 113.5 mL. Slightly echogenic cortex. No mass or hydronephrosis Bladder: Appears normal for degree of bladder distention. Other: None. IMPRESSION: Slightly echogenic renal cortex bilaterally consistent with medical renal disease. No hydronephrosis. Electronically Signed   By: Jasmine Pang M.D.   On: 05/26/2023 16:50   EEG adult  Result Date: 05/26/2023 Charlsie Quest, MD     05/26/2023  3:52 PM Patient Name: Rick Williams MRN: [id number] Epilepsy Attending: Charlsie Quest Referring Physician/Provider: Catarina Hartshorn, MD Date: 05/26/2023 Duration: 22.16 mins Patient history: 58yo m with ams getting eeg to evaluate for seizure Level of alertness: Awake, asleep AEDs during EEG study: LEV Technical aspects: This EEG study was done with scalp electrodes positioned according to the 10-20 International system of electrode placement. Electrical activity was  reviewed with band pass filter of 1-70Hz , sensitivity of 7 uV/mm, display speed of 81mm/sec with a 60Hz  notched filter applied as appropriate. EEG data were recorded continuously and digitally stored.  Video monitoring was available and reviewed as appropriate. Description: The posterior dominant rhythm consists of 7 Hz activity of moderate voltage (25-35 uV) seen predominantly in posterior head regions, symmetric and reactive to eye opening and eye closing. Sleep was characterized by vertex waves, sleep spindles (12 to 14 Hz), maximal frontocentral region.  EEG showed continuous generalized 5 to 7 Hz theta slowing. Hyperventilation and photic stimulation were not performed.   ABNORMALITY - Continuous slow, generalized IMPRESSION: This study is suggestive of mild to moderate diffuse encephalopathy. No seizures or epileptiform discharges were seen throughout the recording. Charlsie Quest   MR BRAIN WO CONTRAST  Result Date: 05/25/2023 CLINICAL DATA:  Initial evaluation for acute  encephalopathy, new lacunar infarct at the left thalamus. EXAM: MRI HEAD WITHOUT CONTRAST TECHNIQUE: Multiplanar, multiecho pulse sequences of the brain and surrounding structures were obtained without intravenous contrast. COMPARISON:  Prior CT from 05/25/2023 and MRI from 01/21/2020. FINDINGS: Brain: Mildly advanced cerebral atrophy for age. Patchy T2/FLAIR hyperintensity involving the periventricular and deep white matter both cerebral hemispheres, consistent with chronic small vessel ischemic disease, moderately advanced in nature. Remote hemorrhagic infarct involving the right basal ganglia/external capsule with associated encephalomalacia. Chronic left thalamic lacunar infarct, corresponding with abnormality on prior CT. Few additional remote lacunar infarcts noted about the left basal ganglia, pons, and right middle cerebellar peduncle. No evidence for acute or subacute ischemia. Gray-white matter differentiation otherwise maintained. No acute intracranial hemorrhage. Few scattered chronic micro hemorrhages noted, hypertensive in nature. No mass lesion, midline shift or mass effect. Mild ex vacuo dilatation of the right lateral ventricle related to the chronic right cerebral infarct. No hydrocephalus. No extra-axial fluid collection. Pituitary gland suprasellar region within normal limits. Vascular: Loss of normal flow void within the intradural left V4 segment, which could reflect slow flow and/or occlusion (series 10, image 3). Major intracranial vascular flow voids are otherwise maintained. Skull and upper cervical spine: Craniocervical junction within normal limits. Bone marrow signal intensity normal. No scalp soft tissue abnormality. Sinuses/Orbits: Globes orbital soft tissues within normal limits. Paranasal sinuses are largely clear. No mastoid effusion. Other: None. IMPRESSION: 1. No acute intracranial abnormality. 2. Remote hemorrhagic infarct involving the right basal ganglia/external capsule. 3.  Underlying atrophy with moderately advanced chronic microvascular ischemic disease, with a few scattered remote lacunar infarcts as above. One of these involves the left thalamus, corresponding with abnormality on prior CT. 4. Loss of normal flow void within the intradural left V4 segment, which could reflect slow flow and/or occlusion. Electronically Signed   By: Rise Mu M.D.   On: 05/25/2023 19:51   DG CHEST PORT 1 VIEW  Result Date: 05/25/2023 CLINICAL DATA:  Acute encephalopathy.  Altered mental status. EXAM: PORTABLE CHEST 1 VIEW COMPARISON:  12/20/2018 FINDINGS: Mild cardiac enlargement. No vascular congestion, edema, or consolidation. Scarring in the lung apices. No pleural effusions. No pneumothorax. Mediastinal contours appear intact. IMPRESSION: Mild cardiac enlargement.  No evidence of active pulmonary disease. Electronically Signed   By: Burman Nieves M.D.   On: 05/25/2023 19:14   CT Head Wo Contrast  Result Date: 05/25/2023 CLINICAL DATA:  Provided history: Delirium. Additional history provided: Drooling, slow to respond, altered mental status. EXAM: CT HEAD WITHOUT CONTRAST TECHNIQUE: Contiguous axial images were obtained from the base of the skull through the vertex without  intravenous contrast. RADIATION DOSE REDUCTION: This exam was performed according to the departmental dose-optimization program which includes automated exposure control, adjustment of the mA and/or kV according to patient size and/or use of iterative reconstruction technique. COMPARISON:  Brain MRI 01/21/2020. Prior head CT examinations 01/19/2020 and earlier. FINDINGS: Brain: Generalized cerebral atrophy. Lacunar infarct within the left thalamus, new from the prior brain MRI of 01/20/2019 but otherwise age-indeterminate. Redemonstrated chronic infarct within the right corona radiata, basal ganglia, external capsule and insula. Background moderate patchy and ill-defined hypoattenuation within the cerebral  white matter. There is no acute intracranial hemorrhage. No extra-axial fluid collection. No evidence of an intracranial mass. No midline shift. Vascular: No hyperdense vessel.  Atherosclerotic calcifications. Skull: No calvarial fracture or aggressive osseous lesion. Sinuses/Orbits: No mass or acute finding within the imaged orbits. Minimal secretions within the left maxillary sinus at the imaged levels. IMPRESSION: 1. Lacunar infarct within the left thalamus, new from the prior brain MRI of 01/21/2020 but otherwise age-indeterminate. A brain MRI may be obtained for further evaluation, as clinically warranted. 2. Redemonstrated chronic infarct within the right corona radiata, basal ganglia, external capsule and insula. 3. Background moderate, nonspecific cerebral white matter disease. 4. Generalized cerebral atrophy. Electronically Signed   By: Jackey Loge D.O.   On: 05/25/2023 16:04    Microbiology: Results for orders placed or performed during the hospital encounter of 05/25/23  Culture, blood (routine x 2)     Status: None (Preliminary result)   Collection Time: 05/25/23  3:59 PM   Specimen: BLOOD LEFT ARM  Result Value Ref Range Status   Specimen Description BLOOD LEFT ARM AEROBIC BOTTLE ONLY  Final   Special Requests   Final    Blood Culture results may not be optimal due to an inadequate volume of blood received in culture bottles   Culture   Final    NO GROWTH 3 DAYS Performed at [facility], [address] [place], [place] [address]    Report Status PENDING  Incomplete  Culture, blood (routine x 2)     Status: None (Preliminary result)   Collection Time: 05/25/23  3:59 PM   Specimen: Right Antecubital; Blood  Result Value Ref Range Status   Specimen Description RIGHT ANTECUBITAL  Final   Special Requests   Final    BOTTLES DRAWN AEROBIC AND ANAEROBIC Blood Culture adequate volume   Culture   Final    NO GROWTH 3 DAYS Performed at [facility], [address], [place], [place]  [address]    Report Status PENDING  Incomplete  MRSA Next Gen by PCR, Nasal     Status: None   Collection Time: 05/25/23  8:34 PM   Specimen: Nasal Mucosa; Nasal Swab  Result Value Ref Range Status   MRSA by PCR Next Gen NOT DETECTED NOT DETECTED Final    Comment: (NOTE) The GeneXpert MRSA Assay (FDA approved for NASAL specimens only), is one component of a comprehensive MRSA colonization surveillance program. It is not intended to diagnose MRSA infection nor to guide or monitor treatment for MRSA infections. Test performance is not FDA approved in patients less than 63 years old. Performed at [facility], [address] [place], [place] [address]     Labs: CBC: Recent Labs  Lab 05/25/23 1259 05/26/23 0454 05/27/23 0431  WBC 10.2 9.1 8.3  NEUTROABS 6.6  --   --   HGB 13.7 11.5* 12.4*  HCT 41.7 33.8* 37.4*  MCV 98.6 96.3 96.1  PLT 283 233  260   Basic Metabolic Panel: Recent Labs  Lab 05/25/23 1259 05/26/23 0454 05/26/23 1117 05/27/23 0431 05/27/23 1342 05/28/23 0414  NA 129* 131*  --  128* 130* 130*  K 4.6 3.8  --  4.0 4.0 3.9  CL 95* 99  --  97* 98 99  CO2 24 22  --  21* 25 23  GLUCOSE 93 100*  --  98 90 93  BUN 32* 28*  --  22* 22* 18  CREATININE 1.67* 1.64*  --  1.36* 1.38* 1.25*  CALCIUM 9.3 8.4*  --  8.3* 8.5* 8.4*  MG  --   --  1.7 1.5*  --  1.8   Liver Function Tests: Recent Labs  Lab 05/25/23 1259 05/27/23 0431  AST 30 25  ALT 18 14  ALKPHOS 71 59  BILITOT 0.7 0.6  PROT 9.3* 7.5  ALBUMIN 3.4* 2.6*   CBG: Recent Labs  Lab 05/27/23 2036 05/27/23 2338 05/28/23 0540 05/28/23 0726 05/28/23 1008  GLUCAP 111* 100* 96 111* 126*    Discharge time spent: greater than 30 minutes.  Signed: Catarina Hartshorn, MD [facility] 05/28/2023

## 2023-05-30 LAB — CULTURE, BLOOD (ROUTINE X 2)
Culture: NO GROWTH
Culture: NO GROWTH
Special Requests: ADEQUATE

## 2023-05-31 LAB — ACTH: C206 ACTH: 25.8 pg/mL (ref 7.2–63.3)

## 2023-06-05 LAB — SULFONYLUREA HYPOGLYCEMICS PANEL, SERUM
Acetohexamide: NEGATIVE ug/mL (ref 20–60)
Chlorpropamide: NEGATIVE ug/mL (ref 75–250)
Glimepiride: NEGATIVE ng/mL (ref 80–250)
Glipizide: NEGATIVE ng/mL (ref 200–1000)
Glyburide: NEGATIVE ng/mL
Nateglinide: NEGATIVE ng/mL
Repaglinide: NEGATIVE ng/mL
Tolazamide: NEGATIVE ug/mL
Tolbutamide: NEGATIVE ug/mL (ref 40–100)

## 2023-06-16 ENCOUNTER — Emergency Department (HOSPITAL_COMMUNITY): Admission: EM | Admit: 2023-06-16 | Discharge: 2023-06-17 | Source: Home / Self Care

## 2023-06-16 ENCOUNTER — Encounter (HOSPITAL_COMMUNITY): Payer: Self-pay | Admitting: Emergency Medicine

## 2023-06-16 ENCOUNTER — Emergency Department (HOSPITAL_COMMUNITY): Admission: EM | Admit: 2023-06-16 | Discharge: 2023-06-16 | Disposition: A | Discharge status: Home / Self Care

## 2023-06-16 ENCOUNTER — Other Ambulatory Visit: Payer: Self-pay

## 2023-06-16 ENCOUNTER — Encounter (HOSPITAL_COMMUNITY): Payer: Self-pay

## 2023-06-16 DIAGNOSIS — R040 Epistaxis: Secondary | ICD-10-CM | POA: Insufficient documentation

## 2023-06-16 DIAGNOSIS — F17210 Nicotine dependence, cigarettes, uncomplicated: Secondary | ICD-10-CM | POA: Insufficient documentation

## 2023-06-16 DIAGNOSIS — I509 Heart failure, unspecified: Secondary | ICD-10-CM | POA: Insufficient documentation

## 2023-06-16 DIAGNOSIS — I2510 Atherosclerotic heart disease of native coronary artery without angina pectoris: Secondary | ICD-10-CM | POA: Insufficient documentation

## 2023-06-16 LAB — COMPREHENSIVE METABOLIC PANEL
ALT: 17 U/L (ref 0–44)
AST: 22 U/L (ref 15–41)
Albumin: 3.3 g/dL — ABNORMAL LOW (ref 3.5–5.0)
Alkaline Phosphatase: 69 U/L (ref 38–126)
Anion gap: 8 (ref 5–15)
BUN: 33 mg/dL — ABNORMAL HIGH (ref 6–20)
CO2: 24 mmol/L (ref 22–32)
Calcium: 8.8 mg/dL — ABNORMAL LOW (ref 8.9–10.3)
Chloride: 103 mmol/L (ref 98–111)
Creatinine, Ser: 1.42 mg/dL — ABNORMAL HIGH (ref 0.61–1.24)
GFR, Estimated: 58 mL/min — ABNORMAL LOW (ref >60)
Glucose, Bld: 112 mg/dL — ABNORMAL HIGH (ref 70–99)
Potassium: 4.5 mmol/L (ref 3.5–5.1)
Sodium: 135 mmol/L (ref 135–145)
Total Bilirubin: 0.5 mg/dL (ref 0.3–1.2)
Total Protein: 8.3 g/dL — ABNORMAL HIGH (ref 6.5–8.1)

## 2023-06-16 LAB — PROTIME-INR
INR: 1.2 (ref 0.8–1.2)
Prothrombin Time: 15.1 seconds (ref 11.4–15.2)

## 2023-06-16 LAB — CBC WITH DIFFERENTIAL/PLATELET
Abs Immature Granulocytes: 0.02 10*3/uL (ref 0.00–0.07)
Basophils Absolute: 0.1 10*3/uL (ref 0.0–0.1)
Basophils Relative: 1 %
Eosinophils Absolute: 0.2 10*3/uL (ref 0.0–0.5)
Eosinophils Relative: 3 %
HCT: 36.3 % — ABNORMAL LOW (ref 39.0–52.0)
Hemoglobin: 11.6 g/dL — ABNORMAL LOW (ref 13.0–17.0)
Immature Granulocytes: 0 %
Lymphocytes Relative: 32 %
Lymphs Abs: 2.2 10*3/uL (ref 0.7–4.0)
MCH: 31.5 pg (ref 26.0–34.0)
MCHC: 32 g/dL (ref 30.0–36.0)
MCV: 98.6 fL (ref 80.0–100.0)
Monocytes Absolute: 0.6 10*3/uL (ref 0.1–1.0)
Monocytes Relative: 8 %
Neutro Abs: 3.8 10*3/uL (ref 1.7–7.7)
Neutrophils Relative %: 56 %
Platelets: 274 10*3/uL (ref 150–400)
RBC: 3.68 MIL/uL — ABNORMAL LOW (ref 4.22–5.81)
RDW: 13.3 % (ref 11.5–15.5)
WBC: 7 10*3/uL (ref 4.0–10.5)
nRBC: 0 % (ref 0.0–0.2)

## 2023-06-16 LAB — APTT: aPTT: 32 seconds (ref 24–36)

## 2023-06-16 MED ORDER — TRANEXAMIC ACID FOR EPISTAXIS
500.0000 mg | Freq: Once | TOPICAL | Status: AC
  Administered 2023-06-16: 500 mg via TOPICAL
  Filled 2023-06-16: qty 10

## 2023-06-16 MED ORDER — LIDOCAINE-EPINEPHRINE (PF) 2 %-1:200000 IJ SOLN
10.0000 mL | Freq: Once | INTRAMUSCULAR | Status: AC
  Administered 2023-06-16: 10 mL
  Filled 2023-06-16: qty 20

## 2023-06-16 MED ORDER — SILVER NITRATE-POT NITRATE 75-25 % EX MISC
1.0000 | Freq: Once | CUTANEOUS | Status: AC
  Administered 2023-06-16: 1 via TOPICAL
  Filled 2023-06-16: qty 10

## 2023-06-16 MED ORDER — OXYMETAZOLINE HCL 0.05 % NA SOLN
1.0000 | Freq: Once | NASAL | Status: AC
  Administered 2023-06-16: 1 via NASAL
  Filled 2023-06-16: qty 30

## 2023-06-16 NOTE — ED Provider Notes (Signed)
[place] EMERGENCY DEPARTMENT AT [facility] Provider Note   CSN: [id number] Arrival date & time: 06/16/23  1415     History  Chief Complaint  Patient presents with   Epistaxis    Rick Williams is a 57 y.o. male.   Epistaxis   57 year old male presents emergency department with complaints of nosebleed.  Patient states that his left nose began to bleed around noon today.  Denies any trauma to the nose or picking nose or blood thinner use.  States it began to bleed spontaneously.  States he has been trying to hold pressure but was unsuccessful in getting bleeding to stop prompting visit emergency department.  Patient coming from [facility] jail.  Past medical history significant for CHF, alcohol abuse, CVA,  Home Medications Prior to Admission medications   Medication Sig Start Date End Date Taking? Authorizing Provider  ADVAIR DISKUS 250-50 MCG/ACT AEPB Inhale 1 puff into the lungs 2 (two) times daily. 05/17/23   [provider]  albuterol (VENTOLIN HFA) 108 (90 Base) MCG/ACT inhaler Inhale 1 puff into the lungs every 4 (four) hours as needed for wheezing or shortness of breath. 05/24/23   [provider]  amLODipine (NORVASC) 10 MG tablet Take 10 mg by mouth daily. 05/24/23   [provider]  carvedilol (COREG) 6.25 MG tablet Take 6.25 mg by mouth 2 (two) times daily. 05/24/23   [provider]  cloNIDine (CATAPRES - DOSED IN MG/24 HR) 0.2 mg/24hr patch Place 0.2 mg onto the skin once a week. 05/17/23   [provider]  clopidogrel (PLAVIX) 75 MG tablet Take 75 mg by mouth daily. 05/24/23   [provider]  fluticasone (FLONASE) 50 MCG/ACT nasal spray Place 1 spray into both nostrils daily. 04/19/23   [provider]  folic acid (FOLVITE) 1 MG tablet Take 1 tablet (1 mg total) by mouth daily. 01/23/20   Noralee Stain, DO  hydrOXYzine (VISTARIL) 25 MG capsule Take 1 capsule (25 mg total) by mouth every 8 (eight) hours as  needed for anxiety. 05/28/23   Catarina Hartshorn, MD  levETIRAcetam (KEPPRA) 500 MG tablet Take 500 mg by mouth 2 (two) times daily. 05/24/23   [provider]  levothyroxine (SYNTHROID) 50 MCG tablet Take 1 tablet (50 mcg total) by mouth daily at 6 (six) AM. 05/29/23   Tat, Onalee Hua, MD  mirtazapine (REMERON SOL-TAB) 15 MG disintegrating tablet Take 15 mg by mouth at bedtime. 05/18/23   [provider]  Multiple Vitamin (DAILY-VITE) TABS Take 1 tablet by mouth daily. 05/24/23   [provider]  nicotine (NICODERM CQ - DOSED IN MG/24 HOURS) 21 mg/24hr patch Place 21 mg onto the skin daily. 05/17/23   [provider]  thiamine 100 MG tablet Take 1 tablet (100 mg total) by mouth daily. 01/23/20   Noralee Stain, DO      Allergies    Benadryl [diphenhydramine hcl (sleep)]    Review of Systems   Review of Systems  HENT:  Positive for nosebleeds.   All other systems reviewed and are negative.   Physical Exam Updated Vital Signs BP (!) 129/94 (BP Location: Right Arm)   Pulse 63   Temp 97.8 F (36.6 C) (Oral)   Resp 16   Ht 5' 9.5" (1.765 m)   Wt 70.3 kg   SpO2 94%   BMI 22.56 kg/m  Physical Exam Vitals and nursing note reviewed.  Constitutional:      General: He is not in acute distress.  Appearance: He is well-developed.  HENT:     Head: Normocephalic and atraumatic.     Nose:     Comments: Active bleeding from left nare.  Unable to determine source. Eyes:     Conjunctiva/sclera: Conjunctivae normal.  Cardiovascular:     Rate and Rhythm: Normal rate and regular rhythm.     Heart sounds: No murmur heard. Pulmonary:     Effort: Pulmonary effort is normal. No respiratory distress.     Breath sounds: Normal breath sounds.  Abdominal:     Palpations: Abdomen is soft.     Tenderness: There is no abdominal tenderness.  Musculoskeletal:        General: No swelling.     Cervical back: Neck supple.  Skin:    General: Skin is warm and dry.     Capillary Refill:  Capillary refill takes less than 2 seconds.  Neurological:     Mental Status: He is alert.  Psychiatric:        Mood and Affect: Mood normal.     ED Results / Procedures / Treatments   Labs (all labs ordered are listed, but only abnormal results are displayed) Labs Reviewed - No data to display  EKG None  Radiology No results found.  Procedures .Epistaxis Management  Date/Time: 06/16/2023 5:07 PM  Performed by: Peter Garter, PA Authorized by: Peter Garter, PA   Consent:    Consent obtained:  Verbal   Consent given by:  Patient   Risks, benefits, and alternatives were discussed: yes     Risks discussed:  Bleeding, infection, nasal injury and pain   Alternatives discussed:  Delayed treatment, no treatment, alternative treatment, observation and referral Universal protocol:    Procedure explained and questions answered to patient or proxy's satisfaction: yes     Patient identity confirmed:  Verbally with patient and arm band Anesthesia:    Anesthesia method:  Topical application   Topical anesthetic:  Epinephrine Procedure details:    Treatment site:  L anterior   Treatment method:  Anterior pack and silver nitrate   Treatment complexity:  Limited   Treatment episode: recurring   Post-procedure details:    Assessment:  Bleeding stopped   Procedure completion:  Tolerated well, no immediate complications     Medications Ordered in ED Medications  silver nitrate applicators applicator 1 Stick (has no administration in time range)  oxymetazoline (AFRIN) 0.05 % nasal spray 1 spray (1 spray Each Nare Given 06/16/23 1603)  lidocaine-EPINEPHrine (XYLOCAINE W/EPI) 2 %-1:200000 (PF) injection 10 mL (10 mLs Infiltration Given by Other 06/16/23 1642)    ED Course/ Medical Decision Making/ A&P Clinical Course as of 06/16/23 1709  Thu Jun 16, 2023  1641 Patient was packed with cotton ball with lidocaine/epinephrine.  Will observe for 20 to 30 minutes and reassess.  [CR]    Clinical Course User Index [CR] Peter Garter, PA                                 Medical Decision Making Risk OTC drugs. Prescription drug management.   This patient presents to the ED for concern of epistaxis, this involves an extensive number of treatment options, and is a complaint that carries with it a high risk of complications and morbidity.  The differential diagnosis includes anterior/posterior nosebleed   Co morbidities that complicate the patient evaluation  See HPI   Additional history obtained:  Additional  history obtained from EMR External records from outside source obtained and reviewed including hospital records   Lab Tests:  N/a   Imaging Studies ordered:  N/a   Cardiac Monitoring: / EKG:  The patient was maintained on a cardiac monitor.  I personally viewed and interpreted the cardiac monitored which showed an underlying rhythm of: Sinus rhythm   Consultations Obtained:  N/a   Problem List / ED Course / Critical interventions / Medication management  Epistaxis I ordered medication including Afrin, lidocaine with epinephrine  Reevaluation of the patient after these medicines showed that the patient improved I have reviewed the patients home medicines and have made adjustments as needed   Social Determinants of Health:  Chronic cigarette use.  Chronic alcohol use.   Test / Admission - Considered:  Epistaxis Vitals signs within normal range and stable throughout visit. 57 year old male presents emergency department with complaints of nosebleed.  Nosebleed unprovoked in nature from a traumatic perspective.  On exam, patient with evidence of left anterior nosebleed.  Attempted Afrin with compression as well as anterior packing with lidocaine with epinephrine with subsequent resolution of nosebleed.  Absorbable vessel was cauterized using silver nitrate.  Patient observed for 30 to 40 minutes without any additional bleed.  Will  recommend Afrin as needed for nosebleed in the future as well as follow-up with ENT in the outpatient setting.  Treatment plan discussed with patient and he acknowledged understand was agreeable to said plan.  Patient overall well-appearing, afebrile in no acute distress. Worrisome signs and symptoms were discussed with the patient, and the patient acknowledged understanding to return to the ED if noticed. Patient was stable upon discharge.          Final Clinical Impression(s) / ED Diagnoses Final diagnoses:  Left-sided epistaxis    Rx / DC Orders ED Discharge Orders     None         Peter Garter, [place] 06/16/23 1709    Durwin Glaze, MD 06/16/23 [phone]

## 2023-06-16 NOTE — ED Provider Notes (Signed)
[place] EMERGENCY DEPARTMENT AT [facility] Provider Note   CSN: [id number] Arrival date & time: 06/16/23  2047     History  Chief Complaint  Patient presents with   Epistaxis    Rick Williams is a 57 y.o. male.  He has PMH of hemorrhagic stroke, alcohol abuse, from jail for left-sided nosebleed today.  Was seen earlier for spontaneous nosebleed that stopped with cauterization.    Epistaxis      Home Medications Prior to Admission medications   Medication Sig Start Date End Date Taking? Authorizing Provider  ADVAIR DISKUS 250-50 MCG/ACT AEPB Inhale 1 puff into the lungs 2 (two) times daily. 05/17/23   [provider]  albuterol (VENTOLIN HFA) 108 (90 Base) MCG/ACT inhaler Inhale 1 puff into the lungs every 4 (four) hours as needed for wheezing or shortness of breath. 05/24/23   [provider]  amLODipine (NORVASC) 10 MG tablet Take 10 mg by mouth daily. 05/24/23   [provider]  carvedilol (COREG) 6.25 MG tablet Take 6.25 mg by mouth 2 (two) times daily. 05/24/23   [provider]  cloNIDine (CATAPRES - DOSED IN MG/24 HR) 0.2 mg/24hr patch Place 0.2 mg onto the skin once a week. 05/17/23   [provider]  clopidogrel (PLAVIX) 75 MG tablet Take 75 mg by mouth daily. 05/24/23   [provider]  fluticasone (FLONASE) 50 MCG/ACT nasal spray Place 1 spray into both nostrils daily. 04/19/23   [provider]  folic acid (FOLVITE) 1 MG tablet Take 1 tablet (1 mg total) by mouth daily. 01/23/20   Noralee Stain, DO  hydrOXYzine (VISTARIL) 25 MG capsule Take 1 capsule (25 mg total) by mouth every 8 (eight) hours as needed for anxiety. 05/28/23   Catarina Hartshorn, MD  levETIRAcetam (KEPPRA) 500 MG tablet Take 500 mg by mouth 2 (two) times daily. 05/24/23   [provider]  levothyroxine (SYNTHROID) 50 MCG tablet Take 1 tablet (50 mcg total) by mouth daily at 6 (six) AM. 05/29/23   Tat, Onalee Hua, MD  mirtazapine (REMERON  SOL-TAB) 15 MG disintegrating tablet Take 15 mg by mouth at bedtime. 05/18/23   [provider]  Multiple Vitamin (DAILY-VITE) TABS Take 1 tablet by mouth daily. 05/24/23   [provider]  nicotine (NICODERM CQ - DOSED IN MG/24 HOURS) 21 mg/24hr patch Place 21 mg onto the skin daily. 05/17/23   [provider]  thiamine 100 MG tablet Take 1 tablet (100 mg total) by mouth daily. 01/23/20   Noralee Stain, DO      Allergies    Benadryl [diphenhydramine hcl (sleep)]    Review of Systems   Review of Systems  HENT:  Positive for nosebleeds.     Physical Exam Updated Vital Signs BP (!) 137/109   Pulse 67   Temp 98.1 F (36.7 C)   Resp 18   Ht 5\' 9"  (1.753 m)   Wt 70.3 kg   SpO2 91%   BMI 22.89 kg/m  Physical Exam Vitals and nursing note reviewed.  Constitutional:      General: He is not in acute distress.    Appearance: He is well-developed.  HENT:     Head: Normocephalic and atraumatic.     Mouth/Throat:     Mouth: Mucous membranes are moist.  Eyes:     Conjunctiva/sclera: Conjunctivae normal.  Cardiovascular:     Rate and Rhythm: Normal rate and regular rhythm.     Heart sounds: No murmur heard.  Pulmonary:     Effort: Pulmonary effort is normal. No respiratory distress.     Breath sounds: Normal breath sounds.  Abdominal:     Palpations: Abdomen is soft.     Tenderness: There is no abdominal tenderness.  Musculoskeletal:        General: No swelling.     Cervical back: Neck supple.  Skin:    General: Skin is warm and dry.     Capillary Refill: Capillary refill takes less than 2 seconds.  Neurological:     General: No focal deficit present.     Mental Status: He is alert and oriented to person, place, and time.  Psychiatric:        Mood and Affect: Mood normal.     ED Results / Procedures / Treatments   Labs (all labs ordered are listed, but only abnormal results are displayed) Labs Reviewed  CBC WITH DIFFERENTIAL/PLATELET - Abnormal;  Notable for the following components:      Result Value   RBC 3.68 (*)    Hemoglobin 11.6 (*)    HCT 36.3 (*)    All other components within normal limits  COMPREHENSIVE METABOLIC PANEL - Abnormal; Notable for the following components:   Glucose, Bld 112 (*)    BUN 33 (*)    Creatinine, Ser 1.42 (*)    Calcium 8.8 (*)    Total Protein 8.3 (*)    Albumin 3.3 (*)    GFR, Estimated 58 (*)    All other components within normal limits  PROTIME-INR  APTT    EKG None  Radiology No results found.  Procedures .Epistaxis Management  Date/Time: 06/16/2023 11:06 PM  Performed by: Ma Rings, PA-C Authorized by: Ma Rings, PA-C   Consent:    Consent obtained:  Verbal   Consent given by:  Patient   Risks discussed:  Bleeding, infection, nasal injury and pain Universal protocol:    Patient identity confirmed:  Verbally with patient Anesthesia:    Anesthesia method:  None Procedure details:    Treatment site:  L anterior   Repair method: 5.5 cm Rhino Rocket.   Treatment complexity:  Limited   Treatment episode: initial   Post-procedure details:    Assessment:  Bleeding stopped   Procedure completion:  Tolerated well, no immediate complications     Medications Ordered in ED Medications  tranexamic acid (CYKLOKAPRON) 1000 MG/10ML topical solution 500 mg (500 mg Topical Given by Other 06/16/23 2319)    ED Course/ Medical Decision Making/ A&P                                 Medical Decision Making DDx: Spontaneous epistaxis, traumatic epistaxis, coagulopathy, other  ED course: Patient presents for recurrent nosebleed, was here earlier and got Afrin and cautery, discharged back to the jail and states when he was getting a to eat dinner his nose started bleeding again.  Denies blowing his nose or scratching or picking his nose prior to this happening.  Denies any other complaints, does not feel dizzy but states he had a large amount of bleeding in his "spit out"  with some clots.  He is not on blood thinners.  Denies trauma.  On evaluation patient was having brisk oozing from left naris, he had been applying pressure to the nasal bridge so he was instructed to apply pressure appropriately, he had already gotten Afrin with EMS without improvement.  Given his recurrent bleeding discussion of benefits with patient of packing and he is agreeable.  Rhino Rocket placed without difficulty with topical TXA with resolution of bleeding.  Patient tolerated this well.  No evidence of posterior bleeding, no bleeding of the right naris.  For some basic labs to rule out anemia or coagulopathy, hemoglobin is around his baseline, vitals are stable, no coagulopathy, platelets are normal, mild increase in his BUN and creatinine from most p recent has been higher in the past however.  Advised to drink fluids and follow-up with PCP, advised on ENT follow-up for likely removal.  Bleeding has stopped on reevaluation.  Amount and/or Complexity of Data Reviewed Labs: ordered.           Final Clinical Impression(s) / ED Diagnoses Final diagnoses:  Epistaxis    Rx / DC Orders ED Discharge Orders     None         Ma Rings, PA-C 06/17/23 0001    Durwin Glaze, MD 06/17/23 1642

## 2023-06-16 NOTE — ED Triage Notes (Signed)
[facility]. Pt was seen here earlier for the same and discharged back to jail. Pt holding pressure to nose at this time. EMS gave afrin spray in route. EMS reports pt is hypertensive and has been spitting up large clots.

## 2023-06-16 NOTE — ED Triage Notes (Signed)
Pt BIB [facility] from jail with c/o nose bleed since 1200, denies thinnners

## 2023-06-16 NOTE — Discharge Instructions (Signed)
As discussed, nosebleed was treated while in the emergency department.  You may use Afrin nasal spray with compression if nosebleed occurs again.  If you are unable to get nosebleed under control, you are always welcome  back to the emergency department.

## 2023-06-17 NOTE — Discharge Instructions (Signed)
You are seen today for a nosebleed.  We had to use a packing device to stop the bleeding.  This needs to be left in place and removed in 3 days by PCP or ENT.  Come back if you have recurrent bleeding, your blood work showed a mild increase in your kidney function, drink plenty of fluids and follow-up with primary care.

## 2023-10-26 ENCOUNTER — Inpatient Hospital Stay (HOSPITAL_COMMUNITY)
Admission: EM | Admit: 2023-10-26 | Discharge: 2023-11-02 | DRG: 286 | Attending: Family Medicine | Admitting: Family Medicine

## 2023-10-26 ENCOUNTER — Encounter (HOSPITAL_COMMUNITY): Payer: Self-pay

## 2023-10-26 ENCOUNTER — Emergency Department (HOSPITAL_COMMUNITY): Payer: MEDICAID

## 2023-10-26 ENCOUNTER — Other Ambulatory Visit: Payer: Self-pay

## 2023-10-26 DIAGNOSIS — Z7951 Long term (current) use of inhaled steroids: Secondary | ICD-10-CM

## 2023-10-26 DIAGNOSIS — F1010 Alcohol abuse, uncomplicated: Secondary | ICD-10-CM | POA: Diagnosis present

## 2023-10-26 DIAGNOSIS — I2721 Secondary pulmonary arterial hypertension: Secondary | ICD-10-CM | POA: Diagnosis present

## 2023-10-26 DIAGNOSIS — J441 Chronic obstructive pulmonary disease with (acute) exacerbation: Secondary | ICD-10-CM | POA: Diagnosis present

## 2023-10-26 DIAGNOSIS — E861 Hypovolemia: Secondary | ICD-10-CM | POA: Diagnosis not present

## 2023-10-26 DIAGNOSIS — Z79899 Other long term (current) drug therapy: Secondary | ICD-10-CM

## 2023-10-26 DIAGNOSIS — I509 Heart failure, unspecified: Secondary | ICD-10-CM | POA: Diagnosis present

## 2023-10-26 DIAGNOSIS — E43 Unspecified severe protein-calorie malnutrition: Secondary | ICD-10-CM | POA: Diagnosis not present

## 2023-10-26 DIAGNOSIS — I2723 Pulmonary hypertension due to lung diseases and hypoxia: Secondary | ICD-10-CM | POA: Diagnosis present

## 2023-10-26 DIAGNOSIS — F17210 Nicotine dependence, cigarettes, uncomplicated: Secondary | ICD-10-CM | POA: Diagnosis present

## 2023-10-26 DIAGNOSIS — I272 Pulmonary hypertension, unspecified: Secondary | ICD-10-CM | POA: Diagnosis not present

## 2023-10-26 DIAGNOSIS — I5082 Biventricular heart failure: Secondary | ICD-10-CM | POA: Diagnosis present

## 2023-10-26 DIAGNOSIS — E785 Hyperlipidemia, unspecified: Secondary | ICD-10-CM | POA: Diagnosis present

## 2023-10-26 DIAGNOSIS — Z7902 Long term (current) use of antithrombotics/antiplatelets: Secondary | ICD-10-CM

## 2023-10-26 DIAGNOSIS — N189 Chronic kidney disease, unspecified: Secondary | ICD-10-CM | POA: Diagnosis not present

## 2023-10-26 DIAGNOSIS — N179 Acute kidney failure, unspecified: Secondary | ICD-10-CM

## 2023-10-26 DIAGNOSIS — E871 Hypo-osmolality and hyponatremia: Secondary | ICD-10-CM | POA: Diagnosis present

## 2023-10-26 DIAGNOSIS — Z1152 Encounter for screening for COVID-19: Secondary | ICD-10-CM

## 2023-10-26 DIAGNOSIS — E8721 Acute metabolic acidosis: Secondary | ICD-10-CM | POA: Diagnosis present

## 2023-10-26 DIAGNOSIS — R0902 Hypoxemia: Secondary | ICD-10-CM | POA: Diagnosis not present

## 2023-10-26 DIAGNOSIS — Z681 Body mass index (BMI) 19 or less, adult: Secondary | ICD-10-CM | POA: Diagnosis not present

## 2023-10-26 DIAGNOSIS — I5043 Acute on chronic combined systolic (congestive) and diastolic (congestive) heart failure: Secondary | ICD-10-CM | POA: Diagnosis present

## 2023-10-26 DIAGNOSIS — E1122 Type 2 diabetes mellitus with diabetic chronic kidney disease: Secondary | ICD-10-CM | POA: Diagnosis present

## 2023-10-26 DIAGNOSIS — E039 Hypothyroidism, unspecified: Secondary | ICD-10-CM | POA: Diagnosis present

## 2023-10-26 DIAGNOSIS — I50813 Acute on chronic right heart failure: Secondary | ICD-10-CM | POA: Diagnosis present

## 2023-10-26 DIAGNOSIS — I130 Hypertensive heart and chronic kidney disease with heart failure and stage 1 through stage 4 chronic kidney disease, or unspecified chronic kidney disease: Principal | ICD-10-CM | POA: Diagnosis present

## 2023-10-26 DIAGNOSIS — Z8673 Personal history of transient ischemic attack (TIA), and cerebral infarction without residual deficits: Secondary | ICD-10-CM

## 2023-10-26 DIAGNOSIS — R040 Epistaxis: Secondary | ICD-10-CM | POA: Diagnosis not present

## 2023-10-26 DIAGNOSIS — I5031 Acute diastolic (congestive) heart failure: Secondary | ICD-10-CM | POA: Diagnosis not present

## 2023-10-26 DIAGNOSIS — N182 Chronic kidney disease, stage 2 (mild): Secondary | ICD-10-CM | POA: Diagnosis present

## 2023-10-26 DIAGNOSIS — R54 Age-related physical debility: Secondary | ICD-10-CM | POA: Diagnosis present

## 2023-10-26 DIAGNOSIS — I493 Ventricular premature depolarization: Secondary | ICD-10-CM | POA: Diagnosis present

## 2023-10-26 DIAGNOSIS — J440 Chronic obstructive pulmonary disease with acute lower respiratory infection: Secondary | ICD-10-CM | POA: Diagnosis present

## 2023-10-26 DIAGNOSIS — Z6822 Body mass index (BMI) 22.0-22.9, adult: Secondary | ICD-10-CM

## 2023-10-26 DIAGNOSIS — R680 Hypothermia, not associated with low environmental temperature: Secondary | ICD-10-CM | POA: Diagnosis present

## 2023-10-26 DIAGNOSIS — Z888 Allergy status to other drugs, medicaments and biological substances status: Secondary | ICD-10-CM

## 2023-10-26 DIAGNOSIS — D631 Anemia in chronic kidney disease: Secondary | ICD-10-CM | POA: Diagnosis present

## 2023-10-26 DIAGNOSIS — N183 Chronic kidney disease, stage 3 unspecified: Secondary | ICD-10-CM | POA: Diagnosis not present

## 2023-10-26 DIAGNOSIS — I3139 Other pericardial effusion (noninflammatory): Secondary | ICD-10-CM | POA: Diagnosis present

## 2023-10-26 DIAGNOSIS — Z8249 Family history of ischemic heart disease and other diseases of the circulatory system: Secondary | ICD-10-CM

## 2023-10-26 DIAGNOSIS — R4182 Altered mental status, unspecified: Secondary | ICD-10-CM | POA: Diagnosis not present

## 2023-10-26 DIAGNOSIS — I10 Essential (primary) hypertension: Secondary | ICD-10-CM | POA: Diagnosis not present

## 2023-10-26 DIAGNOSIS — I071 Rheumatic tricuspid insufficiency: Secondary | ICD-10-CM | POA: Diagnosis present

## 2023-10-26 DIAGNOSIS — R9431 Abnormal electrocardiogram [ECG] [EKG]: Secondary | ICD-10-CM | POA: Diagnosis present

## 2023-10-26 DIAGNOSIS — Z79890 Hormone replacement therapy (postmenopausal): Secondary | ICD-10-CM

## 2023-10-26 DIAGNOSIS — R591 Generalized enlarged lymph nodes: Secondary | ICD-10-CM | POA: Diagnosis present

## 2023-10-26 HISTORY — DX: Chronic obstructive pulmonary disease, unspecified: J44.9

## 2023-10-26 HISTORY — DX: Disorder of thyroid, unspecified: E07.9

## 2023-10-26 LAB — BLOOD CULTURE ID PANEL (REFLEXED) - BCID2

## 2023-10-26 LAB — BLOOD GAS, ARTERIAL
Acid-base deficit: 2.1 mmol/L — ABNORMAL HIGH (ref 0.0–2.0)
Bicarbonate: 21.1 mmol/L (ref 20.0–28.0)
Drawn by: 27016
O2 Saturation: 97.9 %
Patient temperature: 36.6
pCO2 arterial: 30 mm[Hg] — ABNORMAL LOW (ref 32–48)
pH, Arterial: 7.45 (ref 7.35–7.45)
pO2, Arterial: 80 mm[Hg] — ABNORMAL LOW (ref 83–108)

## 2023-10-26 LAB — CBC WITH DIFFERENTIAL/PLATELET
Abs Immature Granulocytes: 0.04 10*3/uL (ref 0.00–0.07)
Basophils Absolute: 0.1 10*3/uL (ref 0.0–0.1)
Basophils Relative: 1 %
Eosinophils Absolute: 0.2 10*3/uL (ref 0.0–0.5)
Eosinophils Relative: 2 %
HCT: 37.1 % — ABNORMAL LOW (ref 39.0–52.0)
Hemoglobin: 11.8 g/dL — ABNORMAL LOW (ref 13.0–17.0)
Immature Granulocytes: 0 %
Lymphocytes Relative: 13 %
Lymphs Abs: 1.3 10*3/uL (ref 0.7–4.0)
MCH: 27.1 pg (ref 26.0–34.0)
MCHC: 31.8 g/dL (ref 30.0–36.0)
MCV: 85.3 fL (ref 80.0–100.0)
Monocytes Absolute: 0.8 10*3/uL (ref 0.1–1.0)
Monocytes Relative: 9 %
Neutro Abs: 7.4 10*3/uL (ref 1.7–7.7)
Neutrophils Relative %: 75 %
Platelets: 281 10*3/uL (ref 150–400)
RBC: 4.35 MIL/uL (ref 4.22–5.81)
RDW: 15.9 % — ABNORMAL HIGH (ref 11.5–15.5)
WBC: 9.8 10*3/uL (ref 4.0–10.5)
nRBC: 1.2 % — ABNORMAL HIGH (ref 0.0–0.2)

## 2023-10-26 LAB — PROCALCITONIN: Procalcitonin: <0.1 ng/mL

## 2023-10-26 LAB — URINALYSIS, ROUTINE W REFLEX MICROSCOPIC
Bilirubin Urine: NEGATIVE
Glucose, UA: NEGATIVE mg/dL
Hgb urine dipstick: NEGATIVE
Ketones, ur: NEGATIVE mg/dL
Leukocytes,Ua: NEGATIVE
Nitrite: NEGATIVE
Protein, ur: NEGATIVE mg/dL
Specific Gravity, Urine: 1.012 (ref 1.005–1.030)
pH: 5 (ref 5.0–8.0)

## 2023-10-26 LAB — TROPONIN I (HIGH SENSITIVITY)
Troponin I (High Sensitivity): 25 ng/L — ABNORMAL HIGH (ref <18)
Troponin I (High Sensitivity): 16 ng/L (ref <18)

## 2023-10-26 LAB — BASIC METABOLIC PANEL
Anion gap: 11 (ref 5–15)
BUN: 42 mg/dL — ABNORMAL HIGH (ref 6–20)
CO2: 20 mmol/L — ABNORMAL LOW (ref 22–32)
Calcium: 9.1 mg/dL (ref 8.9–10.3)
Chloride: 106 mmol/L (ref 98–111)
Creatinine, Ser: 1.71 mg/dL — ABNORMAL HIGH (ref 0.61–1.24)
GFR, Estimated: 46 mL/min — ABNORMAL LOW (ref >60)
Glucose, Bld: 121 mg/dL — ABNORMAL HIGH (ref 70–99)
Potassium: 4.4 mmol/L (ref 3.5–5.1)
Sodium: 137 mmol/L (ref 135–145)

## 2023-10-26 LAB — RESP PANEL BY RT-PCR (RSV, FLU A&B, COVID)  RVPGX2
Influenza A by PCR: NEGATIVE
Influenza B by PCR: NEGATIVE
Resp Syncytial Virus by PCR: NEGATIVE
SARS Coronavirus 2 by RT PCR: NEGATIVE

## 2023-10-26 LAB — LACTIC ACID, PLASMA
Lactic Acid, Venous: 2 mmol/L (ref 0.5–1.9)
Lactic Acid, Venous: 1.3 mmol/L (ref 0.5–1.9)

## 2023-10-26 LAB — BRAIN NATRIURETIC PEPTIDE: B Natriuretic Peptide: 2518 pg/mL — ABNORMAL HIGH (ref 0.0–100.0)

## 2023-10-26 LAB — MAGNESIUM: Magnesium: 2 mg/dL (ref 1.7–2.4)

## 2023-10-26 MED ORDER — FUROSEMIDE 10 MG/ML IJ SOLN
40.0000 mg | Freq: Once | INTRAMUSCULAR | Status: DC
  Filled 2023-10-26: qty 4

## 2023-10-26 MED ORDER — METHYLPREDNISOLONE SODIUM SUCC 125 MG IJ SOLR
125.0000 mg | Freq: Once | INTRAMUSCULAR | Status: AC
  Administered 2023-10-26: 125 mg via INTRAVENOUS
  Filled 2023-10-26: qty 2

## 2023-10-26 MED ORDER — IOHEXOL 350 MG/ML SOLN
75.0000 mL | Freq: Once | INTRAVENOUS | Status: AC | PRN
  Administered 2023-10-26: 75 mL via INTRAVENOUS

## 2023-10-26 MED ORDER — SODIUM CHLORIDE 0.9 % IV SOLN
500.0000 mg | Freq: Once | INTRAVENOUS | Status: AC
  Administered 2023-10-26: 500 mg via INTRAVENOUS
  Filled 2023-10-26: qty 5

## 2023-10-26 MED ORDER — SODIUM CHLORIDE 0.9 % IV SOLN
1.0000 g | Freq: Once | INTRAVENOUS | Status: AC
  Administered 2023-10-26: 1 g via INTRAVENOUS
  Filled 2023-10-26: qty 10

## 2023-10-26 MED ORDER — LEVETIRACETAM 500 MG PO TABS
500.0000 mg | ORAL_TABLET | Freq: Two times a day (BID) | ORAL | Status: DC
  Administered 2023-10-27 – 2023-11-02 (×13): 500 mg via ORAL
  Filled 2023-10-26 (×14): qty 1

## 2023-10-26 MED ORDER — VANCOMYCIN HCL 1500 MG/300ML IV SOLN
1500.0000 mg | Freq: Once | INTRAVENOUS | Status: AC
  Administered 2023-10-26: 1500 mg via INTRAVENOUS
  Filled 2023-10-26: qty 300

## 2023-10-26 MED ORDER — VANCOMYCIN VARIABLE DOSE PER UNSTABLE RENAL FUNCTION (PHARMACIST DOSING): Status: DC

## 2023-10-26 MED ORDER — POLYETHYLENE GLYCOL 3350 17 G PO PACK: 17.0000 g | PACK | Freq: Every day | ORAL | Status: DC | PRN

## 2023-10-26 MED ORDER — ONDANSETRON HCL 4 MG PO TABS
4.0000 mg | ORAL_TABLET | Freq: Four times a day (QID) | ORAL | Status: DC | PRN
  Administered 2023-11-01: 4 mg via ORAL
  Filled 2023-10-26: qty 1

## 2023-10-26 MED ORDER — ENSURE ENLIVE PO LIQD
237.0000 mL | Freq: Two times a day (BID) | ORAL | Status: DC
  Administered 2023-10-26 – 2023-11-02 (×12): 237 mL via ORAL
  Filled 2023-10-26 (×3): qty 237

## 2023-10-26 MED ORDER — ACETAMINOPHEN 650 MG RE SUPP
650.0000 mg | Freq: Four times a day (QID) | RECTAL | Status: DC | PRN
Start: 2023-10-26 — End: 2023-11-02

## 2023-10-26 MED ORDER — IPRATROPIUM-ALBUTEROL 0.5-2.5 (3) MG/3ML IN SOLN
3.0000 mL | Freq: Once | RESPIRATORY_TRACT | Status: AC
  Administered 2023-10-26: 3 mL via RESPIRATORY_TRACT
  Filled 2023-10-26: qty 3

## 2023-10-26 MED ORDER — SODIUM CHLORIDE 0.9 % IV SOLN
2.0000 g | INTRAVENOUS | Status: DC
  Administered 2023-10-27: 2 g via INTRAVENOUS
  Filled 2023-10-26: qty 20

## 2023-10-26 MED ORDER — FUROSEMIDE 10 MG/ML IJ SOLN
40.0000 mg | Freq: Once | INTRAMUSCULAR | Status: AC
  Administered 2023-10-26: 40 mg via INTRAVENOUS
  Filled 2023-10-26: qty 4

## 2023-10-26 MED ORDER — ALBUTEROL SULFATE HFA 108 (90 BASE) MCG/ACT IN AERS: 2.0000 | INHALATION_SPRAY | RESPIRATORY_TRACT | Status: DC | PRN

## 2023-10-26 MED ORDER — ACETAMINOPHEN 325 MG PO TABS: 650.0000 mg | ORAL_TABLET | Freq: Four times a day (QID) | ORAL | Status: DC | PRN

## 2023-10-26 MED ORDER — HEPARIN SODIUM (PORCINE) 5000 UNIT/ML IJ SOLN
5000.0000 [IU] | Freq: Three times a day (TID) | INTRAMUSCULAR | Status: DC
  Administered 2023-10-26 – 2023-11-02 (×20): 5000 [IU] via SUBCUTANEOUS
  Filled 2023-10-26 (×20): qty 1

## 2023-10-26 MED ORDER — ONDANSETRON HCL 4 MG/2ML IJ SOLN: 4.0000 mg | Freq: Four times a day (QID) | INTRAMUSCULAR | Status: DC | PRN

## 2023-10-26 MED ORDER — LACTATED RINGERS IV BOLUS
1000.0000 mL | Freq: Once | INTRAVENOUS | Status: AC
  Administered 2023-10-26: 1000 mL via INTRAVENOUS

## 2023-10-26 NOTE — Assessment & Plan Note (Addendum)
 History of congestive heart failure, not on diuretics.  Presenting with dyspnea, no peripheral signs of edema.  BNP elevated at 2518.  Last echo 2021 EF -55 to 70% with grade 1 DD.  Presenting with dyspnea, CT chest showingmoderate-sized pericardial effusion, mild pulmonary vascular congestion and mild interstitial pulmonary edema, consistent with congestive heart failure.  Small patchy density right lower lobe-atelectasis versus pneumonia or focal alveolar edema. -IV Lasix  40 twice daily x 2, further dosing pending clinical course -Obtain updated echocardiogram -Strict input output, daily weight, daily BMP

## 2023-10-26 NOTE — ED Notes (Signed)
Pt provided a turkey sandwich and water

## 2023-10-26 NOTE — Assessment & Plan Note (Signed)
Creatinine elevated at 1.7, baseline 1.2-1.4. -IV diuresis

## 2023-10-26 NOTE — Progress Notes (Signed)
 Pharmacy Antibiotic Note  Rick Williams is a 58 y.o. male admitted on 10/26/2023 with bacteremia. Patient presenting with shortness of breath and blood culture (1 bottle from first set) growing gram positive cocci (species identification pending). Patient remains afebrile with WBC 9.8. Pharmacy has been consulted for vancomycin  dosing.  Plan: Give vancomycin  load of 1500 mg IV x1 Dose vancomycin  per levels given AKI on admission (Scr 1.71, BL closer to 1.2-1.4) Monitor renal function, clinical status, and LOT Monitor blood cultures - de-escalate antibiotics as able F/u Scr in AM, possible vancomycin  random level if renal function not improving  Height: 5' 9 (175.3 cm) Weight: 70 kg (154 lb 5.2 oz) IBW/kg (Calculated) : 70.7  Temp (24hrs), Avg:96.8 F (36 C), Min:94.9 F (34.9 C), Max:97.8 F (36.6 C)  Recent Labs  Lab 10/26/23 0936 10/26/23 1035 10/26/23 1159  WBC 9.8  --   --   CREATININE 1.71*  --   --   LATICACIDVEN  --  2.0* 1.3    Estimated Creatinine Clearance: 47.2 mL/min (A) (by C-G formula based on SCr of 1.71 mg/dL (H)).    Allergies  Allergen Reactions   Benadryl [Diphenhydramine Hcl (Sleep)] Itching and Rash   Antimicrobials this admission: azithromycin  2/5 >>  ceftriaxone  2/5 >>   Dose adjustments this admission: N/A  Microbiology results: 2/5 BCx: GPC in aerobic bottle of first set, second set still pending  Thank you for involving pharmacy in this patient's care.   Damien Napoleon, PharmD Clinical Pharmacist 10/26/2023 7:42 PM

## 2023-10-26 NOTE — ED Notes (Signed)
 Unable to obtain pulse ox reading despite MD, multiple RN's, NT, and multiple machines and dinamaps with different cords and different pulse ox sensors in different locations.

## 2023-10-26 NOTE — ED Triage Notes (Signed)
 Pt arrived from [facility] in [facility] custody c/o SOB X 2 days. Pt reports Hx of COPD. Pt received an Albuterol  breathing treatment @ 0820 PTA today.

## 2023-10-26 NOTE — Assessment & Plan Note (Signed)
 Systolic 106-117. -Resume carvedilol  6.25 mg twice daily in a.m. -Also on once weekly clonidine  0.2, consider reducing dose to 0.1 -Hold Norvasc  10 mg to allow for diuresis

## 2023-10-26 NOTE — ED Provider Notes (Signed)
 [place] EMERGENCY DEPARTMENT AT [facility] Provider Note   CSN: [id number] Arrival date & time: 10/26/23  9094     History  Chief Complaint  Patient presents with   Shortness of Breath    KHARI MALLY is a 58 y.o. male with a history including hypertension, COPD, history of CVA, hypothyroidism, history of alcohol use disorder presenting [facility] with complaints of increasing shortness of breath over the past 2 days.  He has a history of COPD as mention, he does endorse wheezing and generalized feeling weak and bad, he reports coughing blood ytd,  none today, denies fever, chest pain, palpitations, denies swelling in his legs.  He was given an albuterol  MDI yesterday which helped his symptoms, but the treatment given prior to arrival did not improve the shortness of breath.  No recognized fevers, denies palpitations. He states the last time he felt like this he had the flu.  The history is provided by the patient.       Home Medications Prior to Admission medications   Medication Sig Start Date End Date Taking? Authorizing Provider  ADVAIR DISKUS 250-50 MCG/ACT AEPB Inhale 1 puff into the lungs 2 (two) times daily. 05/17/23   [provider]  albuterol  (VENTOLIN  HFA) 108 (90 Base) MCG/ACT inhaler Inhale 1 puff into the lungs every 4 (four) hours as needed for wheezing or shortness of breath. 05/24/23   [provider]  amLODipine  (NORVASC ) 10 MG tablet Take 10 mg by mouth daily. 05/24/23   [provider]  carvedilol  (COREG ) 6.25 MG tablet Take 6.25 mg by mouth 2 (two) times daily. 05/24/23   [provider]  cloNIDine  (CATAPRES  - DOSED IN MG/24 HR) 0.2 mg/24hr patch Place 0.2 mg onto the skin once a week. 05/17/23   [provider]  clopidogrel  (PLAVIX ) 75 MG tablet Take 75 mg by mouth daily. 05/24/23   [provider]  fluticasone (FLONASE) 50 MCG/ACT nasal spray Place 1 spray into both nostrils daily. 04/19/23    [provider]  folic acid  (FOLVITE ) 1 MG tablet Take 1 tablet (1 mg total) by mouth daily. 01/23/20   Rojelio Nest, DO  hydrOXYzine  (VISTARIL ) 25 MG capsule Take 1 capsule (25 mg total) by mouth every 8 (eight) hours as needed for anxiety. 05/28/23   Evonnie Lenis, MD  levETIRAcetam  (KEPPRA ) 500 MG tablet Take 500 mg by mouth 2 (two) times daily. 05/24/23   [provider]  levothyroxine  (SYNTHROID ) 50 MCG tablet Take 1 tablet (50 mcg total) by mouth daily at 6 (six) AM. 05/29/23   Tat, Lenis, MD  mirtazapine  (REMERON  SOL-TAB) 15 MG disintegrating tablet Take 15 mg by mouth at bedtime. 05/18/23   [provider]  Multiple Vitamin (DAILY-VITE) TABS Take 1 tablet by mouth daily. 05/24/23   [provider]  nicotine  (NICODERM CQ  - DOSED IN MG/24 HOURS) 21 mg/24hr patch Place 21 mg onto the skin daily. 05/17/23   [provider]  thiamine  100 MG tablet Take 1 tablet (100 mg total) by mouth daily. 01/23/20   Rojelio Nest, DO      Allergies    Benadryl [diphenhydramine hcl (sleep)]    Review of Systems   Review of Systems  Constitutional:  Negative for chills and fever.  HENT:  Negative for congestion and sore throat.   Eyes: Negative.   Respiratory:  Positive for shortness of breath. Negative for cough, choking and chest tightness.   Cardiovascular:  Negative for chest  pain and palpitations.  Gastrointestinal:  Negative for abdominal pain and nausea.  Genitourinary: Negative.   Musculoskeletal:  Negative for arthralgias, joint swelling and neck pain.  Skin: Negative.  Negative for rash and wound.  Neurological:  Negative for dizziness, weakness, light-headedness, numbness and headaches.  Psychiatric/Behavioral: Negative.      Physical Exam Updated Vital Signs BP (!) 110/90   Pulse 74   Temp 97.6 F (36.4 C) (Axillary)   Resp 17   Ht 5' 9 (1.753 m)   Wt 70 kg   SpO2 96%   BMI 22.79 kg/m   On 2L [place],  SpO2 - 95%  Physical Exam Vitals and nursing  note reviewed.  Constitutional:      Appearance: He is well-developed.  HENT:     Head: Normocephalic and atraumatic.  Eyes:     Conjunctiva/sclera: Conjunctivae normal.  Cardiovascular:     Rate and Rhythm: Normal rate and regular rhythm.     Heart sounds: Normal heart sounds.  Pulmonary:     Effort: Pulmonary effort is normal.     Breath sounds: Decreased breath sounds present. No wheezing, rhonchi or rales.     Comments: Accessory muscle use.  Tachypnea. Abdominal:     General: Bowel sounds are normal.     Palpations: Abdomen is soft.     Tenderness: There is no abdominal tenderness.  Musculoskeletal:        General: Normal range of motion.     Cervical back: Normal range of motion.     Right lower leg: No tenderness. No edema.     Left lower leg: No tenderness. No edema.  Skin:    General: Skin is warm and dry.  Neurological:     Mental Status: He is alert.     ED Results / Procedures / Treatments   Labs (all labs ordered are listed, but only abnormal results are displayed) Labs Reviewed  CBC WITH DIFFERENTIAL/PLATELET - Abnormal; Notable for the following components:      Result Value   Hemoglobin 11.8 (*)    HCT 37.1 (*)    RDW 15.9 (*)    nRBC 1.2 (*)    All other components within normal limits  BASIC METABOLIC PANEL - Abnormal; Notable for the following components:   CO2 20 (*)    Glucose, Bld 121 (*)    BUN 42 (*)    Creatinine, Ser 1.71 (*)    GFR, Estimated 46 (*)    All other components within normal limits  LACTIC ACID, PLASMA - Abnormal; Notable for the following components:   Lactic Acid, Venous 2.0 (*)    All other components within normal limits  BLOOD GAS, ARTERIAL - Abnormal; Notable for the following components:   pCO2 arterial 30 (*)    pO2, Arterial 80 (*)    Acid-base deficit 2.1 (*)    All other components within normal limits  BRAIN NATRIURETIC PEPTIDE - Abnormal; Notable for the following components:   B Natriuretic Peptide 2,518.0  (*)    All other components within normal limits  TROPONIN I (HIGH SENSITIVITY) - Abnormal; Notable for the following components:   Troponin I (High Sensitivity) 25 (*)    All other components within normal limits  RESP PANEL BY RT-PCR (RSV, FLU A&B, COVID)  RVPGX2  CULTURE, BLOOD (ROUTINE X 2)  CULTURE, BLOOD (ROUTINE X 2)  LACTIC ACID, PLASMA  PROCALCITONIN    EKG EKG Interpretation Date/Time:  Wednesday October 26 2023 16:55:13 EST Ventricular Rate:  74 PR Interval:  183 QRS Duration:  105 QT Interval:  453 QTC Calculation: 503 R Axis:   124  Text Interpretation: Sinus rhythm Left atrial enlargement Probable right ventricular hypertrophy Nonspecific T abnormalities, inferior leads Prolonged QT interval Confirmed by Suzette Pac [phone]) on 10/26/2023 4:57:38 PM  Radiology CT Angio Chest PE W and/or Wo Contrast Result Date: 10/26/2023 CLINICAL DATA:  Shortness of breath. Clinical concern for pulmonary embolism. EXAM: CT ANGIOGRAPHY CHEST WITH CONTRAST TECHNIQUE: Multidetector CT imaging of the chest was performed using the standard protocol during bolus administration of intravenous contrast. Multiplanar CT image reconstructions and MIPs were obtained to evaluate the vascular anatomy. RADIATION DOSE REDUCTION: This exam was performed according to the departmental dose-optimization program which includes automated exposure control, adjustment of the mA and/or kV according to patient size and/or use of iterative reconstruction technique. CONTRAST:  75mL OMNIPAQUE  IOHEXOL  350 MG/ML SOLN COMPARISON:  Chest radiographs obtained earlier today. FINDINGS: Cardiovascular: Normally opacified pulmonary arteries with no pulmonary arterial filling defects seen. Enlarged heart moderate-sized pericardial effusion with a maximum thickness of 2.6 cm. Atheromatous calcifications, including the coronary arteries and aorta. Dilated central pulmonary arteries with a main pulmonary artery diameter of 3.8 cm.  Mediastinum/Nodes: Enlarged mediastinal and bilateral hilar lymph nodes. These include a right inferior paratracheal node with a short axis diameter of 10 mm on image number 43/4, AP window lymph node with a short axis diameter of 10 mm on image number 43/4, right hilar node with a short axis diameter of 17 mm on image number 51/4 and subcarinal node with a short axis diameter of 10 mm on image number 57/4. Borderline enlarged left axillary nodes, the largest measuring 8 mm in short axis diameter on image number 39/4 mildly enlarged gastrohepatic ligament lymph node with a short axis diameter of 10 mm on image number 105/4. Unremarkable thyroid gland, trachea and esophagus. Lungs/Pleura: Prominent pulmonary vasculature and mildly prominent interstitial markings, especially in the lower lung zones. Small amount of patchy density in the right lower lobe. No pleural fluid. Upper Abdomen: Mildly dilated inferior vena cava and hepatic veins with reflux of contrast from the right atrium into the hepatic veins Musculoskeletal: Mild dextroconvex thoracic scoliosis. Lower cervical spine degenerative changes. Review of the MIP images confirms the above findings. IMPRESSION: 1. No pulmonary embolism. 2. Cardiomegaly with a moderate-sized pericardial effusion, mild pulmonary vascular congestion and mild interstitial pulmonary edema, consistent with congestive heart failure. 3. Small amount of patchy density in the right lower lobe, which could represent atelectasis, pneumonia or focal alveolar edema. 4. Dilated central pulmonary arteries, consistent with pulmonary arterial hypertension. 5. Mild mediastinal, bilateral hilar, left axillary and gastrohepatic ligament lymphadenopathy. This is nonspecific. 6.  Calcific coronary artery and aortic atherosclerosis. Aortic Atherosclerosis (ICD10-I70.0). Electronically Signed   By: Elspeth Bathe M.D.   On: 10/26/2023 14:33   DG Chest 2 View Result Date: 10/26/2023 CLINICAL DATA:   Shortness of breath. EXAM: CHEST - 2 VIEW COMPARISON:  05/25/2023. FINDINGS: Redemonstration of linear area of scarring/atelectasis in the right lung apex. Bilateral lung fields are otherwise clear. No acute consolidation or lung collapse. No pulmonary edema. Bilateral costophrenic angles are clear. Mildly enlarged cardio-mediastinal silhouette, which is likely due to AP technique. No acute osseous abnormalities. The soft tissues are within normal limits. IMPRESSION: *No active cardiopulmonary disease. Electronically Signed   By: Ree Molt M.D.   On: 10/26/2023 11:39    Procedures Procedures    Medications Ordered in ED Medications  albuterol  (VENTOLIN   HFA) 108 (90 Base) MCG/ACT inhaler 2 puff (has no administration in time range)  ipratropium-albuterol  (DUONEB) 0.5-2.5 (3) MG/3ML nebulizer solution 3 mL (3 mLs Nebulization Given 10/26/23 1033)  lactated ringers  bolus 1,000 mL (0 mLs Intravenous Stopped 10/26/23 1139)  cefTRIAXone  (ROCEPHIN ) 1 g in sodium chloride  0.9 % 100 mL IVPB (0 g Intravenous Stopped 10/26/23 1255)  azithromycin  (ZITHROMAX ) 500 mg in sodium chloride  0.9 % 250 mL IVPB (0 mg Intravenous Stopped 10/26/23 1508)  methylPREDNISolone  sodium succinate (SOLU-MEDROL ) 125 mg/2 mL injection 125 mg (125 mg Intravenous Given 10/26/23 1136)  iohexol  (OMNIPAQUE ) 350 MG/ML injection 75 mL (75 mLs Intravenous Contrast Given 10/26/23 1244)  furosemide  (LASIX ) injection 40 mg (40 mg Intravenous Given 10/26/23 1540)    ED Course/ Medical Decision Making/ A&P                                 Medical Decision Making Patient with a history of COPD, also history of CVA, hypothyroidism presenting from [facility] with increased shortness of breath along with significant hypoxia over the past 2 days.  He initially presented here with oxygen level of 75% on room air, on 3 l if patient can be encouraged to breathe through his nose which has been challenging for him his oxygen quickly pops to the low 90s.  He denies  chest pain, no fevers, no significant cough.  He has no peripheral edema or rales on his exam suggesting CHF, he denies fever, although pneumonia remains in the differential as does influenza and RSV.  Initially we had difficult time finding pedal pulses, his extremities were cool to touch, rectal temperature revealed a core temperature of 94.9.  He was put on a [employer], blood cultures and lactic acids were also ordered, he was given IV fluids as he did appear dry on exam, there is concern for sepsis, Rocephin  and Zithromax  were given IV presuming pneumonia as source of infection.  Patient's oxygen level remains in the low to mid 90s on 3 L nasal cannula, although he continues to mouth breathe, when encouraged to use the cannula oxygen levels are acceptable.  He is put on a mask to help with this.  His BNP is significantly elevated at 2518, his plain film x-ray does not suggest fluid overload, CT imaging was obtained for better assessment of this and to rule out PE.  He does not have a PE but he does have a moderate pericardial effusion which is new since 2021, also dilated central pulmonary arteries consistent with pulmonary hypertension and mild pulmonary vascular congestion.  He was then given Lasix  40 mg IV.  Patient will require admission.  Amount and/or Complexity of Data Reviewed Labs: ordered.    Details: Significant labs including a BNP of 2518.  His initial lactic acid was borderline at 2.0, repeat lactic acid 1.3.  His venous blood gas significant for a pCO2 of 30 and a pO2 of 80 with a acid-base deficit of 2.1.  His respiratory panel is negative, his initial troponin is 25, CBC normal WBC count at 9.8, mild anemia with a hemoglobin of 11.8 is being met suggest dehydration with a BUN of 42 and a creatinine of 1.71. Radiology: ordered.    Details: Plain film chest imaging with no active cardiopulmonary disease.  CT chest with above findings. ECG/medicine tests: ordered.    Details: NSR,  rate  74 prolonged QTc 503. LAE,  Discussion of management or  test interpretation with external provider(s): Patient was discussed with Dr. Debera of cardiology, he does recommend an echocardiogram after which cardiology can weigh in on any further recommendations.  His hypoxia does not appear to be related to the pericardial effusion.  Spoke with Dr. Pearlean who accepts pt for admission.  Risk Decision regarding hospitalization.           Final Clinical Impression(s) / ED Diagnoses Final diagnoses:  Hypoxia  Congestive heart failure, unspecified HF chronicity, unspecified heart failure type [facility])  Pericardial effusion    Rx / DC Orders ED Discharge Orders     None         Birdena Mliss RIGGERS 10/26/23 1659    Yolande Lamar BROCKS, MD 10/27/23 [phone]

## 2023-10-26 NOTE — Assessment & Plan Note (Signed)
Has been incarcerated since September 2024.

## 2023-10-26 NOTE — H&P (Addendum)
 History and Physical    PJ ZEHNER FMW:[id number] DOB: 28-Aug-1966 DOA: 10/26/2023  PCP: Patient, No Pcp Per   Patient coming from: [facility]  I have personally briefly reviewed patient's old medical records in [facility] Link  Chief Complaint: Difficulty breathing  HPI: Rick Williams is a 58 y.o. male with medical history significant for congestive heart failure, COPD, hypertension.  Patient was brought to the ED with complaints of difficulty breathing over the past several days.  He also reports a cough.  He denies chest pain.  No lower extremity swelling, abdominal bloating. He has been at the [facility] since September 1.  ED Course: Temperature down to 94.9 in ED improved with warming blanket now 97.6.  Heart rate 74-80.  Respirate rate 17-20.  Blood pressure systolic 106-117.  O2 sats recorded at 75% on room air, but this may be erroneous as ED staff had difficulty getting pulse ox on patient. Creatinine mildly elevated at 1.7 from baseline. Troponin 25 Lactic acid 2 > 1.3 after 1 L bolus BNP elevated at 2518. Chest x-ray without acute abnormality CTA chest negative for PE, shows moderate-sized pericardial effusion, mild pulmonary vascular congestion and mild interstitial pulmonary edema consistent with CHF.  Mild patchy density right lower lobe-atelectasis pneumonia or focal alveolar edema. Initial concern for sepsis as he was hypothermic, started on IV ceftriaxone  and azithromycin .  1 L bolus given.  Solu-Medrol  125 mg given.  Albuterol  nebs given.   Later with chest imaging findings Lasix  40 mg x 1 given.  EDP talked to cardiology- Dr. Debera, okay to stay here, hospitalist team can consult after echo has been done.  Review of Systems: As per HPI all other systems reviewed and negative.  Past Medical History:  Diagnosis Date   Alcohol abuse    Cerebral hemorrhage (HCC) 2014   COPD (chronic obstructive pulmonary disease) (HCC)     Hemothorax on left    Hypertension    Pneumonia    Stroke [facility]) 2007   no deficits   Thyroid disease     History reviewed. No pertinent surgical history.   reports that he has been smoking cigarettes. He has never used smokeless tobacco. He reports that he does not currently use alcohol. He reports that he does not currently use drugs after having used the following drugs: Marijuana.  Allergies  Allergen Reactions   Benadryl [Diphenhydramine Hcl (Sleep)] Itching and Rash    Family History  Problem Relation Age of Onset   Hypertension Mother    Hypertension Father    Prior to Admission medications   Medication Sig Start Date End Date Taking? Authorizing Provider  ADVAIR DISKUS 250-50 MCG/ACT AEPB Inhale 1 puff into the lungs 2 (two) times daily. 05/17/23   [provider]  albuterol  (VENTOLIN  HFA) 108 (90 Base) MCG/ACT inhaler Inhale 1 puff into the lungs every 4 (four) hours as needed for wheezing or shortness of breath. 05/24/23   [provider]  amLODipine  (NORVASC ) 10 MG tablet Take 10 mg by mouth daily. 05/24/23   [provider]  carvedilol  (COREG ) 6.25 MG tablet Take 6.25 mg by mouth 2 (two) times daily. 05/24/23   [provider]  cloNIDine  (CATAPRES  - DOSED IN MG/24 HR) 0.2 mg/24hr patch Place 0.2 mg onto the skin once a week. 05/17/23   [provider]  clopidogrel  (PLAVIX ) 75 MG tablet Take 75 mg by mouth daily. 05/24/23   [provider]  fluticasone (FLONASE) 50 MCG/ACT nasal  spray Place 1 spray into both nostrils daily. 04/19/23   [provider]  folic acid  (FOLVITE ) 1 MG tablet Take 1 tablet (1 mg total) by mouth daily. 01/23/20   Rojelio Nest, DO  hydrOXYzine  (VISTARIL ) 25 MG capsule Take 1 capsule (25 mg total) by mouth every 8 (eight) hours as needed for anxiety. 05/28/23   Evonnie Lenis, MD  levETIRAcetam  (KEPPRA ) 500 MG tablet Take 500 mg by mouth 2 (two) times daily. 05/24/23   [provider]  levothyroxine   (SYNTHROID ) 50 MCG tablet Take 1 tablet (50 mcg total) by mouth daily at 6 (six) AM. 05/29/23   Tat, Lenis, MD  mirtazapine  (REMERON  SOL-TAB) 15 MG disintegrating tablet Take 15 mg by mouth at bedtime. 05/18/23   [provider]  Multiple Vitamin (DAILY-VITE) TABS Take 1 tablet by mouth daily. 05/24/23   [provider]  nicotine  (NICODERM CQ  - DOSED IN MG/24 HOURS) 21 mg/24hr patch Place 21 mg onto the skin daily. 05/17/23   [provider]  thiamine  100 MG tablet Take 1 tablet (100 mg total) by mouth daily. 01/23/20   Rojelio Nest, DO    Physical Exam: Vitals:   10/26/23 0924 10/26/23 1145 10/26/23 1315 10/26/23 1539  BP:  (!) 117/100 (!) 110/90   Pulse:  75 74   Resp:  17 17   Temp:  (!) 94.9 F (34.9 C)  97.6 F (36.4 C)  TempSrc:  Rectal  Axillary  SpO2:  93% 96%   Weight: 70 kg     Height: 5' 9 (1.753 m)       Constitutional: Chronically ill-appearing, calm, comfortable Vitals:   10/26/23 0924 10/26/23 1145 10/26/23 1315 10/26/23 1539  BP:  (!) 117/100 (!) 110/90   Pulse:  75 74   Resp:  17 17   Temp:  (!) 94.9 F (34.9 C)  97.6 F (36.4 C)  TempSrc:  Rectal  Axillary  SpO2:  93% 96%   Weight: 70 kg     Height: 5' 9 (1.753 m)      Eyes: PERRL, lids and conjunctivae normal ENMT: On O2 via face mask, mucous membranes are moist.  Neck: normal, supple, no masses, no thyromegaly Respiratory: On Ventimask, clear to auscultation bilaterally, no wheezing, no crackles. Normal respiratory effort. No accessory muscle use.  Cardiovascular: Regular rate and rhythm, no murmurs / rubs / gallops. No extremity edema.  Extremities warm. Abdomen: not distended, no tenderness, no masses palpated. No hepatosplenomegaly. Musculoskeletal: no clubbing / cyanosis. No joint deformity upper and lower extremities. Skin: very dry skin, no lesions, ulcers. No induration Neurologic: No facial asymmetry, moving extremities spontaneously speech fluent Psychiatric: Normal  judgment and insight. Alert and oriented x 3. Normal mood.   Labs on Admission: I have personally reviewed following labs and imaging studies  CBC: Recent Labs  Lab 10/26/23 0936  WBC 9.8  NEUTROABS 7.4  HGB 11.8*  HCT 37.1*  MCV 85.3  PLT 281   Basic Metabolic Panel: Recent Labs  Lab 10/26/23 0936  NA 137  K 4.4  CL 106  CO2 20*  GLUCOSE 121*  BUN 42*  CREATININE 1.71*  CALCIUM  9.1   Radiological Exams on Admission: CT Angio Chest PE W and/or Wo Contrast Result Date: 10/26/2023 CLINICAL DATA:  Shortness of breath. Clinical concern for pulmonary embolism. EXAM: CT ANGIOGRAPHY CHEST WITH CONTRAST TECHNIQUE: Multidetector CT imaging of the chest was performed using the standard protocol during bolus administration of intravenous contrast. Multiplanar CT image reconstructions and MIPs  were obtained to evaluate the vascular anatomy. RADIATION DOSE REDUCTION: This exam was performed according to the departmental dose-optimization program which includes automated exposure control, adjustment of the mA and/or kV according to patient size and/or use of iterative reconstruction technique. CONTRAST:  75mL OMNIPAQUE  IOHEXOL  350 MG/ML SOLN COMPARISON:  Chest radiographs obtained earlier today. FINDINGS: Cardiovascular: Normally opacified pulmonary arteries with no pulmonary arterial filling defects seen. Enlarged heart moderate-sized pericardial effusion with a maximum thickness of 2.6 cm. Atheromatous calcifications, including the coronary arteries and aorta. Dilated central pulmonary arteries with a main pulmonary artery diameter of 3.8 cm. Mediastinum/Nodes: Enlarged mediastinal and bilateral hilar lymph nodes. These include a right inferior paratracheal node with a short axis diameter of 10 mm on image number 43/4, AP window lymph node with a short axis diameter of 10 mm on image number 43/4, right hilar node with a short axis diameter of 17 mm on image number 51/4 and subcarinal node with a  short axis diameter of 10 mm on image number 57/4. Borderline enlarged left axillary nodes, the largest measuring 8 mm in short axis diameter on image number 39/4 mildly enlarged gastrohepatic ligament lymph node with a short axis diameter of 10 mm on image number 105/4. Unremarkable thyroid gland, trachea and esophagus. Lungs/Pleura: Prominent pulmonary vasculature and mildly prominent interstitial markings, especially in the lower lung zones. Small amount of patchy density in the right lower lobe. No pleural fluid. Upper Abdomen: Mildly dilated inferior vena cava and hepatic veins with reflux of contrast from the right atrium into the hepatic veins Musculoskeletal: Mild dextroconvex thoracic scoliosis. Lower cervical spine degenerative changes. Review of the MIP images confirms the above findings. IMPRESSION: 1. No pulmonary embolism. 2. Cardiomegaly with a moderate-sized pericardial effusion, mild pulmonary vascular congestion and mild interstitial pulmonary edema, consistent with congestive heart failure. 3. Small amount of patchy density in the right lower lobe, which could represent atelectasis, pneumonia or focal alveolar edema. 4. Dilated central pulmonary arteries, consistent with pulmonary arterial hypertension. 5. Mild mediastinal, bilateral hilar, left axillary and gastrohepatic ligament lymphadenopathy. This is nonspecific. 6.  Calcific coronary artery and aortic atherosclerosis. Aortic Atherosclerosis (ICD10-I70.0). Electronically Signed   By: Elspeth Bathe M.D.   On: 10/26/2023 14:33   DG Chest 2 View Result Date: 10/26/2023 CLINICAL DATA:  Shortness of breath. EXAM: CHEST - 2 VIEW COMPARISON:  05/25/2023. FINDINGS: Redemonstration of linear area of scarring/atelectasis in the right lung apex. Bilateral lung fields are otherwise clear. No acute consolidation or lung collapse. No pulmonary edema. Bilateral costophrenic angles are clear. Mildly enlarged cardio-mediastinal silhouette, which is likely  due to AP technique. No acute osseous abnormalities. The soft tissues are within normal limits. IMPRESSION: *No active cardiopulmonary disease. Electronically Signed   By: Ree Molt M.D.   On: 10/26/2023 11:39    EKG: Independently reviewed.  Sinus rhythm, rate 74, QTc 530.  No significant change from prior.  Assessment/Plan Active Problems:   AKI (acute kidney injury) (HCC)   Acute diastolic CHF (congestive heart failure) (HCC)   Essential hypertension, malignant   Alcohol abuse   Assessment and Plan: Acute diastolic CHF (congestive heart failure) (HCC) History of congestive heart failure, not on diuretics.  Presenting with dyspnea, no peripheral signs of edema.  Possibly hypoxic sats down to 75% on room air,- ?? Accuracy, as he was hypothermic, and has very dry deeply pigmented skin . -  BNP elevated at 2518.  Last echo 2021 EF -55 to 70% with grade 1 DD.   -  CT chest showingmoderate-sized pericardial effusion, mild pulmonary vascular congestion and mild interstitial pulmonary edema, consistent with congestive heart failure.   -IV Lasix  40 twice daily x 2, further dosing pending clinical course -Obtain updated echocardiogram -Strict input output, daily weight, daily BMP  Hypothermia-down to 94.9 in the ED, improved with Bair hugger's.  No leukocytosis.  Not meeting sepsis criteria.  CTA chest with small patchy density RLL,-  atelectasis, vs pneumonia vs focal alveolar edema.  Lactic acid 2 > 1.3 improved after 1 L bolus.  IV ceftriaxone  and azithromycin  started in ED initially with concern for sepsis. -Procalcitonin negative, but lab called- gram positive in one blood culture.  Continue antibiotics with vancomycin  and ceftriaxone  for now. -Check UA -Follow-up blood cultures  AKI (acute kidney injury) (HCC) Creatinine elevated at 1.7, baseline 1.2-1.4. -IV diuresis  Alcohol abuse Has been incarcerated since September 2024.  Essential hypertension, malignant Systolic  106-117. -Resume carvedilol  6.25 mg twice daily in a.m. -Also on once weekly clonidine  0.2, consider reducing dose to 0.1 -Hold Norvasc  10 mg to allow for diuresis  Incarcerated  Prolonged QTc-503.  Potassium 4.4. He is on hydroxyzine  -Check magnesium    DVT prophylaxis: Heparin  Code Status: Full code Family Communication: Corporate treasurer at bedside Disposition Plan: ~ 2 days Consults called: None Admission status: Inpt tele I certify that at the point of admission it is my clinical judgment that the patient will require inpatient hospital care spanning beyond 2 midnights from the point of admission due to high intensity of service, high risk for further deterioration and high frequency of surveillance required.   Author: Tully FORBES Carwin, MD 10/26/2023 7:21 PM  For on call review [email or web address].

## 2023-10-26 NOTE — Assessment & Plan Note (Deleted)
 History of congestive heart failure.  Last echo 2021 EF -55 to 70% with grade 1 DD.  Presenting with dyspnea, CT chest showingmoderate-sized pericardial effusion, mild pulmonary vascular congestion and mild interstitial pulmonary edema, consistent with congestive heart failure.

## 2023-10-27 ENCOUNTER — Inpatient Hospital Stay (HOSPITAL_COMMUNITY): Payer: MEDICAID

## 2023-10-27 ENCOUNTER — Other Ambulatory Visit (HOSPITAL_COMMUNITY): Payer: Self-pay | Admitting: *Deleted

## 2023-10-27 DIAGNOSIS — R4182 Altered mental status, unspecified: Secondary | ICD-10-CM

## 2023-10-27 DIAGNOSIS — I5031 Acute diastolic (congestive) heart failure: Secondary | ICD-10-CM

## 2023-10-27 DIAGNOSIS — N183 Chronic kidney disease, stage 3 unspecified: Secondary | ICD-10-CM

## 2023-10-27 DIAGNOSIS — I3139 Other pericardial effusion (noninflammatory): Secondary | ICD-10-CM

## 2023-10-27 LAB — RAPID URINE DRUG SCREEN, HOSP PERFORMED
Amphetamines: NOT DETECTED
Barbiturates: NOT DETECTED
Benzodiazepines: NOT DETECTED
Cocaine: NOT DETECTED
Opiates: NOT DETECTED
Tetrahydrocannabinol: NOT DETECTED

## 2023-10-27 LAB — BLOOD GAS, ARTERIAL
Acid-base deficit: 4.7 mmol/L — ABNORMAL HIGH (ref 0.0–2.0)
Bicarbonate: 19 mmol/L — ABNORMAL LOW (ref 20.0–28.0)
O2 Saturation: 96 %
Patient temperature: 37
pCO2 arterial: 30 mm[Hg] — ABNORMAL LOW (ref 32–48)
pH, Arterial: 7.41 (ref 7.35–7.45)
pO2, Arterial: 73 mm[Hg] — ABNORMAL LOW (ref 83–108)

## 2023-10-27 LAB — CBC
HCT: 37 % — ABNORMAL LOW (ref 39.0–52.0)
Hemoglobin: 12 g/dL — ABNORMAL LOW (ref 13.0–17.0)
MCH: 27.3 pg (ref 26.0–34.0)
MCHC: 32.4 g/dL (ref 30.0–36.0)
MCV: 84.3 fL (ref 80.0–100.0)
Platelets: 302 10*3/uL (ref 150–400)
RBC: 4.39 MIL/uL (ref 4.22–5.81)
RDW: 15.8 % — ABNORMAL HIGH (ref 11.5–15.5)
WBC: 8.3 10*3/uL (ref 4.0–10.5)
nRBC: 1.8 % — ABNORMAL HIGH (ref 0.0–0.2)

## 2023-10-27 LAB — HEPATIC FUNCTION PANEL
ALT: 15 U/L (ref 0–44)
AST: 23 U/L (ref 15–41)
Albumin: 2.9 g/dL — ABNORMAL LOW (ref 3.5–5.0)
Alkaline Phosphatase: 80 U/L (ref 38–126)
Bilirubin, Direct: <0.1 mg/dL (ref 0.0–0.2)
Total Bilirubin: 0.6 mg/dL (ref 0.0–1.2)
Total Protein: 8.5 g/dL — ABNORMAL HIGH (ref 6.5–8.1)

## 2023-10-27 LAB — BASIC METABOLIC PANEL
Anion gap: 11 (ref 5–15)
BUN: 51 mg/dL — ABNORMAL HIGH (ref 6–20)
CO2: 19 mmol/L — ABNORMAL LOW (ref 22–32)
Calcium: 8.6 mg/dL — ABNORMAL LOW (ref 8.9–10.3)
Chloride: 105 mmol/L (ref 98–111)
Creatinine, Ser: 1.73 mg/dL — ABNORMAL HIGH (ref 0.61–1.24)
GFR, Estimated: 45 mL/min — ABNORMAL LOW (ref >60)
Glucose, Bld: 130 mg/dL — ABNORMAL HIGH (ref 70–99)
Potassium: 4 mmol/L (ref 3.5–5.1)
Sodium: 135 mmol/L (ref 135–145)

## 2023-10-27 LAB — ECHOCARDIOGRAM COMPLETE
Area-P 1/2: 1.46 cm2
Height: 69 in
S' Lateral: 1.7 cm
Weight: 2469.15 [oz_av]

## 2023-10-27 LAB — LACTIC ACID, PLASMA: Lactic Acid, Venous: 1.3 mmol/L (ref 0.5–1.9)

## 2023-10-27 MED ORDER — SODIUM CHLORIDE 0.9 % IV SOLN: INTRAVENOUS | Status: AC | PRN

## 2023-10-27 MED ORDER — IPRATROPIUM-ALBUTEROL 0.5-2.5 (3) MG/3ML IN SOLN
3.0000 mL | Freq: Four times a day (QID) | RESPIRATORY_TRACT | Status: DC
Start: 2023-10-27 — End: 2023-10-27
  Administered 2023-10-27: 3 mL via RESPIRATORY_TRACT
  Filled 2023-10-27: qty 3

## 2023-10-27 MED ORDER — BUDESONIDE 0.5 MG/2ML IN SUSP: 0.5000 mg | Freq: Two times a day (BID) | RESPIRATORY_TRACT | Status: DC

## 2023-10-27 MED ORDER — BUDESONIDE 0.5 MG/2ML IN SUSP
0.5000 mg | Freq: Two times a day (BID) | RESPIRATORY_TRACT | Status: DC
  Administered 2023-10-27 – 2023-10-31 (×6): 0.5 mg via RESPIRATORY_TRACT
  Filled 2023-10-27 (×7): qty 2

## 2023-10-27 MED ORDER — VANCOMYCIN HCL IN DEXTROSE 1-5 GM/200ML-% IV SOLN
1000.0000 mg | INTRAVENOUS | Status: DC
  Administered 2023-10-27: 1000 mg via INTRAVENOUS
  Filled 2023-10-27: qty 200

## 2023-10-27 MED ORDER — ATORVASTATIN CALCIUM 40 MG PO TABS
40.0000 mg | ORAL_TABLET | Freq: Every day | ORAL | Status: DC
  Administered 2023-10-27 – 2023-11-01 (×6): 40 mg via ORAL
  Filled 2023-10-27 (×6): qty 1

## 2023-10-27 MED ORDER — IPRATROPIUM-ALBUTEROL 0.5-2.5 (3) MG/3ML IN SOLN: 3.0000 mL | Freq: Four times a day (QID) | RESPIRATORY_TRACT | Status: DC

## 2023-10-27 MED ORDER — METHYLPREDNISOLONE SODIUM SUCC 125 MG IJ SOLR
80.0000 mg | Freq: Every day | INTRAMUSCULAR | Status: DC
  Administered 2023-10-27 – 2023-10-28 (×2): 80 mg via INTRAVENOUS
  Filled 2023-10-27 (×2): qty 2

## 2023-10-27 MED ORDER — IPRATROPIUM-ALBUTEROL 0.5-2.5 (3) MG/3ML IN SOLN
3.0000 mL | Freq: Two times a day (BID) | RESPIRATORY_TRACT | Status: DC
  Administered 2023-10-28 – 2023-10-31 (×4): 3 mL via RESPIRATORY_TRACT
  Filled 2023-10-27 (×6): qty 3

## 2023-10-27 MED ORDER — LEVOTHYROXINE SODIUM 100 MCG PO TABS
100.0000 ug | ORAL_TABLET | Freq: Every day | ORAL | Status: DC
  Administered 2023-10-27 – 2023-11-02 (×7): 100 ug via ORAL
  Filled 2023-10-27 (×7): qty 1

## 2023-10-27 NOTE — Progress Notes (Signed)
   10/27/23 1238  ReDS Vest / Clip  Station Marker C  Ruler Value 29  ReDS Value Range < 36  ReDS Actual Value 31

## 2023-10-27 NOTE — Plan of Care (Signed)

## 2023-10-27 NOTE — Plan of Care (Signed)
 ?  Problem: Clinical Measurements: ?Goal: Ability to maintain clinical measurements within normal limits will improve ?Outcome: Progressing ?Goal: Will remain free from infection ?Outcome: Progressing ?Goal: Diagnostic test results will improve ?Outcome: Progressing ?  ?

## 2023-10-27 NOTE — Consult Note (Addendum)
 Cardiology Consultation   Patient ID: Rick Williams MRN: [id number]; DOB: 1966/02/09  Admit date: 10/26/2023 Date of Consult: 10/27/2023  PCP:  Patient, No Pcp Per   [facility] Providers Cardiologist:  New to [facility]  Patient Profile:   Rick Williams is a 58 y.o. male with a hx of COPD, HTN, Hypothyroidism and history of alcohol use who is being seen 10/27/2023 for the evaluation of CHF at the request of Dr. Cheryle.  History of Present Illness:   [name] presented to [facility] ED on 10/26/2023 from [facility] jail for evaluation of worsening shortness of breath over the past 2 days. Upon arrival to the ED, he was hypoxic with oxygen saturations at 75% on room air. This quickly improved on 3 L nasal cannula. Was also found to be hypothermic with rectal temp at 94.9 and a Bair hugger was applied.   Initial labs showed WBC 9.8, Hgb 11.8, platelets 281, Na+ 137, K+ 4.4 and creatinine 1.71 (previously 1.2- 1.4 in 05/2023).  Hs troponin values at 25 and 16.  BNP elevated at 2518. procalcitonin less than 0.10.  EKG showed NSR, HR 74 with slight ST depression along the inferior leads and noted on prior tracings. Negative for COVID, influenza and RSV.  Blood cultures pending.  Initial lactic acid at 2.0 with repeat at 1.3. CXR showed no active cardiopulmonary disease. CTA showed no evidence of a PE but was noted to have cardiomegaly and a moderate size pericardial effusion with mild vascular congestion and mild interstitial edema consistent with CHF.  Also noted to have a patchy density in the right lower lobe which may represent atelectasis, pneumonia or edema. The report also mentioned dilated central pulmonary arteries concerning for pulmonary arterial hypertension and coronary artery calcification.   He received IV steroids and a breathing treatment upon admission. Was also started on IV antibiotics and received a fluid bolus given concerns for sepsis. He was started  on IV Lasix  40 mg daily but only received 1 dose as his morning dose was held today given hypotension. Recorded net output of -580 mL thus far. Repeat labs today show creatinine is stable at 1.73.  In talking with the patient today, he will awaken but quickly falls back asleep. The officer who was at the bedside said this is very unusual for him as he is usually conversational. He does report having worsening shortness of breath a few days prior to arrival. Reports intermittent chest discomfort as well. No specific orthopnea, PND or pitting edema. He is unable to answer my questions in regards to personal history of cardiac issues or family history.  Past Medical History:  Diagnosis Date   Alcohol abuse    Cerebral hemorrhage (HCC) 2014   COPD (chronic obstructive pulmonary disease) (HCC)    Hemothorax on left    Hypertension    Pneumonia    Stroke [facility]) 2007   no deficits   Thyroid disease     History reviewed. No pertinent surgical history.   Home Medications:  Prior to Admission medications   Medication Sig Start Date End Date Taking? Authorizing Provider  atorvastatin  (LIPITOR) 40 MG tablet Take 40 mg by mouth at bedtime. 07/21/23  Yes [provider]  levothyroxine  (SYNTHROID ) 100 MCG tablet Take 100 mcg by mouth daily. 10/21/23  Yes [provider]  losartan-hydrochlorothiazide  (HYZAAR) 100-12.5 MG tablet Take 1 tablet by mouth daily. 07/21/23  Yes [provider]  mirtazapine  (REMERON ) 15 MG tablet Take 15  mg by mouth at bedtime. 10/21/23  Yes [provider]  ADVAIR DISKUS 250-50 MCG/ACT AEPB Inhale 1 puff into the lungs 2 (two) times daily. 05/17/23   [provider]  albuterol  (VENTOLIN  HFA) 108 (90 Base) MCG/ACT inhaler Inhale 1 puff into the lungs every 4 (four) hours as needed for wheezing or shortness of breath. 05/24/23   [provider]  amLODipine  (NORVASC ) 10 MG tablet Take 10 mg by mouth daily. 05/24/23   [provider]  carvedilol  (COREG ) 6.25 MG tablet Take 6.25 mg by mouth 2 (two) times daily. 05/24/23   [provider]  cloNIDine  (CATAPRES  - DOSED IN MG/24 HR) 0.2 mg/24hr patch Place 0.2 mg onto the skin once a week. 05/17/23   [provider]  cloNIDine  (CATAPRES ) 0.2 MG tablet Take 0.2 mg by mouth 2 (two) times daily. 10/21/23   [provider]  clopidogrel  (PLAVIX ) 75 MG tablet Take 75 mg by mouth daily. 05/24/23   [provider]  fluticasone (FLONASE) 50 MCG/ACT nasal spray Place 1 spray into both nostrils daily. 04/19/23   [provider]  folic acid  (FOLVITE ) 1 MG tablet Take 1 tablet (1 mg total) by mouth daily. 01/23/20   Rojelio Nest, DO  hydrOXYzine  (VISTARIL ) 25 MG capsule Take 1 capsule (25 mg total) by mouth every 8 (eight) hours as needed for anxiety. 05/28/23   Evonnie Lenis, MD  levETIRAcetam  (KEPPRA ) 500 MG tablet Take 500 mg by mouth 2 (two) times daily. 05/24/23   [provider]  Multiple Vitamin (DAILY-VITE) TABS Take 1 tablet by mouth daily. 05/24/23   [provider]  nicotine  (NICODERM CQ  - DOSED IN MG/24 HOURS) 21 mg/24hr patch Place 21 mg onto the skin daily. 05/17/23   [provider]  Nutritional Supplements (ENSURE NUTRITION SHAKE) LIQD Take by mouth in the morning and at bedtime. 10/14/23   [provider]  thiamine  100 MG tablet Take 1 tablet (100 mg total) by mouth daily. 01/23/20   Rojelio Nest, DO  triamcinolone  cream (KENALOG ) 0.1 % Apply 1 Application topically 2 (two) times daily. 08/12/23   [provider]    Inpatient Medications: Scheduled Meds:  feeding supplement  237 mL Oral BID BM   furosemide   40 mg Intravenous Once   heparin   5,000 Units Subcutaneous Q8H   levETIRAcetam   500 mg Oral BID   Continuous Infusions:  cefTRIAXone  (ROCEPHIN )  IV     vancomycin      PRN Meds: acetaminophen  **OR** acetaminophen , albuterol , ondansetron  **OR** ondansetron  (ZOFRAN ) IV, polyethylene  glycol  Allergies:    Allergies  Allergen Reactions   Benadryl [Diphenhydramine Hcl (Sleep)] Itching and Rash    Social History:   Social History   Socioeconomic History   Marital status: Married    Spouse name: Not on file   Number of children: Not on file   Years of education: Not on file   Highest education level: Not on file  Occupational History   Not on file  Tobacco Use   Smoking status: Every Day    Current packs/day: 0.50    Types: Cigarettes   Smokeless tobacco: Never  Vaping Use   Vaping status: Never Used  Substance and Sexual Activity   Alcohol use: Not Currently    Comment: twice a week    Drug use: Not Currently    Types: Marijuana    Comment: last used 2 weeks ago   Sexual activity: Not on file  Other Topics Concern  Not on file  Social History Narrative   Not on file    Family History:    Family History  Problem Relation Age of Onset   Hypertension Mother    Hypertension Father      ROS:  Please see the history of present illness. All other ROS reviewed and negative.     Physical Exam/Data:   Vitals:   10/27/23 0141 10/27/23 0512 10/27/23 0930 10/27/23 0931  BP: 92/70 105/83 101/87   Pulse:   64   Resp: 20 14    Temp: 98 F (36.7 C) (!) 97.5 F (36.4 C) (!) 97.4 F (36.3 C)   TempSrc:   Axillary   SpO2: 100% 100% (!) 85% 93%  Weight:  70 kg    Height:        Intake/Output Summary (Last 24 hours) at 10/27/2023 1120 Last data filed at 10/27/2023 0500 Gross per 24 hour  Intake 720 ml  Output 1300 ml  Net -580 ml      10/27/2023    5:12 AM 10/26/2023   11:00 PM 10/26/2023    9:24 AM  Last 3 Weights  Weight (lbs) 154 lb 5.2 oz 154 lb 15.7 oz 154 lb 5.2 oz  Weight (kg) 70 kg 70.3 kg 70 kg     Body mass index is 22.79 kg/m.  General: Thin male appearing in no acute distress. HEENT: normal Neck: JVD difficult to assess as the patient is on his side and would not reposition during this encounter. Vascular: No carotid bruits;  Distal pulses 2+ bilaterally Cardiac:  normal S1, S2; RRR; no murmur  Lungs: Rhonchi throughout and rales along bases bilaterally. Abd: soft, nontender, no hepatomegaly  Ext: no pitting edema Musculoskeletal:  No deformities, BUE and BLE strength normal and equal Skin: warm and dry  Psych:  Awakens but quickly falls back asleep.   EKG:  The EKG was personally reviewed and demonstrates:  NSR, HR 74 with slight ST depression along the inferior leads and noted on prior tracings.  Telemetry:  Telemetry was personally reviewed and demonstrates: NSR, HR in 70's to 80's with occasional PVC's.   Relevant CV Studies:  Echocardiogram: 01/2020 IMPRESSIONS    1. Left ventricular ejection fraction, by estimation, is 65 to 70%. The  left ventricle has hyperdynamic function. The left ventricle has no  regional wall motion abnormalities. There is moderate concentric left  ventricular hypertrophy. Left ventricular  diastolic parameters are consistent with Grade I diastolic dysfunction  (impaired relaxation). Elevated left atrial pressure.   2. Right ventricular systolic function is normal. The right ventricular  size is normal.   3. Left atrial size was mildly dilated.   4. The mitral valve is normal in structure. Trivial mitral valve  regurgitation. No evidence of mitral stenosis.   5. The aortic valve is normal in structure. Aortic valve regurgitation is  not visualized. No aortic stenosis is present.   6. The inferior vena cava is normal in size with greater than 50%  respiratory variability, suggesting right atrial pressure of 3 mmHg.   Conclusion(s)/Recommendation(s): No intracardiac source of embolism  detected on this transthoracic study. A transesophageal echocardiogram is  recommended to exclude cardiac source of embolism if clinically indicated.   Laboratory Data:  High Sensitivity Troponin:   Recent Labs  Lab 10/26/23 0936 10/26/23 1959  TROPONINIHS 25* 16      Chemistry Recent Labs  Lab 10/26/23 0935 10/26/23 0936 10/27/23 0405  NA  --  137 135  K  --  4.4 4.0  CL  --  106 105  CO2  --  20* 19*  GLUCOSE  --  121* 130*  BUN  --  42* 51*  CREATININE  --  1.71* 1.73*  CALCIUM   --  9.1 8.6*  MG 2.0  --   --   GFRNONAA  --  46* 45*  ANIONGAP  --  11 11     Hematology Recent Labs  Lab 10/26/23 0936 10/27/23 0405  WBC 9.8 8.3  RBC 4.35 4.39  HGB 11.8* 12.0*  HCT 37.1* 37.0*  MCV 85.3 84.3  MCH 27.1 27.3  MCHC 31.8 32.4  RDW 15.9* 15.8*  PLT 281 302   BNP Recent Labs  Lab 10/26/23 1100  BNP 2,518.0*     Radiology/Studies:  CT Angio Chest PE W and/or Wo Contrast Result Date: 10/26/2023 CLINICAL DATA:  Shortness of breath. Clinical concern for pulmonary embolism. EXAM: CT ANGIOGRAPHY CHEST WITH CONTRAST TECHNIQUE: Multidetector CT imaging of the chest was performed using the standard protocol during bolus administration of intravenous contrast. Multiplanar CT image reconstructions and MIPs were obtained to evaluate the vascular anatomy. RADIATION DOSE REDUCTION: This exam was performed according to the departmental dose-optimization program which includes automated exposure control, adjustment of the mA and/or kV according to patient size and/or use of iterative reconstruction technique. CONTRAST:  75mL OMNIPAQUE  IOHEXOL  350 MG/ML SOLN COMPARISON:  Chest radiographs obtained earlier today. FINDINGS: Cardiovascular: Normally opacified pulmonary arteries with no pulmonary arterial filling defects seen. Enlarged heart moderate-sized pericardial effusion with a maximum thickness of 2.6 cm. Atheromatous calcifications, including the coronary arteries and aorta. Dilated central pulmonary arteries with a main pulmonary artery diameter of 3.8 cm. Mediastinum/Nodes: Enlarged mediastinal and bilateral hilar lymph nodes. These include a right inferior paratracheal node with a short axis diameter of 10 mm on image number 43/4, AP window lymph node  with a short axis diameter of 10 mm on image number 43/4, right hilar node with a short axis diameter of 17 mm on image number 51/4 and subcarinal node with a short axis diameter of 10 mm on image number 57/4. Borderline enlarged left axillary nodes, the largest measuring 8 mm in short axis diameter on image number 39/4 mildly enlarged gastrohepatic ligament lymph node with a short axis diameter of 10 mm on image number 105/4. Unremarkable thyroid gland, trachea and esophagus. Lungs/Pleura: Prominent pulmonary vasculature and mildly prominent interstitial markings, especially in the lower lung zones. Small amount of patchy density in the right lower lobe. No pleural fluid. Upper Abdomen: Mildly dilated inferior vena cava and hepatic veins with reflux of contrast from the right atrium into the hepatic veins Musculoskeletal: Mild dextroconvex thoracic scoliosis. Lower cervical spine degenerative changes. Review of the MIP images confirms the above findings. IMPRESSION: 1. No pulmonary embolism. 2. Cardiomegaly with a moderate-sized pericardial effusion, mild pulmonary vascular congestion and mild interstitial pulmonary edema, consistent with congestive heart failure. 3. Small amount of patchy density in the right lower lobe, which could represent atelectasis, pneumonia or focal alveolar edema. 4. Dilated central pulmonary arteries, consistent with pulmonary arterial hypertension. 5. Mild mediastinal, bilateral hilar, left axillary and gastrohepatic ligament lymphadenopathy. This is nonspecific. 6.  Calcific coronary artery and aortic atherosclerosis. Aortic Atherosclerosis (ICD10-I70.0). Electronically Signed   By: Elspeth Bathe M.D.   On: 10/26/2023 14:33   DG Chest 2 View Result Date: 10/26/2023 CLINICAL DATA:  Shortness of breath. EXAM: CHEST - 2 VIEW COMPARISON:  05/25/2023. FINDINGS: Redemonstration of linear  area of scarring/atelectasis in the right lung apex. Bilateral lung fields are otherwise clear. No acute  consolidation or lung collapse. No pulmonary edema. Bilateral costophrenic angles are clear. Mildly enlarged cardio-mediastinal silhouette, which is likely due to AP technique. No acute osseous abnormalities. The soft tissues are within normal limits. IMPRESSION: *No active cardiopulmonary disease. Electronically Signed   By: Ree Molt M.D.   On: 10/26/2023 11:39    Assessment and Plan:   1. Possible acute CHF Exacerbation (Echo pending to determine HFpEF vs. HFrEF)/Pericardial Effusion - Presented with worsening dyspnea and was severely hypoxic upon arrival and also hypothermic.  BNP elevated at 2518 and CTA showed cardiomegaly and a moderate size pericardial effusion and vascular congestion consistent with CHF. - He received IV Lasix  but developed worsening hypotension with this and additional diuretics currently held. As discussed above, he appears to be having some AMS and I have messaged the admitting team to check an ABG. Will also check a Lactic acid and LFT's at this time. Echocardiogram is pending but I am concerned that he has a cardiomyopathy. Pending labs and echocardiogram, he may require initiation of inotropic support for diuresis.  2. HTN - He has actually been hypotensive this admission. PTA Amlodipine , Losartan-HCTZ, Coreg  and Clonidine  currently held.  3.Stage 3 CKD - Baseline creatinine 1.2 - 1.4. Elevated at 1.7 on admission at 1.73 today.  4. Concerns for Bacteremia - One blood culture showed gram-positive cocci and ID panel overall negative. On Ceftriaxone  and Vancomycin  currently. Management per the admitting team.  5. AMS - He awakens but quickly falls back asleep. The officer with him reports this is atypical for him as he is usually conversational. Will recheck a lactic acid this morning. Also check a UDS (has been incarcerated since 05/2023). Will message the admitting team to make them aware to see if they want to obtain an ABG.  6. Hypothyroidism - On  Levothyroxine  prior to admission but not currently ordered. Will order Levothyroxine  100 mcg daily. Recheck TSH.   For questions or updates, please contact [facility] Please consult [email or web address] for contact info under    Signed, [name], PA-C  10/27/2023 11:20 AM   Attending note:  Patient seen and examined.  I reviewed available records and discussed the case with Ms. [name].  Mr. Feasel is currently admitted having presented to the [facility] from the [facility] jail where he has been incarcerated since September 2024.  Presented with shortness of breath, also noted to be hypothermic and hypoxic.  Provides somewhat limited history to me today, but states that he has not felt right, like he is drunk, short of breath and coughing, not eating as much, no chest pain, no swelling.  Chest CT obtained excluding pulmonary embolus although he did have dilated central pulmonary arteries suggesting the possibility of pulmonary hypertension.  Small patchy density in the right lower lobe does not exclude infiltrate.  He did have cardiomegaly and what was described as a moderate sized pericardial effusion as well.  Cardiac enzymes not diagnostic for ACS.  BNP elevated at 2518 and initial lactate 2.0 but decreased since then.  Viral respiratory panel also negative.  Blood cultures sent with initial results indicating potential contaminant.  He has been admitted to the hospitalist team for further management, placed on antibiotics for possibility of sepsis but etiology of his presentation is not entirely clear as yet based on available information.  On examination this morning he does answer questions  to limited degree, does not describe any chest pain and does not appear short of breath at rest.  Temperature 97.4 degrees axillary, heart rate in the 80s in sinus rhythm by telemetry, blood pressure 101/87.  ReDS clip measurement this morning was only 31.  Lungs exhibit  scattered rhonchi with no wheezing.  Cardiac exam with RRR, somewhat distant, no obvious gallop.  No peripheral edema.  Lab work includes potassium 4.0, BUN 51, creatinine 1.73, WBC 8.3, hemoglobin 12.0, platelets 302, urinalysis unremarkable.  ECG shows sinus rhythm with LVH, reports patient abnormalities, prolonged QT.  Unifying diagnosis for presentation is not entirely clear as yet.  Suggest echocardiogram to define biventricular function, pulmonary pressures if possible and also pericardial effusion.  Would also check UDS, ABG and TSH.  Hold off on diuresis for now.  He is being treated with empiric antibiotics.  Jayson JUDITHANN Sierras, M.D., F.A.C.C.

## 2023-10-27 NOTE — Progress Notes (Signed)
   Progress Note  Patient Name: Rick Williams Date of Encounter: 10/27/2023  Please see cardiology consultation note from earlier today.  I just reviewed his echocardiogram.  LVEF 65 to 70% with severe inferoposterior LVH and small chamber size, D-shaped ventricle associated with severe pulmonary hypertension, estimated RVSP 107 mmHg.  RV is severely dilated and heavily trabeculated with severely reduced contraction.  These findings are new in comparison to prior study.  He does have a moderate posterolateral pericardial effusion but no tamponade physiology.  Tricuspid regurgitation is moderate to severe but he has only trivial mitral regurgitation.  Concern would be for pulmonary arterial hypertension, but he will need further workup.  UDS negative, TSH pending.  Follow-up lactate 1.3.  Discussed with covering hospitalist, recommend transfer to [facility] so that the advanced heart failure team can assess for potential right heart catheterization and further workup.  For questions or updates, please contact [facility] Please consult [email or web address] for contact info under   Signed, Jayson Sierras, MD  10/27/2023, 5:20 PM

## 2023-10-27 NOTE — Progress Notes (Addendum)
 PROGRESS NOTE    Rick Williams  FMW:[id number] DOB: 10-11-1965 DOA: 10/26/2023 PCP: Patient, No Pcp Per   Brief Narrative:  58 y.o. male with medical history significant for congestive heart failure, COPD, hypertension presented with worsening shortness of breath.  On presentation, creatinine of 1.7, troponin of 25, BNP of 2518.  Chest x-ray without acute abnormality.  CTA chest negative for PE but showed  moderate-sized pericardial effusion, mild pulmonary vascular congestion and mild interstitial pulmonary edema consistent with CHF.  Mild patchy density right lower lobe-atelectasis pneumonia or focal alveolar edema.  Patient was started on started on IV Lasix  and antibiotics.  Cardiology has been consulted.  Assessment & Plan:   Acute on chronic diastolic heart failure Acute respiratory failure with hypoxia -Patient has history of CHF but is not on diuretics. -Presented with worsening shortness of breath and hypoxia with elevated BNP -CT chest showed findings of moderate size pericardial effusion along with pulmonary vascular congestion -Continue IV Lasix .  Strict input output.  Daily weights.  Fluid restriction.  Follow 2D echo.  Await cardiology evaluation. -Still requiring 2 L oxygen via nasal cannula.  Wean off as able  Gram-positive cocci bacteremia -May be a contaminant.  Currently on vancomycin .  Hypothermia -Initially hypothermic on presentation.  Currently improved.  Initially started on antibiotics to cover for pneumonia but procalcitonin is negative.  Follow cultures.  Doubt that patient has pneumonia.  AKI Acute metabolic acidosis -Baseline creatinine of 1.2-1.4.  Creatinine 1.71 on presentation.  Creatinine 1.73 today.  Possibly from volume overload.  Monitor.    Alcohol abuse -Monitor for signs of withdrawal.  Has been incarcerated since September 2024  Essential hypertension -Blood pressure on the lower side.  Continue Lasix .  Anemia of chronic disease -From  chronic illnesses.  Hemoglobin currently stable.  Monitor intermittently  Prolonged QTc -Avoid QTc prolonging agents.  Continue telemetry monitoring.  Replete potassium and magnesium    DVT prophylaxis: Heparin  subcutaneous Code Status: Full Family Communication: Public relations account executive at bedside Disposition Plan: Status is: Inpatient Remains inpatient appropriate because: Of severity of illness    Consultants: Cardiology  Procedures: None  Antimicrobials:  Anti-infectives (From admission, onward)    Start     Dose/Rate Route Frequency Ordered Stop   10/27/23 1100  cefTRIAXone  (ROCEPHIN ) 2 g in sodium chloride  0.9 % 100 mL IVPB        2 g 200 mL/hr over 30 Minutes Intravenous Every 24 hours 10/26/23 1917     10/26/23 1945  vancomycin  (VANCOREADY) IVPB 1500 mg/300 mL        1,500 mg 150 mL/hr over 120 Minutes Intravenous  Once 10/26/23 1940 10/26/23 2258   10/26/23 1942  vancomycin  variable dose per unstable renal function (pharmacist dosing)         Does not apply See admin instructions 10/26/23 1943     10/26/23 1930  cefTRIAXone  (ROCEPHIN ) 1 g in sodium chloride  0.9 % 100 mL IVPB       Note to Pharmacy: Totaling 2 g, for treatment of possible bacteremia.   1 g 200 mL/hr over 30 Minutes Intravenous  Once 10/26/23 1917 10/26/23 2005   10/26/23 1100  cefTRIAXone  (ROCEPHIN ) 1 g in sodium chloride  0.9 % 100 mL IVPB        1 g 200 mL/hr over 30 Minutes Intravenous  Once 10/26/23 1057 10/26/23 1255   10/26/23 1100  azithromycin  (ZITHROMAX ) 500 mg in sodium chloride  0.9 % 250 mL IVPB        500  mg 250 mL/hr over 60 Minutes Intravenous  Once 10/26/23 1057 10/26/23 1508        Subjective: Patient seen and examined at bedside.  Still complains of shortness of breath with exertion.  Denies any current chest pain, nausea, vomiting.  Objective: Vitals:   10/26/23 2030 10/26/23 2300 10/27/23 0141 10/27/23 0512  BP: 118/89 120/85 92/70 105/83  Pulse: 78 82    Resp: 15  20 14    Temp:  98 F (36.7 C) 98 F (36.7 C) (!) 97.5 F (36.4 C)  TempSrc:  Oral    SpO2: 95% 96% 100% 100%  Weight:  70.3 kg  70 kg  Height:        Intake/Output Summary (Last 24 hours) at 10/27/2023 0709 Last data filed at 10/27/2023 0500 Gross per 24 hour  Intake 720 ml  Output 1300 ml  Net -580 ml   Filed Weights   10/26/23 0924 10/26/23 2300 10/27/23 0512  Weight: 70 kg 70.3 kg 70 kg    Examination:  General exam: Appears calm and comfortable.  On 2 L oxygen via nasal cannula. Respiratory system: Bilateral decreased breath sounds at bases with scattered crackles Cardiovascular system: S1 & S2 heard, Rate controlled Gastrointestinal system: Abdomen is nondistended, soft and nontender. Normal bowel sounds heard. Extremities: No cyanosis, clubbing; bilateral lower extremity trace edema present Central nervous system: Sleepy, wakes up slightly, extremely slow to respond.  Poor historian.  No focal neurological deficits. Moving extremities Skin: No rashes, lesions or ulcers Psychiatry: Flat affect.  Not agitated.    Data Reviewed: I have personally reviewed following labs and imaging studies  CBC: Recent Labs  Lab 10/26/23 0936 10/27/23 0405  WBC 9.8 8.3  NEUTROABS 7.4  --   HGB 11.8* 12.0*  HCT 37.1* 37.0*  MCV 85.3 84.3  PLT 281 302   Basic Metabolic Panel: Recent Labs  Lab 10/26/23 0935 10/26/23 0936 10/27/23 0405  NA  --  137 135  K  --  4.4 4.0  CL  --  106 105  CO2  --  20* 19*  GLUCOSE  --  121* 130*  BUN  --  42* 51*  CREATININE  --  1.71* 1.73*  CALCIUM   --  9.1 8.6*  MG 2.0  --   --    GFR: Estimated Creatinine Clearance: 46.6 mL/min (A) (by C-G formula based on SCr of 1.73 mg/dL (H)). Liver Function Tests: No results for input(s): AST, ALT, ALKPHOS, BILITOT, PROT, ALBUMIN in the last 168 hours. No results for input(s): LIPASE, AMYLASE in the last 168 hours. No results for input(s): AMMONIA in the last 168 hours. Coagulation  Profile: No results for input(s): INR, PROTIME in the last 168 hours. Cardiac Enzymes: No results for input(s): CKTOTAL, CKMB, CKMBINDEX, TROPONINI in the last 168 hours. BNP (last 3 results) No results for input(s): PROBNP in the last 8760 hours. HbA1C: No results for input(s): HGBA1C in the last 72 hours. CBG: No results for input(s): GLUCAP in the last 168 hours. Lipid Profile: No results for input(s): CHOL, HDL, LDLCALC, TRIG, CHOLHDL, LDLDIRECT in the last 72 hours. Thyroid Function Tests: No results for input(s): TSH, T4TOTAL, FREET4, T3FREE, THYROIDAB in the last 72 hours. Anemia Panel: No results for input(s): VITAMINB12, FOLATE, FERRITIN, TIBC, IRON, RETICCTPCT in the last 72 hours. Sepsis Labs: Recent Labs  Lab 10/26/23 0936 10/26/23 1035 10/26/23 1159  PROCALCITON <0.10  --   --   LATICACIDVEN  --  2.0* 1.3    Recent  Results (from the past 240 hours)  Resp panel by RT-PCR (RSV, Flu A&B, Covid) Anterior Nasal Swab     Status: None   Collection Time: 10/26/23  9:56 AM   Specimen: Anterior Nasal Swab  Result Value Ref Range Status   SARS Coronavirus 2 by RT PCR NEGATIVE NEGATIVE Final    Comment: (NOTE) SARS-CoV-2 target nucleic acids are NOT DETECTED.  The SARS-CoV-2 RNA is generally detectable in upper respiratory specimens during the acute phase of infection. The lowest concentration of SARS-CoV-2 viral copies this assay can detect is 138 copies/mL. A negative result does not preclude SARS-Cov-2 infection and should not be used as the sole basis for treatment or other patient management decisions. A negative result may occur with  improper specimen collection/handling, submission of specimen other than nasopharyngeal swab, presence of viral mutation(s) within the areas targeted by this assay, and inadequate number of viral copies(<138 copies/mL). A negative result must be combined with clinical observations,  patient history, and epidemiological information. The expected result is Negative.  Fact Sheet for Patients:  bloggercourse.com  Fact Sheet for Healthcare Providers:  seriousbroker.it  This test is no t yet approved or cleared by the [place]  FDA and  has been authorized for detection and/or diagnosis of SARS-CoV-2 by FDA under an Emergency Use Authorization (EUA). This EUA will remain  in effect (meaning this test can be used) for the duration of the COVID-19 declaration under Section 564(b)(1) of the Act, 21 U.S.C.section 360bbb-3(b)(1), unless the authorization is terminated  or revoked sooner.       Influenza A by PCR NEGATIVE NEGATIVE Final   Influenza B by PCR NEGATIVE NEGATIVE Final    Comment: (NOTE) The Xpert Xpress SARS-CoV-2/FLU/RSV plus assay is intended as an aid in the diagnosis of influenza from Nasopharyngeal swab specimens and should not be used as a sole basis for treatment. Nasal washings and aspirates are unacceptable for Xpert Xpress SARS-CoV-2/FLU/RSV testing.  Fact Sheet for Patients: bloggercourse.com  Fact Sheet for Healthcare Providers: seriousbroker.it  This test is not yet approved or cleared by the [place]  FDA and has been authorized for detection and/or diagnosis of SARS-CoV-2 by FDA under an Emergency Use Authorization (EUA). This EUA will remain in effect (meaning this test can be used) for the duration of the COVID-19 declaration under Section 564(b)(1) of the Act, 21 U.S.C. section 360bbb-3(b)(1), unless the authorization is terminated or revoked.     Resp Syncytial Virus by PCR NEGATIVE NEGATIVE Final    Comment: (NOTE) Fact Sheet for Patients: bloggercourse.com  Fact Sheet for Healthcare Providers: seriousbroker.it  This test is not yet approved or cleared by the [place]  [place] FDA and has been authorized for detection and/or diagnosis of SARS-CoV-2 by FDA under an Emergency Use Authorization (EUA). This EUA will remain in effect (meaning this test can be used) for the duration of the COVID-19 declaration under Section 564(b)(1) of the Act, 21 U.S.C. section 360bbb-3(b)(1), unless the authorization is terminated or revoked.  Performed at [facility], [address], [place], [place] [address]   Blood culture (routine x 2)     Status: None (Preliminary result)   Collection Time: 10/26/23 10:35 AM   Specimen: BLOOD  Result Value Ref Range Status   Specimen Description   Final    BLOOD BLOOD LEFT ARM Performed at [facility], [address], [place], [place] [address]    Special Requests   Final    BOTTLES DRAWN AEROBIC AND ANAEROBIC Blood Culture adequate  volume Performed at [facility], [address], [place], [place] [address]    Culture  Setup Time   Final    GRAM POSITIVE COCCI AEROBIC BOTTLE ONLY Gram Stain Report Called to,Read Back By and Verified With: DELENA LINE RN [phone] K FORSYTH    Culture   Final    CULTURE REINCUBATED FOR BETTER GROWTH Performed at [facility], [address], [place], [place] [address]    Report Status PENDING  Incomplete  Blood culture (routine x 2)     Status: None (Preliminary result)   Collection Time: 10/26/23 10:35 AM   Specimen: BLOOD  Result Value Ref Range Status   Specimen Description BLOOD BLOOD RIGHT ARM AEROBIC BOTTLE ONLY  Final   Special Requests   Final    Blood Culture adequate volume BOTTLES DRAWN AEROBIC ONLY   Culture   Final    NO GROWTH < 24 HOURS Performed at [facility], [address], [place], [place] [address]    Report Status PENDING  Incomplete  Blood Culture ID Panel (Reflexed)     Status: None   Collection Time: 10/26/23 10:35 AM  Result Value Ref Range Status   Enterococcus faecalis NOT DETECTED NOT DETECTED Final   Enterococcus Faecium NOT DETECTED NOT DETECTED  Final   Listeria monocytogenes NOT DETECTED NOT DETECTED Final   Staphylococcus species NOT DETECTED NOT DETECTED Final   Staphylococcus aureus (BCID) NOT DETECTED NOT DETECTED Final   Staphylococcus epidermidis NOT DETECTED NOT DETECTED Final   Staphylococcus lugdunensis NOT DETECTED NOT DETECTED Final   Streptococcus species NOT DETECTED NOT DETECTED Final   Streptococcus agalactiae NOT DETECTED NOT DETECTED Final   Streptococcus pneumoniae NOT DETECTED NOT DETECTED Final   Streptococcus pyogenes NOT DETECTED NOT DETECTED Final   A.calcoaceticus-baumannii NOT DETECTED NOT DETECTED Final   Bacteroides fragilis NOT DETECTED NOT DETECTED Final   Enterobacterales NOT DETECTED NOT DETECTED Final   Enterobacter cloacae complex NOT DETECTED NOT DETECTED Final   Escherichia coli NOT DETECTED NOT DETECTED Final   Klebsiella aerogenes NOT DETECTED NOT DETECTED Final   Klebsiella oxytoca NOT DETECTED NOT DETECTED Final   Klebsiella pneumoniae NOT DETECTED NOT DETECTED Final   Proteus species NOT DETECTED NOT DETECTED Final   Salmonella species NOT DETECTED NOT DETECTED Final   Serratia marcescens NOT DETECTED NOT DETECTED Final   Haemophilus influenzae NOT DETECTED NOT DETECTED Final   Neisseria meningitidis NOT DETECTED NOT DETECTED Final   Pseudomonas aeruginosa NOT DETECTED NOT DETECTED Final   Stenotrophomonas maltophilia NOT DETECTED NOT DETECTED Final   Candida albicans NOT DETECTED NOT DETECTED Final   Candida auris NOT DETECTED NOT DETECTED Final   Candida glabrata NOT DETECTED NOT DETECTED Final   Candida krusei NOT DETECTED NOT DETECTED Final   Candida parapsilosis NOT DETECTED NOT DETECTED Final   Candida tropicalis NOT DETECTED NOT DETECTED Final   Cryptococcus neoformans/gattii NOT DETECTED NOT DETECTED Final    Comment: Performed at [facility], [address] [place], [place] [address]         Radiology Studies: CT Angio Chest PE W and/or Wo Contrast Result  Date: 10/26/2023 CLINICAL DATA:  Shortness of breath. Clinical concern for pulmonary embolism. EXAM: CT ANGIOGRAPHY CHEST WITH CONTRAST TECHNIQUE: Multidetector CT imaging of the chest was performed using the standard protocol during bolus administration of intravenous contrast. Multiplanar CT image reconstructions and MIPs were obtained to evaluate the vascular anatomy. RADIATION DOSE REDUCTION: This exam was performed according to the  departmental dose-optimization program which includes automated exposure control, adjustment of the mA and/or kV according to patient size and/or use of iterative reconstruction technique. CONTRAST:  75mL OMNIPAQUE  IOHEXOL  350 MG/ML SOLN COMPARISON:  Chest radiographs obtained earlier today. FINDINGS: Cardiovascular: Normally opacified pulmonary arteries with no pulmonary arterial filling defects seen. Enlarged heart moderate-sized pericardial effusion with a maximum thickness of 2.6 cm. Atheromatous calcifications, including the coronary arteries and aorta. Dilated central pulmonary arteries with a main pulmonary artery diameter of 3.8 cm. Mediastinum/Nodes: Enlarged mediastinal and bilateral hilar lymph nodes. These include a right inferior paratracheal node with a short axis diameter of 10 mm on image number 43/4, AP window lymph node with a short axis diameter of 10 mm on image number 43/4, right hilar node with a short axis diameter of 17 mm on image number 51/4 and subcarinal node with a short axis diameter of 10 mm on image number 57/4. Borderline enlarged left axillary nodes, the largest measuring 8 mm in short axis diameter on image number 39/4 mildly enlarged gastrohepatic ligament lymph node with a short axis diameter of 10 mm on image number 105/4. Unremarkable thyroid gland, trachea and esophagus. Lungs/Pleura: Prominent pulmonary vasculature and mildly prominent interstitial markings, especially in the lower lung zones. Small amount of patchy density in the right lower  lobe. No pleural fluid. Upper Abdomen: Mildly dilated inferior vena cava and hepatic veins with reflux of contrast from the right atrium into the hepatic veins Musculoskeletal: Mild dextroconvex thoracic scoliosis. Lower cervical spine degenerative changes. Review of the MIP images confirms the above findings. IMPRESSION: 1. No pulmonary embolism. 2. Cardiomegaly with a moderate-sized pericardial effusion, mild pulmonary vascular congestion and mild interstitial pulmonary edema, consistent with congestive heart failure. 3. Small amount of patchy density in the right lower lobe, which could represent atelectasis, pneumonia or focal alveolar edema. 4. Dilated central pulmonary arteries, consistent with pulmonary arterial hypertension. 5. Mild mediastinal, bilateral hilar, left axillary and gastrohepatic ligament lymphadenopathy. This is nonspecific. 6.  Calcific coronary artery and aortic atherosclerosis. Aortic Atherosclerosis (ICD10-I70.0). Electronically Signed   By: Elspeth Bathe M.D.   On: 10/26/2023 14:33   DG Chest 2 View Result Date: 10/26/2023 CLINICAL DATA:  Shortness of breath. EXAM: CHEST - 2 VIEW COMPARISON:  05/25/2023. FINDINGS: Redemonstration of linear area of scarring/atelectasis in the right lung apex. Bilateral lung fields are otherwise clear. No acute consolidation or lung collapse. No pulmonary edema. Bilateral costophrenic angles are clear. Mildly enlarged cardio-mediastinal silhouette, which is likely due to AP technique. No acute osseous abnormalities. The soft tissues are within normal limits. IMPRESSION: *No active cardiopulmonary disease. Electronically Signed   By: Ree Molt M.D.   On: 10/26/2023 11:39        Scheduled Meds:  feeding supplement  237 mL Oral BID BM   furosemide   40 mg Intravenous Once   heparin   5,000 Units Subcutaneous Q8H   levETIRAcetam   500 mg Oral BID   vancomycin  variable dose per unstable renal function (pharmacist dosing)   Does not apply See admin  instructions   Continuous Infusions:  cefTRIAXone  (ROCEPHIN )  IV            Sophie Mao, MD [facility] 10/27/2023, 7:09 AM

## 2023-10-27 NOTE — Progress Notes (Signed)
 Nurse at bedside this am,patient arousable,alert to person,place,confused to time.Blood pressure 101/87,and heart rate 64,Dr Alekh notified,holding the lasix  this am. Oxygen saturation was 85 percent on room air.Nasal canula applied at 2 liters,patient's oxygen saturation came up to 93 percent.Patient was very pleasant,assisted him with sitting up to eat breakfast.Plan of care on going.

## 2023-10-27 NOTE — Progress Notes (Signed)
 Mobility Specialist Progress Note:    10/27/23 1040  Mobility  Activity Dangled on edge of bed  Level of Assistance Independent after set-up  Assistive Device None  Range of Motion/Exercises Active;All extremities  Activity Response Tolerated well  Mobility Referral Yes  Mobility visit 1 Mobility  Mobility Specialist Start Time (ACUTE ONLY) 1040  Mobility Specialist Stop Time (ACUTE ONLY) 1100  Mobility Specialist Time Calculation (min) (ACUTE ONLY) 20 min   Pt received in bed, agreeable to mobility. Independently able to dangle EOB. Tolerated well, deferred further mobility d/t body temp and no SpO2 reading. Left pt sitting EOB, all needs met.   [name] Mobility Specialist Please contact via Special Educational Needs Teacher or  Rehab office at [phone]

## 2023-10-27 NOTE — TOC CM/SW Note (Signed)
 Transition of Care [facility]) - Inpatient Brief Assessment   Patient Details  Name: Rick Williams MRN: [id number] Date of Birth: September 12, 1966  Transition of Care [facility]) CM/SW Contact:    Noreen KATHEE Pinal, LCSWA Phone Number: 10/27/2023, 1:32 PM   Clinical Narrative:  Transition of Care Department [facility]) has reviewed patient and no TOC needs have been identified at this time. We will continue to monitor patient advancement through interdisciplinary progression rounds. If new patient transition needs arise, please place a TOC consult.   Transition of Care Asessment: Insurance and Status: Insurance coverage has been reviewed ([employer] / [employer]) Patient has primary care physician: No Home environment has been reviewed: [facility] Prior level of function:: Independent Prior/Current Home Services: No current home services Social Drivers of Health Review: SDOH reviewed no interventions necessary Readmission risk has been reviewed: Yes Transition of care needs: no transition of care needs at this time

## 2023-10-27 NOTE — Progress Notes (Signed)
*  PRELIMINARY RESULTS* Echocardiogram 2D Echocardiogram has been performed.  Bernis Brisker 10/27/2023, 5:03 PM

## 2023-10-28 ENCOUNTER — Other Ambulatory Visit: Payer: Self-pay

## 2023-10-28 ENCOUNTER — Encounter (HOSPITAL_COMMUNITY): Admission: EM | Payer: Self-pay | Attending: Internal Medicine

## 2023-10-28 DIAGNOSIS — I5031 Acute diastolic (congestive) heart failure: Secondary | ICD-10-CM | POA: Diagnosis not present

## 2023-10-28 DIAGNOSIS — I272 Pulmonary hypertension, unspecified: Secondary | ICD-10-CM

## 2023-10-28 HISTORY — PX: RIGHT HEART CATH: CATH118263

## 2023-10-28 LAB — BASIC METABOLIC PANEL
Anion gap: 10 (ref 5–15)
BUN: 47 mg/dL — ABNORMAL HIGH (ref 6–20)
CO2: 18 mmol/L — ABNORMAL LOW (ref 22–32)
Calcium: 8.2 mg/dL — ABNORMAL LOW (ref 8.9–10.3)
Chloride: 105 mmol/L (ref 98–111)
Creatinine, Ser: 1.58 mg/dL — ABNORMAL HIGH (ref 0.61–1.24)
GFR, Estimated: 51 mL/min — ABNORMAL LOW (ref >60)
Glucose, Bld: 146 mg/dL — ABNORMAL HIGH (ref 70–99)
Potassium: 3.9 mmol/L (ref 3.5–5.1)
Sodium: 133 mmol/L — ABNORMAL LOW (ref 135–145)

## 2023-10-28 LAB — POCT I-STAT EG7
Acid-base deficit: 1 mmol/L (ref 0.0–2.0)
Acid-base deficit: 1 mmol/L (ref 0.0–2.0)
Bicarbonate: 24.2 mmol/L (ref 20.0–28.0)
Bicarbonate: 24.3 mmol/L (ref 20.0–28.0)
Calcium, Ion: 1.15 mmol/L (ref 1.15–1.40)
Calcium, Ion: 1.2 mmol/L (ref 1.15–1.40)
HCT: 36 % — ABNORMAL LOW (ref 39.0–52.0)
HCT: 36 % — ABNORMAL LOW (ref 39.0–52.0)
Hemoglobin: 12.2 g/dL — ABNORMAL LOW (ref 13.0–17.0)
Hemoglobin: 12.2 g/dL — ABNORMAL LOW (ref 13.0–17.0)
O2 Saturation: 29 %
O2 Saturation: 30 %
Potassium: 4.2 mmol/L (ref 3.5–5.1)
Potassium: 4.2 mmol/L (ref 3.5–5.1)
Sodium: 141 mmol/L (ref 135–145)
Sodium: 141 mmol/L (ref 135–145)
TCO2: 25 mmol/L (ref 22–32)
TCO2: 26 mmol/L (ref 22–32)
pCO2, Ven: 40.3 mm[Hg] — ABNORMAL LOW (ref 44–60)
pCO2, Ven: 40.9 mm[Hg] — ABNORMAL LOW (ref 44–60)
pH, Ven: 7.38 (ref 7.25–7.43)
pH, Ven: 7.388 (ref 7.25–7.43)
pO2, Ven: 19 mm[Hg] — CL (ref 32–45)
pO2, Ven: 19 mm[Hg] — CL (ref 32–45)

## 2023-10-28 LAB — MAGNESIUM: Magnesium: 1.9 mg/dL (ref 1.7–2.4)

## 2023-10-28 LAB — CULTURE, BLOOD (ROUTINE X 2)
Culture  Setup Time: NONE SEEN
Culture: NO GROWTH
Special Requests: ADEQUATE

## 2023-10-28 LAB — TSH: TSH: 1.408 u[IU]/mL (ref 0.350–4.500)

## 2023-10-28 SURGERY — RIGHT HEART CATH
Anesthesia: LOCAL

## 2023-10-28 MED ORDER — LIDOCAINE HCL (PF) 1 % IJ SOLN
INTRAMUSCULAR | Status: DC | PRN
  Administered 2023-10-28: 5 mL

## 2023-10-28 MED ORDER — FUROSEMIDE 10 MG/ML IJ SOLN
80.0000 mg | Freq: Two times a day (BID) | INTRAMUSCULAR | Status: DC
  Administered 2023-10-28 – 2023-10-30 (×4): 80 mg via INTRAVENOUS
  Filled 2023-10-28 (×4): qty 8

## 2023-10-28 MED ORDER — MIDODRINE HCL 5 MG PO TABS
5.0000 mg | ORAL_TABLET | Freq: Three times a day (TID) | ORAL | Status: DC
  Administered 2023-10-29 – 2023-10-30 (×5): 5 mg via ORAL
  Filled 2023-10-28 (×5): qty 1

## 2023-10-28 MED ORDER — LIDOCAINE HCL (PF) 1 % IJ SOLN
INTRAMUSCULAR | Status: AC
  Filled 2023-10-28: qty 30

## 2023-10-28 MED ORDER — POTASSIUM CHLORIDE CRYS ER 20 MEQ PO TBCR
40.0000 meq | EXTENDED_RELEASE_TABLET | Freq: Once | ORAL | Status: AC
  Administered 2023-10-28: 40 meq via ORAL
  Filled 2023-10-28: qty 2

## 2023-10-28 MED ORDER — DIGOXIN 125 MCG PO TABS
0.1250 mg | ORAL_TABLET | Freq: Every day | ORAL | Status: DC
  Administered 2023-10-28 – 2023-11-02 (×6): 0.125 mg via ORAL
  Filled 2023-10-28 (×6): qty 1

## 2023-10-28 MED ORDER — MILRINONE LACTATE IN DEXTROSE 20-5 MG/100ML-% IV SOLN
0.1250 ug/kg/min | INTRAVENOUS | Status: DC
  Administered 2023-10-28 – 2023-10-30 (×2): 0.125 ug/kg/min via INTRAVENOUS
  Filled 2023-10-28 (×2): qty 100

## 2023-10-28 MED ORDER — FENTANYL CITRATE (PF) 100 MCG/2ML IJ SOLN
INTRAMUSCULAR | Status: DC | PRN
  Administered 2023-10-28: 25 ug via INTRAVENOUS

## 2023-10-28 MED ORDER — SODIUM CHLORIDE 0.9 % IV SOLN: INTRAVENOUS | Status: DC

## 2023-10-28 MED ORDER — MAGNESIUM SULFATE 2 GM/50ML IV SOLN
2.0000 g | Freq: Once | INTRAVENOUS | Status: AC
  Administered 2023-10-28: 2 g via INTRAVENOUS
  Filled 2023-10-28: qty 50

## 2023-10-28 MED ORDER — SILDENAFIL CITRATE 20 MG PO TABS
10.0000 mg | ORAL_TABLET | Freq: Three times a day (TID) | ORAL | Status: DC
  Administered 2023-10-28 – 2023-10-29 (×3): 10 mg via ORAL
  Filled 2023-10-28 (×5): qty 1

## 2023-10-28 MED ORDER — FENTANYL CITRATE (PF) 100 MCG/2ML IJ SOLN
INTRAMUSCULAR | Status: AC
  Filled 2023-10-28: qty 2

## 2023-10-28 MED ORDER — HEPARIN (PORCINE) IN NACL 1000-0.9 UT/500ML-% IV SOLN
INTRAVENOUS | Status: DC | PRN
  Administered 2023-10-28: 500 mL

## 2023-10-28 SURGICAL SUPPLY — 8 items
CATH SWAN GANZ 7F STRAIGHT (CATHETERS) IMPLANT
PACK CARDIAC CATHETERIZATION (CUSTOM PROCEDURE TRAY) ×2 IMPLANT
SHEATH PINNACLE 7F 10CM (SHEATH) IMPLANT
SHEATH PROBE COVER 6X72 (BAG) IMPLANT
TRANSDUCER W/STOPCOCK (MISCELLANEOUS) IMPLANT
TUBING ART PRESS 72 MALE/FEM (TUBING) IMPLANT
WIRE MICRO SET SILHO 5FR 7 (SHEATH) IMPLANT
WIRE MICROINTRODUCER 60CM (WIRE) IMPLANT

## 2023-10-28 NOTE — Progress Notes (Signed)
 Order for PICC noted. Discussed risks, benefits and alternatives with patient. Requested waiting until 2-8 AM for placement. Primary RN notified.

## 2023-10-28 NOTE — Progress Notes (Addendum)
 Patient Name: Rick Williams Date of Encounter: 10/28/2023 [facility] Cardiologist: Jayson Sierras, MD (New)  Interval Summary  .    Reviewed echo findings with the patient and need for transfer to [facility] and evaluation by the Advanced Heart Failure team. In agreement with this and officer at the bedside updated as well about pending transfer once a bed becomes available.   He reports his breathing has improved. No chest pain or palpitations. He was a heavy smoker and drinker but quit smoking cigarettes and consuming alcohol over a year ago. When talking about prior lung issues, says he was diagnosed with asthma as a child. Also had a chest tube placed 20+ years ago but no issues following this.   Vital Signs .    Vitals:   10/27/23 1629 10/27/23 2200 10/28/23 0334 10/28/23 0500  BP: 100/73 (!) 127/106 (!) 121/97   Pulse: 88 96 98   Resp:   16   Temp:  98.1 F (36.7 C) 97.8 F (36.6 C)   TempSrc:  Oral    SpO2:  96% 91%   Weight:    57.5 kg  Height:        Intake/Output Summary (Last 24 hours) at 10/28/2023 1009 Last data filed at 10/28/2023 0800 Gross per 24 hour  Intake 640 ml  Output 1000 ml  Net -360 ml      10/28/2023    5:00 AM 10/27/2023    5:12 AM 10/26/2023   11:00 PM  Last 3 Weights  Weight (lbs) 126 lb 12.2 oz 154 lb 5.2 oz 154 lb 15.7 oz  Weight (kg) 57.5 kg 70 kg 70.3 kg      Telemetry/ECG/Cardiac Studies:    Telemetry: NSR, HR in 90's to low 100's. Frequent PVC's and occasional episodes of ventricular bigeminy.  - Personally Reviewed  Echocardiogram: 10/27/2023 IMPRESSIONS    1. Left ventricular ejection fraction, by estimation, is 65 to 70%. The  left ventricle has normal function. The left ventricle has no regional  wall motion abnormalities. There is severe asymmetric left ventricular  hypertrophy of the inferior and lateral   segments. Left ventricular diastolic parameters are consistent with Grade  I diastolic dysfunction  (impaired relaxation). There is the  interventricular septum is flattened in systole and diastole, consistent  with right ventricular pressure and volume  overload.   2. Right ventricular systolic function is severely reduced. The right  ventricular size is severely enlarged and heavily trabeculated. Mildly  increased right ventricular wall thickness. There is severely elevated  pulmonary artery systolic pressure. The  estimated right ventricular systolic pressure is 107.9 mmHg.   3. Left atrial size was upper normal.   4. Right atrial size was severely dilated.   5. Moderate pericardial effusion. The pericardial effusion is posterior  and lateral to the left ventricle. There is no evidence of cardiac  tamponade.   6. The tricuspid valve is abnormal. Tricuspid valve regurgitation is  moderate to severe.   7. The aortic valve is tricuspid. Aortic valve regurgitation is not  visualized.   8. The inferior vena cava is dilated in size with <50% respiratory  variability, suggesting right atrial pressure of 15 mmHg.   9. The mitral valve is grossly normal. Trivial mitral valve  regurgitation.   Comparison(s): Prior images reviewed side by side. RV dysfunction and  severe pulmonary hypertension are new in comparison.    Physical Exam .    GEN: Appears in no acute distress.  Neck: JVD at 10-12 cm.  Cardiac: RRR, no murmurs, rubs, or gallops.  Respiratory: Scattered rhonchi. No wheezing or rales.  GI: Soft, nontender, non-distended  MS: No pitting edema  Assessment & Plan .     1. Pulmonary HTN/RV Failure - Presented with worsening dyspnea and BNP elevated at 2518. LA at 2.0 on admission and down-trending to 1.3 on repeat checks. UDS negative. Repeat echo obtained yesterday and shows a preserved EF of 65-70% but noted to have severely reduced RV function and PASP severely elevated at 107.9 mmHg. Reviewed with Dr. Debera and recommendations are to transfer to [facility] for evaluation  by Advanced Heart Failure and likely [facility]. Card Master and Advanced Heart Failure team aware.   2. Pericardial Effusion - Moderate by echo this admission with no evidence of tamponade. Continue to follow.   3. HTN - BP has been low-normal, at 100/73-127/106 within the past 24 hours, at 121/97 on most recent check. PTA Amlodipine , Coreg  and Clonidine  currently held.   4. Stage 3 CKD - Baseline creatinine 1.2 - 1.4. Peaked at 1.73 this admission, at 1.58 today.   5. Hypothyroidism - TSH 1.408 this admission. Remains on Synthroid  100 mcg daily.   For questions or updates, please contact [facility] Please consult [email or web address] for contact info under        Signed, [name], PA-C    Attending note:  Patient seen and examined.  Case discussed with the [name], agree with her above findings.  Echocardiogram report reviewed yesterday with hospitalist team.  He has evidence of severe RV dysfunction and marked pulmonary hypertension with estimated RVSP approximately 108 mmHg, RV dysfunction new in comparison to prior echocardiogram from 2021.  Chest CTA during this admission negative for pulmonary embolus, pulmonary arterial hypertension is most suspected although he has not been worked up at all so far.  UDS negative, TSH 1.408.  Does have prior tobacco use history but reportedly none in the last year.  Presently incarcerated as discussed in the consultation note.  He is more alert today and interactive, afebrile, heart rate in the 80s to 90s in sinus rhythm, blood pressure 121/97, oxygen saturation high 90s on supplemental oxygen via nasal cannula.  Lungs without wheezing.  Cardiac exam reveals RRR, soft apical systolic murmur.  No pitting edema.  Pertinent lab work includes potassium 3.9, BUN 47, creatinine 1.58, AST 23, ALT 15, hemoglobin 12.0.  Plan is transfer to the hospitalist team at [facility] so that the advanced heart failure team can be involved in his  workup.  Anticipate right heart catheterization as first step.  [name], M.D., F.A.C.C.

## 2023-10-28 NOTE — Progress Notes (Signed)
 Was alert this evening and asking for food and drink.  No/co pain or shortness of breath.  On 2 liters and O2 sat in mid 90's

## 2023-10-28 NOTE — Progress Notes (Signed)
 PROGRESS NOTE    Rick Williams  FMW:[id number] DOB: 09/02/1966 DOA: 10/26/2023 PCP: Patient, No Pcp Per   Brief Narrative:  58 y.o. male with medical history significant for congestive heart failure, COPD, hypertension presented with worsening shortness of breath.  On presentation, creatinine of 1.7, troponin of 25, BNP of 2518.  Chest x-ray without acute abnormality.  CTA chest negative for PE but showed  moderate-sized pericardial effusion, mild pulmonary vascular congestion and mild interstitial pulmonary edema consistent with CHF.  Mild patchy density right lower lobe-atelectasis pneumonia or focal alveolar edema.  Patient was started on started on IV Lasix  and antibiotics.  Cardiology has been consulted.  Assessment & Plan:   Acute on chronic diastolic heart failure Acute respiratory failure with hypoxia Severe pulmonary hypertension Moderate pericardial effusion Moderate to severe tricuspid regurgitation -Patient has history of CHF but is not on diuretics. -Presented with worsening shortness of breath and hypoxia with elevated BNP -CT chest showed findings of moderate size pericardial effusion along with pulmonary vascular congestion -Strict input output.  Daily weights.  Fluid restriction.  -2D echo showed EF of 65 to 70% with severe pulmonary hypertension and moderate pericardial effusion with no tamponade with moderate to severe tricuspid regurgitation.  Cardiology following and recommending transfer to [facility] for further evaluation by advanced heart failure team.  Transfer currently pending.  Additional diuretics as per cardiology recommendations. -Still requiring 2 L oxygen via nasal cannula.  Wean off as able  Gram-positive cocci bacteremia -May be a contaminant.  Currently on vancomycin .  Follow repeat blood cultures from today.  Hypothermia -Initially hypothermic on presentation.  Currently improved.  Initially started on antibiotics to cover for pneumonia  but procalcitonin was negative.  Follow cultures.  Doubt that patient has pneumonia.  AKI Acute metabolic acidosis -Baseline creatinine of 1.2-1.4.  Creatinine 1.71 on presentation.  Creatinine 1.58 today.  Possibly from volume overload.  Monitor.    Possible COPD with exacerbation -Having intermittent wheezing with dyspnea and hypoxia.  No formal diagnosis of COPD known. -Continue Solu-Medrol  along with current nebs.  Hyponatremia -Mild.  Monitor  Alcohol abuse -Monitor for signs of withdrawal.  Has been incarcerated since September 2024  Essential hypertension -Monitor blood pressure.  Anemia of chronic disease -From chronic illnesses.  Hemoglobin currently stable.  Monitor intermittently  Prolonged QTc -Avoid QTc prolonging agents.  Continue telemetry monitoring.  Replete potassium and magnesium    DVT prophylaxis: Heparin  subcutaneous Code Status: Full Family Communication: Public relations account executive at bedside Disposition Plan: Status is: Inpatient Remains inpatient appropriate because: Of severity of illness    Consultants: Cardiology  Procedures: 2D echo  Antimicrobials:  Anti-infectives (From admission, onward)    Start     Dose/Rate Route Frequency Ordered Stop   10/27/23 2000  vancomycin  (VANCOCIN ) IVPB 1000 mg/200 mL premix        1,000 mg 200 mL/hr over 60 Minutes Intravenous Every 24 hours 10/27/23 0902     10/27/23 1100  cefTRIAXone  (ROCEPHIN ) 2 g in sodium chloride  0.9 % 100 mL IVPB  Status:  Discontinued        2 g 200 mL/hr over 30 Minutes Intravenous Every 24 hours 10/26/23 1917 10/27/23 1400   10/26/23 1945  vancomycin  (VANCOREADY) IVPB 1500 mg/300 mL        1,500 mg 150 mL/hr over 120 Minutes Intravenous  Once 10/26/23 1940 10/26/23 2258   10/26/23 1942  vancomycin  variable dose per unstable renal function (pharmacist dosing)  Status:  Discontinued  Does not apply See admin instructions 10/26/23 1943 10/27/23 0902   10/26/23 1930  cefTRIAXone   (ROCEPHIN ) 1 g in sodium chloride  0.9 % 100 mL IVPB       Note to Pharmacy: Totaling 2 g, for treatment of possible bacteremia.   1 g 200 mL/hr over 30 Minutes Intravenous  Once 10/26/23 1917 10/26/23 2005   10/26/23 1100  cefTRIAXone  (ROCEPHIN ) 1 g in sodium chloride  0.9 % 100 mL IVPB        1 g 200 mL/hr over 30 Minutes Intravenous  Once 10/26/23 1057 10/26/23 1255   10/26/23 1100  azithromycin  (ZITHROMAX ) 500 mg in sodium chloride  0.9 % 250 mL IVPB        500 mg 250 mL/hr over 60 Minutes Intravenous  Once 10/26/23 1057 10/26/23 1508        Subjective: Patient seen and examined at bedside.  No fever, vomiting, chest pain reported.  Still short of breath with exertion.  Objective: Vitals:   10/27/23 1629 10/27/23 2200 10/28/23 0334 10/28/23 0500  BP: 100/73 (!) 127/106 (!) 121/97   Pulse: 88 96 98   Resp:   16   Temp:  98.1 F (36.7 C) 97.8 F (36.6 C)   TempSrc:  Oral    SpO2:  96% 91%   Weight:    57.5 kg  Height:        Intake/Output Summary (Last 24 hours) at 10/28/2023 0706 Last data filed at 10/28/2023 0500 Gross per 24 hour  Intake 340 ml  Output 1000 ml  Net -660 ml   Filed Weights   10/26/23 2300 10/27/23 0512 10/28/23 0500  Weight: 70.3 kg 70 kg 57.5 kg    Examination:  General: On 2 to 3 L oxygen via nasal cannula.  No distress.  Chronically ill and deconditioned. ENT/neck: No thyromegaly.  JVD is not elevated  respiratory: Decreased breath sounds at bases bilaterally with some crackles and wheezing  CVS: S1-S2 heard, rate controlled currently Abdominal: Soft, nontender, slightly distended; no organomegaly, bowel sounds are heard Extremities: Trace lower extremity edema; no cyanosis  CNS: Still extremely slow to respond, poor historian.  No focal neurologic deficit.  Moves extremities Lymph: No obvious lymphadenopathy Skin: No obvious ecchymosis/lesions  psych: Mostly flat affect.  Not agitated.   Musculoskeletal: No obvious joint  swelling/deformity     Data Reviewed: I have personally reviewed following labs and imaging studies  CBC: Recent Labs  Lab 10/26/23 0936 10/27/23 0405  WBC 9.8 8.3  NEUTROABS 7.4  --   HGB 11.8* 12.0*  HCT 37.1* 37.0*  MCV 85.3 84.3  PLT 281 302   Basic Metabolic Panel: Recent Labs  Lab 10/26/23 0935 10/26/23 0936 10/27/23 0405 10/28/23 0421  NA  --  137 135 133*  K  --  4.4 4.0 3.9  CL  --  106 105 105  CO2  --  20* 19* 18*  GLUCOSE  --  121* 130* 146*  BUN  --  42* 51* 47*  CREATININE  --  1.71* 1.73* 1.58*  CALCIUM   --  9.1 8.6* 8.2*  MG 2.0  --   --  1.9   GFR: Estimated Creatinine Clearance: 42 mL/min (A) (by C-G formula based on SCr of 1.58 mg/dL (H)). Liver Function Tests: Recent Labs  Lab 10/27/23 0405  AST 23  ALT 15  ALKPHOS 80  BILITOT 0.6  PROT 8.5*  ALBUMIN 2.9*   No results for input(s): LIPASE, AMYLASE in the last 168 hours. No  results for input(s): AMMONIA in the last 168 hours. Coagulation Profile: No results for input(s): INR, PROTIME in the last 168 hours. Cardiac Enzymes: No results for input(s): CKTOTAL, CKMB, CKMBINDEX, TROPONINI in the last 168 hours. BNP (last 3 results) No results for input(s): PROBNP in the last 8760 hours. HbA1C: No results for input(s): HGBA1C in the last 72 hours. CBG: No results for input(s): GLUCAP in the last 168 hours. Lipid Profile: No results for input(s): CHOL, HDL, LDLCALC, TRIG, CHOLHDL, LDLDIRECT in the last 72 hours. Thyroid Function Tests: Recent Labs    10/28/23 0421  TSH 1.408   Anemia Panel: No results for input(s): VITAMINB12, FOLATE, FERRITIN, TIBC, IRON, RETICCTPCT in the last 72 hours. Sepsis Labs: Recent Labs  Lab 10/26/23 9063 10/26/23 1035 10/26/23 1159 10/27/23 1154  PROCALCITON <0.10  --   --   --   LATICACIDVEN  --  2.0* 1.3 1.3    Recent Results (from the past 240 hours)  Resp panel by RT-PCR (RSV, Flu A&B, Covid)  Anterior Nasal Swab     Status: None   Collection Time: 10/26/23  9:56 AM   Specimen: Anterior Nasal Swab  Result Value Ref Range Status   SARS Coronavirus 2 by RT PCR NEGATIVE NEGATIVE Final    Comment: (NOTE) SARS-CoV-2 target nucleic acids are NOT DETECTED.  The SARS-CoV-2 RNA is generally detectable in upper respiratory specimens during the acute phase of infection. The lowest concentration of SARS-CoV-2 viral copies this assay can detect is 138 copies/mL. A negative result does not preclude SARS-Cov-2 infection and should not be used as the sole basis for treatment or other patient management decisions. A negative result may occur with  improper specimen collection/handling, submission of specimen other than nasopharyngeal swab, presence of viral mutation(s) within the areas targeted by this assay, and inadequate number of viral copies(<138 copies/mL). A negative result must be combined with clinical observations, patient history, and epidemiological information. The expected result is Negative.  Fact Sheet for Patients:  bloggercourse.com  Fact Sheet for Healthcare Providers:  seriousbroker.it  This test is no t yet approved or cleared by the [place]  FDA and  has been authorized for detection and/or diagnosis of SARS-CoV-2 by FDA under an Emergency Use Authorization (EUA). This EUA will remain  in effect (meaning this test can be used) for the duration of the COVID-19 declaration under Section 564(b)(1) of the Act, 21 U.S.C.section 360bbb-3(b)(1), unless the authorization is terminated  or revoked sooner.       Influenza A by PCR NEGATIVE NEGATIVE Final   Influenza B by PCR NEGATIVE NEGATIVE Final    Comment: (NOTE) The Xpert Xpress SARS-CoV-2/FLU/RSV plus assay is intended as an aid in the diagnosis of influenza from Nasopharyngeal swab specimens and should not be used as a sole basis for treatment. Nasal washings  and aspirates are unacceptable for Xpert Xpress SARS-CoV-2/FLU/RSV testing.  Fact Sheet for Patients: bloggercourse.com  Fact Sheet for Healthcare Providers: seriousbroker.it  This test is not yet approved or cleared by the [place]  FDA and has been authorized for detection and/or diagnosis of SARS-CoV-2 by FDA under an Emergency Use Authorization (EUA). This EUA will remain in effect (meaning this test can be used) for the duration of the COVID-19 declaration under Section 564(b)(1) of the Act, 21 U.S.C. section 360bbb-3(b)(1), unless the authorization is terminated or revoked.     Resp Syncytial Virus by PCR NEGATIVE NEGATIVE Final    Comment: (NOTE) Fact Sheet for Patients: bloggercourse.com  Fact Sheet  for Healthcare Providers: seriousbroker.it  This test is not yet approved or cleared by the [place]  FDA and has been authorized for detection and/or diagnosis of SARS-CoV-2 by FDA under an Emergency Use Authorization (EUA). This EUA will remain in effect (meaning this test can be used) for the duration of the COVID-19 declaration under Section 564(b)(1) of the Act, 21 U.S.C. section 360bbb-3(b)(1), unless the authorization is terminated or revoked.  Performed at [facility], [address], [place], [place] [address]   Blood culture (routine x 2)     Status: None (Preliminary result)   Collection Time: 10/26/23 10:35 AM   Specimen: BLOOD  Result Value Ref Range Status   Specimen Description   Final    BLOOD BLOOD LEFT ARM Performed at [facility], [address], [place], [place] [address]    Special Requests   Final    BOTTLES DRAWN AEROBIC AND ANAEROBIC Blood Culture adequate volume Performed at [facility], [address], [place], [place] [address]    Culture  Setup Time   Final    GRAM POSITIVE COCCI AEROBIC BOTTLE ONLY Gram Stain Report Called to,Read  Back By and Verified With: DELENA LINE RN [phone] K FORSYTH    Culture   Final    CULTURE REINCUBATED FOR BETTER GROWTH Performed at [facility], [address], [place], [place] [address]    Report Status PENDING  Incomplete  Blood culture (routine x 2)     Status: None (Preliminary result)   Collection Time: 10/26/23 10:35 AM   Specimen: BLOOD  Result Value Ref Range Status   Specimen Description BLOOD BLOOD RIGHT ARM AEROBIC BOTTLE ONLY  Final   Special Requests   Final    Blood Culture adequate volume BOTTLES DRAWN AEROBIC ONLY   Culture   Final    NO GROWTH 2 DAYS Performed at [facility], [address], [place], [place] [address]    Report Status PENDING  Incomplete  Blood Culture ID Panel (Reflexed)     Status: None   Collection Time: 10/26/23 10:35 AM  Result Value Ref Range Status   Enterococcus faecalis NOT DETECTED NOT DETECTED Final   Enterococcus Faecium NOT DETECTED NOT DETECTED Final   Listeria monocytogenes NOT DETECTED NOT DETECTED Final   Staphylococcus species NOT DETECTED NOT DETECTED Final   Staphylococcus aureus (BCID) NOT DETECTED NOT DETECTED Final   Staphylococcus epidermidis NOT DETECTED NOT DETECTED Final   Staphylococcus lugdunensis NOT DETECTED NOT DETECTED Final   Streptococcus species NOT DETECTED NOT DETECTED Final   Streptococcus agalactiae NOT DETECTED NOT DETECTED Final   Streptococcus pneumoniae NOT DETECTED NOT DETECTED Final   Streptococcus pyogenes NOT DETECTED NOT DETECTED Final   A.calcoaceticus-baumannii NOT DETECTED NOT DETECTED Final   Bacteroides fragilis NOT DETECTED NOT DETECTED Final   Enterobacterales NOT DETECTED NOT DETECTED Final   Enterobacter cloacae complex NOT DETECTED NOT DETECTED Final   Escherichia coli NOT DETECTED NOT DETECTED Final   Klebsiella aerogenes NOT DETECTED NOT DETECTED Final   Klebsiella oxytoca NOT DETECTED NOT DETECTED Final   Klebsiella pneumoniae NOT DETECTED NOT DETECTED Final   Proteus  species NOT DETECTED NOT DETECTED Final   Salmonella species NOT DETECTED NOT DETECTED Final   Serratia marcescens NOT DETECTED NOT DETECTED Final   Haemophilus influenzae NOT DETECTED NOT DETECTED Final   Neisseria meningitidis NOT DETECTED NOT DETECTED Final   Pseudomonas aeruginosa NOT DETECTED NOT DETECTED Final   Stenotrophomonas maltophilia NOT DETECTED NOT DETECTED Final  Candida albicans NOT DETECTED NOT DETECTED Final   Candida auris NOT DETECTED NOT DETECTED Final   Candida glabrata NOT DETECTED NOT DETECTED Final   Candida krusei NOT DETECTED NOT DETECTED Final   Candida parapsilosis NOT DETECTED NOT DETECTED Final   Candida tropicalis NOT DETECTED NOT DETECTED Final   Cryptococcus neoformans/gattii NOT DETECTED NOT DETECTED Final    Comment: Performed at [facility], [address] [place], [place] [address]  Culture, blood (Routine X 2) w Reflex to ID Panel     Status: None (Preliminary result)   Collection Time: 10/27/23  2:17 PM   Specimen: BLOOD  Result Value Ref Range Status   Specimen Description BLOOD BLOOD RIGHT ARM  Final   Special Requests   Final    Blood Culture adequate volume BOTTLES DRAWN AEROBIC ONLY   Culture   Final    NO GROWTH < 24 HOURS Performed at [facility], [address], [place], [place] [address]    Report Status PENDING  Incomplete  Culture, blood (Routine X 2) w Reflex to ID Panel     Status: None (Preliminary result)   Collection Time: 10/27/23  2:22 PM   Specimen: BLOOD  Result Value Ref Range Status   Specimen Description BLOOD BLOOD LEFT HAND  Final   Special Requests   Final    Blood Culture results may not be optimal due to an inadequate volume of blood received in culture bottles BOTTLES DRAWN AEROBIC ONLY   Culture   Final    NO GROWTH < 24 HOURS Performed at [facility], [address] [place], [place] [address]    Report Status PENDING  Incomplete         Radiology Studies: ECHOCARDIOGRAM COMPLETE Result  Date: 10/27/2023    ECHOCARDIOGRAM REPORT   Patient Name:   Rick Williams Date of Exam: 10/27/2023 Medical Rec #:  [id number]          Height:       69.0 in Accession #:    [id number]         Weight:       154.3 lb Date of Birth:  1966/07/19          BSA:          1.850 m Patient Age:    57 years           BP:           101/87 mmHg Patient Gender: M                  HR:           64 bpm. Exam Location:  [facility] Procedure: 2D Echo, Cardiac Doppler and Color Doppler Indications:    CHF-Acute Diastolic I50.31  History:        Patient has prior history of Echocardiogram examinations, most                 recent 01/20/2020. CHF; Risk Factors:Hypertension. ETOH abuse.  Sonographer:    Aida Pizza RCS Referring Phys: [id number] EJIROGHENE E EMOKPAE IMPRESSIONS  1. Left ventricular ejection fraction, by estimation, is 65 to 70%. The left ventricle has normal function. The left ventricle has no regional wall motion abnormalities. There is severe asymmetric left ventricular hypertrophy of the inferior and lateral  segments. Left ventricular diastolic parameters are consistent with Grade I diastolic dysfunction (impaired relaxation). There is the interventricular septum is flattened in systole and diastole, consistent with right  ventricular pressure and volume overload.  2. Right ventricular systolic function is severely reduced. The right ventricular size is severely enlarged and heavily trabeculated. Mildly increased right ventricular wall thickness. There is severely elevated pulmonary artery systolic pressure. The estimated right ventricular systolic pressure is 107.9 mmHg.  3. Left atrial size was upper normal.  4. Right atrial size was severely dilated.  5. Moderate pericardial effusion. The pericardial effusion is posterior and lateral to the left ventricle. There is no evidence of cardiac tamponade.  6. The tricuspid valve is abnormal. Tricuspid valve regurgitation is moderate to severe.  7. The aortic valve is  tricuspid. Aortic valve regurgitation is not visualized.  8. The inferior vena cava is dilated in size with <50% respiratory variability, suggesting right atrial pressure of 15 mmHg.  9. The mitral valve is grossly normal. Trivial mitral valve regurgitation. Comparison(s): Prior images reviewed side by side. RV dysfunction and severe pulmonary hypertension are new in comparison. FINDINGS  Left Ventricle: Left ventricular ejection fraction, by estimation, is 65 to 70%. The left ventricle has normal function. The left ventricle has no regional wall motion abnormalities. The left ventricular internal cavity size was small. There is severe asymmetric left ventricular hypertrophy of the inferior and lateral segments. The interventricular septum is flattened in systole and diastole, consistent with right ventricular pressure and volume overload. Left ventricular diastolic parameters are consistent with Grade I diastolic dysfunction (impaired relaxation). Right Ventricle: The right ventricular size is severely enlarged. Mildly increased right ventricular wall thickness. Right ventricular systolic function is severely reduced. There is severely elevated pulmonary artery systolic pressure. The tricuspid regurgitant velocity is 4.82 m/s, and with an assumed right atrial pressure of 15 mmHg, the estimated right ventricular systolic pressure is 107.9 mmHg. Left Atrium: Left atrial size was upper normal. Right Atrium: Right atrial size was severely dilated. Pericardium: A moderately sized pericardial effusion is present. The pericardial effusion is posterior and lateral to the left ventricle. There is no evidence of cardiac tamponade. Mitral Valve: The mitral valve is grossly normal. Trivial mitral valve regurgitation. Tricuspid Valve: The tricuspid valve is abnormal. Tricuspid valve regurgitation is moderate to severe. Aortic Valve: The aortic valve is tricuspid. Aortic valve regurgitation is not visualized. Pulmonic Valve: The  pulmonic valve was grossly normal. Pulmonic valve regurgitation is trivial. Aorta: The aortic root is normal in size and structure. Venous: The inferior vena cava is dilated in size with less than 50% respiratory variability, suggesting right atrial pressure of 15 mmHg. IAS/Shunts: No atrial level shunt detected by color flow Doppler.  LEFT VENTRICLE PLAX 2D LVIDd:         2.80 cm   Diastology LVIDs:         1.70 cm   LV e' medial:    5.33 cm/s LV PW:         1.60 cm   LV E/e' medial:  10.8 LV IVS:        1.20 cm   LV e' lateral:   8.38 cm/s LVOT diam:     2.20 cm   LV E/e' lateral: 6.9 LV SV:         40 LV SV Index:   22 LVOT Area:     3.80 cm  RIGHT VENTRICLE RV S prime:     10.70 cm/s TAPSE (M-mode): 1.4 cm LEFT ATRIUM             Index        RIGHT ATRIUM  Index LA diam:        3.50 cm 1.89 cm/m   RA Area:     36.90 cm LA Vol (A2C):   64.2 ml 34.72 ml/m  RA Volume:   173.00 ml 93.49 ml/m LA Vol (A4C):   56.3 ml 30.43 ml/m LA Biplane Vol: 63.4 ml 34.26 ml/m  AORTIC VALVE LVOT Vmax:   78.30 cm/s LVOT Vmean:  47.400 cm/s LVOT VTI:    0.106 m  AORTA Ao Root diam: 3.20 cm MITRAL VALVE               TRICUSPID VALVE MV Area (PHT): 1.46 cm    TR Peak grad:   92.9 mmHg MV Decel Time: 521 msec    TR Vmax:        482.00 cm/s MV E velocity: 57.80 cm/s MV A velocity: 68.20 cm/s  SHUNTS MV E/A ratio:  0.85        Systemic VTI:  0.11 m                            Systemic Diam: 2.20 cm Jayson Sierras MD Electronically signed by Jayson Sierras MD Signature Date/Time: 10/27/2023/5:15:36 PM    Final    CT Angio Chest PE W and/or Wo Contrast Result Date: 10/26/2023 CLINICAL DATA:  Shortness of breath. Clinical concern for pulmonary embolism. EXAM: CT ANGIOGRAPHY CHEST WITH CONTRAST TECHNIQUE: Multidetector CT imaging of the chest was performed using the standard protocol during bolus administration of intravenous contrast. Multiplanar CT image reconstructions and MIPs were obtained to evaluate the vascular  anatomy. RADIATION DOSE REDUCTION: This exam was performed according to the departmental dose-optimization program which includes automated exposure control, adjustment of the mA and/or kV according to patient size and/or use of iterative reconstruction technique. CONTRAST:  75mL OMNIPAQUE  IOHEXOL  350 MG/ML SOLN COMPARISON:  Chest radiographs obtained earlier today. FINDINGS: Cardiovascular: Normally opacified pulmonary arteries with no pulmonary arterial filling defects seen. Enlarged heart moderate-sized pericardial effusion with a maximum thickness of 2.6 cm. Atheromatous calcifications, including the coronary arteries and aorta. Dilated central pulmonary arteries with a main pulmonary artery diameter of 3.8 cm. Mediastinum/Nodes: Enlarged mediastinal and bilateral hilar lymph nodes. These include a right inferior paratracheal node with a short axis diameter of 10 mm on image number 43/4, AP window lymph node with a short axis diameter of 10 mm on image number 43/4, right hilar node with a short axis diameter of 17 mm on image number 51/4 and subcarinal node with a short axis diameter of 10 mm on image number 57/4. Borderline enlarged left axillary nodes, the largest measuring 8 mm in short axis diameter on image number 39/4 mildly enlarged gastrohepatic ligament lymph node with a short axis diameter of 10 mm on image number 105/4. Unremarkable thyroid gland, trachea and esophagus. Lungs/Pleura: Prominent pulmonary vasculature and mildly prominent interstitial markings, especially in the lower lung zones. Small amount of patchy density in the right lower lobe. No pleural fluid. Upper Abdomen: Mildly dilated inferior vena cava and hepatic veins with reflux of contrast from the right atrium into the hepatic veins Musculoskeletal: Mild dextroconvex thoracic scoliosis. Lower cervical spine degenerative changes. Review of the MIP images confirms the above findings. IMPRESSION: 1. No pulmonary embolism. 2. Cardiomegaly  with a moderate-sized pericardial effusion, mild pulmonary vascular congestion and mild interstitial pulmonary edema, consistent with congestive heart failure. 3. Small amount of patchy density in the right lower lobe, which could represent atelectasis,  pneumonia or focal alveolar edema. 4. Dilated central pulmonary arteries, consistent with pulmonary arterial hypertension. 5. Mild mediastinal, bilateral hilar, left axillary and gastrohepatic ligament lymphadenopathy. This is nonspecific. 6.  Calcific coronary artery and aortic atherosclerosis. Aortic Atherosclerosis (ICD10-I70.0). Electronically Signed   By: Elspeth Bathe M.D.   On: 10/26/2023 14:33   DG Chest 2 View Result Date: 10/26/2023 CLINICAL DATA:  Shortness of breath. EXAM: CHEST - 2 VIEW COMPARISON:  05/25/2023. FINDINGS: Redemonstration of linear area of scarring/atelectasis in the right lung apex. Bilateral lung fields are otherwise clear. No acute consolidation or lung collapse. No pulmonary edema. Bilateral costophrenic angles are clear. Mildly enlarged cardio-mediastinal silhouette, which is likely due to AP technique. No acute osseous abnormalities. The soft tissues are within normal limits. IMPRESSION: *No active cardiopulmonary disease. Electronically Signed   By: Ree Molt M.D.   On: 10/26/2023 11:39        Scheduled Meds:  atorvastatin   40 mg Oral QHS   budesonide  (PULMICORT ) nebulizer solution  0.5 mg Nebulization BID   feeding supplement  237 mL Oral BID BM   furosemide   40 mg Intravenous Once   heparin   5,000 Units Subcutaneous Q8H   ipratropium-albuterol   3 mL Nebulization BID   levETIRAcetam   500 mg Oral BID   levothyroxine   100 mcg Oral Daily   methylPREDNISolone  (SOLU-MEDROL ) injection  80 mg Intravenous Daily   Continuous Infusions:  sodium chloride  10 mL/hr at 10/27/23 2049   vancomycin  1,000 mg (10/27/23 2053)          Sophie Mao, MD [facility] 10/28/2023, 7:06 AM

## 2023-10-28 NOTE — Progress Notes (Signed)
 Patient new admit arrived with handcuffs and ankle cuffs from [facility]. Handcuffs removed by officer at bedside as soon as patient arrived. Skin around BUE intact. Skin around BLE intact, dry and scaly upon admission. Patient given bed bath and lotion applied. Verdel LOISE Shams, RN

## 2023-10-28 NOTE — Progress Notes (Signed)
 Ambulated with assist to bathroom for small bm.  Was more steady on way back to bed.  O2 on room air dropped to upper 80's and placed back on 3 liters.

## 2023-10-28 NOTE — Consult Note (Addendum)
 Advanced Heart Failure Team Consult Note   Primary Physician: Patient, No Pcp Per Cardiologist:  Jayson Sierras, MD  Reason for Consultation: RV Failure and Pulmonary Hypertension  HPI:    Rick Williams is seen today for evaluation of RV Failure and Pulmonary Hypertension at the request of Dr. Cheryle.   Rick Williams is a 58 yo male with PMH of systolic heart failure, hemorraghic stroke 2007, COPD,  hx ETOH abuse, tobacco use and hypertension.   Presented to [facility] ED from [facility] on 10/26/23 with worsening dyspnea X 1-2 days. He was hypoxic on arrival with O2 sats in the 70s and hypothermic with rectal temp of 94.81F. BNP elevated at 2518. Lactic acid 2.0>1.3. Negative for COVID, influenza and RSV. CTA negative for PE, noted to have moderate pericardial effusion, evidence of CHF, patchy density RLL, and dilated pulmonary arteries.He was admitted for management of acute respiratory failure thought to be 2/2 combination of acute CHF +/- COPD exacerbation.   He was started on IV anitbiotics, steroids, nebs, and given IVF in ED d/t concern for sepsis. He was then given IV Lasix  40 mg, however intermittently being held for hypotension. Echo performed which showed EF 65-70%, severe asymmetric LVH inferior and lateral segments, RV severely enlarged and severely reduced, RVSP 108 mmHg, severe RAE, moderate pericardial effusion, moderate to severe TR  He was transferred to [facility] for Advanced Heart Failure evaluation of RV failure and pHTN.  Short of breath with minimal exertion. Unable to ambulate without assistance d/t fatigue and dyspnea.  Home Medications Prior to Admission medications   Medication Sig Start Date End Date Taking? Authorizing Provider  albuterol  (VENTOLIN  HFA) 108 (90 Base) MCG/ACT inhaler Inhale 2 puffs into the lungs every 6 (six) hours as needed for wheezing or shortness of breath. 05/24/23  Yes [provider]  amLODipine  (NORVASC ) 10 MG tablet Take  10 mg by mouth daily. 05/24/23  Yes [provider]  carvedilol  (COREG ) 6.25 MG tablet Take 6.25 mg by mouth 2 (two) times daily. 05/24/23  Yes [provider]  cloNIDine  (CATAPRES ) 0.2 MG tablet Take 0.2 mg by mouth 2 (two) times daily. 10/21/23  Yes [provider]  clopidogrel  (PLAVIX ) 75 MG tablet Take 75 mg by mouth daily. 05/24/23  Yes [provider]  folic acid  (FOLVITE ) 1 MG tablet Take 1 tablet (1 mg total) by mouth daily. 01/23/20  Yes Rojelio Nest, DO  hydrOXYzine  (VISTARIL ) 25 MG capsule Take 1 capsule (25 mg total) by mouth every 8 (eight) hours as needed for anxiety. Patient taking differently: Take 25-50 mg by mouth in the morning and at bedtime. Take 25 mg am and 50 mg pm 05/28/23  Yes Tat, David, MD  levETIRAcetam  (KEPPRA ) 500 MG tablet Take 500 mg by mouth 2 (two) times daily. 05/24/23  Yes [provider]  levothyroxine  (SYNTHROID ) 100 MCG tablet Take 100 mcg by mouth daily. 10/21/23  Yes [provider]  mirtazapine  (REMERON ) 15 MG tablet Take 15 mg by mouth at bedtime. 10/21/23  Yes [provider]  Multiple Vitamin (DAILY-VITE) TABS Take 1 tablet by mouth daily. 05/24/23  Yes [provider]  Nutritional Supplements (ENSURE NUTRITION SHAKE) LIQD Take 237 mLs by mouth in the morning and at bedtime. 10/14/23  Yes [provider]  triamcinolone  cream (KENALOG ) 0.1 % Apply 1 Application topically 2 (two) times daily. Apply to feet and legs 08/12/23  Yes [provider]    Past Medical History: Past Medical  History:  Diagnosis Date   Alcohol abuse    Cerebral hemorrhage (HCC) 2014   COPD (chronic obstructive pulmonary disease) (HCC)    Hemothorax on left    Hypertension    Pneumonia    Stroke [facility]) 2007   no deficits   Thyroid disease     Past Surgical History: History reviewed. No pertinent surgical history.  Family History: Family History  Problem Relation Age of Onset   Hypertension  Mother    Hypertension Father     Social History: Social History   Socioeconomic History   Marital status: Married    Spouse name: Not on file   Number of children: Not on file   Years of education: Not on file   Highest education level: Not on file  Occupational History   Not on file  Tobacco Use   Smoking status: Every Day    Current packs/day: 0.50    Types: Cigarettes   Smokeless tobacco: Never  Vaping Use   Vaping status: Never Used  Substance and Sexual Activity   Alcohol use: Not Currently    Comment: twice a week    Drug use: Not Currently    Types: Marijuana    Comment: last used 2 weeks ago   Sexual activity: Not on file  Other Topics Concern   Not on file  Social History Narrative   Not on file   Social Drivers of Health   Financial Resource Strain: Not on file  Food Insecurity: Patient Declined (10/26/2023)   Hunger Vital Sign    Worried About Running Out of Food in the Last Year: Patient declined    Ran Out of Food in the Last Year: Patient declined  Transportation Needs: No Transportation Needs (10/26/2023)   PRAPARE - Administrator, Civil Service (Medical): No    Lack of Transportation (Non-Medical): No  Physical Activity: Not on file  Stress: Not on file  Social Connections: Not on file    Allergies:  Allergies  Allergen Reactions   Benadryl [Diphenhydramine Hcl (Sleep)] Itching and Rash   Objective:    Vital Signs:   Temp:  [97.8 F (36.6 C)-98.1 F (36.7 C)] 97.8 F (36.6 C) (02/07 0334) Pulse Rate:  [88-102] 102 (02/07 1324) Resp:  [15-16] 15 (02/07 1324) BP: (100-134)/(73-112) 134/112 (02/07 1324) SpO2:  [91 %-100 %] 92 % (02/07 1324) Weight:  [57.5 kg] 57.5 kg (02/07 0500) Last BM Date : 10/28/23  Weight change: Filed Weights   10/26/23 2300 10/27/23 0512 10/28/23 0500  Weight: 70.3 kg 70 kg 57.5 kg   Intake/Output:   Intake/Output Summary (Last 24 hours) at 10/28/2023 1530 Last data filed at 10/28/2023 1300 Gross  per 24 hour  Intake 840 ml  Output 500 ml  Net 340 ml    Physical Exam    General:  Thin, chronically ill appearing Neck: JVP elevated Cor: Regular rate & rhythm. No rubs, gallops, + TR murmur Lungs: clear Abdomen: soft, nontender, nondistended.  Extremities: no cyanosis, clubbing, rash, edema Neuro: alert & orientedx3. Affect pleasant   EKG    SR 74 bpm  Labs   Basic Metabolic Panel: Recent Labs  Lab 10/26/23 0935 10/26/23 0936 10/27/23 0405 10/28/23 0421  NA  --  137 135 133*  K  --  4.4 4.0 3.9  CL  --  106 105 105  CO2  --  20* 19* 18*  GLUCOSE  --  121* 130* 146*  BUN  --  42* 51*  47*  CREATININE  --  1.71* 1.73* 1.58*  CALCIUM   --  9.1 8.6* 8.2*  MG 2.0  --   --  1.9   Liver Function Tests: Recent Labs  Lab 10/27/23 0405  AST 23  ALT 15  ALKPHOS 80  BILITOT 0.6  PROT 8.5*  ALBUMIN 2.9*   No results for input(s): LIPASE, AMYLASE in the last 168 hours. No results for input(s): AMMONIA in the last 168 hours.  CBC: Recent Labs  Lab 10/26/23 0936 10/27/23 0405  WBC 9.8 8.3  NEUTROABS 7.4  --   HGB 11.8* 12.0*  HCT 37.1* 37.0*  MCV 85.3 84.3  PLT 281 302   Cardiac Enzymes: No results for input(s): CKTOTAL, CKMB, CKMBINDEX, TROPONINI in the last 168 hours.  BNP: BNP (last 3 results) Recent Labs    10/26/23 1100  BNP 2,518.0*   ProBNP (last 3 results) No results for input(s): PROBNP in the last 8760 hours.   CBG: No results for input(s): GLUCAP in the last 168 hours.  Coagulation Studies: No results for input(s): LABPROT, INR in the last 72 hours.  Imaging   ECHOCARDIOGRAM COMPLETE Result Date: 10/27/2023    ECHOCARDIOGRAM REPORT   Patient Name:   Rick Williams Date of Exam: 10/27/2023 Medical Rec #:  [id number]          Height:       69.0 in Accession #:    [id number]         Weight:       154.3 lb Date of Birth:  September 26, 1965          BSA:          1.850 m Patient Age:    57 years           BP:            101/87 mmHg Patient Gender: M                  HR:           64 bpm. Exam Location:  [facility] Procedure: 2D Echo, Cardiac Doppler and Color Doppler Indications:    CHF-Acute Diastolic I50.31  History:        Patient has prior history of Echocardiogram examinations, most                 recent 01/20/2020. CHF; Risk Factors:Hypertension. ETOH abuse.  Sonographer:    Aida Pizza RCS Referring Phys: [phone] Rick Williams IMPRESSIONS  1. Left ventricular ejection fraction, by estimation, is 65 to 70%. The left ventricle has normal function. The left ventricle has no regional wall motion abnormalities. There is severe asymmetric left ventricular hypertrophy of the inferior and lateral  segments. Left ventricular diastolic parameters are consistent with Grade I diastolic dysfunction (impaired relaxation). There is the interventricular septum is flattened in systole and diastole, consistent with right ventricular pressure and volume overload.  2. Right ventricular systolic function is severely reduced. The right ventricular size is severely enlarged and heavily trabeculated. Mildly increased right ventricular wall thickness. There is severely elevated pulmonary artery systolic pressure. The estimated right ventricular systolic pressure is 107.9 mmHg.  3. Left atrial size was upper normal.  4. Right atrial size was severely dilated.  5. Moderate pericardial effusion. The pericardial effusion is posterior and lateral to the left ventricle. There is no evidence of cardiac tamponade.  6. The tricuspid valve is abnormal. Tricuspid valve regurgitation is moderate to severe.  7. The  aortic valve is tricuspid. Aortic valve regurgitation is not visualized.  8. The inferior vena cava is dilated in size with <50% respiratory variability, suggesting right atrial pressure of 15 mmHg.  9. The mitral valve is grossly normal. Trivial mitral valve regurgitation. Comparison(s): Prior images reviewed side by side. RV dysfunction and  severe pulmonary hypertension are new in comparison. FINDINGS  Left Ventricle: Left ventricular ejection fraction, by estimation, is 65 to 70%. The left ventricle has normal function. The left ventricle has no regional wall motion abnormalities. The left ventricular internal cavity size was small. There is severe asymmetric left ventricular hypertrophy of the inferior and lateral segments. The interventricular septum is flattened in systole and diastole, consistent with right ventricular pressure and volume overload. Left ventricular diastolic parameters are consistent with Grade I diastolic dysfunction (impaired relaxation). Right Ventricle: The right ventricular size is severely enlarged. Mildly increased right ventricular wall thickness. Right ventricular systolic function is severely reduced. There is severely elevated pulmonary artery systolic pressure. The tricuspid regurgitant velocity is 4.82 m/s, and with an assumed right atrial pressure of 15 mmHg, the estimated right ventricular systolic pressure is 107.9 mmHg. Left Atrium: Left atrial size was upper normal. Right Atrium: Right atrial size was severely dilated. Pericardium: A moderately sized pericardial effusion is present. The pericardial effusion is posterior and lateral to the left ventricle. There is no evidence of cardiac tamponade. Mitral Valve: The mitral valve is grossly normal. Trivial mitral valve regurgitation. Tricuspid Valve: The tricuspid valve is abnormal. Tricuspid valve regurgitation is moderate to severe. Aortic Valve: The aortic valve is tricuspid. Aortic valve regurgitation is not visualized. Pulmonic Valve: The pulmonic valve was grossly normal. Pulmonic valve regurgitation is trivial. Aorta: The aortic root is normal in size and structure. Venous: The inferior vena cava is dilated in size with less than 50% respiratory variability, suggesting right atrial pressure of 15 mmHg. IAS/Shunts: No atrial level shunt detected by color flow  Doppler.  LEFT VENTRICLE PLAX 2D LVIDd:         2.80 cm   Diastology LVIDs:         1.70 cm   LV e' medial:    5.33 cm/s LV PW:         1.60 cm   LV E/e' medial:  10.8 LV IVS:        1.20 cm   LV e' lateral:   8.38 cm/s LVOT diam:     2.20 cm   LV E/e' lateral: 6.9 LV SV:         40 LV SV Index:   22 LVOT Area:     3.80 cm  RIGHT VENTRICLE RV S prime:     10.70 cm/s TAPSE (M-mode): 1.4 cm LEFT ATRIUM             Index        RIGHT ATRIUM           Index LA diam:        3.50 cm 1.89 cm/m   RA Area:     36.90 cm LA Vol (A2C):   64.2 ml 34.72 ml/m  RA Volume:   173.00 ml 93.49 ml/m LA Vol (A4C):   56.3 ml 30.43 ml/m LA Biplane Vol: 63.4 ml 34.26 ml/m  AORTIC VALVE LVOT Vmax:   78.30 cm/s LVOT Vmean:  47.400 cm/s LVOT VTI:    0.106 m  AORTA Ao Root diam: 3.20 cm MITRAL VALVE  TRICUSPID VALVE MV Area (PHT): 1.46 cm    TR Peak grad:   92.9 mmHg MV Decel Time: 521 msec    TR Vmax:        482.00 cm/s MV E velocity: 57.80 cm/s MV A velocity: 68.20 cm/s  SHUNTS MV E/A ratio:  0.85        Systemic VTI:  0.11 m                            Systemic Diam: 2.20 cm Jayson Sierras MD Electronically signed by Jayson Sierras MD Signature Date/Time: 10/27/2023/5:15:36 PM    Final    Medications:    Current Medications:  atorvastatin   40 mg Oral QHS   budesonide  (PULMICORT ) nebulizer solution  0.5 mg Nebulization BID   feeding supplement  237 mL Oral BID BM   heparin   5,000 Units Subcutaneous Q8H   ipratropium-albuterol   3 mL Nebulization BID   levETIRAcetam   500 mg Oral BID   levothyroxine   100 mcg Oral Daily   methylPREDNISolone  (SOLU-MEDROL ) injection  80 mg Intravenous Daily    Infusions:  sodium chloride  10 mL/hr at 10/27/23 2049   Patient Profile   Rick Williams is a 58 yo male with PMH of systolic heart failure, hemorraghic stroke 2007, COPD, and hypertension. Admitted with acute hypoxic respiratory failure 2/2 pulmonary hypertension and RV failure.  Assessment/Plan  RV  failure Pulmonary Hypertension -New diagnosis -Hypoxic with sats in 70s on presentation -Echo EF 65-70%, severe asymmetric LVH inferior and lateral segments, RV severely enlarged and severely reduced, RVSP 108 mmHg, severe RAE, moderate pericardial effusion, moderate to severe TR - Etiology uncertain. Suspect primarily WHO group 1 - Plan RHC this afternoon to better assess PA pressures/hemodynamics - Eventual V/Q scan, PFTs, possible HiRes CT -Sleep study as outpatient but will depend on timing of release from jail (has upcoming court date) - CTD serologies ordered  RHC consent Informed Consent   Shared Decision Making/Informed Consent The risks [stroke (1 in 1000), death (1 in 1000), kidney failure [usually temporary] (1 in 500), bleeding (1 in 200), allergic reaction [possibly serious] (1 in 200)], benefits (diagnostic support and management of coronary artery disease) and alternatives of a cardiac catheterization were discussed in detail with Rick Williams and he is willing to proceed.     Signed,  Clarece Drzewiecki N, PA-C  4:31 PM 10/28/2023  [facility]   Pericardial Effusion - Moderate in severity on echo 2/6, no evidence of tamponade - Will need to follow  AKI on CKD Stage 2 - baseline Cr ~1.3 - on admit 1.73>1.58 today - daily BMET  Tricuspid Regurgitation - in the setting of RV Failure. RV and RA severely dilated - moderate to severe on echo  COPD - consider stopping Solu-Medrol , presentation more likely d/t pulmonary hypertension/RV failure rather than COPDE - continue nebs per primary  SDOH: Admitted from [facility] jail. Will be going back to jail after discharge. It is unclear if he had insurance prior to incarceration. He believes he may have medicaid. Will consult HF TOC CM/SW.  Length of Stay: 2  Jordan Lee, NP  10/28/2023, 3:30 PM  Advanced Heart Failure Team Pager [phone] (M-F; 7a - 5p)  Please contact [facility] for night-coverage  after hours (4p -7a ) and weekends on [email or web address]

## 2023-10-28 NOTE — Care Management (Signed)
 Contact info for [facility] Nurses station [phone]; fax [phone] send communication attention Medical

## 2023-10-28 NOTE — H&P (View-Only) (Signed)
 Patient Name: Rick Williams Date of Encounter: 10/28/2023 [facility] Cardiologist: Jayson Sierras, MD (New)  Interval Summary  .    Reviewed echo findings with the patient and need for transfer to [facility] and evaluation by the Advanced Heart Failure team. In agreement with this and officer at the bedside updated as well about pending transfer once a bed becomes available.   He reports his breathing has improved. No chest pain or palpitations. He was a heavy smoker and drinker but quit smoking cigarettes and consuming alcohol over a year ago. When talking about prior lung issues, says he was diagnosed with asthma as a child. Also had a chest tube placed 20+ years ago but no issues following this.   Vital Signs .    Vitals:   10/27/23 1629 10/27/23 2200 10/28/23 0334 10/28/23 0500  BP: 100/73 (!) 127/106 (!) 121/97   Pulse: 88 96 98   Resp:   16   Temp:  98.1 F (36.7 C) 97.8 F (36.6 C)   TempSrc:  Oral    SpO2:  96% 91%   Weight:    57.5 kg  Height:        Intake/Output Summary (Last 24 hours) at 10/28/2023 1009 Last data filed at 10/28/2023 0800 Gross per 24 hour  Intake 640 ml  Output 1000 ml  Net -360 ml      10/28/2023    5:00 AM 10/27/2023    5:12 AM 10/26/2023   11:00 PM  Last 3 Weights  Weight (lbs) 126 lb 12.2 oz 154 lb 5.2 oz 154 lb 15.7 oz  Weight (kg) 57.5 kg 70 kg 70.3 kg      Telemetry/ECG/Cardiac Studies:    Telemetry: NSR, HR in 90's to low 100's. Frequent PVC's and occasional episodes of ventricular bigeminy.  - Personally Reviewed  Echocardiogram: 10/27/2023 IMPRESSIONS    1. Left ventricular ejection fraction, by estimation, is 65 to 70%. The  left ventricle has normal function. The left ventricle has no regional  wall motion abnormalities. There is severe asymmetric left ventricular  hypertrophy of the inferior and lateral   segments. Left ventricular diastolic parameters are consistent with Grade  I diastolic dysfunction  (impaired relaxation). There is the  interventricular septum is flattened in systole and diastole, consistent  with right ventricular pressure and volume  overload.   2. Right ventricular systolic function is severely reduced. The right  ventricular size is severely enlarged and heavily trabeculated. Mildly  increased right ventricular wall thickness. There is severely elevated  pulmonary artery systolic pressure. The  estimated right ventricular systolic pressure is 107.9 mmHg.   3. Left atrial size was upper normal.   4. Right atrial size was severely dilated.   5. Moderate pericardial effusion. The pericardial effusion is posterior  and lateral to the left ventricle. There is no evidence of cardiac  tamponade.   6. The tricuspid valve is abnormal. Tricuspid valve regurgitation is  moderate to severe.   7. The aortic valve is tricuspid. Aortic valve regurgitation is not  visualized.   8. The inferior vena cava is dilated in size with <50% respiratory  variability, suggesting right atrial pressure of 15 mmHg.   9. The mitral valve is grossly normal. Trivial mitral valve  regurgitation.   Comparison(s): Prior images reviewed side by side. RV dysfunction and  severe pulmonary hypertension are new in comparison.    Physical Exam .    GEN: Appears in no acute distress.  Neck: JVD at 10-12 cm.  Cardiac: RRR, no murmurs, rubs, or gallops.  Respiratory: Scattered rhonchi. No wheezing or rales.  GI: Soft, nontender, non-distended  MS: No pitting edema  Assessment & Plan .     1. Pulmonary HTN/RV Failure - Presented with worsening dyspnea and BNP elevated at 2518. LA at 2.0 on admission and down-trending to 1.3 on repeat checks. UDS negative. Repeat echo obtained yesterday and shows a preserved EF of 65-70% but noted to have severely reduced RV function and PASP severely elevated at 107.9 mmHg. Reviewed with Dr. Debera and recommendations are to transfer to [facility] for evaluation  by Advanced Heart Failure and likely RHC. Card Master and Advanced Heart Failure team aware.   2. Pericardial Effusion - Moderate by echo this admission with no evidence of tamponade. Continue to follow.   3. HTN - BP has been low-normal, at 100/73-127/106 within the past 24 hours, at 121/97 on most recent check. PTA Amlodipine , Coreg  and Clonidine  currently held.   4. Stage 3 CKD - Baseline creatinine 1.2 - 1.4. Peaked at 1.73 this admission, at 1.58 today.   5. Hypothyroidism - TSH 1.408 this admission. Remains on Synthroid  100 mcg daily.   For questions or updates, please contact [facility] Please consult [email or web address] for contact info under        Signed, [name], PA-C    Attending note:  Patient seen and examined.  Case discussed with the [name], agree with her above findings.  Echocardiogram report reviewed yesterday with hospitalist team.  He has evidence of severe RV dysfunction and marked pulmonary hypertension with estimated RVSP approximately 108 mmHg, RV dysfunction new in comparison to prior echocardiogram from 2021.  Chest CTA during this admission negative for pulmonary embolus, pulmonary arterial hypertension is most suspected although he has not been worked up at all so far.  UDS negative, TSH 1.408.  Does have prior tobacco use history but reportedly none in the last year.  Presently incarcerated as discussed in the consultation note.  He is more alert today and interactive, afebrile, heart rate in the 80s to 90s in sinus rhythm, blood pressure 121/97, oxygen saturation high 90s on supplemental oxygen via nasal cannula.  Lungs without wheezing.  Cardiac exam reveals RRR, soft apical systolic murmur.  No pitting edema.  Pertinent lab work includes potassium 3.9, BUN 47, creatinine 1.58, AST 23, ALT 15, hemoglobin 12.0.  Plan is transfer to the hospitalist team at [facility] so that the advanced heart failure team can be involved in his  workup.  Anticipate right heart catheterization as first step.  [name], M.D., F.A.C.C.

## 2023-10-28 NOTE — Plan of Care (Signed)

## 2023-10-28 NOTE — Progress Notes (Signed)
 PHARMACY - PHYSICIAN COMMUNICATION CRITICAL VALUE ALERT - BLOOD CULTURE IDENTIFICATION (BCID)  Rick Williams is an 58 y.o. male who presented to [facility] on 10/26/2023   Assessment:  BCID originally called in as [facility]- lab called to update an now saying no growth.  Name of physician (or Provider) Contacted: Dr. Cheryle  Current antibiotics: Vancomycin   Changes to prescribed antibiotics recommended:  Recommendations accepted by provider- discontinue vancomycin  since no positive blood cultures anymore  Results for orders placed or performed during the hospital encounter of 10/26/23  Blood Culture ID Panel (Reflexed) (Collected: 10/26/2023 10:35 AM)  Result Value Ref Range   Enterococcus faecalis NOT DETECTED NOT DETECTED   Enterococcus Faecium NOT DETECTED NOT DETECTED   Listeria monocytogenes NOT DETECTED NOT DETECTED   Staphylococcus species NOT DETECTED NOT DETECTED   Staphylococcus aureus (BCID) NOT DETECTED NOT DETECTED   Staphylococcus epidermidis NOT DETECTED NOT DETECTED   Staphylococcus lugdunensis NOT DETECTED NOT DETECTED   Streptococcus species NOT DETECTED NOT DETECTED   Streptococcus agalactiae NOT DETECTED NOT DETECTED   Streptococcus pneumoniae NOT DETECTED NOT DETECTED   Streptococcus pyogenes NOT DETECTED NOT DETECTED   A.calcoaceticus-baumannii NOT DETECTED NOT DETECTED   Bacteroides fragilis NOT DETECTED NOT DETECTED   Enterobacterales NOT DETECTED NOT DETECTED   Enterobacter cloacae complex NOT DETECTED NOT DETECTED   Escherichia coli NOT DETECTED NOT DETECTED   Klebsiella aerogenes NOT DETECTED NOT DETECTED   Klebsiella oxytoca NOT DETECTED NOT DETECTED   Klebsiella pneumoniae NOT DETECTED NOT DETECTED   Proteus species NOT DETECTED NOT DETECTED   Salmonella species NOT DETECTED NOT DETECTED   Serratia marcescens NOT DETECTED NOT DETECTED   Haemophilus influenzae NOT DETECTED NOT DETECTED   Neisseria meningitidis NOT DETECTED NOT DETECTED   Pseudomonas  aeruginosa NOT DETECTED NOT DETECTED   Stenotrophomonas maltophilia NOT DETECTED NOT DETECTED   Candida albicans NOT DETECTED NOT DETECTED   Candida auris NOT DETECTED NOT DETECTED   Candida glabrata NOT DETECTED NOT DETECTED   Candida krusei NOT DETECTED NOT DETECTED   Candida parapsilosis NOT DETECTED NOT DETECTED   Candida tropicalis NOT DETECTED NOT DETECTED   Cryptococcus neoformans/gattii NOT DETECTED NOT DETECTED    [name] Sour 10/28/2023  11:58 AM

## 2023-10-28 NOTE — Interval H&P Note (Signed)
 History and Physical Interval Note:  10/28/2023 4:30 PM  Rick Williams  has presented today for surgery, with the diagnosis of shock.  The various methods of treatment have been discussed with the patient and family. After consideration of risks, benefits and other options for treatment, the patient has consented to  Procedure(s): RIGHT HEART CATH (N/A) as a surgical intervention.  The patient's history has been reviewed, patient examined, no change in status, stable for surgery.  I have reviewed the patient's chart and labs.  Questions were answered to the patient's satisfaction.     Latessa Tillis

## 2023-10-28 NOTE — Progress Notes (Signed)
 Patient left floor via transport at 1505. Called report to Salome, Charity fundraiser at [employer]. All questions and concerns answered.

## 2023-10-29 DIAGNOSIS — I50813 Acute on chronic right heart failure: Secondary | ICD-10-CM | POA: Diagnosis not present

## 2023-10-29 DIAGNOSIS — N189 Chronic kidney disease, unspecified: Secondary | ICD-10-CM

## 2023-10-29 DIAGNOSIS — N179 Acute kidney failure, unspecified: Secondary | ICD-10-CM | POA: Diagnosis not present

## 2023-10-29 DIAGNOSIS — I5031 Acute diastolic (congestive) heart failure: Secondary | ICD-10-CM | POA: Diagnosis not present

## 2023-10-29 DIAGNOSIS — I272 Pulmonary hypertension, unspecified: Secondary | ICD-10-CM | POA: Diagnosis not present

## 2023-10-29 LAB — GLUCOSE, CAPILLARY
Glucose-Capillary: 127 mg/dL — ABNORMAL HIGH (ref 70–99)
Glucose-Capillary: 127 mg/dL — ABNORMAL HIGH (ref 70–99)

## 2023-10-29 LAB — BASIC METABOLIC PANEL
Anion gap: 12 (ref 5–15)
BUN: 50 mg/dL — ABNORMAL HIGH (ref 6–20)
CO2: 22 mmol/L (ref 22–32)
Calcium: 8.7 mg/dL — ABNORMAL LOW (ref 8.9–10.3)
Chloride: 103 mmol/L (ref 98–111)
Creatinine, Ser: 1.75 mg/dL — ABNORMAL HIGH (ref 0.61–1.24)
GFR, Estimated: 45 mL/min — ABNORMAL LOW (ref >60)
Glucose, Bld: 125 mg/dL — ABNORMAL HIGH (ref 70–99)
Potassium: 4.4 mmol/L (ref 3.5–5.1)
Sodium: 137 mmol/L (ref 135–145)

## 2023-10-29 LAB — HEPATITIS A ANTIBODY, IGM: Hep A IgM: NONREACTIVE

## 2023-10-29 LAB — COOXEMETRY PANEL
Carboxyhemoglobin: 1 % (ref 0.5–1.5)
Methemoglobin: 0.7 % (ref 0.0–1.5)
O2 Saturation: 55 %
Total hemoglobin: 11 g/dL — ABNORMAL LOW (ref 12.0–16.0)

## 2023-10-29 LAB — HEMOGLOBIN A1C
Hgb A1c MFr Bld: 6.9 % — ABNORMAL HIGH (ref 4.8–5.6)
Mean Plasma Glucose: 151.33 mg/dL

## 2023-10-29 LAB — HIV ANTIBODY (ROUTINE TESTING W REFLEX): HIV Screen 4th Generation wRfx: NONREACTIVE

## 2023-10-29 LAB — MAGNESIUM: Magnesium: 2.6 mg/dL — ABNORMAL HIGH (ref 1.7–2.4)

## 2023-10-29 LAB — SEDIMENTATION RATE: Sed Rate: 72 mm/h — ABNORMAL HIGH (ref 0–16)

## 2023-10-29 LAB — HEPATITIS C ANTIBODY: HCV Ab: NONREACTIVE

## 2023-10-29 LAB — HEPATITIS B CORE ANTIBODY, IGM: Hep B C IgM: NONREACTIVE

## 2023-10-29 MED ORDER — CHLORHEXIDINE GLUCONATE CLOTH 2 % EX PADS
6.0000 | MEDICATED_PAD | Freq: Every day | CUTANEOUS | Status: DC
  Administered 2023-10-29 – 2023-11-02 (×5): 6 via TOPICAL

## 2023-10-29 MED ORDER — SILDENAFIL CITRATE 20 MG PO TABS
20.0000 mg | ORAL_TABLET | Freq: Three times a day (TID) | ORAL | Status: DC
  Administered 2023-10-29 – 2023-11-02 (×11): 20 mg via ORAL
  Filled 2023-10-29 (×14): qty 1

## 2023-10-29 MED ORDER — SODIUM CHLORIDE 0.9% FLUSH
10.0000 mL | Freq: Two times a day (BID) | INTRAVENOUS | Status: DC
Start: 2023-10-29 — End: 2023-11-02
  Administered 2023-10-29: 10 mL
  Administered 2023-10-29 – 2023-10-30 (×2): 40 mL
  Administered 2023-10-30 – 2023-10-31 (×2): 10 mL
  Administered 2023-11-01: 20 mL
  Administered 2023-11-02: 10 mL

## 2023-10-29 MED ORDER — INSULIN ASPART 100 UNIT/ML IJ SOLN: 0.0000 [IU] | Freq: Three times a day (TID) | INTRAMUSCULAR | Status: DC

## 2023-10-29 MED ORDER — SODIUM CHLORIDE 0.9% FLUSH: 10.0000 mL | INTRAVENOUS | Status: DC | PRN

## 2023-10-29 NOTE — Progress Notes (Signed)
 Peripherally Inserted Central Catheter Placement  The IV Nurse has discussed with the patient and/or persons authorized to consent for the patient, the purpose of this procedure and the potential benefits and risks involved with this procedure.  The benefits include less needle sticks, lab draws from the catheter, and the patient may be discharged home with the catheter. Risks include, but not limited to, infection, bleeding, blood clot (thrombus formation), and puncture of an artery; nerve damage and irregular heartbeat and possibility to perform a PICC exchange if needed/ordered by physician.  Alternatives to this procedure were also discussed.  Bard Power PICC patient education guide, fact sheet on infection prevention and patient information card has been provided to patient /or left at bedside.    PICC Placement Documentation  PICC Double Lumen 10/29/23 Right Brachial 37 cm 0 cm (Active)  Indication for Insertion or Continuance of Line Vasoactive infusions 10/29/23 0855  Exposed Catheter (cm) 0 cm 10/29/23 0855  Site Assessment Clean, Dry, Intact 10/29/23 0855  Lumen #1 Status Flushed;Saline locked;Blood return noted 10/29/23 0855  Lumen #2 Status Flushed;Saline locked;Blood return noted 10/29/23 0855  Dressing Type Transparent;Securing device 10/29/23 0855  Dressing Status Antimicrobial disc/dressing in place;Clean, Dry, Intact 10/29/23 0855  Line Care Connections checked and tightened 10/29/23 0855  Line Adjustment (NICU/IV Team Only) No 10/29/23 0855  Dressing Intervention New dressing;Adhesive placed at insertion site (IV team only);Adhesive placed around edges of dressing (IV team/ICU RN only) 10/29/23 0855  Dressing Change Due 11/05/23 10/29/23 0855       Rick Williams 10/29/2023, 8:56 AM

## 2023-10-29 NOTE — Plan of Care (Signed)

## 2023-10-29 NOTE — Progress Notes (Addendum)
 PROGRESS NOTE   Rick Williams  FMW:[id number]    DOB: [birth year]    DOA: 10/26/2023  PCP: Patient, No Pcp Per   I have briefly reviewed patients previous medical records in [facility].  Chief Complaint  Patient presents with   Shortness of Breath    Brief Hospital Course:  58 year old currently incarcerated male, medical history significant for HTN, COPD, prior tobacco and alcohol use disorder, CVA 2007, cerebral hemorrhage 2014, chronic diastolic CHF, hypothyroidism and prediabetes, initially presented to the [facility] with complaints of difficulty breathing and cough over the past several days.  He reportedly has been at the [facility] jail since 05/22/2023.  On presentation, creatinine 1.7, troponin 25 and BNP 2518.  Admitted for pulmonary hypertension, acute right heart failure, pericardial effusion.  Transferred to [facility] 2/7, evaluated by Advanced Heart failure cardiology, underwent RHC, initiated on IV Lasix  and milrinone  drip.   Assessment & Plan:  Principal Problem:   Acute diastolic CHF (congestive heart failure) (HCC) Active Problems:   AKI (acute kidney injury) (HCC)   Essential hypertension, malignant   Alcohol abuse   Severe pulmonary hypertension (HCC)   Acute on chronic right heart failure/severe pulmonary hypertension Presented with worsening dyspnea and BNP of 2518 2D echo: LVEF 65 to 70%, severe asymmetric LVH, grade 1 diastolic dysfunction.  Interventricular septum is flattened in systole and diastole, consistent with right ventricular pressure and volume overload.  RV systolic function severely reduced.  Moderate pericardial effusion without cardiac tamponade.  Severe pulmonary hypertension CTA chest: No PE. RHC 2/7: Severely reduced cardiac output & index by Fick & TD. Severely elevated PVR.  Remains on IV Lasix  80 mg twice daily.  -2.2 L thus far.  Weight down from 154 on admission to 131 pounds. Also on digoxin  0.125 Mg  daily, sildenafil  10 Mg 3 times daily for pulmonary hypertension and milrinone  drip.  Midodrine  added by cardiology.  Hypoxia without acute respiratory failure Does have hypoxia but acute respiratory failure ruled out, did not meet criteria No sustained tachypnea or respiratory distress Multifactorial due to acute right heart failure, pulmonary hypertension, COPD Currently on 1 L/min [place] oxygen and saturating up to 100%, can be weaned off probably  Pericardial effusion Seen on CTA chest and 2D echo, without clinical or imaging tamponade. Management per cardiology  Essential hypertension PTA antihypertensives (amlodipine , carvedilol  and clonidine ) held Controlled.  Hyperlipidemia Continue atorvastatin  40 Mg at bedtime  Hypothyroidism TSH 1.408 Continue levothyroxine  100 mcg daily.  Bacteremia ruled out There was a report called about gram-positive cocci on blood cultures which subsequently was redacted.  Briefly on vancomycin  which was stopped. Blood cultures negative to date.  Hypothermia Upon chart review, the temperature of 94.9 recorded on 2/5 at [facility] ED may have been inaccurate.   No infectious etiology.  TSH normal. Resolved.  COPD exacerbation Versus cardiac asthma Briefly on IV steroids. Resolved.  Acute kidney injury complicating stage II CKD Baseline creatinine in September of last year was in the 1.2-1.4 range. Since hospital admission, creatinine has mostly been in the 1.7 range.  This may be either AKI or progressed CKD. Continue to trend daily BMP.  Avoid nephrotoxins.  Lymphadenopathy CTA chest showed mild mediastinal, bilateral hilar, left axillary and gastrohepatic ligament lymphadenopathy which was reported as nonspecific Unclear etiology Recommend outpatient follow-up as deemed necessary.  History of alcohol and tobacco use disorder Patient denies recent use and has been incarcerated since 9/1.  Anemia of chronic disease Mild.  Stable in the 12 g  range.  Prolonged QTc EKG 2/5: QTc 502 ms Maintain K >4 and magnesium  >2 Minimize QT prolonging medications.  Type 2 diabetes mellitus A1c 05/26/2023: 6.7. Recheck A1c and initiate SSI.  Body mass index is 18.79 kg/m.   DVT prophylaxis: heparin  injection 5,000 Units Start: 10/26/23 2200     Code Status: Full Code:  Family Communication: No family at bedside. Disposition:  Status is: Inpatient Remains inpatient appropriate because: Ongoing IV Lasix  and IV milrinone  drip     Consultants:   Cardiology  Procedures:   RHC 2/7 RUE PICC line 2/8 per cardiology  Antimicrobials:      Subjective:  Patient reports that he feels better.  Dyspnea has resolved but patient has not been out of bed.  As discussed with his guarding police officer at bedside, he is able to ambulate with their supervision-discussed with RN to mobilize him.  No chest pain or cough.  Objective:   Vitals:   10/29/23 0207 10/29/23 0400 10/29/23 0800 10/29/23 1127  BP:  (!) 151/91 (!) 138/97   Pulse: 89  96   Resp: 15 17 12    Temp:   97.8 F (36.6 C) 97.9 F (36.6 C)  TempSrc:   Oral Oral  SpO2: 92% 92% 98%   Weight:  59.4 kg    Height:        General exam: Young male, moderately built and frail, chronically ill looking lying comfortably propped up in bed without distress. Respiratory system: Clear to auscultation. Respiratory effort normal. Cardiovascular system: S1 & S2 heard, RRR. No JVD, murmurs, rubs, gallops or clicks. No pedal edema.  Telemetry personally reviewed: Sinus rhythm with some bigeminy night. Gastrointestinal system: Abdomen is nondistended, soft and nontender. No organomegaly or masses felt. Normal bowel sounds heard. Central nervous system: Alert and oriented. No focal neurological deficits. Extremities: Symmetric 5 x 5 power.  Bilateral legs and cuffs. Skin: No rashes, lesions or ulcers Psychiatry: Judgement and insight appear normal. Mood & affect appropriate.     Data  Reviewed:   I have personally reviewed following labs and imaging studies   CBC: Recent Labs  Lab 10/26/23 0936 10/27/23 0405 10/28/23 1700  WBC 9.8 8.3  --   NEUTROABS 7.4  --   --   HGB 11.8* 12.0* 12.2*  12.2*  HCT 37.1* 37.0* 36.0*  36.0*  MCV 85.3 84.3  --   PLT 281 302  --     Basic Metabolic Panel: Recent Labs  Lab 10/26/23 0935 10/26/23 0936 10/27/23 0405 10/28/23 0421 10/28/23 1700 10/29/23 0108  NA  --  137 135 133* 141  141 137  K  --  4.4 4.0 3.9 4.2  4.2 4.4  CL  --  106 105 105  --  103  CO2  --  20* 19* 18*  --  22  GLUCOSE  --  121* 130* 146*  --  125*  BUN  --  42* 51* 47*  --  50*  CREATININE  --  1.71* 1.73* 1.58*  --  1.75*  CALCIUM   --  9.1 8.6* 8.2*  --  8.7*  MG 2.0  --   --  1.9  --  2.6*    Liver Function Tests: Recent Labs  Lab 10/27/23 0405  AST 23  ALT 15  ALKPHOS 80  BILITOT 0.6  PROT 8.5*  ALBUMIN 2.9*    CBG: No results for input(s): GLUCAP in the last 168 hours.  Microbiology Studies:  Recent Results (from the past 240 hours)  Resp panel by RT-PCR (RSV, Flu A&B, Covid) Anterior Nasal Swab     Status: None   Collection Time: 10/26/23  9:56 AM   Specimen: Anterior Nasal Swab  Result Value Ref Range Status   SARS Coronavirus 2 by RT PCR NEGATIVE NEGATIVE Final    Comment: (NOTE) SARS-CoV-2 target nucleic acids are NOT DETECTED.  The SARS-CoV-2 RNA is generally detectable in upper respiratory specimens during the acute phase of infection. The lowest concentration of SARS-CoV-2 viral copies this assay can detect is 138 copies/mL. A negative result does not preclude SARS-Cov-2 infection and should not be used as the sole basis for treatment or other patient management decisions. A negative result may occur with  improper specimen collection/handling, submission of specimen other than nasopharyngeal swab, presence of viral mutation(s) within the areas targeted by this assay, and inadequate number of  viral copies(<138 copies/mL). A negative result must be combined with clinical observations, patient history, and epidemiological information. The expected result is Negative.  Fact Sheet for Patients:  bloggercourse.com  Fact Sheet for Healthcare Providers:  seriousbroker.it  This test is no t yet approved or cleared by the [place]  FDA and  has been authorized for detection and/or diagnosis of SARS-CoV-2 by FDA under an Emergency Use Authorization (EUA). This EUA will remain  in effect (meaning this test can be used) for the duration of the COVID-19 declaration under Section 564(b)(1) of the Act, 21 U.S.C.section 360bbb-3(b)(1), unless the authorization is terminated  or revoked sooner.       Influenza A by PCR NEGATIVE NEGATIVE Final   Influenza B by PCR NEGATIVE NEGATIVE Final    Comment: (NOTE) The Xpert Xpress SARS-CoV-2/FLU/RSV plus assay is intended as an aid in the diagnosis of influenza from Nasopharyngeal swab specimens and should not be used as a sole basis for treatment. Nasal washings and aspirates are unacceptable for Xpert Xpress SARS-CoV-2/FLU/RSV testing.  Fact Sheet for Patients: bloggercourse.com  Fact Sheet for Healthcare Providers: seriousbroker.it  This test is not yet approved or cleared by the [place]  FDA and has been authorized for detection and/or diagnosis of SARS-CoV-2 by FDA under an Emergency Use Authorization (EUA). This EUA will remain in effect (meaning this test can be used) for the duration of the COVID-19 declaration under Section 564(b)(1) of the Act, 21 U.S.C. section 360bbb-3(b)(1), unless the authorization is terminated or revoked.     Resp Syncytial Virus by PCR NEGATIVE NEGATIVE Final    Comment: (NOTE) Fact Sheet for Patients: bloggercourse.com  Fact Sheet for Healthcare  Providers: seriousbroker.it  This test is not yet approved or cleared by the [place]  FDA and has been authorized for detection and/or diagnosis of SARS-CoV-2 by FDA under an Emergency Use Authorization (EUA). This EUA will remain in effect (meaning this test can be used) for the duration of the COVID-19 declaration under Section 564(b)(1) of the Act, 21 U.S.C. section 360bbb-3(b)(1), unless the authorization is terminated or revoked.  Performed at [facility], [address] [place], [place] [address]   Blood culture (routine x 2)     Status: None   Collection Time: 10/26/23 10:35 AM   Specimen: BLOOD  Result Value Ref Range Status   Specimen Description   Final    BLOOD BLOOD LEFT ARM Performed at [facility], [address], [place], [place] [address]    Special Requests   Final    BOTTLES DRAWN AEROBIC AND ANAEROBIC Blood Culture adequate volume  Performed at [facility], [address] [place], [place] [address]    Culture  Setup Time   Final    NO ORGANISMS SEEN AEROBIC BOTTLE ONLY Gram Stain Report Called to,Read Back By and Verified With: A BEVERLY RN 1914 [phone] K FORSYTH PREVIOUSLY REPORTED AS: GRAM POSITIVE COCCI CORRECTED RESULTS CALLED TO: PHARMD S HALL [phone] AT 1125 BY CM    Culture   Final    NO GROWTH Performed at [facility], [address], [place], [place] [address]    Report Status 10/28/2023 FINAL  Final  Blood culture (routine x 2)     Status: None (Preliminary result)   Collection Time: 10/26/23 10:35 AM   Specimen: BLOOD  Result Value Ref Range Status   Specimen Description BLOOD BLOOD RIGHT ARM AEROBIC BOTTLE ONLY  Final   Special Requests   Final    Blood Culture adequate volume BOTTLES DRAWN AEROBIC ONLY   Culture   Final    NO GROWTH 3 DAYS Performed at [facility], [address] [place], [place] [address]    Report Status PENDING  Incomplete  Blood Culture ID Panel (Reflexed)     Status: None    Collection Time: 10/26/23 10:35 AM  Result Value Ref Range Status   Enterococcus faecalis NOT DETECTED NOT DETECTED Final   Enterococcus Faecium NOT DETECTED NOT DETECTED Final   Listeria monocytogenes NOT DETECTED NOT DETECTED Final   Staphylococcus species NOT DETECTED NOT DETECTED Final   Staphylococcus aureus (BCID) NOT DETECTED NOT DETECTED Final   Staphylococcus epidermidis NOT DETECTED NOT DETECTED Final   Staphylococcus lugdunensis NOT DETECTED NOT DETECTED Final   Streptococcus species NOT DETECTED NOT DETECTED Final   Streptococcus agalactiae NOT DETECTED NOT DETECTED Final   Streptococcus pneumoniae NOT DETECTED NOT DETECTED Final   Streptococcus pyogenes NOT DETECTED NOT DETECTED Final   A.calcoaceticus-baumannii NOT DETECTED NOT DETECTED Final   Bacteroides fragilis NOT DETECTED NOT DETECTED Final   Enterobacterales NOT DETECTED NOT DETECTED Final   Enterobacter cloacae complex NOT DETECTED NOT DETECTED Final   Escherichia coli NOT DETECTED NOT DETECTED Final   Klebsiella aerogenes NOT DETECTED NOT DETECTED Final   Klebsiella oxytoca NOT DETECTED NOT DETECTED Final   Klebsiella pneumoniae NOT DETECTED NOT DETECTED Final   Proteus species NOT DETECTED NOT DETECTED Final   Salmonella species NOT DETECTED NOT DETECTED Final   Serratia marcescens NOT DETECTED NOT DETECTED Final   Haemophilus influenzae NOT DETECTED NOT DETECTED Final   Neisseria meningitidis NOT DETECTED NOT DETECTED Final   Pseudomonas aeruginosa NOT DETECTED NOT DETECTED Final   Stenotrophomonas maltophilia NOT DETECTED NOT DETECTED Final   Candida albicans NOT DETECTED NOT DETECTED Final   Candida auris NOT DETECTED NOT DETECTED Final   Candida glabrata NOT DETECTED NOT DETECTED Final   Candida krusei NOT DETECTED NOT DETECTED Final   Candida parapsilosis NOT DETECTED NOT DETECTED Final   Candida tropicalis NOT DETECTED NOT DETECTED Final   Cryptococcus neoformans/gattii NOT DETECTED NOT DETECTED Final     Comment: Performed at [facility], [address], [place], [place] [address]  Culture, blood (Routine X 2) w Reflex to ID Panel     Status: None (Preliminary result)   Collection Time: 10/27/23  2:17 PM   Specimen: BLOOD  Result Value Ref Range Status   Specimen Description BLOOD BLOOD RIGHT ARM  Final   Special Requests   Final    Blood Culture adequate volume BOTTLES DRAWN AEROBIC ONLY  Culture   Final    NO GROWTH 2 DAYS Performed at [facility], [address] [place], [place] [address]    Report Status PENDING  Incomplete  Culture, blood (Routine X 2) w Reflex to ID Panel     Status: None (Preliminary result)   Collection Time: 10/27/23  2:22 PM   Specimen: BLOOD  Result Value Ref Range Status   Specimen Description BLOOD BLOOD LEFT HAND  Final   Special Requests   Final    Blood Culture results may not be optimal due to an inadequate volume of blood received in culture bottles BOTTLES DRAWN AEROBIC ONLY   Culture   Final    NO GROWTH 2 DAYS Performed at [facility], [address], [place], [place] [address]    Report Status PENDING  Incomplete    Radiology Studies:  CARDIAC CATHETERIZATION Result Date: 10/28/2023 HEMODYNAMICS: RA:   20 mmHg with steep Y descents RV:   85/2-20 mmHg PA:   87/44 mmHg (58 mean) PCWP:  12 mmHg (mean)    Estimated Fick CO/CI   2.8 L/min, 1.6 L/min/m2 Thermodilution CO/CI   2.7 L/min, 1.6 L/min/m2    TPG    46  mmHg     PVR     16 Wood Units PAPi      2.2  IMPRESSION: Severely elevated pre capillary pressures Normal post capillary filling pressures Severely reduced cardiac output & index by Fick & TD Severely elevated PVR. Aditya Sabharwal 5:35 PM  US  EKG SITE RITE Result Date: 10/28/2023 If [facility] image not attached, placement could not be confirmed due to current cardiac rhythm.  ECHOCARDIOGRAM COMPLETE Result Date: 10/27/2023    ECHOCARDIOGRAM REPORT   Patient Name:   Rick Williams Date of Exam: 10/27/2023 Medical Rec #:   [id number]          Height:       69.0 in Accession #:    [id number]         Weight:       154.3 lb Date of Birth:  May 04, 1966          BSA:          1.850 m Patient Age:    57 years           BP:           101/87 mmHg Patient Gender: M                  HR:           64 bpm. Exam Location:  [facility] Procedure: 2D Echo, Cardiac Doppler and Color Doppler Indications:    CHF-Acute Diastolic I50.31  History:        Patient has prior history of Echocardiogram examinations, most                 recent 01/20/2020. CHF; Risk Factors:Hypertension. ETOH abuse.  Sonographer:    Aida Pizza RCS Referring Phys: [phone] EJIROGHENE E EMOKPAE IMPRESSIONS  1. Left ventricular ejection fraction, by estimation, is 65 to 70%. The left ventricle has normal function. The left ventricle has no regional wall motion abnormalities. There is severe asymmetric left ventricular hypertrophy of the inferior and lateral  segments. Left ventricular diastolic parameters are consistent with Grade I diastolic dysfunction (impaired relaxation). There is the interventricular septum is flattened in systole and diastole, consistent with right ventricular pressure and volume overload.  2. Right ventricular systolic function is severely reduced. The right  ventricular size is severely enlarged and heavily trabeculated. Mildly increased right ventricular wall thickness. There is severely elevated pulmonary artery systolic pressure. The estimated right ventricular systolic pressure is 107.9 mmHg.  3. Left atrial size was upper normal.  4. Right atrial size was severely dilated.  5. Moderate pericardial effusion. The pericardial effusion is posterior and lateral to the left ventricle. There is no evidence of cardiac tamponade.  6. The tricuspid valve is abnormal. Tricuspid valve regurgitation is moderate to severe.  7. The aortic valve is tricuspid. Aortic valve regurgitation is not visualized.  8. The inferior vena cava is dilated in size with <50% respiratory  variability, suggesting right atrial pressure of 15 mmHg.  9. The mitral valve is grossly normal. Trivial mitral valve regurgitation. Comparison(s): Prior images reviewed side by side. RV dysfunction and severe pulmonary hypertension are new in comparison. FINDINGS  Left Ventricle: Left ventricular ejection fraction, by estimation, is 65 to 70%. The left ventricle has normal function. The left ventricle has no regional wall motion abnormalities. The left ventricular internal cavity size was small. There is severe asymmetric left ventricular hypertrophy of the inferior and lateral segments. The interventricular septum is flattened in systole and diastole, consistent with right ventricular pressure and volume overload. Left ventricular diastolic parameters are consistent with Grade I diastolic dysfunction (impaired relaxation). Right Ventricle: The right ventricular size is severely enlarged. Mildly increased right ventricular wall thickness. Right ventricular systolic function is severely reduced. There is severely elevated pulmonary artery systolic pressure. The tricuspid regurgitant velocity is 4.82 m/s, and with an assumed right atrial pressure of 15 mmHg, the estimated right ventricular systolic pressure is 107.9 mmHg. Left Atrium: Left atrial size was upper normal. Right Atrium: Right atrial size was severely dilated. Pericardium: A moderately sized pericardial effusion is present. The pericardial effusion is posterior and lateral to the left ventricle. There is no evidence of cardiac tamponade. Mitral Valve: The mitral valve is grossly normal. Trivial mitral valve regurgitation. Tricuspid Valve: The tricuspid valve is abnormal. Tricuspid valve regurgitation is moderate to severe. Aortic Valve: The aortic valve is tricuspid. Aortic valve regurgitation is not visualized. Pulmonic Valve: The pulmonic valve was grossly normal. Pulmonic valve regurgitation is trivial. Aorta: The aortic root is normal in size and  structure. Venous: The inferior vena cava is dilated in size with less than 50% respiratory variability, suggesting right atrial pressure of 15 mmHg. IAS/Shunts: No atrial level shunt detected by color flow Doppler.  LEFT VENTRICLE PLAX 2D LVIDd:         2.80 cm   Diastology LVIDs:         1.70 cm   LV e' medial:    5.33 cm/s LV PW:         1.60 cm   LV E/e' medial:  10.8 LV IVS:        1.20 cm   LV e' lateral:   8.38 cm/s LVOT diam:     2.20 cm   LV E/e' lateral: 6.9 LV SV:         40 LV SV Index:   22 LVOT Area:     3.80 cm  RIGHT VENTRICLE RV S prime:     10.70 cm/s TAPSE (M-mode): 1.4 cm LEFT ATRIUM             Index        RIGHT ATRIUM           Index LA diam:        3.50  cm 1.89 cm/m   RA Area:     36.90 cm LA Vol (A2C):   64.2 ml 34.72 ml/m  RA Volume:   173.00 ml 93.49 ml/m LA Vol (A4C):   56.3 ml 30.43 ml/m LA Biplane Vol: 63.4 ml 34.26 ml/m  AORTIC VALVE LVOT Vmax:   78.30 cm/s LVOT Vmean:  47.400 cm/s LVOT VTI:    0.106 m  AORTA Ao Root diam: 3.20 cm MITRAL VALVE               TRICUSPID VALVE MV Area (PHT): 1.46 cm    TR Peak grad:   92.9 mmHg MV Decel Time: 521 msec    TR Vmax:        482.00 cm/s MV E velocity: 57.80 cm/s MV A velocity: 68.20 cm/s  SHUNTS MV E/A ratio:  0.85        Systemic VTI:  0.11 m                            Systemic Diam: 2.20 cm Jayson Sierras MD Electronically signed by Jayson Sierras MD Signature Date/Time: 10/27/2023/5:15:36 PM    Final     Scheduled Meds:    atorvastatin   40 mg Oral QHS   budesonide  (PULMICORT ) nebulizer solution  0.5 mg Nebulization BID   Chlorhexidine  Gluconate Cloth  6 each Topical Daily   digoxin   0.125 mg Oral Daily   feeding supplement  237 mL Oral BID BM   furosemide   80 mg Intravenous BID   heparin   5,000 Units Subcutaneous Q8H   ipratropium-albuterol   3 mL Nebulization BID   levETIRAcetam   500 mg Oral BID   levothyroxine   100 mcg Oral Daily   midodrine   5 mg Oral TID WC   sildenafil   10 mg Oral TID   sodium chloride  flush   10-40 mL Intracatheter Q12H    Continuous Infusions:    milrinone  0.125 mcg/kg/min (10/29/23 0656)     LOS: 3 days     Trenda Mar, MD,  FACP, [facility], [facility], [facility]   [facility] [place]      To contact the attending provider between 7A-7P or the covering provider during after hours 7P-7A, please log into the web site [email or web address] and access using universal [employer] password for that web site. If you do not have the password, please call the hospital operator.  10/29/2023, 1:08 PM

## 2023-10-29 NOTE — Progress Notes (Signed)
 Patient sat on the side of the bed and had his dinner. Patient stood at the side of the bed and used the bedside commode. Patient refused to ambulate, Araceli Beady, RN

## 2023-10-29 NOTE — Plan of Care (Signed)

## 2023-10-29 NOTE — Progress Notes (Signed)
 Patient's CVP is 14. Araceli Beady, RN

## 2023-10-29 NOTE — Progress Notes (Signed)
 Patient's CVP is 4. Augusto Blonder, RN

## 2023-10-29 NOTE — Consult Note (Signed)
 Advanced Heart Failure Team Consult Note   Primary Physician: Patient, No Pcp Per Cardiologist:  Jayson Sierras, MD  Reason for Consultation: RV Failure and Pulmonary Hypertension  Interval hx  - Feels much better today  - CVP 14 - 5L urine output since yesterday  Objective:    Vital Signs:   Temp:  [97.8 F (36.6 C)-97.9 F (36.6 C)] 97.8 F (36.6 C) (02/08 1627) Pulse Rate:  [67-96] 96 (02/08 0800) Resp:  [12-27] 17 (02/08 1605) BP: (118-151)/(90-101) 138/97 (02/08 0800) SpO2:  [88 %-98 %] 90 % (02/08 1627) Weight:  [59.4 kg] 59.4 kg (02/08 0400) Last BM Date : 10/28/23  Weight change: Filed Weights   10/28/23 0500 10/28/23 1535 10/29/23 0400  Weight: 57.5 kg 58 kg 59.4 kg   Intake/Output:   Intake/Output Summary (Last 24 hours) at 10/29/2023 1834 Last data filed at 10/29/2023 1645 Gross per 24 hour  Intake 922.79 ml  Output 5050 ml  Net -4127.21 ml    Physical Exam    General:  Thin, chronically ill appearing Neck: JVP elevated Cor: Regular rate & rhythm. No rubs, gallops, + TR murmur Lungs: clear Abdomen: soft, nontender, nondistended.  Extremities: no cyanosis, clubbing, rash, edema Neuro: alert & orientedx3. Affect pleasant   EKG    SR 74 bpm  Labs   Basic Metabolic Panel: Recent Labs  Lab 10/26/23 0935 10/26/23 0936 10/26/23 0936 10/27/23 0405 10/28/23 0421 10/28/23 1700 10/29/23 0108  NA  --  137  --  135 133* 141  141 137  K  --  4.4  --  4.0 3.9 4.2  4.2 4.4  CL  --  106  --  105 105  --  103  CO2  --  20*  --  19* 18*  --  22  GLUCOSE  --  121*  --  130* 146*  --  125*  BUN  --  42*  --  51* 47*  --  50*  CREATININE  --  1.71*  --  1.73* 1.58*  --  1.75*  CALCIUM   --  9.1   < > 8.6* 8.2*  --  8.7*  MG 2.0  --   --   --  1.9  --  2.6*   < > = values in this interval not displayed.   Liver Function Tests: Recent Labs  Lab 10/27/23 0405  AST 23  ALT 15  ALKPHOS 80  BILITOT 0.6  PROT 8.5*  ALBUMIN 2.9*   No results  for input(s): LIPASE, AMYLASE in the last 168 hours. No results for input(s): AMMONIA in the last 168 hours.  CBC: Recent Labs  Lab 10/26/23 0936 10/27/23 0405 10/28/23 1700  WBC 9.8 8.3  --   NEUTROABS 7.4  --   --   HGB 11.8* 12.0* 12.2*  12.2*  HCT 37.1* 37.0* 36.0*  36.0*  MCV 85.3 84.3  --   PLT 281 302  --    Cardiac Enzymes: No results for input(s): CKTOTAL, CKMB, CKMBINDEX, TROPONINI in the last 168 hours.  BNP: BNP (last 3 results) Recent Labs    10/26/23 1100  BNP 2,518.0*   ProBNP (last 3 results) No results for input(s): PROBNP in the last 8760 hours.   CBG: Recent Labs  Lab 10/29/23 1644  GLUCAP 127*    Coagulation Studies: No results for input(s): LABPROT, INR in the last 72 hours.  Imaging   No results found.  Medications:    Current Medications:  atorvastatin   40 mg Oral QHS   budesonide  (PULMICORT ) nebulizer solution  0.5 mg Nebulization BID   Chlorhexidine  Gluconate Cloth  6 each Topical Daily   digoxin   0.125 mg Oral Daily   feeding supplement  237 mL Oral BID BM   furosemide   80 mg Intravenous BID   heparin   5,000 Units Subcutaneous Q8H   insulin  aspart  0-6 Units Subcutaneous TID WC   ipratropium-albuterol   3 mL Nebulization BID   levETIRAcetam   500 mg Oral BID   levothyroxine   100 mcg Oral Daily   midodrine   5 mg Oral TID WC   sildenafil   20 mg Oral TID   sodium chloride  flush  10-40 mL Intracatheter Q12H    Infusions:  milrinone  0.125 mcg/kg/min (10/29/23 0656)   HEMODYNAMICS: RA:                  20 mmHg with steep Y descents RV:                  85/2-20 mmHg PA:                  87/44 mmHg (58 mean) PCWP:            12 mmHg (mean)                                      Estimated Fick CO/CI   2.8 L/min, 1.6 L/min/m2 Thermodilution CO/CI   2.7 L/min, 1.6 L/min/m2                                              TPG                 46  mmHg                                            PVR                  16 Wood Units  PAPi                2.2       IMPRESSION: Severely elevated pre capillary pressures Normal post capillary filling pressures Severely reduced cardiac output & index by Fick & TD Severely elevated PVR.   Patient Profile   Rick Williams is a 58 yo male with PMH of systolic heart failure, hemorraghic stroke 2007, COPD, and hypertension. Admitted with acute hypoxic respiratory failure 2/2 pulmonary hypertension and RV failure.  Assessment/Plan  RV failure Pulmonary Hypertension -New diagnosis; likely Group 1 + 3 -Hypoxic with sats in 70s on presentation -Echo EF 65-70%, severe asymmetric LVH inferior and lateral segments, RV severely enlarged and severely reduced, RVSP 108 mmHg, severe RAE, moderate pericardial effusion, moderate to severe TR - Eventual V/Q scan, PFTs, possible HiRes CT -Sleep study as outpatient but will depend on timing of release from jail (has upcoming court date) - CTD serologies pending - RHC yesterday with severely pre-capillary filling pressures, reduced CI and PVR of 16 Wood units.  - Tolerating sildenafil  very well; increase to 20mg  TID - Continue diuresis with IV lasix  80mg  BID.  - Symptomatically feeling  much better.   Pericardial Effusion - Moderate in severity on echo 2/6, no evidence of tamponade - marker of poor prognosis.   AKI on CKD Stage 2 - baseline Cr ~1.3 - sCr 1.75 today; continue diuresis.   Tricuspid Regurgitation - in the setting of RV Failure. RV and RA severely dilated - moderate to severe on echo  COPD - consider stopping Solu-Medrol , presentation more likely d/t pulmonary hypertension/RV failure rather than COPDE - continue nebs per primary  SDOH: Admitted from [facility] jail. Will be going back to jail after discharge. It is unclear if he had insurance prior to incarceration. He believes he may have medicaid. Will consult HF TOC CM/SW.  Length of Stay: 3  Brei Pociask, DO  10/29/2023, 6:34  PM  Advanced Heart Failure Team Pager [phone] (M-F; 7a - 5p)  Please contact [facility] for night-coverage after hours (4p -7a ) and weekends on [email or web address]

## 2023-10-30 DIAGNOSIS — I272 Pulmonary hypertension, unspecified: Secondary | ICD-10-CM | POA: Diagnosis not present

## 2023-10-30 DIAGNOSIS — N179 Acute kidney failure, unspecified: Secondary | ICD-10-CM | POA: Diagnosis not present

## 2023-10-30 DIAGNOSIS — N189 Chronic kidney disease, unspecified: Secondary | ICD-10-CM | POA: Diagnosis not present

## 2023-10-30 DIAGNOSIS — I5031 Acute diastolic (congestive) heart failure: Secondary | ICD-10-CM | POA: Diagnosis not present

## 2023-10-30 DIAGNOSIS — I50813 Acute on chronic right heart failure: Secondary | ICD-10-CM | POA: Diagnosis not present

## 2023-10-30 LAB — BASIC METABOLIC PANEL
Anion gap: 11 (ref 5–15)
BUN: 57 mg/dL — ABNORMAL HIGH (ref 6–20)
CO2: 28 mmol/L (ref 22–32)
Calcium: 8.9 mg/dL (ref 8.9–10.3)
Chloride: 97 mmol/L — ABNORMAL LOW (ref 98–111)
Creatinine, Ser: 1.81 mg/dL — ABNORMAL HIGH (ref 0.61–1.24)
GFR, Estimated: 43 mL/min — ABNORMAL LOW (ref >60)
Glucose, Bld: 118 mg/dL — ABNORMAL HIGH (ref 70–99)
Potassium: 3.5 mmol/L (ref 3.5–5.1)
Sodium: 136 mmol/L (ref 135–145)

## 2023-10-30 LAB — GLUCOSE, CAPILLARY
Glucose-Capillary: 111 mg/dL — ABNORMAL HIGH (ref 70–99)
Glucose-Capillary: 99 mg/dL (ref 70–99)
Glucose-Capillary: 88 mg/dL (ref 70–99)
Glucose-Capillary: 118 mg/dL — ABNORMAL HIGH (ref 70–99)

## 2023-10-30 LAB — RHEUMATOID FACTOR: Rheumatoid fact SerPl-aCnc: 10.3 [IU]/mL (ref <14.0)

## 2023-10-30 LAB — MAGNESIUM: Magnesium: 2.1 mg/dL (ref 1.7–2.4)

## 2023-10-30 LAB — ANA W/REFLEX IF POSITIVE: Anti Nuclear Antibody (ANA): NEGATIVE

## 2023-10-30 LAB — COOXEMETRY PANEL
Carboxyhemoglobin: 1.3 % (ref 0.5–1.5)
Methemoglobin: 0.7 % (ref 0.0–1.5)
O2 Saturation: 55.4 %
Total hemoglobin: 13.5 g/dL (ref 12.0–16.0)

## 2023-10-30 LAB — ANTI-SCLERODERMA ANTIBODY: Scleroderma (Scl-70) (ENA) Antibody, IgG: <0.2 AI (ref 0.0–0.9)

## 2023-10-30 LAB — CENTROMERE ANTIBODIES: Centromere Ab Screen: <0.2 AI (ref 0.0–0.9)

## 2023-10-30 MED ORDER — POTASSIUM CHLORIDE CRYS ER 20 MEQ PO TBCR
40.0000 meq | EXTENDED_RELEASE_TABLET | Freq: Once | ORAL | Status: AC
  Administered 2023-10-30: 40 meq via ORAL
  Filled 2023-10-30: qty 2

## 2023-10-30 MED ORDER — MIDODRINE HCL 5 MG PO TABS
2.5000 mg | ORAL_TABLET | Freq: Three times a day (TID) | ORAL | Status: DC
  Administered 2023-10-30 – 2023-10-31 (×2): 2.5 mg via ORAL
  Filled 2023-10-30 (×2): qty 1

## 2023-10-30 NOTE — Plan of Care (Signed)

## 2023-10-30 NOTE — Progress Notes (Signed)
 Advanced Heart Failure Team Consult Note   Primary Physician: Patient, No Pcp Per Cardiologist:  Jayson Sierras, MD  Reason for Consultation: RV Failure and Pulmonary Hypertension  Interval hx  - 4.5L urine output yesterday; additional 1L today - Mixed venous 55%, sCr up to 1.8 today - CVP 5-6  Objective:    Vital Signs:   Temp:  [97.4 F (36.3 C)-98.1 F (36.7 C)] 97.8 F (36.6 C) (02/09 1115) Pulse Rate:  [75-105] 94 (02/09 1115) Resp:  [17-20] 18 (02/09 1115) BP: (106-127)/(79-90) 116/90 (02/09 1115) SpO2:  [90 %-95 %] 91 % (02/09 1115) Weight:  [51.2 kg] 51.2 kg (02/09 0408) Last BM Date : 10/28/23  Weight change: Filed Weights   10/28/23 1535 10/29/23 0400 10/30/23 0408  Weight: 58 kg 59.4 kg 51.2 kg   Intake/Output:   Intake/Output Summary (Last 24 hours) at 10/30/2023 1353 Last data filed at 10/30/2023 1117 Gross per 24 hour  Intake 300 ml  Output 3950 ml  Net -3650 ml    Physical Exam    General: chronically ill appearing; no complaints.  Neck: JVP 8-10 Cor: RRR; SM 2/2 TR Lungs: clear Abdomen: soft, nontender, nondistended.  Extremities: no edema Neuro: alert & orientedx3. Affect pleasant   EKG    SR 74 bpm  Labs   Basic Metabolic Panel: Recent Labs  Lab 10/26/23 0935 10/26/23 0936 10/26/23 0936 10/27/23 0405 10/28/23 0421 10/28/23 1700 10/29/23 0108 10/30/23 0418  NA  --  137   < > 135 133* 141  141 137 136  K  --  4.4   < > 4.0 3.9 4.2  4.2 4.4 3.5  CL  --  106  --  105 105  --  103 97*  CO2  --  20*  --  19* 18*  --  22 28  GLUCOSE  --  121*  --  130* 146*  --  125* 118*  BUN  --  42*  --  51* 47*  --  50* 57*  CREATININE  --  1.71*  --  1.73* 1.58*  --  1.75* 1.81*  CALCIUM   --  9.1   < > 8.6* 8.2*  --  8.7* 8.9  MG 2.0  --   --   --  1.9  --  2.6* 2.1   < > = values in this interval not displayed.   Liver Function Tests: Recent Labs  Lab 10/27/23 0405  AST 23  ALT 15  ALKPHOS 80  BILITOT 0.6  PROT 8.5*  ALBUMIN  2.9*   No results for input(s): LIPASE, AMYLASE in the last 168 hours. No results for input(s): AMMONIA in the last 168 hours.  CBC: Recent Labs  Lab 10/26/23 0936 10/27/23 0405 10/28/23 1700  WBC 9.8 8.3  --   NEUTROABS 7.4  --   --   HGB 11.8* 12.0* 12.2*  12.2*  HCT 37.1* 37.0* 36.0*  36.0*  MCV 85.3 84.3  --   PLT 281 302  --    Cardiac Enzymes: No results for input(s): CKTOTAL, CKMB, CKMBINDEX, TROPONINI in the last 168 hours.  BNP: BNP (last 3 results) Recent Labs    10/26/23 1100  BNP 2,518.0*   ProBNP (last 3 results) No results for input(s): PROBNP in the last 8760 hours.   CBG: Recent Labs  Lab 10/29/23 1644 10/29/23 2054 10/30/23 0541 10/30/23 1114  GLUCAP 127* 127* 111* 99    Coagulation Studies: No results for input(s): LABPROT, INR in  the last 72 hours.  Imaging   No results found.  Medications:    Current Medications:  atorvastatin   40 mg Oral QHS   budesonide  (PULMICORT ) nebulizer solution  0.5 mg Nebulization BID   Chlorhexidine  Gluconate Cloth  6 each Topical Daily   digoxin   0.125 mg Oral Daily   feeding supplement  237 mL Oral BID BM   furosemide   80 mg Intravenous BID   heparin   5,000 Units Subcutaneous Q8H   insulin  aspart  0-6 Units Subcutaneous TID WC   ipratropium-albuterol   3 mL Nebulization BID   levETIRAcetam   500 mg Oral BID   levothyroxine   100 mcg Oral Daily   midodrine   5 mg Oral TID WC   sildenafil   20 mg Oral TID   sodium chloride  flush  10-40 mL Intracatheter Q12H    Infusions:  milrinone  0.125 mcg/kg/min (10/30/23 1350)   HEMODYNAMICS: RA:                  20 mmHg with steep Y descents RV:                  85/2-20 mmHg PA:                  87/44 mmHg (58 mean) PCWP:            12 mmHg (mean)                                      Estimated Fick CO/CI   2.8 L/min, 1.6 L/min/m2 Thermodilution CO/CI   2.7 L/min, 1.6 L/min/m2                                              TPG                  46  mmHg                                            PVR                 16 Wood Units  PAPi                2.2       IMPRESSION: Severely elevated pre capillary pressures Normal post capillary filling pressures Severely reduced cardiac output & index by Fick & TD Severely elevated PVR.   Patient Profile   Rick Williams is a 58 yo male with PMH of systolic heart failure, hemorraghic stroke 2007, COPD, and hypertension. Admitted with acute hypoxic respiratory failure 2/2 pulmonary hypertension and RV failure.  Assessment/Plan  RV failure Pulmonary Hypertension -New diagnosis; likely Group 1 + 3 -Hypoxic with sats in 70s on presentation -Echo EF 65-70%, severe asymmetric LVH inferior and lateral segments, RV severely enlarged and severely reduced, RVSP 108 mmHg, severe RAE, moderate pericardial effusion, moderate to severe TR - Eventual V/Q scan, PFTs, possible HiRes CT -Sleep study as outpatient but will depend on timing of release from jail (has upcoming court date) - CTD serologies pending - RHC with severely pre-capillary filling pressures, reduced CI and PVR of 16 Wood units.  - Tolerating sildenafil   very well; continue 20mg  TID; will plan to wean off milrinoen tomorrow.  - rise in sCr and Bun; will hold PM dose of lasix .  - SBP now >110 with addition of sildenafil  20mg  TID; will decrease midodrine  to 2.5mg  TID.  - Plan to wean off milrinone  tomorrow.   Pericardial Effusion - Moderate in severity on echo 2/6, no evidence of tamponade - marker of poor prognosis.   AKI on CKD Stage 2 - baseline Cr ~1.3 - sCr 1.75 yesterday; up to 1.8 today.   Tricuspid Regurgitation - in the setting of RV Failure. RV and RA severely dilated - moderate to severe on echo  COPD - consider stopping Solu-Medrol , presentation more likely d/t pulmonary hypertension/RV failure rather than COPDE - continue nebs per primary  SDOH: Admitted from [facility] jail. Will be going back to  jail after discharge. It is unclear if he had insurance prior to incarceration. He believes he may have medicaid. Will consult HF TOC CM/SW.  Length of Stay: 4  Caelen Higinbotham, DO  10/30/2023, 1:53 PM  Advanced Heart Failure Team Pager [phone] (M-F; 7a - 5p)  Please contact [facility] for night-coverage after hours (4p -7a ) and weekends on [email or web address]

## 2023-10-30 NOTE — Progress Notes (Signed)
 PROGRESS NOTE   Rick Williams  FMW:[id number]    DOB: [birth year]    DOA: 10/26/2023  PCP: Patient, No Pcp Per   I have briefly reviewed patients previous medical records in [facility].  Chief Complaint  Patient presents with   Shortness of Breath    Brief Hospital Course:  58 year old currently incarcerated male, medical history significant for HTN, COPD, prior tobacco and alcohol use disorder, CVA 2007, cerebral hemorrhage 2014, chronic diastolic CHF, hypothyroidism and prediabetes, initially presented to the [facility] with complaints of difficulty breathing and cough over the past several days.  He reportedly has been at the [facility] jail since 05/22/2023.  On presentation, creatinine 1.7, troponin 25 and BNP 2518.  Admitted for pulmonary hypertension, acute right heart failure, pericardial effusion.  Transferred to [facility] 2/7, evaluated by Advanced Heart failure cardiology, underwent RHC, initiated on IV Lasix  and milrinone  drip.    Care primarily per Cardiology at this time.   Assessment & Plan:  Principal Problem:   Acute diastolic CHF (congestive heart failure) (HCC) Active Problems:   AKI (acute kidney injury) (HCC)   Essential hypertension, malignant   Alcohol abuse   Severe pulmonary hypertension (HCC)   Acute on chronic right heart failure/severe pulmonary hypertension Presented with worsening dyspnea and BNP of 2518 2D echo: LVEF 65 to 70%, severe asymmetric LVH, grade 1 diastolic dysfunction.  Interventricular septum is flattened in systole and diastole, consistent with right ventricular pressure and volume overload.  RV systolic function severely reduced.  Moderate pericardial effusion without cardiac tamponade.  Severe pulmonary hypertension CTA chest: No PE. RHC 2/7: Severely reduced cardiac output & index by Fick & TD. Severely elevated PVR.  Remains on IV Lasix  80 mg twice daily.  -6.1 L thus far.  Weight down from 154 on  admission to 113 pounds. Also on digoxin  0.125 Mg daily, sildenafil  20 Mg 3 times daily for pulmonary hypertension and milrinone  drip.  Midodrine  added by cardiology.  Hypoxia without acute respiratory failure Does have hypoxia but acute respiratory failure ruled out, did not meet criteria No sustained tachypnea or respiratory distress Multifactorial due to acute right heart failure, pulmonary hypertension, COPD Currently on 1 L/min [place] oxygen and saturating up to 100%, can be weaned off probably-messaged RN.  Pericardial effusion Seen on CTA chest and 2D echo, without clinical or imaging tamponade. Management per cardiology  Essential hypertension PTA antihypertensives (amlodipine , carvedilol  and clonidine ) held Controlled.  Hyperlipidemia Continue atorvastatin  40 Mg at bedtime  Hypothyroidism TSH 1.408 Continue levothyroxine  100 mcg daily.  Bacteremia ruled out There was a report called about gram-positive cocci on blood cultures which subsequently was redacted.  Briefly on vancomycin  which was stopped. Blood cultures negative to date.  Hypothermia Upon chart review, the temperature of 94.9 recorded on 2/5 at [facility] ED may have been inaccurate.   No infectious etiology.  TSH normal. Resolved.  COPD exacerbation Versus cardiac asthma Briefly on IV steroids. Resolved.  Acute kidney injury complicating stage II CKD Baseline creatinine in September of last year was in the 1.2-1.4 range. Since hospital admission, creatinine has mostly been in the 1.7 range.  This may be either AKI or progressed CKD.  Creatinine up to 1.81. Continue to trend daily BMP.  Avoid nephrotoxins.  Lymphadenopathy CTA chest showed mild mediastinal, bilateral hilar, left axillary and gastrohepatic ligament lymphadenopathy which was reported as nonspecific Unclear etiology Recommend outpatient follow-up as deemed necessary.  History of alcohol and tobacco use disorder Patient  denies recent use and has  been incarcerated since 9/1.  Anemia of chronic disease Mild.  Stable in the 12 g range.  Prolonged QTc EKG 2/5: QTc 502 ms Maintain K >4 and magnesium  >2.  Potassium 3.5, replaced. Minimize QT prolonging medications.  Type 2 diabetes mellitus A1c 2/8: 6.9. Recheck A1c and initiate SSI.  Reasonable inpatient control.  Body mass index is 16.19 kg/m.   DVT prophylaxis: heparin  injection 5,000 Units Start: 10/26/23 2200     Code Status: Full Code:  Family Communication: No family at bedside. Disposition:  Status is: Inpatient Remains inpatient appropriate because: Ongoing IV Lasix  and IV milrinone  drip     Consultants:   Cardiology  Procedures:   RHC 2/7 RUE PICC line 2/8 per cardiology  Antimicrobials:      Subjective:  Patient not very interactive this morning.  Lying down with eyes closed but appears to be awake.  Denies dyspnea or pain.  Emergency planning/management officer at bedside.  Objective:   Vitals:   10/29/23 1946 10/29/23 2326 10/30/23 0408 10/30/23 0730  BP: 106/79 110/83 110/88 127/85  Pulse: 75 (!) 105  99  Resp: 20 20 18 17   Temp: (!) 97.4 F (36.3 C) 98.1 F (36.7 C) 97.7 F (36.5 C) 97.8 F (36.6 C)  TempSrc: Oral Oral Oral Oral  SpO2: 92% 92% 91% 95%  Weight:   51.2 kg   Height:        General exam: Young male, moderately built and frail, chronically ill looking lying comfortably propped up in bed without distress. Respiratory system: Clear to auscultation.  No increased work of breathing. Cardiovascular system: S1 & S2 heard, RRR. No JVD, murmurs, rubs, gallops or clicks. No pedal edema.  Telemetry personally reviewed: Sinus rhythm with frequent bigeminal. Gastrointestinal system: Abdomen is nondistended, soft and nontender. No organomegaly or masses felt. Normal bowel sounds heard. Central nervous system: Mental status as noted above. No focal neurological deficits. Extremities: Symmetric 5 x 5 power.  Bilateral legs and cuffs. Skin: No rashes, lesions  or ulcers Psychiatry: Judgement and insight appear normal. Mood & affect appropriate.     Data Reviewed:   I have personally reviewed following labs and imaging studies   CBC: Recent Labs  Lab 10/26/23 0936 10/27/23 0405 10/28/23 1700  WBC 9.8 8.3  --   NEUTROABS 7.4  --   --   HGB 11.8* 12.0* 12.2*  12.2*  HCT 37.1* 37.0* 36.0*  36.0*  MCV 85.3 84.3  --   PLT 281 302  --     Basic Metabolic Panel: Recent Labs  Lab 10/26/23 0935 10/26/23 0936 10/26/23 0936 10/27/23 0405 10/28/23 0421 10/28/23 1700 10/29/23 0108 10/30/23 0418  NA  --  137   < > 135 133* 141  141 137 136  K  --  4.4   < > 4.0 3.9 4.2  4.2 4.4 3.5  CL  --  106  --  105 105  --  103 97*  CO2  --  20*  --  19* 18*  --  22 28  GLUCOSE  --  121*  --  130* 146*  --  125* 118*  BUN  --  42*  --  51* 47*  --  50* 57*  CREATININE  --  1.71*  --  1.73* 1.58*  --  1.75* 1.81*  CALCIUM   --  9.1  --  8.6* 8.2*  --  8.7* 8.9  MG 2.0  --   --   --  1.9  --  2.6* 2.1   < > = values in this interval not displayed.    Liver Function Tests: Recent Labs  Lab 10/27/23 0405  AST 23  ALT 15  ALKPHOS 80  BILITOT 0.6  PROT 8.5*  ALBUMIN 2.9*    CBG: Recent Labs  Lab 10/29/23 1644 10/29/23 2054 10/30/23 0541  GLUCAP 127* 127* 111*    Microbiology Studies:   Recent Results (from the past 240 hours)  Resp panel by RT-PCR (RSV, Flu A&B, Covid) Anterior Nasal Swab     Status: None   Collection Time: 10/26/23  9:56 AM   Specimen: Anterior Nasal Swab  Result Value Ref Range Status   SARS Coronavirus 2 by RT PCR NEGATIVE NEGATIVE Final    Comment: (NOTE) SARS-CoV-2 target nucleic acids are NOT DETECTED.  The SARS-CoV-2 RNA is generally detectable in upper respiratory specimens during the acute phase of infection. The lowest concentration of SARS-CoV-2 viral copies this assay can detect is 138 copies/mL. A negative result does not preclude SARS-Cov-2 infection and should not be used as the sole basis  for treatment or other patient management decisions. A negative result may occur with  improper specimen collection/handling, submission of specimen other than nasopharyngeal swab, presence of viral mutation(s) within the areas targeted by this assay, and inadequate number of viral copies(<138 copies/mL). A negative result must be combined with clinical observations, patient history, and epidemiological information. The expected result is Negative.  Fact Sheet for Patients:  bloggercourse.com  Fact Sheet for Healthcare Providers:  seriousbroker.it  This test is no t yet approved or cleared by the [place]  FDA and  has been authorized for detection and/or diagnosis of SARS-CoV-2 by FDA under an Emergency Use Authorization (EUA). This EUA will remain  in effect (meaning this test can be used) for the duration of the COVID-19 declaration under Section 564(b)(1) of the Act, 21 U.S.C.section 360bbb-3(b)(1), unless the authorization is terminated  or revoked sooner.       Influenza A by PCR NEGATIVE NEGATIVE Final   Influenza B by PCR NEGATIVE NEGATIVE Final    Comment: (NOTE) The Xpert Xpress SARS-CoV-2/FLU/RSV plus assay is intended as an aid in the diagnosis of influenza from Nasopharyngeal swab specimens and should not be used as a sole basis for treatment. Nasal washings and aspirates are unacceptable for Xpert Xpress SARS-CoV-2/FLU/RSV testing.  Fact Sheet for Patients: bloggercourse.com  Fact Sheet for Healthcare Providers: seriousbroker.it  This test is not yet approved or cleared by the [place]  FDA and has been authorized for detection and/or diagnosis of SARS-CoV-2 by FDA under an Emergency Use Authorization (EUA). This EUA will remain in effect (meaning this test can be used) for the duration of the COVID-19 declaration under Section 564(b)(1) of the Act, 21  U.S.C. section 360bbb-3(b)(1), unless the authorization is terminated or revoked.     Resp Syncytial Virus by PCR NEGATIVE NEGATIVE Final    Comment: (NOTE) Fact Sheet for Patients: bloggercourse.com  Fact Sheet for Healthcare Providers: seriousbroker.it  This test is not yet approved or cleared by the [place]  FDA and has been authorized for detection and/or diagnosis of SARS-CoV-2 by FDA under an Emergency Use Authorization (EUA). This EUA will remain in effect (meaning this test can be used) for the duration of the COVID-19 declaration under Section 564(b)(1) of the Act, 21 U.S.C. section 360bbb-3(b)(1), unless the authorization is terminated or revoked.  Performed at [facility], [address] [place], [place] [address]  Blood culture (routine x 2)     Status: None   Collection Time: 10/26/23 10:35 AM   Specimen: BLOOD  Result Value Ref Range Status   Specimen Description   Final    BLOOD BLOOD LEFT ARM Performed at [facility], [address] [place], [place] [address]    Special Requests   Final    BOTTLES DRAWN AEROBIC AND ANAEROBIC Blood Culture adequate volume Performed at [facility], [address], [place], [place] [address]    Culture  Setup Time   Final    NO ORGANISMS SEEN AEROBIC BOTTLE ONLY Gram Stain Report Called to,Read Back By and Verified With: DELENA LINE RN 1914 [phone] K FORSYTH PREVIOUSLY REPORTED AS: GRAM POSITIVE COCCI CORRECTED RESULTS CALLED TO: PHARMD S HALL [id number] AT 1125 BY CM    Culture   Final    NO GROWTH Performed at [facility], [address] [place], [place] [address]    Report Status 10/28/2023 FINAL  Final  Blood culture (routine x 2)     Status: None (Preliminary result)   Collection Time: 10/26/23 10:35 AM   Specimen: BLOOD  Result Value Ref Range Status   Specimen Description BLOOD BLOOD RIGHT ARM AEROBIC BOTTLE ONLY  Final   Special Requests   Final    Blood  Culture adequate volume BOTTLES DRAWN AEROBIC ONLY   Culture   Final    NO GROWTH 4 DAYS Performed at [facility], [address], [place], [place] [address]    Report Status PENDING  Incomplete  Blood Culture ID Panel (Reflexed)     Status: None   Collection Time: 10/26/23 10:35 AM  Result Value Ref Range Status   Enterococcus faecalis NOT DETECTED NOT DETECTED Final   Enterococcus Faecium NOT DETECTED NOT DETECTED Final   Listeria monocytogenes NOT DETECTED NOT DETECTED Final   Staphylococcus species NOT DETECTED NOT DETECTED Final   Staphylococcus aureus (BCID) NOT DETECTED NOT DETECTED Final   Staphylococcus epidermidis NOT DETECTED NOT DETECTED Final   Staphylococcus lugdunensis NOT DETECTED NOT DETECTED Final   Streptococcus species NOT DETECTED NOT DETECTED Final   Streptococcus agalactiae NOT DETECTED NOT DETECTED Final   Streptococcus pneumoniae NOT DETECTED NOT DETECTED Final   Streptococcus pyogenes NOT DETECTED NOT DETECTED Final   A.calcoaceticus-baumannii NOT DETECTED NOT DETECTED Final   Bacteroides fragilis NOT DETECTED NOT DETECTED Final   Enterobacterales NOT DETECTED NOT DETECTED Final   Enterobacter cloacae complex NOT DETECTED NOT DETECTED Final   Escherichia coli NOT DETECTED NOT DETECTED Final   Klebsiella aerogenes NOT DETECTED NOT DETECTED Final   Klebsiella oxytoca NOT DETECTED NOT DETECTED Final   Klebsiella pneumoniae NOT DETECTED NOT DETECTED Final   Proteus species NOT DETECTED NOT DETECTED Final   Salmonella species NOT DETECTED NOT DETECTED Final   Serratia marcescens NOT DETECTED NOT DETECTED Final   Haemophilus influenzae NOT DETECTED NOT DETECTED Final   Neisseria meningitidis NOT DETECTED NOT DETECTED Final   Pseudomonas aeruginosa NOT DETECTED NOT DETECTED Final   Stenotrophomonas maltophilia NOT DETECTED NOT DETECTED Final   Candida albicans NOT DETECTED NOT DETECTED Final   Candida auris NOT DETECTED NOT DETECTED Final   Candida glabrata NOT  DETECTED NOT DETECTED Final   Candida krusei NOT DETECTED NOT DETECTED Final   Candida parapsilosis NOT DETECTED NOT DETECTED Final   Candida tropicalis NOT DETECTED NOT DETECTED Final   Cryptococcus neoformans/gattii NOT DETECTED NOT DETECTED Final    Comment: Performed at [facility],  [address], [place], [place] [address]  Culture, blood (Routine X 2) w Reflex to ID Panel     Status: None (Preliminary result)   Collection Time: 10/27/23  2:17 PM   Specimen: BLOOD  Result Value Ref Range Status   Specimen Description BLOOD BLOOD RIGHT ARM  Final   Special Requests   Final    Blood Culture adequate volume BOTTLES DRAWN AEROBIC ONLY   Culture   Final    NO GROWTH 3 DAYS Performed at [facility], [address] [place], [place] [address]    Report Status PENDING  Incomplete  Culture, blood (Routine X 2) w Reflex to ID Panel     Status: None (Preliminary result)   Collection Time: 10/27/23  2:22 PM   Specimen: BLOOD  Result Value Ref Range Status   Specimen Description BLOOD BLOOD LEFT HAND  Final   Special Requests   Final    Blood Culture results may not be optimal due to an inadequate volume of blood received in culture bottles BOTTLES DRAWN AEROBIC ONLY   Culture   Final    NO GROWTH 3 DAYS Performed at [facility], [address] [place], [place] [address]    Report Status PENDING  Incomplete    Radiology Studies:  CARDIAC CATHETERIZATION Result Date: 10/28/2023 HEMODYNAMICS: RA:   20 mmHg with steep Y descents RV:   85/2-20 mmHg PA:   87/44 mmHg (58 mean) PCWP:  12 mmHg (mean)    Estimated Fick CO/CI   2.8 L/min, 1.6 L/min/m2 Thermodilution CO/CI   2.7 L/min, 1.6 L/min/m2    TPG    46  mmHg     PVR     16 Wood Units PAPi      2.2  IMPRESSION: Severely elevated pre capillary pressures Normal post capillary filling pressures Severely reduced cardiac output & index by Fick & TD Severely elevated PVR. Aditya Sabharwal 5:35 PM  US  EKG SITE RITE Result Date:  10/28/2023 If [facility] image not attached, placement could not be confirmed due to current cardiac rhythm.   Scheduled Meds:    atorvastatin   40 mg Oral QHS   budesonide  (PULMICORT ) nebulizer solution  0.5 mg Nebulization BID   Chlorhexidine  Gluconate Cloth  6 each Topical Daily   digoxin   0.125 mg Oral Daily   feeding supplement  237 mL Oral BID BM   furosemide   80 mg Intravenous BID   heparin   5,000 Units Subcutaneous Q8H   insulin  aspart  0-6 Units Subcutaneous TID WC   ipratropium-albuterol   3 mL Nebulization BID   levETIRAcetam   500 mg Oral BID   levothyroxine   100 mcg Oral Daily   midodrine   5 mg Oral TID WC   sildenafil   20 mg Oral TID   sodium chloride  flush  10-40 mL Intracatheter Q12H    Continuous Infusions:    milrinone  0.125 mcg/kg/min (10/29/23 0656)     LOS: 4 days     Trenda Mar, MD,  FACP, [facility], [facility], [facility]   [facility] Advisor [place]      To contact the attending provider between 7A-7P or the covering provider during after hours 7P-7A, please log into the web site [email or web address] and access using universal [place] password for that web site. If you do not have the password, please call the hospital operator.  10/30/2023, 11:08 AM

## 2023-10-31 ENCOUNTER — Telehealth (HOSPITAL_COMMUNITY): Payer: Self-pay

## 2023-10-31 ENCOUNTER — Other Ambulatory Visit (HOSPITAL_COMMUNITY): Payer: Self-pay

## 2023-10-31 ENCOUNTER — Encounter (HOSPITAL_COMMUNITY): Payer: Self-pay | Admitting: Cardiology

## 2023-10-31 DIAGNOSIS — I50813 Acute on chronic right heart failure: Secondary | ICD-10-CM | POA: Diagnosis not present

## 2023-10-31 DIAGNOSIS — I5031 Acute diastolic (congestive) heart failure: Secondary | ICD-10-CM | POA: Diagnosis not present

## 2023-10-31 DIAGNOSIS — E43 Unspecified severe protein-calorie malnutrition: Secondary | ICD-10-CM | POA: Diagnosis not present

## 2023-10-31 DIAGNOSIS — N179 Acute kidney failure, unspecified: Secondary | ICD-10-CM | POA: Diagnosis not present

## 2023-10-31 DIAGNOSIS — I272 Pulmonary hypertension, unspecified: Secondary | ICD-10-CM | POA: Diagnosis not present

## 2023-10-31 LAB — BASIC METABOLIC PANEL
Anion gap: 11 (ref 5–15)
BUN: 62 mg/dL — ABNORMAL HIGH (ref 6–20)
CO2: 26 mmol/L (ref 22–32)
Calcium: 9 mg/dL (ref 8.9–10.3)
Chloride: 96 mmol/L — ABNORMAL LOW (ref 98–111)
Creatinine, Ser: 1.85 mg/dL — ABNORMAL HIGH (ref 0.61–1.24)
GFR, Estimated: 42 mL/min — ABNORMAL LOW (ref >60)
Glucose, Bld: 121 mg/dL — ABNORMAL HIGH (ref 70–99)
Potassium: 4.2 mmol/L (ref 3.5–5.1)
Sodium: 133 mmol/L — ABNORMAL LOW (ref 135–145)

## 2023-10-31 LAB — CULTURE, BLOOD (ROUTINE X 2)
Culture: NO GROWTH
Special Requests: ADEQUATE

## 2023-10-31 LAB — COOXEMETRY PANEL
Carboxyhemoglobin: 2.2 % — ABNORMAL HIGH (ref 0.5–1.5)
Methemoglobin: 0.9 % (ref 0.0–1.5)
O2 Saturation: 61.3 %
Total hemoglobin: 14 g/dL (ref 12.0–16.0)

## 2023-10-31 LAB — CYCLIC CITRUL PEPTIDE ANTIBODY, IGG/IGA: CCP Antibodies IgG/IgA: 6 U (ref 0–19)

## 2023-10-31 LAB — GLUCOSE, CAPILLARY
Glucose-Capillary: 105 mg/dL — ABNORMAL HIGH (ref 70–99)
Glucose-Capillary: 112 mg/dL — ABNORMAL HIGH (ref 70–99)
Glucose-Capillary: 121 mg/dL — ABNORMAL HIGH (ref 70–99)
Glucose-Capillary: 135 mg/dL — ABNORMAL HIGH (ref 70–99)

## 2023-10-31 LAB — MAGNESIUM: Magnesium: 2.1 mg/dL (ref 1.7–2.4)

## 2023-10-31 MED ORDER — CLOPIDOGREL BISULFATE 75 MG PO TABS
75.0000 mg | ORAL_TABLET | Freq: Every day | ORAL | Status: DC
  Administered 2023-10-31 – 2023-11-02 (×3): 75 mg via ORAL
  Filled 2023-10-31 (×3): qty 1

## 2023-10-31 MED ORDER — ORAL CARE MOUTH RINSE: 15.0000 mL | OROMUCOSAL | Status: DC | PRN

## 2023-10-31 MED ORDER — RENA-VITE PO TABS
1.0000 | ORAL_TABLET | Freq: Every day | ORAL | Status: DC
  Administered 2023-10-31 – 2023-11-01 (×2): 1 via ORAL
  Filled 2023-10-31 (×2): qty 1

## 2023-10-31 NOTE — Progress Notes (Signed)
 Pt assessed but refused breathing treatments for this morning. BS clear/diminished, VS WNR.

## 2023-10-31 NOTE — TOC Initial Note (Addendum)
 Transition of Care [facility]) - Initial/Assessment Note    Patient Details  Name: Rick Williams MRN: [id number] Date of Birth: 1965-09-23  [facility] CM/SW Contact:    Benjiman Bras, RN Phone Number: [phone] 10/31/2023, 11:23 AM  Clinical Narrative:                 Patient is [facility] at [facility] and care is provided through the jail. TOC CM will arrange with [facility] follow up post dc. The facility will provide his medications. Will fax dc summary to facility at dc.   Contact info for [employer] medical: [facility] [phone]; fax [phone] send communication attention Medical   Expected Discharge Plan: [facility]rections Facility Barriers to Discharge: Continued Medical Work up   Patient Goals and CMS Choice            Expected Discharge Plan and Services   Discharge Planning Services: CM Consult   Living arrangements for the past 2 months: Correctional Facility                                      Prior Living Arrangements/Services Living arrangements for the past 2 months: Holiday representative Lives with:: Facility Resident          Need for Family Participation in Patient Care: Yes (Comment) Care giver support system in place?: Yes (comment)      Activities of Daily Living   ADL Screening (condition at time of admission) Independently performs ADLs?: Yes (appropriate for developmental age) Is the patient deaf or have difficulty hearing?: No Does the patient have difficulty seeing, even when wearing glasses/contacts?: No Does the patient have difficulty concentrating, remembering, or making decisions?: No  Permission Sought/Granted Permission sought to share information with : Case Manager                Emotional Assessment              Admission diagnosis:  Acute congestive heart failure (HCC) [I50.9] Pericardial effusion [I31.39] Hypoxia [R09.02] Congestive heart  failure, unspecified HF chronicity, unspecified heart failure type (HCC) [I50.9] Patient Active Problem List   Diagnosis Date Noted   Severe pulmonary hypertension (HCC) 10/28/2023   Acute diastolic CHF (congestive heart failure) (HCC) 10/26/2023   Acute metabolic encephalopathy 05/25/2023   AKI (acute kidney injury) (HCC) 05/25/2023   Hypoglycemia 05/25/2023   Congestive heart failure (CHF) (HCC) 05/25/2023   Hyponatremia 05/25/2023   Left arm weakness 01/20/2020   Wrist drop, left 01/20/2020   Left wrist drop 01/20/2020   Alcohol intoxication, uncomplicated (HCC)    Elevated AST (SGOT)    Macrocytosis without anemia    Uncontrolled hypertension    Alcohol abuse    Visual impairment    Left-sided weakness 02/26/2013   Leg weakness 02/26/2013   Pain in joint, lower leg 02/26/2013   Muscle weakness (generalized) 02/26/2013   Lack of coordination 02/26/2013   ICH (intracerebral hemorrhage) (HCC) 02/07/2013   Essential hypertension, malignant 02/06/2013   Encounter for long-term (current) use of high-risk medication 02/06/2013   History of Hemorrhagic stroke 01/30/2013   Acute respiratory failure due to AMS 01/30/2013   Hypertension 01/30/2013   IVH (intraventricular hemorrhage) (HCC) 01/30/2013   Intracranial hypertension 01/30/2013   PCP:  Patient, No Pcp Per Pharmacy:   [facility] [place] [place] - [address] [place]  [address] Phone: [phone] Fax: [phone]     Social Drivers of Health (SDOH) Social History: SDOH Screenings   Food Insecurity: Patient Declined (10/26/2023)  Housing: Unknown (10/26/2023)  Transportation Needs: No Transportation Needs (10/26/2023)  Utilities: Patient Declined (10/26/2023)  Tobacco Use: High Risk (10/26/2023)   SDOH Interventions:     Readmission Risk Interventions    10/27/2023    1:29 PM  Readmission Risk Prevention Plan  Transportation Screening Complete  Home Care Screening Complete  Medication Review  (RN CM) Complete

## 2023-10-31 NOTE — Progress Notes (Signed)
 SATURATION QUALIFICATIONS: (This note is used to comply with regulatory documentation for home oxygen)  Patient Saturations on Room Air at Rest = 86%  Patient Saturations on Room Air while Ambulating = N/A%  Patient Saturations on N/A Liters of oxygen while Ambulating = N/A%  Please briefly explain why patient needs home oxygen: Patient unable to maintain saturations at rest.

## 2023-10-31 NOTE — Progress Notes (Signed)
 Initial Nutrition Assessment  DOCUMENTATION CODES:   Underweight, Severe malnutrition in context of chronic illness  INTERVENTION:   - Continue Ensure Enlive po BID, each supplement provides 350 kcal and 20 grams of protein  - Add Magic Cup BID with lunch and dinner meals, each supplement provides 290 kcal and 9 grams of protein  - Renal MVI daily  - Encourage PO intake  NUTRITION DIAGNOSIS:   Severe Malnutrition related to chronic illness (COPD, CHF, CKD) as evidenced by severe fat depletion, severe muscle depletion.  GOAL:   Patient will meet greater than or equal to 90% of their needs  MONITOR:   PO intake, Supplement acceptance, Labs, Weight trends, I & O's, Skin  REASON FOR ASSESSMENT:   Consult Assessment of nutrition requirement/status  ASSESSMENT:   58 year old male who presented to the ED on 2/05 from jail with SOB. PMH of COPD, HTN, CVA, hypothyroidism, prior tobacco and EtOH abuse, DM, CHF. Pt admitted with acute on chronic diastolic CHF, hypothermia, AKI on CKD, bacteremia.  2/07 - transferred to [facility], s/p RHC 2/08 - PICC placement  Per H&P, pt has been at the [facility] jail since 05/22/23. Pt found to have severe pulmonary hypertension, moderate pericardial effusion, and moderate to severe tricuspid regurgitation.  Spoke with pt at bedside with Corporate treasurer in room. Pt reports that his appetite is okay. He states that he wasn't hungry before lunch arrived but is now a little hungry. Pt noted to have consumed 25% of meal tray at time of RD visit (meatloaf, mashed potatoes, green beans, fruit). Pt reports that he is not finished yet and plans to continue eating. Pt states that he likes the food well enough.  Pt shares that his appetite prior to admission was not very good. He thinks his appetite at present is a little better. Pt reports receiving 3 meals daily at jail. He reports eating oatmeal for breakfast but unable to recall what he may have  eaten for lunch and dinner meals.  Pt endorses weight loss. Pt reports a UBW of 138 lbs. Current weight is 116.8 lbs. Pt with a 17 kg weight loss this admission; pt is also net negative 7.7 L this admission. Question accuracy of admission weight and suspect majority of weight loss this admission can be attributed to diuresis. Weight history in chart is limited; weight on 06/16/23 documented as 70.3 kg. If accurate, this is a 17.3 kg (24.6%) weight loss in less than 5 months which is severe and significant for timeframe. However, difficult to determine how much of weight loss is related to fluid status. Pt does meet criteria for severe malnutrition based on NFPE alone.  Pt endorses receiving Ensure supplements this admission and states that he enjoys these. He would like to continue receiving two per day. Discussed other oral nutrition supplement options with pt who is willing to try Magic Cups with lunch and dinner meals. Will also order a renal MVI daily given history of CKD.  Pt denies any issues chewing or swallowing. He also denies any issues with nausea, vomiting, diarrhea, or constipation.  Admit weight: 70 kg Current weight: 53 kg  Meal Completion: 50-100% (Heart Healthy diet with 1500 mL fluid restriction)  Medications reviewed and include: digoxin , Ensure BID, SSI TID with meals  Labs reviewed: sodium 133, chloride 96, BUN 62, creatinine 1.85, hemoglobin A1C 6.9 CBG's: 88-118 x 24 hours  UOP: 2100 ml + 2 unmeasured occurrences x 24 hours I/O's: -7.7 L since admit  NUTRITION -  FOCUSED PHYSICAL EXAM:  Flowsheet Row Most Recent Value  Orbital Region Severe depletion  Upper Arm Region Severe depletion  Thoracic and Lumbar Region Severe depletion  Buccal Region Severe depletion  Temple Region Severe depletion  Clavicle Bone Region Severe depletion  Clavicle and Acromion Bone Region Severe depletion  Scapular Bone Region Severe depletion  Dorsal Hand Severe depletion  Patellar  Region Severe depletion  Anterior Thigh Region Severe depletion  Posterior Calf Region Severe depletion  Edema (RD Assessment) None  Hair Reviewed  Eyes Reviewed  Mouth Reviewed  Skin Reviewed  Nails Reviewed    Diet Order:   Diet Order             Diet Heart Fluid consistency: Thin; Fluid restriction: 1500 mL Fluid  Diet effective now                   EDUCATION NEEDS:   Education needs have been addressed  Skin:  Skin Assessment: Skin Integrity Issues: Stage II: coccyx Other: several scabbed areas to toes, R ankle  Last BM:  10/28/23  Height:   Ht Readings from Last 1 Encounters:  10/28/23 5\' 10"  (1.778 m)    Weight:   Wt Readings from Last 1 Encounters:  10/31/23 53 kg   BMI:  Body mass index is 16.77 kg/m.  Estimated Nutritional Needs:   Kcal:  1700-1900  Protein:  75-85 grams  Fluid:  1.7-1.9 L    Ernestina Headland, MS, RD, LDN Registered Dietitian II Please see AMiON for contact information.

## 2023-10-31 NOTE — Progress Notes (Signed)
 PROGRESS NOTE   Rick Williams  WUJ:[id number]    DOB: Feb 01, 1966    DOA: 10/26/2023  PCP: Patient, No Pcp Per   I have briefly reviewed patients previous medical records in [facility].  Chief Complaint  Patient presents with   Shortness of Breath    Brief Hospital Course:  58 year old currently incarcerated male, medical history significant for HTN, COPD, prior tobacco and alcohol use disorder, CVA 2007, cerebral hemorrhage 2014, chronic diastolic CHF, hypothyroidism and prediabetes, initially presented to the [facility] with complaints of difficulty breathing and cough over the past several days.  He reportedly has been at the [facility] jail since 05/22/2023.  On presentation, creatinine 1.7, troponin 25 and BNP 2518.  Admitted for pulmonary hypertension, acute right heart failure, pericardial effusion.  Transferred to [facility] 2/7, evaluated by Advanced Heart failure cardiology, underwent RHC, initiated on IV Lasix  and milrinone  drip.    Care primarily per Cardiology at this time.   Assessment & Plan:  Principal Problem:   Acute diastolic CHF (congestive heart failure) (HCC) Active Problems:   AKI (acute kidney injury) (HCC)   Essential hypertension, malignant   Alcohol abuse   Severe pulmonary hypertension (HCC)   Acute on chronic right heart failure/severe pulmonary hypertension Presented with worsening dyspnea and BNP of 2518 2D echo: LVEF 65 to 70%, severe asymmetric LVH, grade 1 diastolic dysfunction.  Interventricular septum is flattened in systole and diastole, consistent with right ventricular pressure and volume overload.  RV systolic function severely reduced.  Moderate pericardial effusion without cardiac tamponade.  Severe pulmonary hypertension CTA chest: No PE. RHC 2/7: Severely reduced cardiac output & index by Fick & TD. Severely elevated PVR.  Remains on IV Lasix  80 mg twice daily.  -7.7 L thus far.  Put out 2100 mL urine  yesterday.  Weight down from 154 on admission to 116 pounds. Also on digoxin  0.125 Mg daily, sildenafil  20 Mg 3 times daily for pulmonary hypertension and milrinone  drip.  Midodrine  added by cardiology. Discussed with AHF APP and requested that the take over attending role if patient continues on IV cardiac meds since there are no significant medical issues that are being actively managed by TRH.  Hypoxia without acute respiratory failure Does have hypoxia but acute respiratory failure ruled out, did not meet criteria No sustained tachypnea or respiratory distress Multifactorial due to acute right heart failure, pulmonary hypertension, COPD As per RN at bedside, overnight oxygen saturations dropped to the low to mid 80s and he was placed back on 2 L/min [place] oxygen. Need to check ambulatory O2 sats.  Pericardial effusion Seen on CTA chest and 2D echo, without clinical or imaging tamponade. Management per cardiology  Essential hypertension PTA antihypertensives (amlodipine , carvedilol  and clonidine ) held Controlled.  Hyperlipidemia Continue atorvastatin  40 Mg at bedtime  Hypothyroidism TSH 1.408 Continue levothyroxine  100 mcg daily.  Bacteremia ruled out There was a report called about gram-positive cocci on blood cultures which subsequently was redacted.  Briefly on vancomycin  which was stopped. Blood cultures negative to date.  Hypothermia Upon chart review, the temperature of 94.9 recorded on 2/5 at [facility] ED may have been inaccurate.   No infectious etiology.  TSH normal. Resolved.  COPD exacerbation Versus cardiac asthma Briefly on IV steroids. Resolved.  Acute kidney injury complicating stage II CKD Baseline creatinine in September of last year was in the 1.2-1.4 range. Since hospital admission, creatinine has mostly been in the 1.7 range.  This may be either  AKI or progressed CKD.  Creatinine up to 1.81 >1.85. Continue to trend daily BMP.  Avoid  nephrotoxins.  Lymphadenopathy CTA chest showed mild mediastinal, bilateral hilar, left axillary and gastrohepatic ligament lymphadenopathy which was reported as nonspecific Unclear etiology Recommend outpatient follow-up as deemed necessary.  History of alcohol and tobacco use disorder Patient denies recent use and has been incarcerated since 9/1.  Anemia of chronic disease Mild.  Stable in the 12 g range.  Prolonged QTc EKG 2/5: QTc 502 ms Maintain K >4 and magnesium  >2.  Appropriate today. Minimize QT prolonging medications.  Type 2 diabetes mellitus A1c 2/8: 6.9. Recheck A1c and initiate SSI.  Reasonable inpatient control.  Body mass index is 16.77 kg/m./Moderate to severe protein calorie malnutrition suspected. Consulted Museum/gallery exhibitions officer.   DVT prophylaxis: heparin  injection 5,000 Units Start: 10/26/23 2200     Code Status: Full Code:  Family Communication: No family at bedside.  Discussed with police officer at bedside. Disposition:  Status is: Inpatient Remains inpatient appropriate because: Ongoing IV Lasix  and IV milrinone  drip     Consultants:   Cardiology  Procedures:   RHC 2/7 RUE PICC line 2/8 per cardiology  Antimicrobials:      Subjective:  Patient evaluated along with his 2 RNs and guarding police officer at bedside.  Denies complaints.  Denies dyspnea.  States that he was out of bed to chair for about an hour without dyspnea on exertion.  Has not ambulated thus far.  Overall poor appetite and usually does not eat well at breakfast.  Encouraged p.o. intake.  Objective:   Vitals:   10/31/23 0422 10/31/23 0423 10/31/23 0424 10/31/23 0730  BP:  99/87 99/87 112/85  Pulse:   96 (!) 102  Resp: 15 14 19 18   Temp:   (!) 97.4 F (36.3 C) 97.6 F (36.4 C)  TempSrc:   Oral Oral  SpO2:   98% 94%  Weight:   53 kg   Height:        General exam: Young male, moderately built and frail, chronically ill looking lying comfortably propped up in bed  without distress.  Has bitemporal, bimaxillary muscle wasting.  Significant muscle loss overall. Respiratory system: Clear to auscultation.  No increased work of breathing. Cardiovascular system: S1 & S2 heard, RRR. No JVD, murmurs, rubs, gallops or clicks. No pedal edema.  Telemetry personally reviewed: Sinus rhythm with frequent bigeminal. Gastrointestinal system: Abdomen is nondistended, soft and nontender. No organomegaly or masses felt. Normal bowel sounds heard. Central nervous system: Mental status as noted above. No focal neurological deficits. Extremities: Symmetric 5 x 5 power.  Bilateral legs and cuffs. Skin: No rashes, lesions or ulcers Psychiatry: Judgement and insight appear normal. Mood & affect appropriate.     Data Reviewed:   I have personally reviewed following labs and imaging studies   CBC: Recent Labs  Lab 10/26/23 0936 10/27/23 0405 10/28/23 1700  WBC 9.8 8.3  --   NEUTROABS 7.4  --   --   HGB 11.8* 12.0* 12.2*  12.2*  HCT 37.1* 37.0* 36.0*  36.0*  MCV 85.3 84.3  --   PLT 281 302  --     Basic Metabolic Panel: Recent Labs  Lab 10/26/23 0935 10/26/23 0936 10/27/23 0405 10/28/23 0421 10/28/23 1700 10/29/23 0108 10/30/23 0418 10/31/23 0543  NA  --    < > 135 133* 141  141 137 136 133*  K  --    < > 4.0 3.9 4.2  4.2 4.4  3.5 4.2  CL  --    < > 105 105  --  103 97* 96*  CO2  --    < > 19* 18*  --  22 28 26   GLUCOSE  --    < > 130* 146*  --  125* 118* 121*  BUN  --    < > 51* 47*  --  50* 57* 62*  CREATININE  --    < > 1.73* 1.58*  --  1.75* 1.81* 1.85*  CALCIUM   --    < > 8.6* 8.2*  --  8.7* 8.9 9.0  MG 2.0  --   --  1.9  --  2.6* 2.1 2.1   < > = values in this interval not displayed.    Liver Function Tests: Recent Labs  Lab 10/27/23 0405  AST 23  ALT 15  ALKPHOS 80  BILITOT 0.6  PROT 8.5*  ALBUMIN 2.9*    CBG: Recent Labs  Lab 10/30/23 1544 10/30/23 2125 10/31/23 0645  GLUCAP 88 118* 105*    Microbiology Studies:    Recent Results (from the past 240 hours)  Resp panel by RT-PCR (RSV, Flu A&B, Covid) Anterior Nasal Swab     Status: None   Collection Time: 10/26/23  9:56 AM   Specimen: Anterior Nasal Swab  Result Value Ref Range Status   SARS Coronavirus 2 by RT PCR NEGATIVE NEGATIVE Final    Comment: (NOTE) SARS-CoV-2 target nucleic acids are NOT DETECTED.  The SARS-CoV-2 RNA is generally detectable in upper respiratory specimens during the acute phase of infection. The lowest concentration of SARS-CoV-2 viral copies this assay can detect is 138 copies/mL. A negative result does not preclude SARS-Cov-2 infection and should not be used as the sole basis for treatment or other patient management decisions. A negative result may occur with  improper specimen collection/handling, submission of specimen other than nasopharyngeal swab, presence of viral mutation(s) within the areas targeted by this assay, and inadequate number of viral copies(<138 copies/mL). A negative result must be combined with clinical observations, patient history, and epidemiological information. The expected result is Negative.  Fact Sheet for Patients:  BloggerCourse.com  Fact Sheet for Healthcare Providers:  SeriousBroker.it  This test is no t yet approved or cleared by the [place]  FDA and  has been authorized for detection and/or diagnosis of SARS-CoV-2 by FDA under an Emergency Use Authorization (EUA). This EUA will remain  in effect (meaning this test can be used) for the duration of the COVID-19 declaration under Section 564(b)(1) of the Act, 21 U.S.C.section 360bbb-3(b)(1), unless the authorization is terminated  or revoked sooner.       Influenza A by PCR NEGATIVE NEGATIVE Final   Influenza B by PCR NEGATIVE NEGATIVE Final    Comment: (NOTE) The Xpert Xpress SARS-CoV-2/FLU/RSV plus assay is intended as an aid in the diagnosis of influenza from  Nasopharyngeal swab specimens and should not be used as a sole basis for treatment. Nasal washings and aspirates are unacceptable for Xpert Xpress SARS-CoV-2/FLU/RSV testing.  Fact Sheet for Patients: BloggerCourse.com  Fact Sheet for Healthcare Providers: SeriousBroker.it  This test is not yet approved or cleared by the [place]  FDA and has been authorized for detection and/or diagnosis of SARS-CoV-2 by FDA under an Emergency Use Authorization (EUA). This EUA will remain in effect (meaning this test can be used) for the duration of the COVID-19 declaration under Section 564(b)(1) of the Act, 21 U.S.C. section 360bbb-3(b)(1), unless the authorization is  terminated or revoked.     Resp Syncytial Virus by PCR NEGATIVE NEGATIVE Final    Comment: (NOTE) Fact Sheet for Patients: BloggerCourse.com  Fact Sheet for Healthcare Providers: SeriousBroker.it  This test is not yet approved or cleared by the [place]  FDA and has been authorized for detection and/or diagnosis of SARS-CoV-2 by FDA under an Emergency Use Authorization (EUA). This EUA will remain in effect (meaning this test can be used) for the duration of the COVID-19 declaration under Section 564(b)(1) of the Act, 21 U.S.C. section 360bbb-3(b)(1), unless the authorization is terminated or revoked.  Performed at [facility], [address] [place], [place] [address]   Blood culture (routine x 2)     Status: None   Collection Time: 10/26/23 10:35 AM   Specimen: BLOOD  Result Value Ref Range Status   Specimen Description   Final    BLOOD BLOOD LEFT ARM Performed at [facility], [address] [place], [place] [address]    Special Requests   Final    BOTTLES DRAWN AEROBIC AND ANAEROBIC Blood Culture adequate volume Performed at [facility], [address] [place], [place] [address]    Culture  Setup Time    Final    NO ORGANISMS SEEN AEROBIC BOTTLE ONLY Gram Stain Report Called to,Read Back By and Verified With: Ann Keto RN 1914 [phone] K FORSYTH PREVIOUSLY REPORTED AS: GRAM POSITIVE COCCI CORRECTED RESULTS CALLED TO: PHARMD S HALL [id number] AT 1125 BY CM    Culture   Final    NO GROWTH Performed at [facility], [address], [place], [place] [address]    Report Status 10/28/2023 FINAL  Final  Blood culture (routine x 2)     Status: None   Collection Time: 10/26/23 10:35 AM   Specimen: BLOOD  Result Value Ref Range Status   Specimen Description BLOOD BLOOD RIGHT ARM AEROBIC BOTTLE ONLY  Final   Special Requests   Final    Blood Culture adequate volume BOTTLES DRAWN AEROBIC ONLY   Culture   Final    NO GROWTH 5 DAYS Performed at [facility], [address], [place], [place] [address]    Report Status 10/31/2023 FINAL  Final  Blood Culture ID Panel (Reflexed)     Status: None   Collection Time: 10/26/23 10:35 AM  Result Value Ref Range Status   Enterococcus faecalis NOT DETECTED NOT DETECTED Final   Enterococcus Faecium NOT DETECTED NOT DETECTED Final   Listeria monocytogenes NOT DETECTED NOT DETECTED Final   Staphylococcus species NOT DETECTED NOT DETECTED Final   Staphylococcus aureus (BCID) NOT DETECTED NOT DETECTED Final   Staphylococcus epidermidis NOT DETECTED NOT DETECTED Final   Staphylococcus lugdunensis NOT DETECTED NOT DETECTED Final   Streptococcus species NOT DETECTED NOT DETECTED Final   Streptococcus agalactiae NOT DETECTED NOT DETECTED Final   Streptococcus pneumoniae NOT DETECTED NOT DETECTED Final   Streptococcus pyogenes NOT DETECTED NOT DETECTED Final   A.calcoaceticus-baumannii NOT DETECTED NOT DETECTED Final   Bacteroides fragilis NOT DETECTED NOT DETECTED Final   Enterobacterales NOT DETECTED NOT DETECTED Final   Enterobacter cloacae complex NOT DETECTED NOT DETECTED Final   Escherichia coli NOT DETECTED NOT DETECTED Final   Klebsiella aerogenes NOT  DETECTED NOT DETECTED Final   Klebsiella oxytoca NOT DETECTED NOT DETECTED Final   Klebsiella pneumoniae NOT DETECTED NOT DETECTED Final   Proteus species NOT DETECTED NOT DETECTED Final   Salmonella species NOT DETECTED NOT DETECTED Final   Serratia marcescens NOT  DETECTED NOT DETECTED Final   Haemophilus influenzae NOT DETECTED NOT DETECTED Final   Neisseria meningitidis NOT DETECTED NOT DETECTED Final   Pseudomonas aeruginosa NOT DETECTED NOT DETECTED Final   Stenotrophomonas maltophilia NOT DETECTED NOT DETECTED Final   Candida albicans NOT DETECTED NOT DETECTED Final   Candida auris NOT DETECTED NOT DETECTED Final   Candida glabrata NOT DETECTED NOT DETECTED Final   Candida krusei NOT DETECTED NOT DETECTED Final   Candida parapsilosis NOT DETECTED NOT DETECTED Final   Candida tropicalis NOT DETECTED NOT DETECTED Final   Cryptococcus neoformans/gattii NOT DETECTED NOT DETECTED Final    Comment: Performed at [facility], [address], [place], [place] [address]  Culture, blood (Routine X 2) w Reflex to ID Panel     Status: None (Preliminary result)   Collection Time: 10/27/23  2:17 PM   Specimen: BLOOD  Result Value Ref Range Status   Specimen Description BLOOD BLOOD RIGHT ARM  Final   Special Requests   Final    Blood Culture adequate volume BOTTLES DRAWN AEROBIC ONLY   Culture   Final    NO GROWTH 4 DAYS Performed at [facility], [address] [place], [place] [address]    Report Status PENDING  Incomplete  Culture, blood (Routine X 2) w Reflex to ID Panel     Status: None (Preliminary result)   Collection Time: 10/27/23  2:22 PM   Specimen: BLOOD  Result Value Ref Range Status   Specimen Description BLOOD BLOOD LEFT HAND  Final   Special Requests   Final    Blood Culture results may not be optimal due to an inadequate volume of blood received in culture bottles BOTTLES DRAWN AEROBIC ONLY   Culture   Final    NO GROWTH 4 DAYS Performed at [facility],  [address] [place], [place] [address]    Report Status PENDING  Incomplete    Radiology Studies:  No results found.   Scheduled Meds:    atorvastatin   40 mg Oral QHS   budesonide  (PULMICORT ) nebulizer solution  0.5 mg Nebulization BID   Chlorhexidine  Gluconate Cloth  6 each Topical Daily   digoxin   0.125 mg Oral Daily   feeding supplement  237 mL Oral BID BM   heparin   5,000 Units Subcutaneous Q8H   insulin  aspart  0-6 Units Subcutaneous TID WC   ipratropium-albuterol   3 mL Nebulization BID   levETIRAcetam   500 mg Oral BID   levothyroxine   100 mcg Oral Daily   midodrine   2.5 mg Oral TID WC   sildenafil   20 mg Oral TID   sodium chloride  flush  10-40 mL Intracatheter Q12H    Continuous Infusions:    milrinone  0.125 mcg/kg/min (10/30/23 1350)     LOS: 5 days     Aubrey Blas, MD,  FACP, [facility], [facility], [facility]   [facility] Advisor [place]      To contact the attending provider between 7A-7P or the covering provider during after hours 7P-7A, please log into the web site [email or web address] and access using universal [place] password for that web site. If you do not have the password, please call the hospital operator.  10/31/2023, 8:37 AM

## 2023-10-31 NOTE — Plan of Care (Signed)

## 2023-10-31 NOTE — Telephone Encounter (Signed)
 Pharmacy Patient Advocate Encounter  Insurance verification completed.    The patient is insured through [facility] MEDICAID.     Ran test claim for Sildenafil  and the current 30 day co-pay is $4.00.   This test claim was processed through [facility]- copay amounts may vary at other pharmacies due to pharmacy/plan contracts, or as the patient moves through the different stages of their insurance plan.

## 2023-10-31 NOTE — Plan of Care (Signed)
 Patient has poor appetite, Ensure given

## 2023-10-31 NOTE — TOC Progression Note (Signed)
 Transition of Care [facility]) - Progression Note    Patient Details  Name: Rick Williams MRN: [id number] Date of Birth: [birth year]  [facility] CM/SW Contact  Ernst Heap Phone Number: [phone] 10/31/2023, 11:49 AM  Clinical Narrative:  11:04 AM- HF CSW attempted to meet with pt at bedside. Pt was being seen by nursing staff. CSW will make attempts to meet with pt at a more appropriate time.   TOC will continue following.     Expected Discharge Plan: [facility] Barriers to Discharge: Continued Medical Work up  Expected Discharge Plan and Services   Discharge Planning Services: CM Consult   Living arrangements for the past 2 months: [facility]                                       Social Determinants of Health (SDOH) Interventions SDOH Screenings   Food Insecurity: Patient Declined (10/26/2023)  Housing: Unknown (10/26/2023)  Transportation Needs: No Transportation Needs (10/26/2023)  Utilities: Patient Declined (10/26/2023)  Tobacco Use: High Risk (10/26/2023)    Readmission Risk Interventions    10/27/2023    1:29 PM  Readmission Risk Prevention Plan  Transportation Screening Complete  Home Care Screening Complete  Medication Review (RN CM) Complete

## 2023-10-31 NOTE — Progress Notes (Signed)
 Advanced Heart Failure Rounding Note  Cardiologist: Teddie Favre, MD  Chief Complaint:  Subjective:    Diuresed well w/ IV Lasix . Net negative 7L. CVPs low ~2.   SCr 1.9 (b/l ~1.4). BUN also trending up, 50>>62.   On milrinone  0.125 w/ stable Co-ox 61%.  Feels tired/weak but breathing much improved. Sitting up in chair. No other complaints. Prison guard at bedside.    Objective:   Weight Range: 53 kg Body mass index is 16.77 kg/m.   Vital Signs:   Temp:  [97.4 F (36.3 C)-98.3 F (36.8 C)] 97.6 F (36.4 C) (02/10 0730) Pulse Rate:  [94-102] 102 (02/10 0730) Resp:  [11-19] 18 (02/10 0730) BP: (99-133)/(85-97) 112/85 (02/10 0730) SpO2:  [87 %-98 %] 87 % (02/10 0952) Weight:  [53 kg] 53 kg (02/10 0424) Last BM Date : 10/28/23  Weight change: Filed Weights   10/29/23 0400 10/30/23 0408 10/31/23 0424  Weight: 59.4 kg 51.2 kg 53 kg    Intake/Output:   Intake/Output Summary (Last 24 hours) at 10/31/2023 1102 Last data filed at 10/31/2023 4098 Gross per 24 hour  Intake 700 ml  Output 2350 ml  Net -1650 ml      Physical Exam   CVP 2 General:  thin/ disheveled appearing, looks much older than actual age. No respiratory difficulty HEENT: normal Neck: supple. JVD not elevated. Carotids 2+ bilat; no bruits. No lymphadenopathy or thyromegaly appreciated. Cor: PMI nondisplaced. Regular rate & rhythm. No rubs, gallops or murmurs. Lungs: decreased BS at the bases bilaterally  Abdomen: soft, nontender, nondistended. No hepatosplenomegaly. No bruits or masses. Good bowel sounds. Extremities: no cyanosis, clubbing, rash, edema + LUE PICC  Neuro: alert & oriented x 3, cranial nerves grossly intact. moves all 4 extremities w/o difficulty. Affect pleasant.   Telemetry   NSR 90s, personally reviewed   EKG    N/A   Labs    CBC Recent Labs    10/28/23 1700  HGB 12.2*  12.2*  HCT 36.0*  36.0*   Basic Metabolic Panel Recent Labs    11/91/47 0418  10/31/23 0543  NA 136 133*  K 3.5 4.2  CL 97* 96*  CO2 28 26  GLUCOSE 118* 121*  BUN 57* 62*  CREATININE 1.81* 1.85*  CALCIUM  8.9 9.0  MG 2.1 2.1   Liver Function Tests No results for input(s): "AST", "ALT", "ALKPHOS", "BILITOT", "PROT", "ALBUMIN" in the last 72 hours. No results for input(s): "LIPASE", "AMYLASE" in the last 72 hours. Cardiac Enzymes No results for input(s): "CKTOTAL", "CKMB", "CKMBINDEX", "TROPONINI" in the last 72 hours.  BNP: BNP (last 3 results) Recent Labs    10/26/23 1100  BNP 2,518.0*    ProBNP (last 3 results) No results for input(s): "PROBNP" in the last 8760 hours.   D-Dimer No results for input(s): "DDIMER" in the last 72 hours. Hemoglobin A1C Recent Labs    10/29/23 0108  HGBA1C 6.9*   Fasting Lipid Panel No results for input(s): "CHOL", "HDL", "LDLCALC", "TRIG", "CHOLHDL", "LDLDIRECT" in the last 72 hours. Thyroid Function Tests No results for input(s): "TSH", "T4TOTAL", "T3FREE", "THYROIDAB" in the last 72 hours.  Invalid input(s): "FREET3"  Other results:   Imaging    No results found.   Medications:     Scheduled Medications:  atorvastatin   40 mg Oral QHS   budesonide  (PULMICORT ) nebulizer solution  0.5 mg Nebulization BID   Chlorhexidine  Gluconate Cloth  6 each Topical Daily   digoxin   0.125 mg Oral Daily  feeding supplement  237 mL Oral BID BM   heparin   5,000 Units Subcutaneous Q8H   insulin  aspart  0-6 Units Subcutaneous TID WC   ipratropium-albuterol   3 mL Nebulization BID   levETIRAcetam   500 mg Oral BID   levothyroxine   100 mcg Oral Daily   midodrine   2.5 mg Oral TID WC   sildenafil   20 mg Oral TID   sodium chloride  flush  10-40 mL Intracatheter Q12H    Infusions:   PRN Medications: acetaminophen  **OR** acetaminophen , albuterol , ondansetron  **OR** ondansetron  (ZOFRAN ) IV, polyethylene glycol, sodium chloride  flush    Patient Profile   Rick Williams is a 58 yo male with PMH of systolic heart  failure, hemorraghic stroke 2007, COPD, and hypertension. Admitted with acute hypoxic respiratory failure 2/2 pulmonary hypertension and RV failure.   Assessment/Plan    RV failure Pulmonary Hypertension -New diagnosis; likely Group 1 + 3 -Hypoxic with sats in 70s on presentation -Echo EF 65-70%, severe asymmetric LVH inferior and lateral segments, RV severely enlarged and severely reduced, RVSP 108 mmHg, severe RAE, moderate pericardial effusion, moderate to severe TR -Chest CT negative for PE  -Diuresed w/ IV Lasix . -RHC on 2/7 with severely pre-capillary filling pressures, reduced CI and PVR of 16 Wood units. Started on Sildenafil   -Breathing much improved. CVP low. Co-ox 61% on 0.125 mg milrinone  -Stop milrinone  today. He is not candidate for outpatient milrinone /advanced therapies given incarceration  -CVPs low w/ slight bump in SCr. Hold diuretics today.  -Tolerating sildenafil  very well; continue 20mg  TID -Continue digoxin  0.125. monitor SCr closely  -Eventual V/Q scan, PFTs, possible HiRes CT as outpatient  -Sleep study as outpatient but will depend on timing of release from jail (has upcoming court date) -CTD serologies negative thus far -Stop midodrine , follow BP     Pericardial Effusion - Moderate in severity on echo 2/6, no evidence of tamponade - marker of poor prognosis.    AKI on CKD Stage 2 - baseline Cr ~1.3 - sCr 1.97 today; suspect over diuresis - hold diuretics - follow BMP    Tricuspid Regurgitation - in the setting of RV Failure. RV and RA severely dilated - moderate to severe on echo   COPD - consider stopping Solu-Medrol , presentation more likely d/t pulmonary hypertension/RV failure rather than COPDE - continue nebs per primary  Stop milrinone  today. He is not candidate for outpatient milrinone /advanced therapies given incarceration. Would watch 1 more day and if SCr remains stable, ok from HF standpoint to d/c tomorrow. HF MD to follow w/ further  recs.    Length of Stay: [address] PA-C  10/31/2023, 11:02 AM  Advanced Heart Failure Team Pager [phone] (M-F; 7a - 5p)  Please contact [facility] for night-coverage after hours (5p -7a ) and weekends on [email or web address]

## 2023-11-01 DIAGNOSIS — R0902 Hypoxemia: Secondary | ICD-10-CM | POA: Diagnosis not present

## 2023-11-01 DIAGNOSIS — R040 Epistaxis: Secondary | ICD-10-CM | POA: Diagnosis not present

## 2023-11-01 DIAGNOSIS — I5031 Acute diastolic (congestive) heart failure: Secondary | ICD-10-CM | POA: Diagnosis not present

## 2023-11-01 LAB — BASIC METABOLIC PANEL
Anion gap: 12 (ref 5–15)
BUN: 54 mg/dL — ABNORMAL HIGH (ref 6–20)
CO2: 24 mmol/L (ref 22–32)
Calcium: 8.9 mg/dL (ref 8.9–10.3)
Chloride: 96 mmol/L — ABNORMAL LOW (ref 98–111)
Creatinine, Ser: 1.64 mg/dL — ABNORMAL HIGH (ref 0.61–1.24)
GFR, Estimated: 48 mL/min — ABNORMAL LOW (ref >60)
Glucose, Bld: 107 mg/dL — ABNORMAL HIGH (ref 70–99)
Potassium: 4.3 mmol/L (ref 3.5–5.1)
Sodium: 132 mmol/L — ABNORMAL LOW (ref 135–145)

## 2023-11-01 LAB — ANCA TITERS
Atypical P-ANCA titer: <1:20 {titer}
C-ANCA: <1:20 {titer}
P-ANCA: <1:20 {titer}

## 2023-11-01 LAB — CULTURE, BLOOD (ROUTINE X 2)
Culture: NO GROWTH
Culture: NO GROWTH
Special Requests: ADEQUATE

## 2023-11-01 LAB — GLUCOSE, CAPILLARY
Glucose-Capillary: 97 mg/dL (ref 70–99)
Glucose-Capillary: 148 mg/dL — ABNORMAL HIGH (ref 70–99)
Glucose-Capillary: 118 mg/dL — ABNORMAL HIGH (ref 70–99)
Glucose-Capillary: 109 mg/dL — ABNORMAL HIGH (ref 70–99)

## 2023-11-01 LAB — COOXEMETRY PANEL
Carboxyhemoglobin: 1.7 % — ABNORMAL HIGH (ref 0.5–1.5)
Methemoglobin: 0.7 % (ref 0.0–1.5)
O2 Saturation: 53.6 %
Total hemoglobin: 13.4 g/dL (ref 12.0–16.0)

## 2023-11-01 LAB — MAGNESIUM: Magnesium: 2.1 mg/dL (ref 1.7–2.4)

## 2023-11-01 LAB — DIGOXIN LEVEL: Digoxin Level: 0.8 ng/mL (ref 0.8–2.0)

## 2023-11-01 MED ORDER — HYDROXYZINE HCL 25 MG PO TABS
50.0000 mg | ORAL_TABLET | Freq: Every day | ORAL | Status: DC
  Administered 2023-11-01: 50 mg via ORAL
  Filled 2023-11-01: qty 2

## 2023-11-01 MED ORDER — DIGOXIN 125 MCG PO TABS: 0.1250 mg | ORAL_TABLET | Freq: Every day | ORAL | 2 refills | Status: DC

## 2023-11-01 MED ORDER — FOLIC ACID 1 MG PO TABS
1.0000 mg | ORAL_TABLET | Freq: Every day | ORAL | Status: DC
  Administered 2023-11-01 – 2023-11-02 (×2): 1 mg via ORAL
  Filled 2023-11-01 (×2): qty 1

## 2023-11-01 MED ORDER — HYDROXYZINE PAMOATE 25 MG PO CAPS
25.0000 mg | ORAL_CAPSULE | Freq: Two times a day (BID) | ORAL | Status: DC
Start: 2023-11-01 — End: 2023-11-01

## 2023-11-01 MED ORDER — SILDENAFIL CITRATE 20 MG PO TABS: 20.0000 mg | ORAL_TABLET | Freq: Three times a day (TID) | ORAL | 2 refills | Status: DC

## 2023-11-01 MED ORDER — MIRTAZAPINE 15 MG PO TABS
15.0000 mg | ORAL_TABLET | Freq: Every day | ORAL | Status: DC
  Administered 2023-11-01: 15 mg via ORAL
  Filled 2023-11-01: qty 1

## 2023-11-01 MED ORDER — ATORVASTATIN CALCIUM 40 MG PO TABS
40.0000 mg | ORAL_TABLET | Freq: Every day | ORAL | 3 refills | Status: DC
Start: 2023-11-01 — End: 2023-11-02

## 2023-11-01 MED ORDER — HYDROXYZINE HCL 25 MG PO TABS
25.0000 mg | ORAL_TABLET | Freq: Every morning | ORAL | Status: DC
  Administered 2023-11-01 – 2023-11-02 (×2): 25 mg via ORAL
  Filled 2023-11-01 (×2): qty 1

## 2023-11-01 MED ORDER — OXYMETAZOLINE HCL 0.05 % NA SOLN
1.0000 | Freq: Two times a day (BID) | NASAL | Status: DC
  Administered 2023-11-01 (×2): 1 via NASAL
  Filled 2023-11-01: qty 30

## 2023-11-01 NOTE — Progress Notes (Addendum)
 PROGRESS NOTE   Rick Williams  RUE:[id number]    DOB: 04/09/1966    DOA: 10/26/2023  PCP: Patient, No Pcp Per   I have briefly reviewed patients previous medical records in [facility].  Chief Complaint  Patient presents with   Shortness of Breath    Brief Hospital Course:  58 year old currently incarcerated male, medical history significant for HTN, COPD, prior tobacco and alcohol use disorder, CVA 2007, cerebral hemorrhage 2014, chronic diastolic CHF, hypothyroidism and prediabetes, initially presented to the [facility] with complaints of difficulty breathing and cough over the past several days.  He reportedly has been at the [facility] jail since 05/22/2023.  On presentation, creatinine 1.7, troponin 25 and BNP 2518.  Admitted for pulmonary hypertension, acute right heart failure, pericardial effusion.  Transferred to [facility] 2/7, evaluated by Advanced Heart failure cardiology, underwent RHC, initiated on IV Lasix and milrinone drip for a couple of days, discontinued 2/9, cleared for DC by cardiology.  However patient had an episode of bilateral epistaxis on 2/11, monitor closely overnight and if no further issues then hopeful DC back to jail 2/12.   Assessment & Plan:  Principal Problem:   Acute diastolic CHF (congestive heart failure) (HCC) Active Problems:   AKI (acute kidney injury) (HCC)   Essential hypertension, malignant   Alcohol abuse   Severe pulmonary hypertension (HCC)   Protein-calorie malnutrition, severe   Acute on chronic right heart failure/severe pulmonary hypertension Presented with worsening dyspnea and BNP of 2518 2D echo: LVEF 65 to 70%, severe asymmetric LVH, grade 1 diastolic dysfunction.  Interventricular septum is flattened in systole and diastole, consistent with right ventricular pressure and volume overload.  RV systolic function severely reduced.  Moderate pericardial effusion without cardiac tamponade.  Severe  pulmonary hypertension CTA chest: No PE. RHC 2/7: Severely reduced cardiac output & index by Fick & TD. Severely elevated PVR.  Treated with several days of IV Lasix 80 mg twice daily.  -7.3 L thus far.  Weight down from 154 on admission to 115 pounds. Also on digoxin 0.125 Mg daily, sildenafil 20 Mg 3 times daily for pulmonary hypertension and milrinone drip.  Midodrine added and discontinued. Cardiology follow-up appreciated.  IV Lasix and milrinone drip were discontinued yesterday.  They have seen him today, signed off and cleared for discharge home with following DC med recommendations and have arranged office visit on 11/11/2023.  Epistaxis, B/L, right > left. Unclear etiology.  Could be related to [place] oxygen Added Afrin nasal spray. Right nose packed with cotton by RN. Appears to have resolved. Monitor closely.  Did not appear to be large volume.  HF Team Medication Recommendations for Home: Lasix 20 Mg daily Atorvastatin 40 mg daily Plavix 75 mg daily Digoxin 0.125 mg daily Sildenafil 20 mg tid  Hypoxia without acute respiratory failure Does have hypoxia but acute respiratory failure ruled out, did not meet criteria No sustained tachypnea or respiratory distress Multifactorial due to acute right heart failure, pulmonary hypertension, COPD Home O2 evaluation on 2/11 shows that he would require 3 L/min oxygen at discharge.  However this evaluation has to be completed again tomorrow prior to discharge.  Pericardial effusion Seen on CTA chest and 2D echo, without clinical or imaging tamponade. Management per cardiology  Essential hypertension PTA antihypertensives (amlodipine, carvedilol and clonidine) discontinued by cardiology.  Blood pressures currently controlled on above heart failure meds alone.  Hyperlipidemia Continue atorvastatin 40 Mg at bedtime  Hypothyroidism TSH 1.408 Continue levothyroxine  100 mcg daily.  Bacteremia ruled out There was a report called about  gram-positive cocci on blood cultures which subsequently was redacted.  Briefly on vancomycin which was stopped. Blood cultures negative to date.  Hypothermia Upon chart review, the temperature of 94.9 recorded on 2/5 at [facility] ED may have been inaccurate.   No infectious etiology.  TSH normal. Resolved.  COPD exacerbation Versus cardiac asthma Briefly on IV steroids. Resolved.  Acute kidney injury complicating stage II CKD Baseline creatinine in September of last year was in the 1.2-1.4 range. Since hospital admission, creatinine has mostly been in the 1.7 range.  This may be either AKI or progressed CKD.  Creatinine has improved from 1.85-1.64 likely due to discontinuing the high-dose IV Lasix.  Follow BMP in AM. Avoid nephrotoxins. Recommend follow-up of BMP when discharged to present as well.  Lymphadenopathy CTA chest showed mild mediastinal, bilateral hilar, left axillary and gastrohepatic ligament lymphadenopathy which was reported as nonspecific Unclear etiology Recommend outpatient follow-up as deemed necessary.  History of alcohol and tobacco use disorder Patient denies recent use and has been incarcerated since 9/1.  Anemia of chronic disease Mild.  Stable in the 12 g range.  Prolonged QTc EKG 2/5: QTc 502 ms Maintain K >4 and magnesium >2.  Appropriate today. Minimize QT prolonging medications.  Type 2 diabetes mellitus A1c 2/8: 6.9. SSI.  Reasonable inpatient control. Consider metoprolol at discharge.  History of prior stroke, ICH,?  Seizure Was on Plavix 75 Mg daily and Keppra PTA, continue.  Body mass index is 16.54 kg/m.  Nutrition Status: Nutrition Problem: Severe Malnutrition Etiology: chronic illness (COPD, CHF, CKD) Signs/Symptoms: severe fat depletion, severe muscle depletion Interventions: Ensure Enlive (each supplement provides 350kcal and 20 grams of protein), Magic cup, MVI Appreciate and agree with registered dietitian input as above.  Follow  recommendations. Resumed prior home dose of folate and Remeron to stimulate appetite.   DVT prophylaxis: heparin injection 5,000 Units Start: 10/26/23 2200     Code Status: Full Code:  Family Communication: No family at bedside.  Discussed with prison guard at bedside. Disposition:  Due to epistaxis this morning, which has since stopped, monitor closely overnight and if no further episodes, DC to jail 2/12.     Consultants:   Cardiology  Procedures:   RHC 2/7 RUE PICC line 2/8 per cardiology.  PICC line to be removed prior to discharge.  Antimicrobials:      Subjective:  When I first saw the patient this morning, patient denied complaints including dyspnea.  He stated that he was up in chair yesterday for about an hour.  He had not ambulated.  He had completed eating almost all of his breakfast because that was his favorite, sausage. An hour or 2 later, RN messaged attending indicating that patient had epistaxis, went back and evaluated patient again.  Objective:   Vitals:   10/31/23 2022 11/01/23 0002 11/01/23 0419 11/01/23 0716  BP: 119/86 110/87 121/86 118/89  Pulse: (!) 110 (!) 110 (!) 109 (!) 103  Resp: 15 16 14 17   Temp: 98.3 F (36.8 C) 98.7 F (37.1 C) 97.8 F (36.6 C) (!) 97.1 F (36.2 C)  TempSrc: Oral Oral Oral Oral  SpO2: 95% 93% 93% 100%  Weight:   52.3 kg   Height:        General exam: Young male, moderately built and frail, chronically ill looking sitting up comfortably in bed without distress.  Has bitemporal, bimaxillary muscle wasting.  Significant muscle loss overall.  ENT: Left anterior nare, only a speck of blood noted deep inside.  No active bleeding.  Right anterior nare, packed with gauze with no active bleeding.  Posterior pharyngeal wall without blood. Respiratory system: Clear to auscultation.  No increased work of breathing. Cardiovascular system: S1 & S2 heard, RRR. No JVD, murmurs, rubs, gallops or clicks. No pedal edema.  Telemetry  personally reviewed: Sinus tachycardia in the low 100s. Gastrointestinal system: Abdomen is nondistended, soft and nontender. No organomegaly or masses felt. Normal bowel sounds heard. Central nervous system: Mental status as noted above. No focal neurological deficits. Extremities: Symmetric 5 x 5 power.  Bilateral legs and cuffs. Skin: No rashes, lesions or ulcers Psychiatry: Judgement and insight appear normal. Mood & affect appropriate.     Data Reviewed:   I have personally reviewed following labs and imaging studies   CBC: Recent Labs  Lab 10/26/23 0936 10/27/23 0405 10/28/23 1700  WBC 9.8 8.3  --   NEUTROABS 7.4  --   --   HGB 11.8* 12.0* 12.2*  12.2*  HCT 37.1* 37.0* 36.0*  36.0*  MCV 85.3 84.3  --   PLT 281 302  --     Basic Metabolic Panel: Recent Labs  Lab 10/28/23 0421 10/28/23 1700 10/29/23 0108 10/30/23 0418 10/31/23 0543 11/01/23 0515  NA 133* 141  141 137 136 133* 132*  K 3.9 4.2  4.2 4.4 3.5 4.2 4.3  CL 105  --  103 97* 96* 96*  CO2 18*  --  22 28 26 24   GLUCOSE 146*  --  125* 118* 121* 107*  BUN 47*  --  50* 57* 62* 54*  CREATININE 1.58*  --  1.75* 1.81* 1.85* 1.64*  CALCIUM 8.2*  --  8.7* 8.9 9.0 8.9  MG 1.9  --  2.6* 2.1 2.1 2.1    Liver Function Tests: Recent Labs  Lab 10/27/23 0405  AST 23  ALT 15  ALKPHOS 80  BILITOT 0.6  PROT 8.5*  ALBUMIN 2.9*    CBG: Recent Labs  Lab 10/31/23 1522 10/31/23 2126 11/01/23 0626  GLUCAP 121* 135* 97    Microbiology Studies:   Recent Results (from the past 240 hours)  Resp panel by RT-PCR (RSV, Flu A&B, Covid) Anterior Nasal Swab     Status: None   Collection Time: 10/26/23  9:56 AM   Specimen: Anterior Nasal Swab  Result Value Ref Range Status   SARS Coronavirus 2 by RT PCR NEGATIVE NEGATIVE Final    Comment: (NOTE) SARS-CoV-2 target nucleic acids are NOT DETECTED.  The SARS-CoV-2 RNA is generally detectable in upper respiratory specimens during the acute phase of infection. The  lowest concentration of SARS-CoV-2 viral copies this assay can detect is 138 copies/mL. A negative result does not preclude SARS-Cov-2 infection and should not be used as the sole basis for treatment or other patient management decisions. A negative result may occur with  improper specimen collection/handling, submission of specimen other than nasopharyngeal swab, presence of viral mutation(s) within the areas targeted by this assay, and inadequate number of viral copies(<138 copies/mL). A negative result must be combined with clinical observations, patient history, and epidemiological information. The expected result is Negative.  Fact Sheet for Patients:  BloggerCourse.com  Fact Sheet for Healthcare Providers:  SeriousBroker.it  This test is no t yet approved or cleared by the [place] FDA and  has been authorized for detection and/or diagnosis of SARS-CoV-2 by FDA under an Emergency Use Authorization (EUA). This EUA will  remain  in effect (meaning this test can be used) for the duration of the COVID-19 declaration under Section 564(b)(1) of the Act, 21 U.S.C.section 360bbb-3(b)(1), unless the authorization is terminated  or revoked sooner.       Influenza A by PCR NEGATIVE NEGATIVE Final   Influenza B by PCR NEGATIVE NEGATIVE Final    Comment: (NOTE) The Xpert Xpress SARS-CoV-2/FLU/RSV plus assay is intended as an aid in the diagnosis of influenza from Nasopharyngeal swab specimens and should not be used as a sole basis for treatment. Nasal washings and aspirates are unacceptable for Xpert Xpress SARS-CoV-2/FLU/RSV testing.  Fact Sheet for Patients: BloggerCourse.com  Fact Sheet for Healthcare Providers: SeriousBroker.it  This test is not yet approved or cleared by the [place] FDA and has been authorized for detection and/or diagnosis of SARS-CoV-2 by FDA under  an Emergency Use Authorization (EUA). This EUA will remain in effect (meaning this test can be used) for the duration of the COVID-19 declaration under Section 564(b)(1) of the Act, 21 U.S.C. section 360bbb-3(b)(1), unless the authorization is terminated or revoked.     Resp Syncytial Virus by PCR NEGATIVE NEGATIVE Final    Comment: (NOTE) Fact Sheet for Patients: BloggerCourse.com  Fact Sheet for Healthcare Providers: SeriousBroker.it  This test is not yet approved or cleared by the [place] FDA and has been authorized for detection and/or diagnosis of SARS-CoV-2 by FDA under an Emergency Use Authorization (EUA). This EUA will remain in effect (meaning this test can be used) for the duration of the COVID-19 declaration under Section 564(b)(1) of the Act, 21 U.S.C. section 360bbb-3(b)(1), unless the authorization is terminated or revoked.  Performed at [facility], [address] [place], [place] [address]   Blood culture (routine x 2)     Status: None   Collection Time: 10/26/23 10:35 AM   Specimen: BLOOD  Result Value Ref Range Status   Specimen Description   Final    BLOOD BLOOD LEFT ARM Performed at [facility], [address], [place], [place] [address]    Special Requests   Final    BOTTLES DRAWN AEROBIC AND ANAEROBIC Blood Culture adequate volume Performed at [facility], [address], [place], [place] [address]    Culture  Setup Time   Final    NO ORGANISMS SEEN AEROBIC BOTTLE ONLY Gram Stain Report Called to,Read Back By and Verified With: Halford Decamp RN 1914 [phone] K FORSYTH PREVIOUSLY REPORTED AS: GRAM POSITIVE COCCI CORRECTED RESULTS CALLED TO: PHARMD S HALL [id number] AT 1125 BY CM    Culture   Final    NO GROWTH Performed at [facility], [address] [place], [place] [address]    Report Status 10/28/2023 FINAL  Final  Blood culture (routine x 2)     Status: None   Collection Time: 10/26/23  10:35 AM   Specimen: BLOOD  Result Value Ref Range Status   Specimen Description BLOOD BLOOD RIGHT ARM AEROBIC BOTTLE ONLY  Final   Special Requests   Final    Blood Culture adequate volume BOTTLES DRAWN AEROBIC ONLY   Culture   Final    NO GROWTH 5 DAYS Performed at [facility], [address], [place], [place] [address]    Report Status 10/31/2023 FINAL  Final  Blood Culture ID Panel (Reflexed)     Status: None   Collection Time: 10/26/23 10:35 AM  Result Value Ref Range Status   Enterococcus faecalis NOT DETECTED NOT DETECTED Final   Enterococcus  Faecium NOT DETECTED NOT DETECTED Final   Listeria monocytogenes NOT DETECTED NOT DETECTED Final   Staphylococcus species NOT DETECTED NOT DETECTED Final   Staphylococcus aureus (BCID) NOT DETECTED NOT DETECTED Final   Staphylococcus epidermidis NOT DETECTED NOT DETECTED Final   Staphylococcus lugdunensis NOT DETECTED NOT DETECTED Final   Streptococcus species NOT DETECTED NOT DETECTED Final   Streptococcus agalactiae NOT DETECTED NOT DETECTED Final   Streptococcus pneumoniae NOT DETECTED NOT DETECTED Final   Streptococcus pyogenes NOT DETECTED NOT DETECTED Final   A.calcoaceticus-baumannii NOT DETECTED NOT DETECTED Final   Bacteroides fragilis NOT DETECTED NOT DETECTED Final   Enterobacterales NOT DETECTED NOT DETECTED Final   Enterobacter cloacae complex NOT DETECTED NOT DETECTED Final   Escherichia coli NOT DETECTED NOT DETECTED Final   Klebsiella aerogenes NOT DETECTED NOT DETECTED Final   Klebsiella oxytoca NOT DETECTED NOT DETECTED Final   Klebsiella pneumoniae NOT DETECTED NOT DETECTED Final   Proteus species NOT DETECTED NOT DETECTED Final   Salmonella species NOT DETECTED NOT DETECTED Final   Serratia marcescens NOT DETECTED NOT DETECTED Final   Haemophilus influenzae NOT DETECTED NOT DETECTED Final   Neisseria meningitidis NOT DETECTED NOT DETECTED Final   Pseudomonas aeruginosa NOT DETECTED NOT DETECTED Final    Stenotrophomonas maltophilia NOT DETECTED NOT DETECTED Final   Candida albicans NOT DETECTED NOT DETECTED Final   Candida auris NOT DETECTED NOT DETECTED Final   Candida glabrata NOT DETECTED NOT DETECTED Final   Candida krusei NOT DETECTED NOT DETECTED Final   Candida parapsilosis NOT DETECTED NOT DETECTED Final   Candida tropicalis NOT DETECTED NOT DETECTED Final   Cryptococcus neoformans/gattii NOT DETECTED NOT DETECTED Final    Comment: Performed at [facility], [address] [place], [place] [address]  Culture, blood (Routine X 2) w Reflex to ID Panel     Status: None   Collection Time: 10/27/23  2:17 PM   Specimen: BLOOD  Result Value Ref Range Status   Specimen Description BLOOD BLOOD RIGHT ARM  Final   Special Requests   Final    Blood Culture adequate volume BOTTLES DRAWN AEROBIC ONLY   Culture   Final    NO GROWTH 5 DAYS Performed at [facility], [address] [place], [place] [address]    Report Status 11/01/2023 FINAL  Final  Culture, blood (Routine X 2) w Reflex to ID Panel     Status: None   Collection Time: 10/27/23  2:22 PM   Specimen: BLOOD  Result Value Ref Range Status   Specimen Description BLOOD BLOOD LEFT HAND  Final   Special Requests   Final    Blood Culture results may not be optimal due to an inadequate volume of blood received in culture bottles BOTTLES DRAWN AEROBIC ONLY   Culture   Final    NO GROWTH 5 DAYS Performed at [facility], [address], [place], [place] [address]    Report Status 11/01/2023 FINAL  Final    Radiology Studies:  No results found.   Scheduled Meds:    atorvastatin  40 mg Oral QHS   Chlorhexidine Gluconate Cloth  6 each Topical Daily   clopidogrel  75 mg Oral Daily   digoxin  0.125 mg Oral Daily   feeding supplement  237 mL Oral BID BM   folic acid  1 mg Oral Daily   heparin  5,000 Units Subcutaneous Q8H   hydrOXYzine  25-50 mg Oral BID   insulin aspart  0-6 Units Subcutaneous  TID WC   levETIRAcetam  500  mg Oral BID   levothyroxine  100 mcg Oral Daily   mirtazapine  15 mg Oral QHS   multivitamin  1 tablet Oral QHS   oxymetazoline  1 spray Each Nare BID   sildenafil  20 mg Oral TID   sodium chloride flush  10-40 mL Intracatheter Q12H    Continuous Infusions:       LOS: 6 days     Marcellus Scott, MD,  FACP, [facility], [facility], [facility]   [facility] [place]      To contact the attending provider between 7A-7P or the covering provider during after hours 7P-7A, please log into the web site [email or web address] and access using universal [place] password for that web site. If you do not have the password, please call the hospital operator.  11/01/2023, 11:08 AM

## 2023-11-01 NOTE — Progress Notes (Signed)
Advanced Heart Failure Rounding Note  Cardiologist: Nona Dell, MD  Chief Complaint: RV Failure Subjective:    Co-ox 54% Off Milrinone. CVP 5. 500cc + 1x occurrence UOP.  SCr improving 1.9>1.6  Feels tired/weak but breathing much improved. Sitting up in chair. No other complaints. [employer] guard at bedside.   Objective:    Weight Range: 52.3 kg Body mass index is 16.54 kg/m.   Vital Signs:   Temp:  [97.1 F (36.2 C)-98.7 F (37.1 C)] 97.1 F (36.2 C) (02/11 0716) Pulse Rate:  [90-110] 103 (02/11 0716) Resp:  [13-17] 17 (02/11 0716) BP: (104-121)/(85-89) 118/89 (02/11 0716) SpO2:  [87 %-100 %] 100 % (02/11 0716) Weight:  [52.3 kg] 52.3 kg (02/11 0419) Last BM Date : 10/28/23  Weight change: Filed Weights   10/30/23 0408 10/31/23 0424 11/01/23 0419  Weight: 51.2 kg 53 kg 52.3 kg   Intake/Output:   Intake/Output Summary (Last 24 hours) at 11/01/2023 0935 Last data filed at 11/01/2023 0800 Gross per 24 hour  Intake 730 ml  Output 300 ml  Net 430 ml    Physical Exam   CVP 2 General: Well appearing. No distress on RA Cardiac: JVP not visible. S1 and S2 present. No murmurs or rub. Extremities: Warm and dry. No rash, cyanosis.  No edema.  Neuro: Alert and oriented x3. Affect pleasant. Moves all extremities without difficulty. Lines/Devices:  RUE PICC  Telemetry   SR in 100s with intermittent trigeminy (personally reviewed)  EKG    N/A   Labs    CBC No results for input(s): "WBC", "NEUTROABS", "HGB", "HCT", "MCV", "PLT" in the last 72 hours.  Basic Metabolic Panel Recent Labs    16/10/96 0543 11/01/23 0515  NA 133* 132*  K 4.2 4.3  CL 96* 96*  CO2 26 24  GLUCOSE 121* 107*  BUN 62* 54*  CREATININE 1.85* 1.64*  CALCIUM 9.0 8.9  MG 2.1 2.1   Liver Function Tests No results for input(s): "AST", "ALT", "ALKPHOS", "BILITOT", "PROT", "ALBUMIN" in the last 72 hours. No results for input(s): "LIPASE", "AMYLASE" in the last 72 hours. Cardiac  Enzymes No results for input(s): "CKTOTAL", "CKMB", "CKMBINDEX", "TROPONINI" in the last 72 hours.  BNP: BNP (last 3 results) Recent Labs    10/26/23 1100  BNP 2,518.0*    ProBNP (last 3 results) No results for input(s): "PROBNP" in the last 8760 hours.   D-Dimer No results for input(s): "DDIMER" in the last 72 hours. Hemoglobin A1C No results for input(s): "HGBA1C" in the last 72 hours.  Fasting Lipid Panel No results for input(s): "CHOL", "HDL", "LDLCALC", "TRIG", "CHOLHDL", "LDLDIRECT" in the last 72 hours. Thyroid Function Tests No results for input(s): "TSH", "T4TOTAL", "T3FREE", "THYROIDAB" in the last 72 hours.  Invalid input(s): "FREET3"  Other results:  Imaging   No results found.  Medications:    Scheduled Medications:  atorvastatin  40 mg Oral QHS   Chlorhexidine Gluconate Cloth  6 each Topical Daily   clopidogrel  75 mg Oral Daily   digoxin  0.125 mg Oral Daily   feeding supplement  237 mL Oral BID BM   heparin  5,000 Units Subcutaneous Q8H   insulin aspart  0-6 Units Subcutaneous TID WC   levETIRAcetam  500 mg Oral BID   levothyroxine  100 mcg Oral Daily   multivitamin  1 tablet Oral QHS   oxymetazoline  1 spray Each Nare BID   sildenafil  20 mg Oral TID   sodium chloride flush  10-40 mL Intracatheter Q12H    Infusions:   PRN Medications: acetaminophen **OR** acetaminophen, albuterol, ondansetron **OR** ondansetron (ZOFRAN) IV, mouth rinse, polyethylene glycol, sodium chloride flush  Patient Profile   Rick Williams is a 58 yo male with PMH of systolic heart failure, hemorraghic stroke 2007, COPD, and hypertension. Admitted with acute hypoxic respiratory failure 2/2 pulmonary hypertension and RV failure.   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 -RHC on 2/7 with severely pre-capillary filling pressures, reduced CI and PVR of 16 Wood units. Started on Sildenafil  -Co-ox 54% Off Milrinone. CVP 5. He is not candidate for outpatient milrinone/advanced therapies given incarceration  -Continue sildenafil 20mg  TID -Continue digoxin 0.125 mg daily -Will plan to start Lasix 20 in outpatient -BP stable off midodrine -Eventual V/Q scan, PFTs, possible HiRes CT as outpatient  -Sleep study as outpatient but will depend on timing of release from jail (has upcoming court date) -CTD serologies negative thus far   Pericardial Effusion - Moderate in severity on echo 2/6, no evidence of tamponade - marker of poor prognosis.    AKI on CKD Stage 2 - baseline Cr ~1.3 - sCr improving 1.97>1.64; suspect over diuresis - follow BMP    Tricuspid Regurgitation - in the setting of RV Failure. RV and RA severely dilated - moderate to severe on echo   COPD - continue nebs per primary  Heart failure team will sign off as of 11/01/23  HF Team Medication Recommendations for Home: Atorvastatin 40 mg daily Plavix 75 mg daily Digoxin 0.125 mg daily Sildenafil 20 mg tid  Follow up as an outpatient: 11/11/23 at 9:40a  Length of Stay: 6  [place] Alliah Boulanger, NP  11/01/2023, 9:35 AM  Advanced Heart Failure Team Pager [phone] (M-F; 7a - 5p)  Please contact [facility] for night-coverage after hours (5p -7a ) and weekends on [email or web address]

## 2023-11-01 NOTE — Plan of Care (Signed)

## 2023-11-01 NOTE — Progress Notes (Signed)
SATURATION QUALIFICATIONS: (This note is used to comply with regulatory documentation for home oxygen)  Patient Saturations on Room Air at Rest = 87%  Patient Saturations on Room Air while Ambulating = 86%  Patient Saturations on 3 Liters of oxygen while Ambulating = 94%  Please briefly explain why patient needs home oxygen: Pt consistently desaturates below 92% when on room air when at rest and mobilizing. Supplemental oxygen will allow for improved activity tolerance when mobilizing s/p discharge.

## 2023-11-01 NOTE — Progress Notes (Signed)
PT Cancellation Note  Patient Details Name: [name] MRN: [id number] DOB: 23-Jun-1966   Cancelled Treatment:    Reason Eval/Treat Not Completed: Medical issues which prohibited therapy. Pt with epistaxis upon PT arrival, RN aware and preparing to attend to the pt. PT will follow up at a later time this morning.   Arlyss Gandy 11/01/2023, 8:48 AM

## 2023-11-01 NOTE — Progress Notes (Signed)
PICC removal order placed by heart failure team. [place] sent to Hospitalist Dr. Waymon Amato clarifying removal order and possible discharge date. Per MD, patient is not discharging today - possibly tomorrow. He agrees to keep PICC in place until discharge to ensure reliable access. Order cancelled at this time.   Monike Bragdon Loyola Mast, RN

## 2023-11-01 NOTE — Evaluation (Signed)
Physical Therapy Evaluation Patient Details Name: Rick Williams MRN: [id number] DOB: September 23, 1965 Today's Date: 11/01/2023  History of Present Illness  58 y.o. male presents to [facility] hospital on 10/28/2023 as a transfer from [facility] with concern for advanced heart failure. RHC on 2/7. PMH includes: HTN, CVAs, R basal ganglia ICH, and alcohol abuse.  Clinical Impression  Pt presents to PT with deficits in functional mobility, strength, power, endurance, cardiopulmonary function. Pt with epistaxis throughout the morning, stopped with gauze application initially during this eval but pt later blows his nose and evacuates a large blood clot followed by further epistaxis. Pt demonstrates generalized weakness and benefits from UE support when mobilizing to improve stability and activity tolerance. Pt requires 3L supplemental oxygen to consistently maintain oxygen saturation above 92% when mobilizing, desaturating to 86% when on room air. PT will continue to follow.        If plan is discharge home, recommend the following: A little help with walking and/or transfers;A little help with bathing/dressing/bathroom;Assistance with cooking/housework;Assist for transportation;Help with stairs or ramp for entrance   Can travel by private vehicle        Equipment Recommendations Rolling walker (2 wheels)  Recommendations for Other Services       Functional Status Assessment Patient has had a recent decline in their functional status and demonstrates the ability to make significant improvements in function in a reasonable and predictable amount of time.     Precautions / Restrictions Precautions Precautions: Fall Precaution/Restrictions Comments: monitor SpO2 Restrictions Weight Bearing Restrictions Per Provider Order: No      Mobility  Bed Mobility Overal bed mobility: Modified Independent                  Transfers Overall transfer level: Needs assistance Equipment used: Rolling  walker (2 wheels) Transfers: Sit to/from Stand Sit to Stand: Contact guard assist           General transfer comment: verbal cues for hand placement with each transfer    Ambulation/Gait Ambulation/Gait assistance: Contact guard assist Gait Distance (Feet): 60 Feet (additional trial of 25', last 1' with IV pole rather than RW) Assistive device: Rolling walker (2 wheels), IV Pole Gait Pattern/deviations: Step-through pattern Gait velocity: reduced Gait velocity interpretation: <1.31 ft/sec, indicative of household ambulator   General Gait Details: slowed step-through gait with reduced stride length, increased trunk flexion  Stairs            Wheelchair Mobility     Tilt Bed    Modified Rankin (Stroke Patients Only)       Balance Overall balance assessment: Needs assistance Sitting-balance support: No upper extremity supported, Feet supported Sitting balance-Leahy Scale: Good     Standing balance support: Single extremity supported, Reliant on assistive device for balance Standing balance-Leahy Scale: Poor                               Pertinent Vitals/Pain Pain Assessment Pain Assessment: Faces Faces Pain Scale: Hurts even more Pain Location: feet Pain Descriptors / Indicators: Sore Pain Intervention(s): Monitored during session    Home Living Family/patient expects to be discharged to:: [facility]                   Additional Comments: guard reports he will likely remain on the first level to avoid stairs    Prior Function Prior Level of Function : Independent/Modified Independent  Mobility Comments: ambulatory without DME or O2       Extremity/Trunk Assessment   Upper Extremity Assessment Upper Extremity Assessment: Generalized weakness    Lower Extremity Assessment Lower Extremity Assessment: Generalized weakness    Cervical / Trunk Assessment Cervical / Trunk Assessment: Kyphotic   Communication   Communication Communication: No apparent difficulties    Cognition Arousal: Alert Behavior During Therapy: WFL for tasks assessed/performed   PT - Cognitive impairments: Awareness, Problem solving                         Following commands: Intact       Cueing Cueing Techniques: Verbal cues     General Comments General comments (skin integrity, edema, etc.): pt on RA at the time of PT arrival, pt desats to 86% on room air with mobility, requires 3L to maintain sats consistently above 92%. Pt blows his nose at end of session despite PT cues to not do so and removes gauze along with large blood clot from L nostril. Pt with further expistaxis after, RN made aware    Exercises     Assessment/Plan    PT Assessment Patient needs continued PT services  PT Problem List Decreased strength;Decreased activity tolerance;Decreased balance;Decreased mobility;Cardiopulmonary status limiting activity;Pain       PT Treatment Interventions DME instruction;Gait training;Functional mobility training;Therapeutic exercise;Therapeutic activities;Balance training;Neuromuscular re-education;Patient/family education    PT Goals (Current goals can be found in the Care Plan section)  Acute Rehab PT Goals Patient Stated Goal: to return to independence in mobility PT Goal Formulation: With patient Time For Goal Achievement: 11/15/23 Potential to Achieve Goals: Good Additional Goals Additional Goal #1: Pt will report 0/4 DOE when ambulating for >100' on room air to demonstrate improved activity tolerance    Frequency Min 1X/week     Co-evaluation               AM-PAC PT "6 Clicks" Mobility  Outcome Measure Help needed turning from your back to your side while in a flat bed without using bedrails?: A Little Help needed moving from lying on your back to sitting on the side of a flat bed without using bedrails?: A Little Help needed moving to and from a bed to a  chair (including a wheelchair)?: A Little Help needed standing up from a chair using your arms (e.g., wheelchair or bedside chair)?: A Little Help needed to walk in hospital room?: A Little Help needed climbing 3-5 steps with a railing? : A Lot 6 Click Score: 17    End of Session Equipment Utilized During Treatment: Oxygen Activity Tolerance: Patient limited by fatigue Patient left: in bed;with call bell/phone within reach;with nursing/sitter in room Nurse Communication: Mobility status PT Visit Diagnosis: Other abnormalities of gait and mobility (R26.89);Muscle weakness (generalized) (M62.81)    Time: 6045-4098 PT Time Calculation (min) (ACUTE ONLY): 30 min   Charges:   PT Evaluation $PT Eval Low Complexity: 1 Low   PT General Charges $$ ACUTE PT VISIT: 1 Visit         Arlyss Gandy, PT, DPT Acute Rehabilitation Office [phone]   Arlyss Gandy 11/01/2023, 10:02 AM

## 2023-11-01 NOTE — TOC Progression Note (Signed)
Transition of Care [facility]) - Progression Note    Patient Details  Name: Rick Williams MRN: [id number] Date of Birth: 1965-09-22  [facility] CM/SW Contact  Leone Haven, RN Phone Number: 11/01/2023, 10:29 AM  Clinical Narrative:    This N[facility], she states to fax her the scripts for the expensive meds sildenafil and digoxin, this NCM faxed scripts to [phone].  Per MD patient had nose bleed today and will dc tomorrow.    Expected Discharge Plan: [facility] Barriers to Discharge: Continued Medical Work up  Expected Discharge Plan and Services   Discharge Planning Services: CM Consult   Living arrangements for the past 2 months: [facility]                                       Social Determinants of Health (SDOH) Interventions SDOH Screenings   Food Insecurity: Patient Declined (10/26/2023)  Housing: Unknown (10/26/2023)  Transportation Needs: No Transportation Needs (10/26/2023)  Utilities: Patient Declined (10/26/2023)  Tobacco Use: High Risk (10/26/2023)    Readmission Risk Interventions    10/27/2023    1:29 PM  Readmission Risk Prevention Plan  Transportation Screening Complete  Home Care Screening Complete  Medication Review (RN CM) Complete

## 2023-11-02 DIAGNOSIS — I5031 Acute diastolic (congestive) heart failure: Secondary | ICD-10-CM | POA: Diagnosis not present

## 2023-11-02 LAB — MAGNESIUM: Magnesium: 1.8 mg/dL (ref 1.7–2.4)

## 2023-11-02 LAB — BASIC METABOLIC PANEL
Anion gap: 20 — ABNORMAL HIGH (ref 5–15)
BUN: 39 mg/dL — ABNORMAL HIGH (ref 6–20)
CO2: 25 mmol/L (ref 22–32)
Calcium: 10.2 mg/dL (ref 8.9–10.3)
Chloride: 91 mmol/L — ABNORMAL LOW (ref 98–111)
Creatinine, Ser: 1.32 mg/dL — ABNORMAL HIGH (ref 0.61–1.24)
GFR, Estimated: >60 mL/min (ref >60)
Glucose, Bld: 116 mg/dL — ABNORMAL HIGH (ref 70–99)
Potassium: 4 mmol/L (ref 3.5–5.1)
Sodium: 136 mmol/L (ref 135–145)

## 2023-11-02 LAB — CBC
HCT: 40.8 % (ref 39.0–52.0)
Hemoglobin: 13.4 g/dL (ref 13.0–17.0)
MCH: 27.2 pg (ref 26.0–34.0)
MCHC: 32.8 g/dL (ref 30.0–36.0)
MCV: 82.9 fL (ref 80.0–100.0)
Platelets: 333 10*3/uL (ref 150–400)
RBC: 4.92 MIL/uL (ref 4.22–5.81)
RDW: 16.6 % — ABNORMAL HIGH (ref 11.5–15.5)
WBC: 13.9 10*3/uL — ABNORMAL HIGH (ref 4.0–10.5)
nRBC: 0.2 % (ref 0.0–0.2)

## 2023-11-02 LAB — COOXEMETRY PANEL
Carboxyhemoglobin: 2.5 % — ABNORMAL HIGH (ref 0.5–1.5)
Methemoglobin: <0.7 % (ref 0.0–1.5)
O2 Saturation: 50.8 %
Total hemoglobin: 13.7 g/dL (ref 12.0–16.0)

## 2023-11-02 LAB — GLUCOSE, CAPILLARY: Glucose-Capillary: 111 mg/dL — ABNORMAL HIGH (ref 70–99)

## 2023-11-02 MED ORDER — CLOPIDOGREL BISULFATE 75 MG PO TABS: 75.0000 mg | ORAL_TABLET | Freq: Every day | ORAL | 1 refills | Status: AC

## 2023-11-02 MED ORDER — MIRTAZAPINE 15 MG PO TABS: 15.0000 mg | ORAL_TABLET | Freq: Every day | ORAL | 1 refills | Status: AC

## 2023-11-02 MED ORDER — ATORVASTATIN CALCIUM 40 MG PO TABS: 40.0000 mg | ORAL_TABLET | Freq: Every day | ORAL | 3 refills | Status: AC

## 2023-11-02 MED ORDER — DIGOXIN 125 MCG PO TABS: 0.1250 mg | ORAL_TABLET | Freq: Every day | ORAL | 2 refills | Status: AC

## 2023-11-02 MED ORDER — LEVOTHYROXINE SODIUM 100 MCG PO TABS: 100.0000 ug | ORAL_TABLET | Freq: Every day | ORAL | 1 refills | Status: AC

## 2023-11-02 MED ORDER — FOLIC ACID 1 MG PO TABS: 1.0000 mg | ORAL_TABLET | Freq: Every day | ORAL | 0 refills | Status: AC

## 2023-11-02 MED ORDER — ALBUTEROL SULFATE HFA 108 (90 BASE) MCG/ACT IN AERS
2.0000 | INHALATION_SPRAY | Freq: Four times a day (QID) | RESPIRATORY_TRACT | 1 refills | Status: AC | PRN
Start: 2023-11-02 — End: ?

## 2023-11-02 MED ORDER — SILDENAFIL CITRATE 20 MG PO TABS: 20.0000 mg | ORAL_TABLET | Freq: Three times a day (TID) | ORAL | 2 refills | Status: AC

## 2023-11-02 MED ORDER — LEVETIRACETAM 500 MG PO TABS: 500.0000 mg | ORAL_TABLET | Freq: Two times a day (BID) | ORAL | 1 refills | Status: AC

## 2023-11-02 MED ORDER — HYDROXYZINE PAMOATE 25 MG PO CAPS: 25.0000 mg | ORAL_CAPSULE | Freq: Three times a day (TID) | ORAL | 1 refills | Status: AC | PRN

## 2023-11-02 NOTE — Discharge Summary (Signed)
 Physician Discharge Summary  AARO MEYERS RJJ:[id number] DOB: Mar 25, 1966 DOA: 10/26/2023  PCP: Patient, No Pcp Per  Admit date: 10/26/2023  Discharge date: 11/02/2023  Admitted From: [facility].  Disposition:  [facility].  Recommendations for Outpatient Follow-up:  Follow up with PCP in 1-2 weeks. Please obtain BMP/CBC in one week. Advised to follow up Advanced Heart failure team as scheduled. Advised medication compliance with patient.  Home Health: None Equipment/Devices:None  Discharge Condition: Stable CODE STATUS:Full code Diet recommendation: Heart Healthy   Brief [facility] Course: This 58 year old currently incarcerated male, medical history significant for HTN, COPD, prior tobacco and alcohol use disorder, CVA in 2007, cerebral hemorrhage in 2014, chronic diastolic CHF, hypothyroidism and prediabetes, initially presented to the [facility] with complaints of difficulty breathing and cough over the past several days.  He reportedly has been at the [facility] jail since 05/22/2023.  On presentation, creatinine 1.7, troponin 25 and BNP 2518.  Admitted for pulmonary hypertension, acute right heart failure, pericardial effusion.  Transferred to [facility]  on 2/7, evaluated by Advanced Heart failure cardiology team , He underwent RHC, initiated on IV Lasix and milrinone drip for a couple of days, discontinued  milrinone and lasix 2/9, cleared for DC by cardiology.  However patient had an episode of bilateral epistaxis on 2/11, He was monitored closely overnight and No further epistaxis noted. Patient is back to jail today 2/12. He is medically ready for discharge.    Discharge Diagnoses:  Principal Problem:   Acute diastolic CHF (congestive heart failure) (HCC) Active Problems:   AKI (acute kidney injury) (HCC)   Essential hypertension, malignant   Alcohol abuse   Severe pulmonary hypertension (HCC)   Protein-calorie  malnutrition, severe  Acute on chronic right heart failure/severe pulmonary hypertension: Presented with worsening dyspnea and BNP of 2518 2D echo: LVEF 65 to 70%, severe asymmetric LVH, grade 1 diastolic dysfunction.  Interventricular septum is flattened in systole and diastole, consistent with right ventricular pressure and volume overload.  RV systolic function severely reduced.  Moderate pericardial effusion without cardiac tamponade.  Severe pulmonary hypertension CTA chest: No Pulmonary Embolism. RHC 2/7: Severely reduced cardiac output & index by Fick & TD. Severely elevated PVR.  Treated with several days of IV Lasix 80 mg twice daily.  -7.3 L thus far.  Weight down from 154 on admission to 115 pounds. He was started on digoxin 0.125 Mg daily, sildenafil 20 Mg 3 times daily for pulmonary hypertension and milrinone drip.  Midodrine added and discontinued. Cardiology follow-up appreciated.  IV Lasix and milrinone drip were discontinued 10/31/23.  Cardiology signed off and cleared for discharge home with following DC med recommendations and have arranged office visit on 11/11/2023.   Epistaxis, B/L, right > left. Unclear etiology.  Could be related to [place] oxygen Added Afrin nasal spray. Right nose packed with cotton by RN. Appears to have resolved. Monitor closely.  Did not appear to be large volume.   HF Team Medication Recommendations for Home: Lasix 20 Mg daily Atorvastatin 40 mg daily Plavix 75 mg daily Digoxin 0.125 mg daily Sildenafil 20 mg tid   Acute hypoxic respiratory failure > Resolved. Does have hypoxia but acute respiratory failure ruled out, did not meet criteria No sustained tachypnea or respiratory distress Multifactorial due to acute right heart failure, pulmonary hypertension, COPD  Pericardial effusion Seen on CTA chest and 2D echo, without clinical or imaging tamponade. Management per cardiology   Essential hypertension PTA antihypertensives (amlodipine,  carvedilol and clonidine) discontinued by cardiology.   Blood pressures currently controlled on above heart failure meds alone.   Hyperlipidemia Continue atorvastatin 40 Mg at bedtime.   Hypothyroidism TSH 1.408 Continue levothyroxine 100 mcg daily.   Bacteremia ruled out There was a report called about gram-positive cocci on blood cultures which subsequently was redacted.   Briefly on vancomycin which was stopped. Blood cultures negative to date.   Hypothermia: Upon chart review, the temperature of 94.9 recorded on 2/5 at [facility] ED may have been inaccurate.   No infectious etiology.  TSH normal. Resolved.   COPD exacerbation: Versus cardiac asthma Briefly on IV steroids. Resolved.   Acute kidney injury complicating stage II CKD :  Baseline creatinine in September of last year was in the 1.2-1.4 range. Since hospital admission, creatinine has mostly been in the 1.7 range.  This may be either AKI or progressed CKD.   Creatinine has improved from 1.85-1.64 likely due to discontinuing the high-dose IV Lasix.   Avoid nephrotoxins. Renal functions back to baseline.   Lymphadenopathy CTA chest showed mild mediastinal, bilateral hilar, left axillary and gastrohepatic ligament lymphadenopathy which was reported as nonspecific Unclear etiology Recommend outpatient follow-up as deemed necessary.   History of alcohol and tobacco use disorder Patient denies recent use and has been incarcerated since 9/1.   Anemia of chronic disease Mild.  Stable in the 12 g range.   Prolonged QTc EKG 2/5: QTc 502 ms Maintain K >4 and magnesium >2.  Appropriate today. Minimize QT prolonging medications.   Type 2 diabetes mellitus A1c 2/8: 6.9. SSI.  Reasonable inpatient control.    History of prior stroke, ICH,?  Seizure Was on Plavix 75 Mg daily and Keppra PTA, continue.   Body mass index is 16.54 kg/m.   Nutrition Status: Nutrition Problem: Severe Malnutrition Etiology: chronic illness  (COPD, CHF, CKD) Signs/Symptoms: severe fat depletion, severe muscle depletion Interventions: Ensure Enlive (each supplement provides 350kcal and 20 grams of protein), Magic cup, MVI Appreciate and agree with registered dietitian input as above.  Follow recommendations. Resumed prior home dose of folate and Remeron to stimulate appetite.  Discharge Instructions  Discharge Instructions     Call MD for:  difficulty breathing, headache or visual disturbances   Complete by: As directed    Call MD for:  persistant dizziness or light-headedness   Complete by: As directed    Call MD for:  persistant nausea and vomiting   Complete by: As directed    Diet - low sodium heart healthy   Complete by: As directed    Diet Carb Modified   Complete by: As directed    Discharge instructions   Complete by: As directed    Advised to follow-up with primary care physician in 1 week. Advised to follow-up with advanced heart failure team follow-up in 1 week. Advised medication compliance with the patient.   Discharge wound care:   Complete by: As directed    Follow up in jail.   Increase activity slowly   Complete by: As directed       Allergies as of 11/02/2023       Reactions   Benadryl [diphenhydramine Hcl (sleep)] Itching, Rash        Medication List     STOP taking these medications    amLODipine 10 MG tablet Commonly known as: NORVASC   carvedilol 6.25 MG tablet Commonly known as: COREG   cloNIDine 0.2 MG tablet Commonly known as: CATAPRES  TAKE these medications    albuterol 108 (90 Base) MCG/ACT inhaler Commonly known as: VENTOLIN HFA Inhale 2 puffs into the lungs every 6 (six) hours as needed for wheezing or shortness of breath.   atorvastatin 40 MG tablet Commonly known as: LIPITOR Take 1 tablet (40 mg total) by mouth at bedtime.   clopidogrel 75 MG tablet Commonly known as: PLAVIX Take 1 tablet (75 mg total) by mouth daily.   Daily-Vite Tabs Take 1  tablet by mouth daily.   digoxin 0.125 MG tablet Commonly known as: LANOXIN Take 1 tablet (0.125 mg total) by mouth daily.   Ensure Nutrition Shake Liqd Take 237 mLs by mouth in the morning and at bedtime.   folic acid 1 MG tablet Commonly known as: FOLVITE Take 1 tablet (1 mg total) by mouth daily.   hydrOXYzine 25 MG capsule Commonly known as: VISTARIL Take 1 capsule (25 mg total) by mouth every 8 (eight) hours as needed for anxiety. What changed:  how much to take when to take this reasons to take this additional instructions   levETIRAcetam 500 MG tablet Commonly known as: KEPPRA Take 1 tablet (500 mg total) by mouth 2 (two) times daily.   levothyroxine 100 MCG tablet Commonly known as: SYNTHROID Take 1 tablet (100 mcg total) by mouth daily.   mirtazapine 15 MG tablet Commonly known as: REMERON Take 1 tablet (15 mg total) by mouth at bedtime.   sildenafil 20 MG tablet Commonly known as: REVATIO Take 1 tablet (20 mg total) by mouth 3 (three) times daily.   triamcinolone cream 0.1 % Commonly known as: KENALOG Apply 1 Application topically 2 (two) times daily. Apply to feet and legs               Discharge Care Instructions  (From admission, onward)           Start     Ordered   11/02/23 0000  Discharge wound care:       Comments: Follow up in jail.   11/02/23 1052            Follow-up Information     [facility] Follow up on 11/11/2023.   Specialty: Cardiology Why: Dr. Gasper Lloyd 9:40 AM Entrance C Please bring med list to appointment Contact information: [address] [place] [address] [phone]               Allergies  Allergen Reactions   Benadryl [Diphenhydramine Hcl (Sleep)] Itching and Rash    Consultations: Cardiology   Procedures/Studies: CARDIAC CATHETERIZATION Result Date: 10/28/2023 HEMODYNAMICS: RA:   20 mmHg with steep Y descents RV:    85/2-20 mmHg PA:   87/44 mmHg (58 mean) PCWP:  12 mmHg (mean)    Estimated Fick CO/CI   2.8 L/min, 1.6 L/min/m2 Thermodilution CO/CI   2.7 L/min, 1.6 L/min/m2    TPG    46  mmHg     PVR     16 Wood Units PAPi      2.2  IMPRESSION: Severely elevated pre capillary pressures Normal post capillary filling pressures Severely reduced cardiac output & index by Fick & TD Severely elevated PVR. Aditya Sabharwal 5:35 PM  [place] EKG SITE RITE Result Date: 10/28/2023 If [facility] image not attached, placement could not be confirmed due to current cardiac rhythm.  ECHOCARDIOGRAM COMPLETE Result Date: 10/27/2023    ECHOCARDIOGRAM REPORT   Patient Name:   Rick Williams Date of Exam:  10/27/2023 Medical Rec #:  [id number]          Height:       69.0 in Accession #:    [id number]         Weight:       154.3 lb Date of Birth:  02-13-1966          BSA:          1.850 m Patient Age:    57 years           BP:           101/87 mmHg Patient Gender: M                  HR:           64 bpm. [facility]am [facility] Procedure: 2D Echo, Cardiac Doppler and Color Doppler Indications:    CHF-Acute Diastolic I50.31  History:        Patient has prior history of Echocardiogram examinations, most                 recent 01/20/2020. CHF; Risk Factors:Hypertension. ETOH abuse.  Sonographer:    Celesta Gentile RCS Referring Phys: [id number] Heloise Beecham EMOKPAE IMPRESSIONS  1. Left ventricular ejection fraction, by estimation, is 65 to 70%. The left ventricle has normal function. The left ventricle has no regional wall motion abnormalities. There is severe asymmetric left ventricular hypertrophy of the inferior and lateral  segments. Left ventricular diastolic parameters are consistent with Grade I diastolic dysfunction (impaired relaxation). There is the interventricular septum is flattened in systole and diastole, consistent with right ventricular pressure and volume overload.  2. Right ventricular systolic function is severely reduced. The right  ventricular size is severely enlarged and heavily trabeculated. Mildly increased right ventricular wall thickness. There is severely elevated pulmonary artery systolic pressure. The estimated right ventricular systolic pressure is 107.9 mmHg.  3. Left atrial size was upper normal.  4. Right atrial size was severely dilated.  5. Moderate pericardial effusion. The pericardial effusion is posterior and lateral to the left ventricle. There is no evidence of cardiac tamponade.  6. The tricuspid valve is abnormal. Tricuspid valve regurgitation is moderate to severe.  7. The aortic valve is tricuspid. Aortic valve regurgitation is not visualized.  8. The inferior vena cava is dilated in size with <50% respiratory variability, suggesting right atrial pressure of 15 mmHg.  9. The mitral valve is grossly normal. Trivial mitral valve regurgitation. Comparison(s): Prior images reviewed side by side. RV dysfunction and severe pulmonary hypertension are new in comparison. FINDINGS  Left Ventricle: Left ventricular ejection fraction, by estimation, is 65 to 70%. The left ventricle has normal function. The left ventricle has no regional wall motion abnormalities. The left ventricular internal cavity size was small. There is severe asymmetric left ventricular hypertrophy of the inferior and lateral segments. The interventricular septum is flattened in systole and diastole, consistent with right ventricular pressure and volume overload. Left ventricular diastolic parameters are consistent with Grade I diastolic dysfunction (impaired relaxation). Right Ventricle: The right ventricular size is severely enlarged. Mildly increased right ventricular wall thickness. Right ventricular systolic function is severely reduced. There is severely elevated pulmonary artery systolic pressure. The tricuspid regurgitant velocity is 4.82 m/s, and with an assumed right atrial pressure of 15 mmHg, the estimated right ventricular systolic pressure is  107.9 mmHg. Left Atrium: Left atrial size was upper normal. Right Atrium: Right atrial size was severely dilated. Pericardium: A moderately sized  pericardial effusion is present. The pericardial effusion is posterior and lateral to the left ventricle. There is no evidence of cardiac tamponade. Mitral Valve: The mitral valve is grossly normal. Trivial mitral valve regurgitation. Tricuspid Valve: The tricuspid valve is abnormal. Tricuspid valve regurgitation is moderate to severe. Aortic Valve: The aortic valve is tricuspid. Aortic valve regurgitation is not visualized. Pulmonic Valve: The pulmonic valve was grossly normal. Pulmonic valve regurgitation is trivial. Aorta: The aortic root is normal in size and structure. Venous: The inferior vena cava is dilated in size with less than 50% respiratory variability, suggesting right atrial pressure of 15 mmHg. IAS/Shunts: No atrial level shunt detected by color flow Doppler.  LEFT VENTRICLE PLAX 2D LVIDd:         2.80 cm   Diastology LVIDs:         1.70 cm   LV e' medial:    5.33 cm/s LV PW:         1.60 cm   LV E/e' medial:  10.8 LV IVS:        1.20 cm   LV e' lateral:   8.38 cm/s LVOT diam:     2.20 cm   LV E/e' lateral: 6.9 LV SV:         40 LV SV Index:   22 LVOT Area:     3.80 cm  RIGHT VENTRICLE RV S prime:     10.70 cm/s TAPSE (M-mode): 1.4 cm LEFT ATRIUM             Index        RIGHT ATRIUM           Index LA diam:        3.50 cm 1.89 cm/m   RA Area:     36.90 cm LA Vol (A2C):   64.2 ml 34.72 ml/m  RA Volume:   173.00 ml 93.49 ml/m LA Vol (A4C):   56.3 ml 30.43 ml/m LA Biplane Vol: 63.4 ml 34.26 ml/m  AORTIC VALVE LVOT Vmax:   78.30 cm/s LVOT Vmean:  47.400 cm/s LVOT VTI:    0.106 m  AORTA Ao Root diam: 3.20 cm MITRAL VALVE               TRICUSPID VALVE MV Area (PHT): 1.46 cm    TR Peak grad:   92.9 mmHg MV Decel Time: 521 msec    TR Vmax:        482.00 cm/s MV E velocity: 57.80 cm/s MV A velocity: 68.20 cm/s  SHUNTS MV E/A ratio:  0.85        Systemic  VTI:  0.11 m                            Systemic Diam: 2.20 cm Nona Dell MD Electronically signed by Nona Dell MD Signature Date/Time: 10/27/2023/5:15:36 PM    Final    CT Angio Chest PE W and/or Wo Contrast Result Date: 10/26/2023 CLINICAL DATA:  Shortness of breath. Clinical concern for pulmonary embolism. EXAM: CT ANGIOGRAPHY CHEST WITH CONTRAST TECHNIQUE: Multidetector CT imaging of the chest was performed using the standard protocol during bolus administration of intravenous contrast. Multiplanar CT image reconstructions and MIPs were obtained to evaluate the vascular anatomy. RADIATION DOSE REDUCTION: This exam was performed according to the departmental dose-optimization program which includes automated exposure control, adjustment of the mA and/or kV according to patient size and/or use of iterative reconstruction technique. CONTRAST:  75mL  OMNIPAQUE IOHEXOL 350 MG/ML SOLN COMPARISON:  Chest radiographs obtained earlier today. FINDINGS: Cardiovascular: Normally opacified pulmonary arteries with no pulmonary arterial filling defects seen. Enlarged heart moderate-sized pericardial effusion with a maximum thickness of 2.6 cm. Atheromatous calcifications, including the coronary arteries and aorta. Dilated central pulmonary arteries with a main pulmonary artery diameter of 3.8 cm. Mediastinum/Nodes: Enlarged mediastinal and bilateral hilar lymph nodes. These include a right inferior paratracheal node with a short axis diameter of 10 mm on image number 43/4, AP window lymph node with a short axis diameter of 10 mm on image number 43/4, right hilar node with a short axis diameter of 17 mm on image number 51/4 and subcarinal node with a short axis diameter of 10 mm on image number 57/4. Borderline enlarged left axillary nodes, the largest measuring 8 mm in short axis diameter on image number 39/4 mildly enlarged gastrohepatic ligament lymph node with a short axis diameter of 10 mm on image number 105/4.  Unremarkable thyroid gland, trachea and esophagus. Lungs/Pleura: Prominent pulmonary vasculature and mildly prominent interstitial markings, especially in the lower lung zones. Small amount of patchy density in the right lower lobe. No pleural fluid. Upper Abdomen: Mildly dilated inferior vena cava and hepatic veins with reflux of contrast from the right atrium into the hepatic veins Musculoskeletal: Mild dextroconvex thoracic scoliosis. Lower cervical spine degenerative changes. Review of the MIP images confirms the above findings. IMPRESSION: 1. No pulmonary embolism. 2. Cardiomegaly with a moderate-sized pericardial effusion, mild pulmonary vascular congestion and mild interstitial pulmonary edema, consistent with congestive heart failure. 3. Small amount of patchy density in the right lower lobe, which could represent atelectasis, pneumonia or focal alveolar edema. 4. Dilated central pulmonary arteries, consistent with pulmonary arterial hypertension. 5. Mild mediastinal, bilateral hilar, left axillary and gastrohepatic ligament lymphadenopathy. This is nonspecific. 6.  Calcific coronary artery and aortic atherosclerosis. Aortic Atherosclerosis (ICD10-I70.0). Electronically Signed   By: Beckie Salts M.D.   On: 10/26/2023 14:33   DG Chest 2 View Result Date: 10/26/2023 CLINICAL DATA:  Shortness of breath. EXAM: CHEST - 2 VIEW COMPARISON:  05/25/2023. FINDINGS: Redemonstration of linear area of scarring/atelectasis in the right lung apex. Bilateral lung fields are otherwise clear. No acute consolidation or lung collapse. No pulmonary edema. Bilateral costophrenic angles are clear. Mildly enlarged cardio-mediastinal silhouette, which is likely due to AP technique. No acute osseous abnormalities. The soft tissues are within normal limits. IMPRESSION: *No active cardiopulmonary disease. Electronically Signed   By: Jules Schick M.D.   On: 10/26/2023 11:39   Subjective: Patient was seen and examined at bedside.   Overnight events noted.   Patient reports feeling much improved and wants to be discharged.  Discharge Exam: Vitals:   11/02/23 0444 11/02/23 0725  BP: (!) 115/92 (!) 127/96  Pulse: (!) 107 (!) 103  Resp: 20 17  Temp: 98.2 F (36.8 C) 97.8 F (36.6 C)  SpO2: 92% 95%   Vitals:   11/01/23 1918 11/02/23 0208 11/02/23 0444 11/02/23 0725  BP: (!) 116/91 (!) 120/92 (!) 115/92 (!) 127/96  Pulse: (!) 103 (!) 112 (!) 107 (!) 103  Resp: 15 20 20 17   Temp: 98 F (36.7 C) 98.1 F (36.7 C) 98.2 F (36.8 C) 97.8 F (36.6 C)  TempSrc: Oral Oral Oral Oral  SpO2: 94% 90% 92% 95%  Weight:   52.5 kg   Height:        General: Pt is alert, awake, not in acute distress Cardiovascular: RRR, S1/S2 +, no  rubs, no gallops Respiratory: CTA bilaterally, no wheezing, no rhonchi Abdominal: Soft, NT, ND, bowel sounds + Extremities: no edema, no cyanosis    The results of significant diagnostics from this hospitalization (including imaging, microbiology, ancillary and laboratory) are listed below for reference.     Microbiology: Recent Results (from the past 240 hours)  Resp panel by RT-PCR (RSV, Flu A&B, Covid) Anterior Nasal Swab     Status: None   Collection Time: 10/26/23  9:56 AM   Specimen: Anterior Nasal Swab  Result Value Ref Range Status   SARS Coronavirus 2 by RT PCR NEGATIVE NEGATIVE Final    Comment: (NOTE) SARS-CoV-2 target nucleic acids are NOT DETECTED.  The SARS-CoV-2 RNA is generally detectable in upper respiratory specimens during the acute phase of infection. The lowest concentration of SARS-CoV-2 viral copies this assay can detect is 138 copies/mL. A negative result does not preclude SARS-Cov-2 infection and should not be used as the sole basis for treatment or other patient management decisions. A negative result may occur with  improper specimen collection/handling, submission of specimen other than nasopharyngeal swab, presence of viral mutation(s) within the areas  targeted by this assay, and inadequate number of viral copies(<138 copies/mL). A negative result must be combined with clinical observations, patient history, and epidemiological information. The expected result is Negative.  Fact Sheet for Patients:  BloggerCourse.com  Fact Sheet for Healthcare Providers:  SeriousBroker.it  This test is no t yet approved or cleared by the [place] FDA and  has been authorized for detection and/or diagnosis of SARS-CoV-2 by FDA under an Emergency Use Authorization (EUA). This EUA will remain  in effect (meaning this test can be used) for the duration of the COVID-19 declaration under Section 564(b)(1) of the Act, 21 U.S.C.section 360bbb-3(b)(1), unless the authorization is terminated  or revoked sooner.       Influenza A by PCR NEGATIVE NEGATIVE Final   Influenza B by PCR NEGATIVE NEGATIVE Final    Comment: (NOTE) The Xpert Xpress SARS-CoV-2/FLU/RSV plus assay is intended as an aid in the diagnosis of influenza from Nasopharyngeal swab specimens and should not be used as a sole basis for treatment. Nasal washings and aspirates are unacceptable for Xpert Xpress SARS-CoV-2/FLU/RSV testing.  Fact Sheet for Patients: BloggerCourse.com  Fact Sheet for Healthcare Providers: SeriousBroker.it  This test is not yet approved or cleared by the [place] FDA and has been authorized for detection and/or diagnosis of SARS-CoV-2 by FDA under an Emergency Use Authorization (EUA). This EUA will remain in effect (meaning this test can be used) for the duration of the COVID-19 declaration under Section 564(b)(1) of the Act, 21 U.S.C. section 360bbb-3(b)(1), unless the authorization is terminated or revoked.     Resp Syncytial Virus by PCR NEGATIVE NEGATIVE Final    Comment: (NOTE) Fact Sheet for  Patients: BloggerCourse.com  Fact Sheet for Healthcare Providers: SeriousBroker.it  This test is not yet approved or cleared by the [place] FDA and has been authorized for detection and/or diagnosis of SARS-CoV-2 by FDA under an Emergency Use Authorization (EUA). This EUA will remain in effect (meaning this test can be used) for the duration of the COVID-19 declaration under Section 564(b)(1) of the Act, 21 U.S.C. section 360bbb-3(b)(1), unless the authorization is terminated or revoked.  Performed at [facility], [address], [place], [place] [address]   Blood culture (routine x 2)     Status: None   Collection Time: 10/26/23 10:35 AM   Specimen: BLOOD  Result  Value Ref Range Status   Specimen Description   Final    BLOOD BLOOD LEFT ARM Performed at [facility], [address] [place], [place] [address]    Special Requests   Final    BOTTLES DRAWN AEROBIC AND ANAEROBIC Blood Culture adequate volume Performed at [facility], [address], [place], [place] [address]    Culture  Setup Time   Final    NO ORGANISMS SEEN AEROBIC BOTTLE ONLY Gram Stain Report Called to,Read Back By and Verified With: Halford Decamp RN 1914 [phone] K FORSYTH PREVIOUSLY REPORTED AS: GRAM POSITIVE COCCI CORRECTED RESULTS CALLED TO: PHARMD S HALL [id number] AT 1125 BY CM    Culture   Final    NO GROWTH Performed at [facility], [address] [place], [place] [address]    Report Status 10/28/2023 FINAL  Final  Blood culture (routine x 2)     Status: None   Collection Time: 10/26/23 10:35 AM   Specimen: BLOOD  Result Value Ref Range Status   Specimen Description BLOOD BLOOD RIGHT ARM AEROBIC BOTTLE ONLY  Final   Special Requests   Final    Blood Culture adequate volume BOTTLES DRAWN AEROBIC ONLY   Culture   Final    NO GROWTH 5 DAYS Performed at [facility], [address], [place], [place] [address]    Report Status 10/31/2023 FINAL   Final  Blood Culture ID Panel (Reflexed)     Status: None   Collection Time: 10/26/23 10:35 AM  Result Value Ref Range Status   Enterococcus faecalis NOT DETECTED NOT DETECTED Final   Enterococcus Faecium NOT DETECTED NOT DETECTED Final   Listeria monocytogenes NOT DETECTED NOT DETECTED Final   Staphylococcus species NOT DETECTED NOT DETECTED Final   Staphylococcus aureus (BCID) NOT DETECTED NOT DETECTED Final   Staphylococcus epidermidis NOT DETECTED NOT DETECTED Final   Staphylococcus lugdunensis NOT DETECTED NOT DETECTED Final   Streptococcus species NOT DETECTED NOT DETECTED Final   Streptococcus agalactiae NOT DETECTED NOT DETECTED Final   Streptococcus pneumoniae NOT DETECTED NOT DETECTED Final   Streptococcus pyogenes NOT DETECTED NOT DETECTED Final   A.calcoaceticus-baumannii NOT DETECTED NOT DETECTED Final   Bacteroides fragilis NOT DETECTED NOT DETECTED Final   Enterobacterales NOT DETECTED NOT DETECTED Final   Enterobacter cloacae complex NOT DETECTED NOT DETECTED Final   Escherichia coli NOT DETECTED NOT DETECTED Final   Klebsiella aerogenes NOT DETECTED NOT DETECTED Final   Klebsiella oxytoca NOT DETECTED NOT DETECTED Final   Klebsiella pneumoniae NOT DETECTED NOT DETECTED Final   Proteus species NOT DETECTED NOT DETECTED Final   Salmonella species NOT DETECTED NOT DETECTED Final   Serratia marcescens NOT DETECTED NOT DETECTED Final   Haemophilus influenzae NOT DETECTED NOT DETECTED Final   Neisseria meningitidis NOT DETECTED NOT DETECTED Final   Pseudomonas aeruginosa NOT DETECTED NOT DETECTED Final   Stenotrophomonas maltophilia NOT DETECTED NOT DETECTED Final   Candida albicans NOT DETECTED NOT DETECTED Final   Candida auris NOT DETECTED NOT DETECTED Final   Candida glabrata NOT DETECTED NOT DETECTED Final   Candida krusei NOT DETECTED NOT DETECTED Final   Candida parapsilosis NOT DETECTED NOT DETECTED Final   Candida tropicalis NOT DETECTED NOT DETECTED Final    Cryptococcus neoformans/gattii NOT DETECTED NOT DETECTED Final    Comment: Performed at [facility], [address], [place], [place] [address]  Culture, blood (Routine X 2) w Reflex to ID Panel     Status: None  Collection Time: 10/27/23  2:17 PM   Specimen: BLOOD  Result Value Ref Range Status   Specimen Description BLOOD BLOOD RIGHT ARM  Final   Special Requests   Final    Blood Culture adequate volume BOTTLES DRAWN AEROBIC ONLY   Culture   Final    NO GROWTH 5 DAYS Performed at [facility], [address], [place], [place] [address]    Report Status 11/01/2023 FINAL  Final  Culture, blood (Routine X 2) w Reflex to ID Panel     Status: None   Collection Time: 10/27/23  2:22 PM   Specimen: BLOOD  Result Value Ref Range Status   Specimen Description BLOOD BLOOD LEFT HAND  Final   Special Requests   Final    Blood Culture results may not be optimal due to an inadequate volume of blood received in culture bottles BOTTLES DRAWN AEROBIC ONLY   Culture   Final    NO GROWTH 5 DAYS Performed at [facility], [address] [place], [place] [address]    Report Status 11/01/2023 FINAL  Final     Labs: BNP (last 3 results) Recent Labs    10/26/23 1100  BNP 2,518.0*   Basic Metabolic Panel: Recent Labs  Lab 10/29/23 0108 10/30/23 0418 10/31/23 0543 11/01/23 0515 11/02/23 0429  NA 137 136 133* 132* 136  K 4.4 3.5 4.2 4.3 4.0  CL 103 97* 96* 96* 91*  CO2 22 28 26 24 25   GLUCOSE 125* 118* 121* 107* 116*  BUN 50* 57* 62* 54* 39*  CREATININE 1.75* 1.81* 1.85* 1.64* 1.32*  CALCIUM 8.7* 8.9 9.0 8.9 10.2  MG 2.6* 2.1 2.1 2.1 1.8   Liver Function Tests: Recent Labs  Lab 10/27/23 0405  AST 23  ALT 15  ALKPHOS 80  BILITOT 0.6  PROT 8.5*  ALBUMIN 2.9*   No results for input(s): "LIPASE", "AMYLASE" in the last 168 hours. No results for input(s): "AMMONIA" in the last 168 hours. CBC: Recent Labs  Lab 10/27/23 0405 10/28/23 1700 11/02/23 0429  WBC 8.3  --  13.9*   HGB 12.0* 12.2*  12.2* 13.4  HCT 37.0* 36.0*  36.0* 40.8  MCV 84.3  --  82.9  PLT 302  --  333   Cardiac Enzymes: No results for input(s): "CKTOTAL", "CKMB", "CKMBINDEX", "TROPONINI" in the last 168 hours. BNP: Invalid input(s): "POCBNP" CBG: Recent Labs  Lab 11/01/23 0626 11/01/23 1116 11/01/23 1512 11/01/23 2101 11/02/23 0556  GLUCAP 97 148* 118* 109* 111*   D-Dimer No results for input(s): "DDIMER" in the last 72 hours. Hgb A1c No results for input(s): "HGBA1C" in the last 72 hours. Lipid Profile No results for input(s): "CHOL", "HDL", "LDLCALC", "TRIG", "CHOLHDL", "LDLDIRECT" in the last 72 hours. Thyroid function studies No results for input(s): "TSH", "T4TOTAL", "T3FREE", "THYROIDAB" in the last 72 hours.  Invalid input(s): "FREET3" Anemia work up No results for input(s): "VITAMINB12", "FOLATE", "FERRITIN", "TIBC", "IRON", "RETICCTPCT" in the last 72 hours. Urinalysis    Component Value Date/Time   COLORURINE STRAW (A) 10/26/2023 2020   APPEARANCEUR CLEAR 10/26/2023 2020   LABSPEC 1.012 10/26/2023 2020   PHURINE 5.0 10/26/2023 2020   GLUCOSEU NEGATIVE 10/26/2023 2020   HGBUR NEGATIVE 10/26/2023 2020   BILIRUBINUR NEGATIVE 10/26/2023 2020   KETONESUR NEGATIVE 10/26/2023 2020   PROTEINUR NEGATIVE 10/26/2023 2020   UROBILINOGEN 0.2 02/08/2013 1605   NITRITE NEGATIVE 10/26/2023 2020   LEUKOCYTESUR NEGATIVE 10/26/2023 2020   Sepsis Labs Recent Labs  Lab 10/27/23 0405  11/02/23 0429  WBC 8.3 13.9*   Microbiology Recent Results (from the past 240 hours)  Resp panel by RT-PCR (RSV, Flu A&B, Covid) Anterior Nasal Swab     Status: None   Collection Time: 10/26/23  9:56 AM   Specimen: Anterior Nasal Swab  Result Value Ref Range Status   SARS Coronavirus 2 by RT PCR NEGATIVE NEGATIVE Final    Comment: (NOTE) SARS-CoV-2 target nucleic acids are NOT DETECTED.  The SARS-CoV-2 RNA is generally detectable in upper respiratory specimens during the acute phase  of infection. The lowest concentration of SARS-CoV-2 viral copies this assay can detect is 138 copies/mL. A negative result does not preclude SARS-Cov-2 infection and should not be used as the sole basis for treatment or other patient management decisions. A negative result may occur with  improper specimen collection/handling, submission of specimen other than nasopharyngeal swab, presence of viral mutation(s) within the areas targeted by this assay, and inadequate number of viral copies(<138 copies/mL). A negative result must be combined with clinical observations, patient history, and epidemiological information. The expected result is Negative.  Fact Sheet for Patients:  BloggerCourse.com  Fact Sheet for Healthcare Providers:  SeriousBroker.it  This test is no t yet approved or cleared by the [place] FDA and  has been authorized for detection and/or diagnosis of SARS-CoV-2 by FDA under an Emergency Use Authorization (EUA). This EUA will remain  in effect (meaning this test can be used) for the duration of the COVID-19 declaration under Section 564(b)(1) of the Act, 21 U.S.C.section 360bbb-3(b)(1), unless the authorization is terminated  or revoked sooner.       Influenza A by PCR NEGATIVE NEGATIVE Final   Influenza B by PCR NEGATIVE NEGATIVE Final    Comment: (NOTE) The Xpert Xpress SARS-CoV-2/FLU/RSV plus assay is intended as an aid in the diagnosis of influenza from Nasopharyngeal swab specimens and should not be used as a sole basis for treatment. Nasal washings and aspirates are unacceptable for Xpert Xpress SARS-CoV-2/FLU/RSV testing.  Fact Sheet for Patients: BloggerCourse.com  Fact Sheet for Healthcare Providers: SeriousBroker.it  This test is not yet approved or cleared by the [place] FDA and has been authorized for detection and/or diagnosis of  SARS-CoV-2 by FDA under an Emergency Use Authorization (EUA). This EUA will remain in effect (meaning this test can be used) for the duration of the COVID-19 declaration under Section 564(b)(1) of the Act, 21 U.S.C. section 360bbb-3(b)(1), unless the authorization is terminated or revoked.     Resp Syncytial Virus by PCR NEGATIVE NEGATIVE Final    Comment: (NOTE) Fact Sheet for Patients: BloggerCourse.com  Fact Sheet for Healthcare Providers: SeriousBroker.it  This test is not yet approved or cleared by the [place] FDA and has been authorized for detection and/or diagnosis of SARS-CoV-2 by FDA under an Emergency Use Authorization (EUA). This EUA will remain in effect (meaning this test can be used) for the duration of the COVID-19 declaration under Section 564(b)(1) of the Act, 21 U.S.C. section 360bbb-3(b)(1), unless the authorization is terminated or revoked.  Performed at [facility], [address] [place], [place] [address]   Blood culture (routine x 2)     Status: None   Collection Time: 10/26/23 10:35 AM   Specimen: BLOOD  Result Value Ref Range Status   Specimen Description   Final    BLOOD BLOOD LEFT ARM Performed at [facility], [address] [place], [place] [address]    Special Requests   Final  BOTTLES DRAWN AEROBIC AND ANAEROBIC Blood Culture adequate volume Performed at [facility], [address], [place], [place] [address]    Culture  Setup Time   Final    NO ORGANISMS SEEN AEROBIC BOTTLE ONLY Gram Stain Report Called to,Read Back By and Verified With: A BEVERLY RN 1914 [phone] K FORSYTH PREVIOUSLY REPORTED AS: GRAM POSITIVE COCCI CORRECTED RESULTS CALLED TO: PHARMD S HALL [id number] AT 1125 BY CM    Culture   Final    NO GROWTH Performed at [facility], [address] [place], [place] [address]    Report Status 10/28/2023 FINAL  Final  Blood culture (routine x 2)     Status: None    Collection Time: 10/26/23 10:35 AM   Specimen: BLOOD  Result Value Ref Range Status   Specimen Description BLOOD BLOOD RIGHT ARM AEROBIC BOTTLE ONLY  Final   Special Requests   Final    Blood Culture adequate volume BOTTLES DRAWN AEROBIC ONLY   Culture   Final    NO GROWTH 5 DAYS Performed at [facility], [address], [place], [place] [address]    Report Status 10/31/2023 FINAL  Final  Blood Culture ID Panel (Reflexed)     Status: None   Collection Time: 10/26/23 10:35 AM  Result Value Ref Range Status   Enterococcus faecalis NOT DETECTED NOT DETECTED Final   Enterococcus Faecium NOT DETECTED NOT DETECTED Final   Listeria monocytogenes NOT DETECTED NOT DETECTED Final   Staphylococcus species NOT DETECTED NOT DETECTED Final   Staphylococcus aureus (BCID) NOT DETECTED NOT DETECTED Final   Staphylococcus epidermidis NOT DETECTED NOT DETECTED Final   Staphylococcus lugdunensis NOT DETECTED NOT DETECTED Final   Streptococcus species NOT DETECTED NOT DETECTED Final   Streptococcus agalactiae NOT DETECTED NOT DETECTED Final   Streptococcus pneumoniae NOT DETECTED NOT DETECTED Final   Streptococcus pyogenes NOT DETECTED NOT DETECTED Final   A.calcoaceticus-baumannii NOT DETECTED NOT DETECTED Final   Bacteroides fragilis NOT DETECTED NOT DETECTED Final   Enterobacterales NOT DETECTED NOT DETECTED Final   Enterobacter cloacae complex NOT DETECTED NOT DETECTED Final   Escherichia coli NOT DETECTED NOT DETECTED Final   Klebsiella aerogenes NOT DETECTED NOT DETECTED Final   Klebsiella oxytoca NOT DETECTED NOT DETECTED Final   Klebsiella pneumoniae NOT DETECTED NOT DETECTED Final   Proteus species NOT DETECTED NOT DETECTED Final   Salmonella species NOT DETECTED NOT DETECTED Final   Serratia marcescens NOT DETECTED NOT DETECTED Final   Haemophilus influenzae NOT DETECTED NOT DETECTED Final   Neisseria meningitidis NOT DETECTED NOT DETECTED Final   Pseudomonas aeruginosa NOT DETECTED NOT  DETECTED Final   Stenotrophomonas maltophilia NOT DETECTED NOT DETECTED Final   Candida albicans NOT DETECTED NOT DETECTED Final   Candida auris NOT DETECTED NOT DETECTED Final   Candida glabrata NOT DETECTED NOT DETECTED Final   Candida krusei NOT DETECTED NOT DETECTED Final   Candida parapsilosis NOT DETECTED NOT DETECTED Final   Candida tropicalis NOT DETECTED NOT DETECTED Final   Cryptococcus neoformans/gattii NOT DETECTED NOT DETECTED Final    Comment: Performed at [facility], [address], [place], [place] [address]  Culture, blood (Routine X 2) w Reflex to ID Panel     Status: None   Collection Time: 10/27/23  2:17 PM   Specimen: BLOOD  Result Value Ref Range Status   Specimen Description BLOOD BLOOD RIGHT ARM  Final   Special Requests   Final    Blood Culture adequate  volume BOTTLES DRAWN AEROBIC ONLY   Culture   Final    NO GROWTH 5 DAYS Performed at [facility], [address], [place], [place] [address]    Report Status 11/01/2023 FINAL  Final  Culture, blood (Routine X 2) w Reflex to ID Panel     Status: None   Collection Time: 10/27/23  2:22 PM   Specimen: BLOOD  Result Value Ref Range Status   Specimen Description BLOOD BLOOD LEFT HAND  Final   Special Requests   Final    Blood Culture results may not be optimal due to an inadequate volume of blood received in culture bottles BOTTLES DRAWN AEROBIC ONLY   Culture   Final    NO GROWTH 5 DAYS Performed at [facility], [address], [place], [place] [address]    Report Status 11/01/2023 FINAL  Final     Time coordinating discharge: Over 30 minutes  SIGNED:   Willeen Niece, MD  [facility] 11/02/2023, 11:42 AM Pager   If 7PM-7AM, please contact night-coverage

## 2023-11-02 NOTE — Plan of Care (Signed)
Adequate for discharge.

## 2023-11-02 NOTE — Progress Notes (Signed)
CVAD removed per protocol per MD order. Manual pressure applied for 3 mins. Vaseline gauze, gauze, and Tegaderm applied over insertion site. No bleeding or swelling noted. Instructed patient to remain in bed for thirty mins. Educated patient about S/S of infection and when to call MD; no heavy lifting or pressure on R side for 24 hours; keep dressing dry and intact for 24 hours. Pt verbalized comprehension.

## 2023-11-02 NOTE — Progress Notes (Signed)
Physical Therapy Treatment Patient Details Name: Rick Williams MRN: [id number] DOB: 1966-07-29 Today's Date: 11/02/2023   History of Present Illness 58 y.o. male presents to [facility] hospital on 10/28/2023 as a transfer from [facility] with concern for advanced heart failure. RHC on 2/7. PMH includes: HTN, CVAs, R basal ganglia ICH, and alcohol abuse.    PT Comments  Pt increased ambulatory distance this session, ambulating ~165ft with CGA and RW/HHA. He required a seated rest break d/t fatigue and BLE weakness. On the second bout removed RW to assess pt's gait ability. He was slightly more unsteady and decreased his gait speed further. Pt demonstrated multiple gait abnormalities with limited ability to adjust with VC/TC. Pt will continue benefit from acute skilled PT to increase his independence and safety with mobility to allow discharge.      If plan is discharge home, recommend the following: A little help with walking and/or transfers;A little help with bathing/dressing/bathroom;Assistance with cooking/housework;Assist for transportation;Help with stairs or ramp for entrance   Can travel by private vehicle        Equipment Recommendations  Rolling walker (2 wheels)    Recommendations for Other Services       Precautions / Restrictions Precautions Precautions: Fall Restrictions Weight Bearing Restrictions Per Provider Order: No     Mobility  Bed Mobility Overal bed mobility: Modified Independent                  Transfers Overall transfer level: Needs assistance Equipment used: Rolling walker (2 wheels) Transfers: Sit to/from Stand Sit to Stand: Contact guard assist           General transfer comment: Pt demonstrated proper hand placement on RW. Good eccentric control with sitting.    Ambulation/Gait Ambulation/Gait assistance: Contact guard assist Gait Distance (Feet): 100 Feet (x2 with seated rest in between bouts) Assistive device: Rolling walker (2  wheels), 1 person hand held assist Gait Pattern/deviations: Decreased stride length, Narrow base of support, Step-to pattern Gait velocity: Decreased Gait velocity interpretation: <1.31 ft/sec, indicative of household ambulator   General Gait Details: Pt ambulated with short slow steps with feet close together and hunched over RW. VC to correct posture and increase BOS to shoulder width apart with limited correction noted. Pt reports his decreseased step length is baseline. Pt used RW on the first bout and HHA on the second bout.   Stairs             Wheelchair Mobility     Tilt Bed    Modified Rankin (Stroke Patients Only)       Balance Overall balance assessment: Mild deficits observed, not formally tested (Increased unsteadiness noted when RW was removed)                                          Communication Communication Communication: No apparent difficulties  Cognition Arousal: Alert Behavior During Therapy: WFL for tasks assessed/performed   PT - Cognitive impairments: No apparent impairments                         Following commands: Intact      Cueing Cueing Techniques: Verbal cues  Exercises      General Comments General comments (skin integrity, edema, etc.): Pt greeted with [place] off, SpO2 100% at rest. During ambulated on RA, SpO2 >88% with cues for  PLB.      Pertinent Vitals/Pain Pain Assessment Pain Assessment: No/denies pain    Home Living Family/patient expects to be discharged to:: [facility]                        Prior Function            PT Goals (current goals can now be found in the care plan section) Acute Rehab PT Goals Patient Stated Goal: Be confident with moving around Progress towards PT goals: Progressing toward goals    Frequency    Min 1X/week      PT Plan      Co-evaluation              AM-PAC PT "6 Clicks" Mobility   Outcome Measure  Help needed turning  from your back to your side while in a flat bed without using bedrails?: A Little Help needed moving from lying on your back to sitting on the side of a flat bed without using bedrails?: A Little Help needed moving to and from a bed to a chair (including a wheelchair)?: A Little Help needed standing up from a chair using your arms (e.g., wheelchair or bedside chair)?: A Little Help needed to walk in hospital room?: A Little Help needed climbing 3-5 steps with a railing? : A Lot 6 Click Score: 17    End of Session Equipment Utilized During Treatment: Gait belt Activity Tolerance: Patient limited by fatigue Patient left: in bed;with call bell/phone within reach;with nursing/sitter in room Nurse Communication: Mobility status PT Visit Diagnosis: Other abnormalities of gait and mobility (R26.89);Muscle weakness (generalized) (M62.81)     Time: 1610-9604 PT Time Calculation (min) (ACUTE ONLY): 21 min  Charges:    $Gait Training: 8-22 mins PT General Charges $$ ACUTE PT VISIT: 1 Visit                     Cheri Guppy, PT, DPT Acute Rehabilitation Services Office: [phone] Secure Chat Preferred    Richardson Chiquito 11/02/2023, 2:51 PM

## 2023-11-02 NOTE — Plan of Care (Signed)

## 2023-11-02 NOTE — Discharge Instructions (Signed)
Advised to follow-up with advanced heart failure team follow-up in 1 week. Advised medication compliance with the patient.

## 2023-11-02 NOTE — TOC Transition Note (Addendum)
Transition of Care [facility]) - Discharge Note   Patient Details  Name: [name] MRN: [id number] Date of Birth: 1966/09/05  [facility] CM/SW Contact:  Leone Haven, RN Phone Number: 11/02/2023, 11:20 AM   Clinical Narrative:    Patient is for dc today [facility],  per Lurena Joiner she states Dr.Ahmed will need the dc summary and the printed scripts faxed to him at [phone] and I will also need to fax to Lurena Joiner at [phone].  Dr. Tasia Catchings called and said  he can come on to the prison, he has received the dc summary and scripts.     Barriers to Discharge: Continued Medical Work up   Patient Goals and CMS Choice            Discharge Placement                       Discharge Plan and Services Additional resources added to the After Visit Summary for     Discharge Planning Services: CM Consult                                 Social Drivers of Health (SDOH) Interventions SDOH Screenings   Food Insecurity: Patient Declined (10/26/2023)  Housing: Unknown (10/26/2023)  Transportation Needs: No Transportation Needs (10/26/2023)  Utilities: Patient Declined (10/26/2023)  Tobacco Use: High Risk (10/26/2023)     Readmission Risk Interventions    10/27/2023    1:29 PM  Readmission Risk Prevention Plan  Transportation Screening Complete  Home Care Screening Complete  Medication Review (RN CM) Complete

## 2023-11-10 ENCOUNTER — Telehealth (HOSPITAL_COMMUNITY): Payer: Self-pay | Admitting: Cardiology

## 2023-11-10 NOTE — Telephone Encounter (Signed)
 Called patients facility ([facility]) at [phone] to remind facility/nurse of patients appointment on tomorrow 11/11/23 with Dr. Gasper Lloyd at 9:40 AM.   [facility] office was unable to speak directly with detention center / nurse (music just played while front office was left on hold for minutes).   Unable to complete reminder call for f/u appt on 11/11/23

## 2023-11-11 ENCOUNTER — Encounter (HOSPITAL_COMMUNITY): Admitting: Cardiology

## 2024-01-19 DEATH — deceased
